# Patient Record
Sex: Male | Born: 1945 | Race: Black or African American | Hispanic: No | Marital: Married | Smoking: Former smoker
Health system: Southern US, Community
[De-identification: ages and names within clinical notes are randomized; demographics above are authoritative.]

## PROBLEM LIST (undated history)

## (undated) DIAGNOSIS — G7000 Myasthenia gravis without (acute) exacerbation: Secondary | ICD-10-CM

## (undated) DIAGNOSIS — I679 Cerebrovascular disease, unspecified: Secondary | ICD-10-CM

## (undated) DIAGNOSIS — G20 Parkinson's disease: Secondary | ICD-10-CM

## (undated) DIAGNOSIS — G2581 Restless legs syndrome: Secondary | ICD-10-CM

## (undated) DIAGNOSIS — G4730 Sleep apnea, unspecified: Secondary | ICD-10-CM

## (undated) DIAGNOSIS — I10 Essential (primary) hypertension: Secondary | ICD-10-CM

## (undated) DIAGNOSIS — E785 Hyperlipidemia, unspecified: Secondary | ICD-10-CM

## (undated) DIAGNOSIS — G20A1 Parkinson's disease without dyskinesia, without mention of fluctuations: Secondary | ICD-10-CM

## (undated) DIAGNOSIS — K5900 Constipation, unspecified: Secondary | ICD-10-CM

## (undated) DIAGNOSIS — I639 Cerebral infarction, unspecified: Secondary | ICD-10-CM

## (undated) DIAGNOSIS — I714 Abdominal aortic aneurysm, without rupture, unspecified: Secondary | ICD-10-CM

## (undated) DIAGNOSIS — R269 Unspecified abnormalities of gait and mobility: Secondary | ICD-10-CM

## (undated) DIAGNOSIS — M5417 Radiculopathy, lumbosacral region: Secondary | ICD-10-CM

## (undated) DIAGNOSIS — R42 Dizziness and giddiness: Secondary | ICD-10-CM

## (undated) DIAGNOSIS — E669 Obesity, unspecified: Secondary | ICD-10-CM

## (undated) HISTORY — DX: Constipation, unspecified: K59.00

## (undated) HISTORY — DX: Hyperlipidemia, unspecified: E78.5

## (undated) HISTORY — DX: Unspecified abnormalities of gait and mobility: R26.9

## (undated) HISTORY — DX: Restless legs syndrome: G25.81

## (undated) HISTORY — PX: APPENDECTOMY: SHX54

## (undated) HISTORY — DX: Cerebrovascular disease, unspecified: I67.9

## (undated) HISTORY — DX: Obesity, unspecified: E66.9

## (undated) HISTORY — DX: Dizziness and giddiness: R42

## (undated) HISTORY — DX: Radiculopathy, lumbosacral region: M54.17

## (undated) HISTORY — DX: Myasthenia gravis without (acute) exacerbation: G70.00

## (undated) HISTORY — DX: Abdominal aortic aneurysm, without rupture, unspecified: I71.40

## (undated) HISTORY — DX: Abdominal aortic aneurysm, without rupture: I71.4

---

## 1975-08-05 HISTORY — PX: FEMUR FRACTURE SURGERY: SHX633

## 1998-07-31 ENCOUNTER — Ambulatory Visit (HOSPITAL_COMMUNITY): Admission: RE | Admit: 1998-07-31 | Discharge: 1998-07-31 | Payer: Self-pay | Admitting: *Deleted

## 1999-05-30 ENCOUNTER — Ambulatory Visit (HOSPITAL_COMMUNITY): Admission: RE | Admit: 1999-05-30 | Discharge: 1999-05-30 | Payer: Self-pay | Admitting: Cardiology

## 1999-05-30 ENCOUNTER — Encounter: Payer: Self-pay | Admitting: Cardiology

## 2000-03-19 ENCOUNTER — Encounter: Payer: Self-pay | Admitting: Cardiology

## 2000-03-19 ENCOUNTER — Encounter: Admission: RE | Admit: 2000-03-19 | Discharge: 2000-03-19 | Payer: Self-pay | Admitting: Cardiology

## 2001-06-25 ENCOUNTER — Encounter: Admission: RE | Admit: 2001-06-25 | Discharge: 2001-06-25 | Payer: Self-pay | Admitting: Urology

## 2001-06-25 ENCOUNTER — Encounter: Payer: Self-pay | Admitting: Urology

## 2002-05-11 ENCOUNTER — Encounter: Payer: Self-pay | Admitting: Cardiology

## 2002-05-11 ENCOUNTER — Encounter: Admission: RE | Admit: 2002-05-11 | Discharge: 2002-05-11 | Payer: Self-pay | Admitting: Cardiology

## 2004-10-26 ENCOUNTER — Emergency Department (HOSPITAL_COMMUNITY): Admission: EM | Admit: 2004-10-26 | Discharge: 2004-10-27 | Payer: Self-pay | Admitting: Emergency Medicine

## 2007-11-18 ENCOUNTER — Encounter: Admission: RE | Admit: 2007-11-18 | Discharge: 2007-11-18 | Payer: Self-pay | Admitting: Cardiology

## 2008-01-29 ENCOUNTER — Inpatient Hospital Stay (HOSPITAL_COMMUNITY): Admission: EM | Admit: 2008-01-29 | Discharge: 2008-02-01 | Payer: Self-pay | Admitting: Emergency Medicine

## 2008-01-30 ENCOUNTER — Encounter: Payer: Self-pay | Admitting: Internal Medicine

## 2008-01-30 ENCOUNTER — Ambulatory Visit: Payer: Self-pay | Admitting: Vascular Surgery

## 2009-05-16 ENCOUNTER — Encounter: Admission: RE | Admit: 2009-05-16 | Discharge: 2009-05-16 | Payer: Self-pay | Admitting: Cardiology

## 2009-10-07 ENCOUNTER — Emergency Department (HOSPITAL_COMMUNITY): Admission: EM | Admit: 2009-10-07 | Discharge: 2009-10-07 | Payer: Self-pay | Admitting: Family Medicine

## 2009-11-08 ENCOUNTER — Encounter: Admission: RE | Admit: 2009-11-08 | Discharge: 2009-12-06 | Payer: Self-pay | Admitting: Neurology

## 2010-04-04 ENCOUNTER — Emergency Department (HOSPITAL_COMMUNITY): Admission: EM | Admit: 2010-04-04 | Discharge: 2010-04-04 | Payer: Self-pay | Admitting: Family Medicine

## 2010-10-08 ENCOUNTER — Other Ambulatory Visit: Payer: Self-pay | Admitting: Cardiology

## 2010-10-08 ENCOUNTER — Ambulatory Visit
Admission: RE | Admit: 2010-10-08 | Discharge: 2010-10-08 | Disposition: A | Discharge status: Home / Self Care | Payer: 59 | Source: Ambulatory Visit | Attending: Cardiology | Admitting: Cardiology

## 2010-10-08 DIAGNOSIS — I639 Cerebral infarction, unspecified: Secondary | ICD-10-CM

## 2010-10-08 DIAGNOSIS — I10 Essential (primary) hypertension: Secondary | ICD-10-CM

## 2010-10-29 ENCOUNTER — Other Ambulatory Visit: Payer: Self-pay | Admitting: Gastroenterology

## 2010-12-17 NOTE — Consult Note (Signed)
[name], [name]                   ACCOUNT NO.:  [id number]   MEDICAL RECORD NO.:  [id number]          PATIENT TYPE:  INP   LOCATION:  3006                         FACILITY:  [facility]   PHYSICIAN:  Derek Harrell, M.D.  DATE OF BIRTH:  15-Feb-1946   DATE OF CONSULTATION:  01/31/2008  DATE OF DISCHARGE:                                 CONSULTATION   HISTORY OF PRESENT ILLNESS:  Derek Harrell is a 65 year old, right-handed  black male, born on Jul 05, 1946 with a history of hypertension and  dyslipidemia.  This patient was in his usual state of health until January 28, 2008.  The patient awakened feeling somewhat dizzy and felt  sluggish.  The patient went to work but was noted to have slurred speech  and was told to go back home.  The patient laid down and rested that [place]  but daughter persisted he go see a doctor.  The patient was seen by his  doctor and was sent to the emergency room for an evaluation and was  admitted for evaluation of stroke.  MRI scan of the brain has confirmed  evidence of a right paramedian pontine stroke event.  The patient has  had an echocardiogram revealing 55%-65% ejection fraction, otherwise  unremarkable.  Carotid Doppler study was unremarkable.  The patient has  been placed on Aggrenox and is on medications for blood pressure.  The  patient at this point feels completely normal.  He does not feel like he  has any residual deficits.  Neurology was asked to see the patient for  further evaluation.   PAST MEDICAL HISTORY:  1. New-onset right paramedian pontine infarct.  2. Hypertension.  3. Dyslipidemia.  4. Right femur fracture status post surgery.  5. Appendectomy.   ALLERGIES:  The patient has no known allergies.   He does not smoke.  Drinks alcohol on occasion.   CURRENT MEDICATIONS:  At this point include:  1. Tekturna 300 mg daily.  2. Norvasc 10 mg daily.  3. Aggrenox 1 twice daily.  4. Hydrochlorothiazide 25 mg daily.  5. Lopressor 12.5 mg  twice daily.  6. Benicar 40 mg daily.  7. Potassium 20 mEq twice daily.  8. Crestor 5 mg daily.  9. Tylenol if needed.  10.Catapres 0.1 mg every 2 hours if needed.   The patient was not on antiplatelet agents prior to admission.   SOCIAL HISTORY:  The patient is single/divorced.  The patient [place] in  the [place] area.  He works for [employer] and is  currently working.  The patient has 3 children who are alive and well.   FAMILY MEDICAL HISTORY:  Notable that mother died at age 50 of old  age.  Father died with MI.  The patient's one brother died with an MI.  Four living brothers, one with colon cancer and one living sister who is  in good health.   REVIEW OF SYSTEMS:  Notable for no recent fevers or chills.  The patient  denies headache, neck pain, shortness of breath, palpitations of the  heart,  chest pains, abdominal pain, nausea, or vomiting.  He denies  problems controlling bowels or bladder.  He denies any blackout  episodes,  seizure-type events, or focal numbness or weakness on the  face, arms, or legs.   PHYSICAL EXAMINATION:  VITAL SIGNS:  Blood pressure currently is 152/93,  heart rate 68, respiratory 20, and temperature afebrile.  GENERAL:  This patient is a fairly well-developed black male who is  alert and cooperative at the time of examination.  HEENT:  Head is atraumatic.  Eyes; pupils are equal, round, and react to  light.  Disks are flat bilaterally.  NECK:  Supple.  No carotid bruits noted.  RESPIRATORY:  Clear.  CARDIOVASCULAR:  Regular rate and rhythm.  No obvious murmurs or rubs  noted.  EXTREMITIES:  Without significant edema.  NEUROLOGIC:  Cranial nerves as above.  Facial symmetry is present.  The  patient has good sensation of face to pinprick and soft touch  bilaterally.  He has good strength of facial muscles, motion of the head  turn, and shoulder shrug bilaterally.  Speech is well enunciated, not  aphasic.  The extraocular  movements are full.  Motor testing shows 5/5  strength in all 4's.  Good symmetric motor tone is noted throughout.  Sensory testing is intact to pinprick, soft touch, and vibratory  sensation throughout.  The patient has good finger-to-nose-to-finger and  heel-to-shin.  Gait was not tested.  No drift is seen in the arms or  legs.  Deep tendon reflexes reveal slight elevation of left biceps  reflex, the right otherwise symmetric throughout.  Toes are neutral  bilaterally.  No evidence of extinction is noted.   NIH stroke scale score is 0.   LABORATORY VALUES:  Notable for sodium 141, potassium 3.6, chloride of  104, CO2 of 25, glucose 101, BUN of 11, creatinine 0.76, total bilirubin  of 1.0, alkaline phosphatase is 43, SGOT of 15, SGPT of 11, albumin 3.2,  calcium of 8.7, cholesterol 202, triglycerides of 72, HDL of 31, LDL of  157, and VLDL of 14.  PSA of 1.47.   MRI study is as above.  The patient does have some small vessel ischemic  changes that are chronic over and above this.  MRI angiogram was not  done.   IMPRESSION:  1. Right paramedian pontine infarct.  2. Hypertension.  3. Dyslipidemia.   This patient has high blood pressure as the main risk factor.  The  patient is a remote smoker but does not currently smoke.  Infarcts in  this area oftentimes due to hypertensive changes with vessel disease.  Needs to pursue some evaluation of the vertebrobasilar system which has  not yet been done.   PLAN:  1. MRI angiogram of the intracranial and extracranial vessels.  2. Okay to continue Aggrenox, but the patient was on no antiplatelet      agents prior to admission.  The patient to just go on aspirin upon      discharge.  We will follow this patient while in-house.  The      patient is not a TPA candidate due to duration of symptoms and      minimal deficit.      Derek Harrell, M.D.  Electronically Signed     CKW/MEDQ  D:  01/31/2008  T:  02/01/2008  Job:  [id number]    cc:   Osvaldo Shipper. Spruill, M.D.  [facility]

## 2010-12-17 NOTE — H&P (Signed)
Derek Harrell, Derek Harrell                   ACCOUNT NO.:  [id number]   MEDICAL RECORD NO.:  [id number]          PATIENT TYPE:  INP   LOCATION:  3304                         FACILITY:  [facility]   PHYSICIAN:  Eric L. August Saucer, M.D.     DATE OF BIRTH:  August 15, 1945   DATE OF ADMISSION:  01/28/2008  DATE OF DISCHARGE:                              HISTORY & PHYSICAL   CHIEF COMPLAINT:  New-onset weakness with slurred speech, rule out CVA.   HISTORY OF PRESENT ILLNESS:  First recent [facility] admission  for this 64 year old single black male with previous document history of  hypertension of unknown duration.  He states he had been doing well up  until yesterday when he first began to feel strange in his head.  This  is a mild right-headed sensation.  There was no associated headache.  No  dizziness.  Today, he awakened feeling much worse.  He had some  difficulty concentrating.  The family noted that he had slurring of the  speech as well.  He during that time had more weakness in the left upper  extremity and possibly at the left lower extremity as well.  The patient  did not go to the emergency room for evaluation initially but they come  in later this evening for further care.   PAST MEDICAL HISTORY:  He was notable for hypertension with marked  elevation in his pain seen on documentations previously.  He states that  he had been taking clonidine for several years.  He states he just  recently started on the Tekturna and Exforge within the past 3 weeks.   ALLERGIES:  The patient has no known allergies.   He previously smoked for 1-1/2 pack of cigarettes a Payes, but stopped 15  years ago.  No alcohol intake as well.   PREVIOUS SURGERIES:  Appendectomy and a right leg fracture.   PRESENT MEDICATIONS:  1. Clonidine 0.2 mg nightly.  2. Tekturna 300/25 one daily.  3. Exforge 10 mg daily.   PHYSICAL EXAMINATION:  GENERAL: He is a well-developed, well-nourished  black male apparently in no  acute distress.  VITAL SIGNS: Reveal a blood pressure of 186/104 with initial  presentation of 192/113, pulse 64, and respiratory rate 18.  He is  afebrile.  HEENT: Head normocephalic, atraumatic without bruits.  Extraocular  muscles are intact without diplopia noted.  Pupils are equal and  reactive to light.  There was no sinus tenderness noted.  TMs are clear.  Posterior pharynx is clear.  Tongue is midline.  LUNGS: Clear to auscultation.  No E to A changes.  CARDIOVASCULAR: Normal S1 and S2.  No S3.  1/6 systolic ejection murmur  heard loudest in the left sternal border.  No carotid bruits could be  appreciated on exam today.  ABDOMEN: Bowel sounds present.  No masses.  EXTREMITIES: Trace edema and negative Homans bilaterally.  He does  appear to have decreased dorsiflexion in the left foot versus right.  There is a mild decreasing grasp with the left hand versus right as  well.  The patient notably does serial 3s poorly despite stating that he was  good at maths.  He has a mild left upper extremity drift on testing.  Decreased dorsiflexion left foot versus right.  Absent Babinski's.   LABORATORY DATA:  CBC revealed WBC of 7800, hemoglobin 13.4, hematocrit  of 40.  Normal differential.  Electrolytes, sodium 142, potassium  borderline low at 0.5, chloride 109, CO2 of 10, creatinine of 1.0,  glucose of 94, and calcium 1.07.  Ionized hemoglobin 13.3, hematocrit of  39.0.   CT scan of head demonstrated chronic small vessel disease without any  acute intracranial abnormality.   IMPRESSION:  1. Altered mental status with slurred speech, rule out cerebrovascular      accident versus transient ischemic attack versus reversible      ischemic neurologic deficit.  By history is presently improved.  2. Accelerated hypertension.  Unclear history of how long his blood      pressure has been in this range.  All previous records known shows      elevated blood pressure at that time as well.   The patient recently      started on 2 medications.  3. Distant history of tobacco abuse.  It is unknown what his lipid      status is as well.   PLAN:  The patient is admitted for further treatment for transient  ischemic attack versus cerebrovascular accident.  He will need an MRI  scan of his head to document for any deficits.  We will maintain him on  aspirin therapy as well as his previous blood pressure medicine.  We  used clonidine for more acute elevations.  We will have the patient  evaluated by Neuro and the Stroke Team as well.  We will watch the  patient closely in ICU/Stepdown for the first 24 hours with transfer to  the floor thereafter.           ______________________________  Lind Guest. August Saucer, M.D.     ELD/MEDQ  D:  01/29/2008  T:  01/29/2008  Job:  [id number]

## 2010-12-20 NOTE — Discharge Summary (Signed)
Derek Harrell, Derek Harrell                   ACCOUNT NO.:  [id number]   MEDICAL RECORD NO.:  [id number]         PATIENT TYPE:  CINP   LOCATION:                               FACILITY:  [facility]   PHYSICIAN:  Osvaldo Shipper. Spruill, M.D.DATE OF BIRTH:  1945/11/01   DATE OF ADMISSION:  01/29/2008  DATE OF DISCHARGE:  02/01/2008                               DISCHARGE SUMMARY   DISCHARGE DIAGNOSES:  1. Cerebral artery occlusion with cerebral infarction.  2. Peripheral vascular disease.  3. Hypertension.  4. History of tobacco use.  5. Hyperlipidemia.   Derek Harrell is a 65 year old patient who presented to the [facility] Emergency Department on January 28, 2008 with stroke symptoms.  The patient reported that he woke up feeling weak.  He reported for his  work duty at approximately 7:59 in the morning, and coworkers noticed  that his speech was somewhat slurred.  His daughter spoke with the  patient approximately at 9:30 in the morning, and the patient was again  having slurred speech that was worse at that particular time.  Family  also reported that the patient's ambulation was somewhat slow.   The patient was subsequently admitted due to his altered mental status.  The admission history and physical was completed by Dr. Willey Blade.  The  daily visits were completed by Dr. Donia Guiles.  It is of note that  the initial blood pressure for this patient was initial was 192/113 and  ranged to 186/104.  The patient did serial 3's poorly on his neurologic  examination.  He had a mild left upper extremity drift on testing.  There was decreased dorsiflexion of the left foot versus the right.   Plans were made for the patient to have an MRI which revealed an acute  nonhemorrhagic left paracentral pontine infarct.   At this point, the patient was seen by the stroke team.  The patient  also underwent carotid Doppler study, and there was no internal carotid  artery stenosis.  Vertebral artery flow was  antegrade.   A neurology consultation was obtained and it was their opinion that the  patient had indeed sustained a right pontine cerebrovascular accident  with ataxia.  The patient was placed on Aggrenox.  The neurology  consultants were in agreement with this.  The patient continued to show  improvement without major complications, and plans were made for the  patient to be seen in the office in 2 weeks.   Medications at discharge include clonidine 0.1 mg at bedtime, potassium  20 mEq daily, Crestor 10 mg daily, Bystolic 10 mg daily, Aggrenox 1  twice a Dengel, Tekturna 300 mg daily, and Exforge HCT 10/320/25 one daily.   The patient is to be seen by Dr. Anne Hahn in 2 months (neurology), to be  seen by Dr. Shana Chute in 2 weeks, sooner if any changes, problems, or  concerns.      Derek Harrell, P.A.       ______________________________  Osvaldo Shipper. Spruill, M.D.    HB/MEDQ  D:  03/02/2008  T:  03/03/2008  Job:  [id number]

## 2011-05-01 LAB — POCT I-STAT, CHEM 8
BUN: 10
Calcium, Ion: 1.07 — ABNORMAL LOW
Chloride: 109
Glucose, Bld: 94
HCT: 39
TCO2: 23

## 2011-05-01 LAB — BASIC METABOLIC PANEL
CO2: 25
Chloride: 104
Creatinine, Ser: 0.76
GFR calc Af Amer: >60
Potassium: 3.6
Sodium: 141

## 2011-05-01 LAB — LIPID PANEL
Cholesterol: 227 — ABNORMAL HIGH
LDL Cholesterol: 166 — ABNORMAL HIGH
Triglycerides: 105
VLDL: 14

## 2011-05-01 LAB — COMPREHENSIVE METABOLIC PANEL
ALT: 11
AST: 15
CO2: 27
Chloride: 104
GFR calc Af Amer: >60
GFR calc non Af Amer: >60
Glucose, Bld: 109 — ABNORMAL HIGH
Sodium: 139
Total Bilirubin: 1

## 2011-05-01 LAB — CBC
HCT: 40
Hemoglobin: 13.4
MCHC: 33.6
RBC: 4.93
RDW: 13.9

## 2011-05-01 LAB — DIFFERENTIAL
Basophils Absolute: 0
Basophils Relative: 0
Eosinophils Relative: 5
Monocytes Absolute: 0.5
Monocytes Relative: 7
Neutro Abs: 4.6

## 2011-05-01 LAB — B-NATRIURETIC PEPTIDE (CONVERTED LAB): Pro B Natriuretic peptide (BNP): <30

## 2011-05-01 LAB — PSA: PSA: 1.47

## 2011-05-01 LAB — SEDIMENTATION RATE: Sed Rate: 3

## 2011-05-01 LAB — C-REACTIVE PROTEIN: CRP: 0.2 — ABNORMAL LOW (ref <0.6)

## 2011-05-09 ENCOUNTER — Emergency Department (HOSPITAL_COMMUNITY): Payer: 59

## 2011-05-09 ENCOUNTER — Emergency Department (HOSPITAL_COMMUNITY)
Admission: EM | Admit: 2011-05-09 | Discharge: 2011-05-09 | Disposition: A | Discharge status: Home / Self Care | Payer: 59 | Attending: Emergency Medicine | Admitting: Emergency Medicine

## 2011-05-09 DIAGNOSIS — Z79899 Other long term (current) drug therapy: Secondary | ICD-10-CM | POA: Insufficient documentation

## 2011-05-09 DIAGNOSIS — I10 Essential (primary) hypertension: Secondary | ICD-10-CM | POA: Insufficient documentation

## 2011-05-09 DIAGNOSIS — Z8673 Personal history of transient ischemic attack (TIA), and cerebral infarction without residual deficits: Secondary | ICD-10-CM | POA: Insufficient documentation

## 2011-05-09 DIAGNOSIS — F411 Generalized anxiety disorder: Secondary | ICD-10-CM | POA: Insufficient documentation

## 2011-05-09 DIAGNOSIS — R5381 Other malaise: Secondary | ICD-10-CM | POA: Insufficient documentation

## 2011-05-09 LAB — POCT I-STAT, CHEM 8
BUN: 13 mg/dL (ref 6–23)
Calcium, Ion: 1.15 mmol/L (ref 1.12–1.32)
Chloride: 107 mEq/L (ref 96–112)
Creatinine, Ser: 0.9 mg/dL (ref 0.50–1.35)
Glucose, Bld: 114 mg/dL — ABNORMAL HIGH (ref 70–99)
HCT: 46 % (ref 39.0–52.0)
Hemoglobin: 15.6 g/dL (ref 13.0–17.0)
Potassium: 4 meq/L (ref 3.5–5.1)
Sodium: 142 meq/L (ref 135–145)
TCO2: 25 mmol/L (ref 0–100)

## 2011-05-09 LAB — DIFFERENTIAL
Basophils Absolute: 0 K/uL (ref 0.0–0.1)
Basophils Relative: 1 % (ref 0–1)
Eosinophils Absolute: 0.3 10*3/uL (ref 0.0–0.7)
Eosinophils Relative: 4 % (ref 0–5)
Lymphocytes Relative: 22 % (ref 12–46)
Lymphs Abs: 1.4 10*3/uL (ref 0.7–4.0)
Monocytes Absolute: 0.4 10*3/uL (ref 0.1–1.0)
Monocytes Relative: 6 % (ref 3–12)
Neutro Abs: 4.6 K/uL (ref 1.7–7.7)
Neutrophils Relative %: 68 % (ref 43–77)

## 2011-05-09 LAB — CBC
HCT: 43.5 % (ref 39.0–52.0)
Hemoglobin: 14.9 g/dL (ref 13.0–17.0)
MCH: 26.4 pg (ref 26.0–34.0)
MCHC: 34.3 g/dL (ref 30.0–36.0)
MCV: 77 fL — ABNORMAL LOW (ref 78.0–100.0)
Platelets: 165 10*3/uL (ref 150–400)
RBC: 5.65 MIL/uL (ref 4.22–5.81)
RDW: 13.5 % (ref 11.5–15.5)
WBC: 6.7 K/uL (ref 4.0–10.5)

## 2011-06-21 ENCOUNTER — Other Ambulatory Visit: Payer: Self-pay | Admitting: Cardiology

## 2011-10-07 ENCOUNTER — Other Ambulatory Visit: Payer: Self-pay | Admitting: Cardiology

## 2011-12-17 ENCOUNTER — Other Ambulatory Visit (HOSPITAL_COMMUNITY): Payer: Self-pay | Admitting: Cardiology

## 2011-12-22 ENCOUNTER — Other Ambulatory Visit: Payer: Self-pay

## 2011-12-22 ENCOUNTER — Ambulatory Visit (HOSPITAL_COMMUNITY)
Admission: RE | Admit: 2011-12-22 | Discharge: 2011-12-22 | Disposition: A | Discharge status: Home / Self Care | Payer: 59 | Source: Ambulatory Visit | Attending: Cardiology | Admitting: Cardiology

## 2011-12-22 ENCOUNTER — Encounter (HOSPITAL_COMMUNITY)
Admission: RE | Admit: 2011-12-22 | Discharge: 2011-12-22 | Disposition: A | Discharge status: Home / Self Care | Payer: 59 | Source: Ambulatory Visit | Attending: Cardiology | Admitting: Cardiology

## 2011-12-22 DIAGNOSIS — I10 Essential (primary) hypertension: Secondary | ICD-10-CM | POA: Insufficient documentation

## 2011-12-22 DIAGNOSIS — Z8679 Personal history of other diseases of the circulatory system: Secondary | ICD-10-CM | POA: Insufficient documentation

## 2011-12-22 DIAGNOSIS — R5381 Other malaise: Secondary | ICD-10-CM | POA: Insufficient documentation

## 2011-12-22 MED ORDER — REGADENOSON 0.4 MG/5ML IV SOLN
INTRAVENOUS | Status: AC
  Filled 2011-12-22: qty 5

## 2011-12-22 MED ORDER — TECHNETIUM TC 99M TETROFOSMIN IV KIT
30.0000 | PACK | Freq: Once | INTRAVENOUS | Status: AC | PRN
  Administered 2011-12-22: 30 via INTRAVENOUS

## 2011-12-22 MED ORDER — TECHNETIUM TC 99M TETROFOSMIN IV KIT
10.0000 | PACK | Freq: Once | INTRAVENOUS | Status: AC | PRN
  Administered 2011-12-22: 10 via INTRAVENOUS

## 2011-12-22 MED ORDER — REGADENOSON 0.4 MG/5ML IV SOLN
0.4000 mg | Freq: Once | INTRAVENOUS | Status: AC
  Administered 2011-12-22: 0.4 mg via INTRAVENOUS

## 2012-09-06 ENCOUNTER — Other Ambulatory Visit: Payer: Self-pay | Admitting: Physician Assistant

## 2012-09-06 DIAGNOSIS — K469 Unspecified abdominal hernia without obstruction or gangrene: Secondary | ICD-10-CM

## 2012-09-06 DIAGNOSIS — R109 Unspecified abdominal pain: Secondary | ICD-10-CM

## 2012-09-08 ENCOUNTER — Ambulatory Visit
Admission: RE | Admit: 2012-09-08 | Discharge: 2012-09-08 | Disposition: A | Discharge status: Home / Self Care | Payer: 59 | Source: Ambulatory Visit | Attending: Physician Assistant | Admitting: Physician Assistant

## 2012-09-08 DIAGNOSIS — K469 Unspecified abdominal hernia without obstruction or gangrene: Secondary | ICD-10-CM

## 2012-09-08 DIAGNOSIS — R109 Unspecified abdominal pain: Secondary | ICD-10-CM

## 2012-09-08 MED ORDER — IOHEXOL 300 MG/ML  SOLN
125.0000 mL | Freq: Once | INTRAMUSCULAR | Status: AC | PRN
  Administered 2012-09-08: 125 mL via INTRAVENOUS

## 2012-10-05 ENCOUNTER — Other Ambulatory Visit (HOSPITAL_COMMUNITY): Payer: Self-pay | Admitting: Cardiovascular Disease

## 2012-10-05 DIAGNOSIS — I714 Abdominal aortic aneurysm, without rupture, unspecified: Secondary | ICD-10-CM

## 2012-10-29 ENCOUNTER — Encounter (HOSPITAL_COMMUNITY): Payer: Self-pay

## 2012-11-21 ENCOUNTER — Emergency Department (HOSPITAL_COMMUNITY): Payer: Medicare Other

## 2012-11-21 ENCOUNTER — Inpatient Hospital Stay (HOSPITAL_COMMUNITY)
Admission: EM | Admit: 2012-11-21 | Discharge: 2012-11-23 | DRG: 312 | Disposition: A | Discharge status: Home / Self Care | Payer: Medicare Other | Attending: Cardiology | Admitting: Cardiology

## 2012-11-21 ENCOUNTER — Encounter (HOSPITAL_COMMUNITY): Payer: Self-pay | Admitting: Emergency Medicine

## 2012-11-21 DIAGNOSIS — E7800 Pure hypercholesterolemia, unspecified: Secondary | ICD-10-CM | POA: Diagnosis present

## 2012-11-21 DIAGNOSIS — H02409 Unspecified ptosis of unspecified eyelid: Secondary | ICD-10-CM | POA: Diagnosis present

## 2012-11-21 DIAGNOSIS — R270 Ataxia, unspecified: Secondary | ICD-10-CM

## 2012-11-21 DIAGNOSIS — Z79899 Other long term (current) drug therapy: Secondary | ICD-10-CM

## 2012-11-21 DIAGNOSIS — R279 Unspecified lack of coordination: Secondary | ICD-10-CM

## 2012-11-21 DIAGNOSIS — G20 Parkinson's disease: Secondary | ICD-10-CM | POA: Diagnosis present

## 2012-11-21 DIAGNOSIS — R531 Weakness: Secondary | ICD-10-CM | POA: Diagnosis present

## 2012-11-21 DIAGNOSIS — G20A1 Parkinson's disease without dyskinesia, without mention of fluctuations: Secondary | ICD-10-CM | POA: Diagnosis present

## 2012-11-21 DIAGNOSIS — I10 Essential (primary) hypertension: Secondary | ICD-10-CM | POA: Diagnosis present

## 2012-11-21 DIAGNOSIS — Z8673 Personal history of transient ischemic attack (TIA), and cerebral infarction without residual deficits: Secondary | ICD-10-CM

## 2012-11-21 DIAGNOSIS — Z7902 Long term (current) use of antithrombotics/antiplatelets: Secondary | ICD-10-CM

## 2012-11-21 DIAGNOSIS — R55 Syncope and collapse: Principal | ICD-10-CM | POA: Diagnosis present

## 2012-11-21 DIAGNOSIS — R5381 Other malaise: Secondary | ICD-10-CM

## 2012-11-21 HISTORY — DX: Cerebral infarction, unspecified: I63.9

## 2012-11-21 HISTORY — DX: Essential (primary) hypertension: I10

## 2012-11-21 LAB — BASIC METABOLIC PANEL
BUN: 13 mg/dL (ref 6–23)
CO2: 23 mEq/L (ref 19–32)
Calcium: 9.3 mg/dL (ref 8.4–10.5)
Chloride: 105 mEq/L (ref 96–112)
Creatinine, Ser: 0.82 mg/dL (ref 0.50–1.35)
GFR calc Af Amer: >90 mL/min (ref >90)
GFR calc non Af Amer: >90 mL/min (ref >90)
Glucose, Bld: 87 mg/dL (ref 70–99)
Potassium: 4.6 mEq/L (ref 3.5–5.1)
Sodium: 139 mEq/L (ref 135–145)

## 2012-11-21 LAB — URINALYSIS, ROUTINE W REFLEX MICROSCOPIC
Bilirubin Urine: NEGATIVE
Hgb urine dipstick: NEGATIVE
Ketones, ur: NEGATIVE mg/dL
Protein, ur: NEGATIVE mg/dL
Urobilinogen, UA: 2 mg/dL — ABNORMAL HIGH (ref 0.0–1.0)

## 2012-11-21 LAB — DIFFERENTIAL
Basophils Absolute: 0 10*3/uL (ref 0.0–0.1)
Eosinophils Relative: 4 % (ref 0–5)
Lymphocytes Relative: 20 % (ref 12–46)
Monocytes Absolute: 0.7 10*3/uL (ref 0.1–1.0)
Monocytes Relative: 8 % (ref 3–12)

## 2012-11-21 LAB — CBC
HCT: 44.2 % (ref 39.0–52.0)
Hemoglobin: 15.7 g/dL (ref 13.0–17.0)
MCH: 27.1 pg (ref 26.0–34.0)
MCHC: 35.5 g/dL (ref 30.0–36.0)
MCV: 76.3 fL — ABNORMAL LOW (ref 78.0–100.0)
Platelets: 152 10*3/uL (ref 150–400)
RBC: 5.79 MIL/uL (ref 4.22–5.81)
RDW: 13.4 % (ref 11.5–15.5)
WBC: 8.4 10*3/uL (ref 4.0–10.5)

## 2012-11-21 LAB — POCT I-STAT, CHEM 8
BUN: 14 mg/dL (ref 6–23)
Calcium, Ion: 1.17 mmol/L (ref 1.13–1.30)
Chloride: 106 mEq/L (ref 96–112)
Creatinine, Ser: 0.8 mg/dL (ref 0.50–1.35)
Glucose, Bld: 88 mg/dL (ref 70–99)
HCT: 47 % (ref 39.0–52.0)

## 2012-11-21 LAB — RAPID URINE DRUG SCREEN, HOSP PERFORMED
Amphetamines: NOT DETECTED
Benzodiazepines: NOT DETECTED
Opiates: NOT DETECTED
Tetrahydrocannabinol: NOT DETECTED

## 2012-11-21 MED ORDER — SODIUM CHLORIDE 0.9 % IJ SOLN
3.0000 mL | Freq: Two times a day (BID) | INTRAMUSCULAR | Status: DC
  Administered 2012-11-21 – 2012-11-22 (×2): 3 mL via INTRAVENOUS

## 2012-11-21 MED ORDER — ATORVASTATIN CALCIUM 40 MG PO TABS
40.0000 mg | ORAL_TABLET | Freq: Every day | ORAL | Status: DC
  Administered 2012-11-21 – 2012-11-22 (×2): 40 mg via ORAL
  Filled 2012-11-21 (×4): qty 1

## 2012-11-21 MED ORDER — ONDANSETRON HCL 4 MG/2ML IJ SOLN: 4.0000 mg | Freq: Four times a day (QID) | INTRAMUSCULAR | Status: DC | PRN

## 2012-11-21 MED ORDER — ACETAMINOPHEN 650 MG RE SUPP: 650.0000 mg | Freq: Four times a day (QID) | RECTAL | Status: DC | PRN

## 2012-11-21 MED ORDER — ACETAMINOPHEN 325 MG PO TABS: 650.0000 mg | ORAL_TABLET | Freq: Four times a day (QID) | ORAL | Status: DC | PRN

## 2012-11-21 MED ORDER — HEPARIN SODIUM (PORCINE) 5000 UNIT/ML IJ SOLN
5000.0000 [IU] | Freq: Three times a day (TID) | INTRAMUSCULAR | Status: DC
  Administered 2012-11-21 – 2012-11-23 (×5): 5000 [IU] via SUBCUTANEOUS
  Filled 2012-11-21 (×8): qty 1

## 2012-11-21 MED ORDER — SODIUM CHLORIDE 0.9 % IV SOLN: 250.0000 mL | INTRAVENOUS | Status: DC | PRN

## 2012-11-21 MED ORDER — ADULT MULTIVITAMIN W/MINERALS CH
1.0000 | ORAL_TABLET | Freq: Every day | ORAL | Status: DC
  Administered 2012-11-22 – 2012-11-23 (×2): 1 via ORAL
  Filled 2012-11-21 (×2): qty 1

## 2012-11-21 MED ORDER — NEBIVOLOL HCL 10 MG PO TABS
10.0000 mg | ORAL_TABLET | Freq: Every day | ORAL | Status: DC
  Administered 2012-11-22 – 2012-11-23 (×2): 10 mg via ORAL
  Filled 2012-11-21 (×3): qty 1

## 2012-11-21 MED ORDER — ALUM & MAG HYDROXIDE-SIMETH 200-200-20 MG/5ML PO SUSP: 30.0000 mL | Freq: Four times a day (QID) | ORAL | Status: DC | PRN

## 2012-11-21 MED ORDER — CLOPIDOGREL BISULFATE 75 MG PO TABS
75.0000 mg | ORAL_TABLET | Freq: Every day | ORAL | Status: DC
  Administered 2012-11-22 – 2012-11-23 (×2): 75 mg via ORAL
  Filled 2012-11-21 (×3): qty 1

## 2012-11-21 MED ORDER — SODIUM CHLORIDE 0.9 % IJ SOLN
3.0000 mL | Freq: Two times a day (BID) | INTRAMUSCULAR | Status: DC
  Administered 2012-11-21 – 2012-11-23 (×3): 3 mL via INTRAVENOUS

## 2012-11-21 MED ORDER — ONDANSETRON HCL 4 MG PO TABS: 4.0000 mg | ORAL_TABLET | Freq: Four times a day (QID) | ORAL | Status: DC | PRN

## 2012-11-21 MED ORDER — ASPIRIN EC 325 MG PO TBEC
325.0000 mg | DELAYED_RELEASE_TABLET | Freq: Every day | ORAL | Status: DC
  Administered 2012-11-22 – 2012-11-23 (×2): 325 mg via ORAL
  Filled 2012-11-21 (×2): qty 1

## 2012-11-21 MED ORDER — CLONIDINE HCL 0.1 MG PO TABS
0.1000 mg | ORAL_TABLET | Freq: Every day | ORAL | Status: DC
  Administered 2012-11-21 – 2012-11-22 (×2): 0.1 mg via ORAL
  Filled 2012-11-21 (×4): qty 1

## 2012-11-21 MED ORDER — DOCUSATE SODIUM 100 MG PO CAPS
100.0000 mg | ORAL_CAPSULE | Freq: Two times a day (BID) | ORAL | Status: DC
  Administered 2012-11-21 – 2012-11-22 (×3): 100 mg via ORAL
  Filled 2012-11-21 (×3): qty 1

## 2012-11-21 MED ORDER — SODIUM CHLORIDE 0.9 % IJ SOLN: 3.0000 mL | INTRAMUSCULAR | Status: DC | PRN

## 2012-11-21 MED ORDER — AMLODIPINE BESYLATE 5 MG PO TABS
5.0000 mg | ORAL_TABLET | Freq: Every day | ORAL | Status: DC
  Administered 2012-11-22 – 2012-11-23 (×2): 5 mg via ORAL
  Filled 2012-11-21 (×3): qty 1

## 2012-11-21 NOTE — ED Provider Notes (Signed)
History     CSN: [id number]  Arrival date & time 11/21/12  1209   First MD Initiated Contact with Patient 11/21/12 1217      Chief Complaint  Patient presents with  . Weakness    (Consider location/radiation/quality/duration/timing/severity/associated sxs/prior treatment) HPI Comments: 67 year old male with a history of hypertension and a stroke several years ago who presents with a complaint of having difficulty walking. The patient states that he has had several weeks of exertional dyspnea and chest discomfort as well as a complaint of not being able walk at all this morning without significant assistance. Fellow church members and discharge side he was having difficulty walking and encouraged him to come to the hospital. He has felt generally weak and then flushed this morning. His prior stroke left him with a deficit of feeling dizzy when he closed his eyes but had no difficulty ambulating and a church member who is here with him states that he normally is able to ambulate with a steady gait. She noticed that he was shoveling this morning. The patient is unable to give me the exact timing of the onset of the symptoms and is very vague with his history but states it has been like that all morning. He has had no difficulty with vision, no difficulty with speech and his memory appears to be intact.  Patient is a 67 y.o. male presenting with weakness. The history is provided by the patient.  Weakness    Past Medical History  Diagnosis Date  . Hypertension   . Stroke     History reviewed. No pertinent past surgical history.  History reviewed. No pertinent family history.  History  Substance Use Topics  . Smoking status: Never Smoker   . Smokeless tobacco: Not on file  . Alcohol Use: Yes     Comment: occ      Review of Systems  Neurological: Positive for weakness.  All other systems reviewed and are negative.    Allergies  Review of patient's allergies indicates no  known allergies.  Home Medications   Current Outpatient Rx  Name  Route  Sig  Dispense  Refill  . aliskiren (TEKTURNA) 150 MG tablet   Oral   Take 150 mg by mouth daily.         Marland Kitchen amLODipine (NORVASC) 5 MG tablet   Oral   Take 5 mg by mouth daily.         . cloNIDine (CATAPRES) 0.1 MG tablet   Oral   Take 0.1 mg by mouth at bedtime.         . clopidogrel (PLAVIX) 75 MG tablet   Oral   Take 75 mg by mouth daily.         . nebivolol (BYSTOLIC) 10 MG tablet   Oral   Take 10 mg by mouth daily.         . rosuvastatin (CRESTOR) 10 MG tablet   Oral   Take 10 mg by mouth daily.         . Vitamin D, Ergocalciferol, (DRISDOL) 50000 UNITS CAPS   Oral   Take 50,000 Units by mouth every 7 (seven) days.           BP 155/88  Pulse 68  Temp(Src) 97.7 F (36.5 C) (Oral)  Resp 18  SpO2 98%  Physical Exam  Nursing note and vitals reviewed. Constitutional: He appears well-developed and well-nourished. No distress.  HENT:  Head: Normocephalic and atraumatic.  Mouth/Throat: Oropharynx is clear and  moist. No oropharyngeal exudate.  Eyes: Conjunctivae and EOM are normal. Pupils are equal, round, and reactive to light. Right eye exhibits no discharge. Left eye exhibits no discharge. No scleral icterus.  Neck: Normal range of motion. Neck supple. No JVD present. No thyromegaly present.  Cardiovascular: Normal rate, regular rhythm, normal heart sounds and intact distal pulses.  Exam reveals no gallop and no friction rub.   No murmur heard. Pulmonary/Chest: Effort normal and breath sounds normal. No respiratory distress. He has no wheezes. He has no rales.  Abdominal: Soft. Bowel sounds are normal. He exhibits no distension and no mass. There is no tenderness.  Musculoskeletal: Normal range of motion. He exhibits no edema and no tenderness.  Lymphadenopathy:    He has no cervical adenopathy.  Neurological: He is alert. Coordination normal.  The patient has normal speech,  normal memory, normal coordination of all 4 extremities without any limb ataxia. The patient has normal strength of all 4 extremities at the major muscle groups including shoulders, biceps, triceps, forearm, grips, quadriceps, hamstrings, calves. Normal sensation to light touch and pinprick of all 4 extremities and the trunk. No truncal ataxia, normal gait, cranial nerves III through XII are intact.  Normal peripheral visual fields. Normal extraocular movements. Normal pupillary exam. No pronator drift, normal reflexes at the brachial radialis, biceps and patellar tendons. Normal position sense, normal temperature sensation to cold and warm.  The patient has significant difficulty with ambulating and shuffles his feet, stumbling from side to side as he walks requiring an assistant on each arm for safety  Skin: Skin is warm and dry. No rash noted. No erythema.  Psychiatric: He has a normal mood and affect. His behavior is normal.    ED Course  Procedures (including critical care time)  Labs Reviewed  CBC - Abnormal; Notable for the following:    MCV 76.3 (*)    All other components within normal limits  BASIC METABOLIC PANEL  PROTIME-INR  TROPONIN I  ETHANOL  DIFFERENTIAL  APTT  GLUCOSE, CAPILLARY  URINE RAPID DRUG SCREEN (HOSP PERFORMED)  URINALYSIS, ROUTINE W REFLEX MICROSCOPIC  POCT I-STAT, CHEM 8  POCT I-STAT TROPONIN I   Ct Head Wo Contrast  11/21/2012  *RADIOLOGY REPORT*  Clinical Data: 67 year old male who awoke this morning with generalized weakness.  CT HEAD WITHOUT CONTRAST  Technique:  Contiguous axial images were obtained from the base of the skull through the vertex without contrast.  Comparison: 05/09/2011 and earlier.  Findings: Increased mild to moderate scattered paranasal sinus mucosal thickening.  Mastoids and tympanic cavities remain clear. Osteopenia. No acute osseous abnormality identified.  Visualized orbits and scalp soft tissues are within normal limits.  Calcified  atherosclerosis at the skull base.  Stable and normal cerebral volume.  No ventriculomegaly.  Confluent and patchy cerebral white matter hypodensity not significantly changed. No midline shift, mass effect, or evidence of mass lesion.  No evidence of cortically based acute infarction identified.  No acute intracranial hemorrhage identified.  No suspicious intracranial vascular hyperdensity.  IMPRESSION: Stable age advanced but nonspecific cerebral white matter changes. No acute intracranial abnormality.   Original Report Authenticated By: Erskine Speed, M.D.      1. Ataxia       MDM  The patient has a normal cardiac and pulmonary exam, his vital signs show mild hypertension but his neurologic exam is grossly abnormal with difficulty with walking. This ataxic gait will require evaluation with a stroke workup however he also has symptoms of  exertional symptoms concerning for cardiac sources as well, labs, EKG, CT scan pending.  ED ECG REPORT  I personally interpreted this EKG   Date: 11/21/2012   Rate: 57  Rhythm: sinus bradycardia  QRS Axis: normal  Intervals: normal  ST/T Wave abnormalities: normal  Conduction Disutrbances:none  Narrative Interpretation:   Old EKG Reviewed: Compared with 05/09/2011, rate slower, no other changes  The patient has no obvious findings on his blood workup, EKG or his CT scan to suggest an answer for his symptoms. I have discussed his care with the physician on call, Dr. Algie Coffer who will come to admit.     Vida Roller, MD 11/21/12 1414

## 2012-11-21 NOTE — Consult Note (Signed)
NEURO HOSPITALIST CONSULT NOTE    Reason for Consult: Transient generalized weakness  HPI:                                                                                                                                          Derek Harrell is an 67 y.o. male with a history of stroke and hypertension who was brought to the emergency room following an episode of generalized weakness and feeling of flushing while at church this morning. Patient was noted to be somewhat unsteady with walking, as well. Speech pattern is unchanged except somewhat slowed output. No confusion was reported. Patient had similar symptoms when he suffered a stroke 2-3 years ago. CT scan of his head showed no acute intracranial abnormality. Patient had no focal weakness nor focal numbness with this presenting symptoms. Symptoms lasted about 30 minutes and resolved without specific intervention. He indicates his gait has changed over the past several months. He tends to not pick his feet up as well as he used to. He doesn't report any falls. He has not noticed any tremor of his extremities. Patient has bilateral ptosis, in addition to his complaint of weakness. Neurology consultation was obtained to rule out possible myasthenia gravis as well as gait abnormality.  Past Medical History  Diagnosis Date  . Hypertension   . Stroke     History reviewed. No pertinent past surgical history.  History reviewed. No pertinent family history.   Social History:  reports that he has never smoked. He does not have any smokeless tobacco history on file. He reports that  drinks alcohol. He reports that he does not use illicit drugs.  No Known Allergies  MEDICATIONS:                                                                                                                     Scheduled: . amLODipine  5 mg Oral Daily  . [START ON 11/22/2012] aspirin EC  325 mg Oral Q1200  . atorvastatin  40 mg Oral q1800  .  cloNIDine  0.1 mg Oral QHS  . clopidogrel  75 mg Oral Daily  . docusate sodium  100 mg Oral BID  . heparin  5,000 Units Subcutaneous Q8H  . [START ON 11/22/2012] multivitamin with minerals  1 tablet Oral Q1200  . nebivolol  10 mg Oral Daily  . sodium chloride  3 mL Intravenous Q12H  . sodium chloride  3 mL Intravenous Q12H   Continuous:  WUJ:WJXBJY chloride, acetaminophen, acetaminophen, alum & mag hydroxide-simeth, ondansetron (ZOFRAN) IV, ondansetron, sodium chloride    Blood pressure 140/78, pulse 64, temperature 97.4 F (36.3 C), temperature source Axillary, resp. rate 16, height 6\' 1"  (1.854 m), weight 103.1 kg (227 lb 4.7 oz), SpO2 97.00%.   Neurologic Examination:                                                                                                      Mental Status: Alert, oriented, thought content appropriate.  Speech fluent without evidence of aphasia. Able to follow commands without difficulty. Cranial Nerves: II-Visual fields were normal. III/IV/VI-Pupils were equal and reacted. Extraocular movements were full and conjugate.  Moderate bilateral ptosis of eyelids.  V/VII-no facial numbness and no facial weakness. VIII-normal. X-normal speech and symmetrical palatal movement. XII-midline tongue extension Motor: 5/5 bilaterally with normal tone and bulk Sensory: Normal throughout. Deep Tendon Reflexes: 1+ and symmetric. Plantars: Flexor bilaterally Cerebellar: Normal finger-to-nose testing.   Lab Results  Component Value Date/Time   CHOL  Value: 227        ATP III CLASSIFICATION:  <200     mg/dL   Desirable  782-956  mg/dL   Borderline High  >=213    mg/dL   High* 0/86/5784  6:96 PM    Results for orders placed during the hospital encounter of 11/21/12 (from the past 48 hour(s))  CBC     Status: Abnormal   Collection Time    11/21/12 12:18 PM      Result Value Range   WBC 8.4  4.0 - 10.5 K/uL   RBC 5.79  4.22 - 5.81 MIL/uL   Hemoglobin 15.7  13.0 -  17.0 g/dL   HCT 29.5  28.4 - 13.2 %   MCV 76.3 (*) 78.0 - 100.0 fL   MCH 27.1  26.0 - 34.0 pg   MCHC 35.5  30.0 - 36.0 g/dL   RDW 44.0  10.2 - 72.5 %   Platelets 152  150 - 400 K/uL  BASIC METABOLIC PANEL     Status: None   Collection Time    11/21/12 12:18 PM      Result Value Range   Sodium 139  135 - 145 mEq/L   Potassium 4.6  3.5 - 5.1 mEq/L   Chloride 105  96 - 112 mEq/L   CO2 23  19 - 32 mEq/L   Glucose, Bld 87  70 - 99 mg/dL   BUN 13  6 - 23 mg/dL   Creatinine, Ser 3.66  0.50 - 1.35 mg/dL   Calcium 9.3  8.4 - 44.0 mg/dL   GFR calc non Af Amer >90  >90 mL/min   GFR calc Af Amer >90  >90 mL/min   Comment:            The eGFR has been calculated     using the  CKD EPI equation.     This calculation has not been     validated in all clinical     situations.     eGFR's persistently     <90 mL/min signify     possible Chronic Kidney Disease.  PROTIME-INR     Status: None   Collection Time    11/21/12 12:18 PM      Result Value Range   Prothrombin Time 14.4  11.6 - 15.2 seconds   INR 1.14  0.00 - 1.49  ETHANOL     Status: None   Collection Time    11/21/12 12:18 PM      Result Value Range   Alcohol, Ethyl (B) <11  0 - 11 mg/dL   Comment:            LOWEST DETECTABLE LIMIT FOR     SERUM ALCOHOL IS 11 mg/dL     FOR MEDICAL PURPOSES ONLY  DIFFERENTIAL     Status: None   Collection Time    11/21/12 12:18 PM      Result Value Range   Neutrophils Relative 67  43 - 77 %   Neutro Abs 5.6  1.7 - 7.7 K/uL   Lymphocytes Relative 20  12 - 46 %   Lymphs Abs 1.7  0.7 - 4.0 K/uL   Monocytes Relative 8  3 - 12 %   Monocytes Absolute 0.7  0.1 - 1.0 K/uL   Eosinophils Relative 4  0 - 5 %   Eosinophils Absolute 0.4  0.0 - 0.7 K/uL   Basophils Relative 1  0 - 1 %   Basophils Absolute 0.0  0.0 - 0.1 K/uL  APTT     Status: None   Collection Time    11/21/12 12:18 PM      Result Value Range   aPTT 28  24 - 37 seconds  TROPONIN I     Status: None   Collection Time    11/21/12  12:31 PM      Result Value Range   Troponin I <0.30  <0.30 ng/mL   Comment:            Due to the release kinetics of cTnI,     a negative result within the first hours     of the onset of symptoms does not rule out     myocardial infarction with certainty.     If myocardial infarction is still suspected,     repeat the test at appropriate intervals.  POCT I-STAT TROPONIN I     Status: None   Collection Time    11/21/12 12:52 PM      Result Value Range   Troponin i, poc 0.00  0.00 - 0.08 ng/mL   Comment 3            Comment: Due to the release kinetics of cTnI,     a negative result within the first hours     of the onset of symptoms does not rule out     myocardial infarction with certainty.     If myocardial infarction is still suspected,     repeat the test at appropriate intervals.  POCT I-STAT, CHEM 8     Status: None   Collection Time    11/21/12 12:54 PM      Result Value Range   Sodium 144  135 - 145 mEq/L   Potassium 4.5  3.5 - 5.1 mEq/L   Chloride 106  96 -  112 mEq/L   BUN 14  6 - 23 mg/dL   Creatinine, Ser 1.61  0.50 - 1.35 mg/dL   Glucose, Bld 88  70 - 99 mg/dL   Calcium, Ion 0.96  0.45 - 1.30 mmol/L   TCO2 28  0 - 100 mmol/L   Hemoglobin 16.0  13.0 - 17.0 g/dL   HCT 40.9  81.1 - 91.4 %  GLUCOSE, CAPILLARY     Status: None   Collection Time    11/21/12 12:55 PM      Result Value Range   Glucose-Capillary 85  70 - 99 mg/dL  URINE RAPID DRUG SCREEN (HOSP PERFORMED)     Status: None   Collection Time    11/21/12  4:43 PM      Result Value Range   Opiates NONE DETECTED  NONE DETECTED   Cocaine NONE DETECTED  NONE DETECTED   Benzodiazepines NONE DETECTED  NONE DETECTED   Amphetamines NONE DETECTED  NONE DETECTED   Tetrahydrocannabinol NONE DETECTED  NONE DETECTED   Barbiturates NONE DETECTED  NONE DETECTED   Comment:            DRUG SCREEN FOR MEDICAL PURPOSES     ONLY.  IF CONFIRMATION IS NEEDED     FOR ANY PURPOSE, NOTIFY LAB     WITHIN 5 DAYS.                 LOWEST DETECTABLE LIMITS     FOR URINE DRUG SCREEN     Drug Class       Cutoff (ng/mL)     Amphetamine      1000     Barbiturate      200     Benzodiazepine   200     Tricyclics       300     Opiates          300     Cocaine          300     THC              50  URINALYSIS, ROUTINE W REFLEX MICROSCOPIC     Status: Abnormal   Collection Time    11/21/12  4:43 PM      Result Value Range   Color, Urine YELLOW  YELLOW   APPearance CLEAR  CLEAR   Specific Gravity, Urine 1.013  1.005 - 1.030   pH 7.5  5.0 - 8.0   Glucose, UA NEGATIVE  NEGATIVE mg/dL   Hgb urine dipstick NEGATIVE  NEGATIVE   Bilirubin Urine NEGATIVE  NEGATIVE   Ketones, ur NEGATIVE  NEGATIVE mg/dL   Protein, ur NEGATIVE  NEGATIVE mg/dL   Urobilinogen, UA 2.0 (*) 0.0 - 1.0 mg/dL   Nitrite NEGATIVE  NEGATIVE   Leukocytes, UA NEGATIVE  NEGATIVE   Comment: MICROSCOPIC NOT DONE ON URINES WITH NEGATIVE PROTEIN, BLOOD, LEUKOCYTES, NITRITE, OR GLUCOSE <1000 mg/dL.    Ct Head Wo Contrast  11/21/2012  *RADIOLOGY REPORT*  Clinical Data: 67 year old male who awoke this morning with generalized weakness.  CT HEAD WITHOUT CONTRAST  Technique:  Contiguous axial images were obtained from the base of the skull through the vertex without contrast.  Comparison: 05/09/2011 and earlier.  Findings: Increased mild to moderate scattered paranasal sinus mucosal thickening.  Mastoids and tympanic cavities remain clear. Osteopenia. No acute osseous abnormality identified.  Visualized orbits and scalp soft tissues are within normal limits.  Calcified atherosclerosis at the skull base.  Stable  and normal cerebral volume.  No ventriculomegaly.  Confluent and patchy cerebral white matter hypodensity not significantly changed. No midline shift, mass effect, or evidence of mass lesion.  No evidence of cortically based acute infarction identified.  No acute intracranial hemorrhage identified.  No suspicious intracranial vascular hyperdensity.   IMPRESSION: Stable age advanced but nonspecific cerebral white matter changes. No acute intracranial abnormality.   Original Report Authenticated By: Erskine Speed, M.D.      Assessment/Plan: 1. Etiology for episode of generalized weakness with flushing is unclear. Symptoms are indicative of possible presyncopal state. There was no description of of seizure activity, although partial seizure with autonomic phenomena cannot be completely ruled out.  2. 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  Recommendations: 1. Routine adult EEG 2. Physical therapy evaluation and recommendations regarding patient's gait changes. 3. Vitamin B12 and folate levels  CR Kroy Sprung M.D. [facility] [phone]  11/21/2012, 9:28 PM

## 2012-11-21 NOTE — H&P (Signed)
Derek Harrell is an 67 y.o. male.   Chief Complaint: Weakness HPI: 67 year old male with a history of hypertension and a stroke several years ago who presents with a complaint of having difficulty walking this morning in church. Fellow church members encouraged him to come to the hospital. He has felt generally weak and then flushed this morning. His prior stroke left him with a deficit of feeling dizzy when he closed his eyes but had no difficulty ambulating and a church member who is here with him states that he normally is able to ambulate with a steady gait. She noticed that he was shoveling this morning. The patient is unable to give me the exact timing of the onset of the symptoms and is very vague with his history but states it has been like that all morning. He has had no difficulty with vision, no difficulty with speech and his memory appears to be intact.   Past Medical History  Diagnosis Date  . Hypertension   . Stroke 2009     Past surgical history: Right femue fracture s/p surgery and appendectomy  History reviewed. No pertinent family history. Social History:  reports that he has never smoked. He does not have any smokeless tobacco history on file. He reports that  drinks alcohol. He reports that he does not use illicit drugs.  Allergies: No Known Allergies   (Not in a hospital admission)  Results for orders placed during the hospital encounter of 11/21/12 (from the past 48 hour(s))  CBC     Status: Abnormal   Collection Time    11/21/12 12:18 PM      Result Value Range   WBC 8.4  4.0 - 10.5 K/uL   RBC 5.79  4.22 - 5.81 MIL/uL   Hemoglobin 15.7  13.0 - 17.0 g/dL   HCT 96.0  45.4 - 09.8 %   MCV 76.3 (*) 78.0 - 100.0 fL   MCH 27.1  26.0 - 34.0 pg   MCHC 35.5  30.0 - 36.0 g/dL   RDW 11.9  14.7 - 82.9 %   Platelets 152  150 - 400 K/uL  BASIC METABOLIC PANEL     Status: None   Collection Time    11/21/12 12:18 PM      Result Value Range   Sodium 139  135 - 145 mEq/L    Potassium 4.6  3.5 - 5.1 mEq/L   Chloride 105  96 - 112 mEq/L   CO2 23  19 - 32 mEq/L   Glucose, Bld 87  70 - 99 mg/dL   BUN 13  6 - 23 mg/dL   Creatinine, Ser 5.62  0.50 - 1.35 mg/dL   Calcium 9.3  8.4 - 13.0 mg/dL   GFR calc non Af Amer >90  >90 mL/min   GFR calc Af Amer >90  >90 mL/min   Comment:            The eGFR has been calculated     using the CKD EPI equation.     This calculation has not been     validated in all clinical     situations.     eGFR's persistently     <90 mL/min signify     possible Chronic Kidney Disease.  PROTIME-INR     Status: None   Collection Time    11/21/12 12:18 PM      Result Value Range   Prothrombin Time 14.4  11.6 - 15.2 seconds   INR 1.14  0.00 - 1.49  ETHANOL     Status: None   Collection Time    11/21/12 12:18 PM      Result Value Range   Alcohol, Ethyl (B) <11  0 - 11 mg/dL   Comment:            LOWEST DETECTABLE LIMIT FOR     SERUM ALCOHOL IS 11 mg/dL     FOR MEDICAL PURPOSES ONLY  DIFFERENTIAL     Status: None   Collection Time    11/21/12 12:18 PM      Result Value Range   Neutrophils Relative 67  43 - 77 %   Neutro Abs 5.6  1.7 - 7.7 K/uL   Lymphocytes Relative 20  12 - 46 %   Lymphs Abs 1.7  0.7 - 4.0 K/uL   Monocytes Relative 8  3 - 12 %   Monocytes Absolute 0.7  0.1 - 1.0 K/uL   Eosinophils Relative 4  0 - 5 %   Eosinophils Absolute 0.4  0.0 - 0.7 K/uL   Basophils Relative 1  0 - 1 %   Basophils Absolute 0.0  0.0 - 0.1 K/uL  APTT     Status: None   Collection Time    11/21/12 12:18 PM      Result Value Range   aPTT 28  24 - 37 seconds  TROPONIN I     Status: None   Collection Time    11/21/12 12:31 PM      Result Value Range   Troponin I <0.30  <0.30 ng/mL   Comment:            Due to the release kinetics of cTnI,     a negative result within the first hours     of the onset of symptoms does not rule out     myocardial infarction with certainty.     If myocardial infarction is still suspected,     repeat  the test at appropriate intervals.  POCT I-STAT TROPONIN I     Status: None   Collection Time    11/21/12 12:52 PM      Result Value Range   Troponin i, poc 0.00  0.00 - 0.08 ng/mL   Comment 3            Comment: Due to the release kinetics of cTnI,     a negative result within the first hours     of the onset of symptoms does not rule out     myocardial infarction with certainty.     If myocardial infarction is still suspected,     repeat the test at appropriate intervals.  POCT I-STAT, CHEM 8     Status: None   Collection Time    11/21/12 12:54 PM      Result Value Range   Sodium 144  135 - 145 mEq/L   Potassium 4.5  3.5 - 5.1 mEq/L   Chloride 106  96 - 112 mEq/L   BUN 14  6 - 23 mg/dL   Creatinine, Ser 1.61  0.50 - 1.35 mg/dL   Glucose, Bld 88  70 - 99 mg/dL   Calcium, Ion 0.96  0.45 - 1.30 mmol/L   TCO2 28  0 - 100 mmol/L   Hemoglobin 16.0  13.0 - 17.0 g/dL   HCT 40.9  81.1 - 91.4 %  GLUCOSE, CAPILLARY     Status: None   Collection Time    11/21/12 12:55  PM      Result Value Range   Glucose-Capillary 85  70 - 99 mg/dL   Ct Head Wo Contrast  11/21/2012  *RADIOLOGY REPORT*  Clinical Data: 67 year old male who awoke this morning with generalized weakness.  CT HEAD WITHOUT CONTRAST  Technique:  Contiguous axial images were obtained from the base of the skull through the vertex without contrast.  Comparison: 05/09/2011 and earlier.  Findings: Increased mild to moderate scattered paranasal sinus mucosal thickening.  Mastoids and tympanic cavities remain clear. Osteopenia. No acute osseous abnormality identified.  Visualized orbits and scalp soft tissues are within normal limits.  Calcified atherosclerosis at the skull base.  Stable and normal cerebral volume.  No ventriculomegaly.  Confluent and patchy cerebral white matter hypodensity not significantly changed. No midline shift, mass effect, or evidence of mass lesion.  No evidence of cortically based acute infarction identified.  No  acute intracranial hemorrhage identified.  No suspicious intracranial vascular hyperdensity.  IMPRESSION: Stable age advanced but nonspecific cerebral white matter changes. No acute intracranial abnormality.   Original Report Authenticated By: Erskine Speed, M.D.     REVIEW OF SYSTEMS: Notable for no recent fevers or chills. The patient denies headache, neck pain, shortness of breath, palpitations of the heart, chest pains, abdominal pain, nausea, or vomiting. He denies problems controlling bowels or bladder. He denies any blackout episodes, seizure-type events, or focal numbness or weakness on the face, arms, or legs.  Blood pressure 155/88, pulse 68, temperature 97.7 F (36.5 C), temperature source Oral, resp. rate 18, SpO2 98.00%. GENERAL: This patient is a fairly well-developed black male who is alert and cooperative at the time of examination.  HEENT: Head is atraumatic. Eyes: Manson Passey, pupils are equal, round, and react to light.   NECK: Supple. No carotid bruits noted.  RESPIRATORY: Clear, bilaterally.  CARDIOVASCULAR: Regular rate and rhythm. No obvious murmurs or rubs noted.  EXTREMITIES: Without significant edema.  NEUROLOGIC: Cranial nerves as above. Facial symmetry is present. He has good strength of facial muscles, motion of the head turn, and shoulder shrug bilaterally. Speech is well enunciated, not  aphasic. The extraocular movements are full. Motor testing shows 5/5 strength in all 4 ext. Good symmetric motor tone is noted throughout. The patient has good finger-to-nose-to-finger and heel-to-shin. Gait slow and unsteady with turning.    Assessment/Plan Weakness and gait abnormality. R/o new stroke Possible myasthenia gravis Hypertension Dyslipidemia  Neuro-consult MRI/MRA head. Home medications  Michelene Keniston S 11/21/2012, 2:53 PM

## 2012-11-21 NOTE — ED Notes (Signed)
Pt sts woke up this am not feeling well with generalized weakness and flushing that has been getting worse; pt sts hx of similar with stroke in past; no obvious neuro deficits noted; grip strength equal

## 2012-11-22 LAB — CBC
MCH: 26.8 pg (ref 26.0–34.0)
MCHC: 35.4 g/dL (ref 30.0–36.0)
MCV: 75.9 fL — ABNORMAL LOW (ref 78.0–100.0)
Platelets: 148 10*3/uL — ABNORMAL LOW (ref 150–400)
RBC: 5.44 MIL/uL (ref 4.22–5.81)
RDW: 13.2 % (ref 11.5–15.5)

## 2012-11-22 LAB — BASIC METABOLIC PANEL
CO2: 26 mEq/L (ref 19–32)
Calcium: 8.9 mg/dL (ref 8.4–10.5)
Creatinine, Ser: 0.79 mg/dL (ref 0.50–1.35)
GFR calc non Af Amer: >90 mL/min (ref >90)
Glucose, Bld: 97 mg/dL (ref 70–99)
Sodium: 141 mEq/L (ref 135–145)

## 2012-11-22 MED ORDER — CARBIDOPA-LEVODOPA CR 25-100 MG PO TBCR
1.0000 | EXTENDED_RELEASE_TABLET | Freq: Three times a day (TID) | ORAL | Status: DC
  Administered 2012-11-22 – 2012-11-23 (×3): 1 via ORAL
  Filled 2012-11-22 (×8): qty 1

## 2012-11-22 NOTE — Progress Notes (Signed)
Subjective:  Patient denies any chest pain or shortness of breath. Denies any weakness in the arms or legs. State speech has improved Objective:  Vital Signs in the last 24 hours: Temp:  [97.4 F (36.3 C)-97.9 F (36.6 C)] 97.9 F (36.6 C) (04/21 1008) Pulse Rate:  [50-70] 70 (04/21 1008) Resp:  [16-22] 18 (04/21 1008) BP: (119-171)/(77-111) 132/82 mmHg (04/21 1008) SpO2:  [96 %-100 %] 100 % (04/21 1008) Weight:  [101.3 kg (223 lb 5.2 oz)-103.1 kg (227 lb 4.7 oz)] 101.3 kg (223 lb 5.2 oz) (04/21 0529)  Intake/Output from previous [place]:   Intake/Output from this shift: Total I/O In: 360 [P.O.:360] Out: -   Physical Exam: Neck: no adenopathy, no carotid bruit, no JVD and supple, symmetrical, trachea midline Lungs: clear to auscultation bilaterally Heart: regular rate and rhythm, S1, S2 normal and Soft systolic murmur noted no S3 gallop Abdomen: soft, non-tender; bowel sounds normal; no masses,  no organomegaly Extremities: extremities normal, atraumatic, no cyanosis or edema  Lab Results:  Recent Labs  11/21/12 1218 11/21/12 1254 11/22/12 0600  WBC 8.4  --  7.3  HGB 15.7 16.0 14.6  PLT 152  --  148*    Recent Labs  11/21/12 1218 11/21/12 1254 11/22/12 0600  NA 139 144 141  K 4.6 4.5 3.6  CL 105 106 106  CO2 23  --  26  GLUCOSE 87 88 97  BUN 13 14 11   CREATININE 0.82 0.80 0.79    Recent Labs  11/21/12 1231  TROPONINI <0.30   Hepatic Function Panel No results found for this basename: PROT, ALBUMIN, AST, ALT, ALKPHOS, BILITOT, BILIDIR, IBILI,  in the last 72 hours No results found for this basename: CHOL,  in the last 72 hours No results found for this basename: PROTIME,  in the last 72 hours  Imaging: Imaging results have been reviewed and Ct Head Wo Contrast  11/21/2012  *RADIOLOGY REPORT*  Clinical Data: 67 year old male who awoke this morning with generalized weakness.  CT HEAD WITHOUT CONTRAST  Technique:  Contiguous axial images were obtained from the  base of the skull through the vertex without contrast.  Comparison: 05/09/2011 and earlier.  Findings: Increased mild to moderate scattered paranasal sinus mucosal thickening.  Mastoids and tympanic cavities remain clear. Osteopenia. No acute osseous abnormality identified.  Visualized orbits and scalp soft tissues are within normal limits.  Calcified atherosclerosis at the skull base.  Stable and normal cerebral volume.  No ventriculomegaly.  Confluent and patchy cerebral white matter hypodensity not significantly changed. No midline shift, mass effect, or evidence of mass lesion.  No evidence of cortically based acute infarction identified.  No acute intracranial hemorrhage identified.  No suspicious intracranial vascular hyperdensity.  IMPRESSION: Stable age advanced but nonspecific cerebral white matter changes. No acute intracranial abnormality.   Original Report Authenticated By: Erskine Speed, M.D.     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

## 2012-11-22 NOTE — Progress Notes (Signed)
  Echocardiogram 2D Echocardiogram has been performed.  [place] Co 11/22/2012, 11:02 AM

## 2012-11-22 NOTE — Evaluation (Addendum)
Physical Therapy Evaluation Patient Details Name: Derek Harrell MRN: [id number] DOB: Nov 20, 1945 Today's Date: 11/22/2012 Time: 4098-1191 PT Time Calculation (min): 36 min  PT Assessment / Plan / Recommendation Clinical Impression  67 y/o male adm for presyncope. H/o abnormal gait, currently undergoing w/u at outpatient neuro. Presents to PT today with below impairments affecting gait and balance and demonstrating increased risk of falls. Will benefit from OPPT for full w/u and treatment. No further acute PT needs at this time. Patient educated on how paying attention to his gait especially larger steps improves his pattern tremendously, as see in our session. He appears very excited to start PT. Pt also educated on adapting his home to decrease his risk of falls (night lights, picking up cords/loose rugs) as because of his tendency to shuffle he may catch his foot on these items easily. He declined trying an assistive device today (RW).     PT Assessment  All further PT needs can be met in the next venue of care    Follow Up Recommendations  Outpatient PT (neuro); Supervision - Intermittent    Does the patient have the potential to tolerate intense rehabilitation      Barriers to Discharge   lives alone but family can check on him often    Equipment Recommendations  None recommended by PT    Recommendations for Other Services     Frequency      Precautions / Restrictions Precautions Precautions: Fall Restrictions Weight Bearing Restrictions: No   Pertinent Vitals/Pain Denies pain      Mobility  Bed Mobility Bed Mobility: Not assessed Details for Bed Mobility Assistance: pt sitting EOB on my arrival Transfers Transfers: Sit to Stand;Stand to Sit Sit to Stand: 6: Modified independent (Device/Increase time);From bed;Other (comment);With upper extremity assist (from mat in gym) Stand to Sit: 6: Modified independent (Device/Increase time);To bed;Other (comment);With upper extremity  assist (to mat in gym) Ambulation/Gait Ambulation/Gait Assistance: 5: Supervision Ambulation Distance (Feet): 700 Feet Assistive device: None Ambulation/Gait Assistance Details: supervision with higher challenging activities, see DGI and balance section; with straight path activities he demonstrates slow speed and shuffled gait pattern, parkinsonian in nature, no LOB noted and he slows down to compensate for gait abnormality Gait Pattern: Step-through pattern;Decreased stride length;Decreased hip/knee flexion - right;Decreased trunk rotation;Trunk flexed;Shuffle Gait velocity: decreased General Gait Details: initiated gait in his room with shuffled feet especially on the left and slight anterior bias/trunk flexion, the shuffling increases when he is turning and as he fatigues or is distracted (parkinsonian in nature); gait and foot clearance improve when cued  to increase speed Stairs: Yes Stairs Assistance: 6: Modified independent (Device/Increase time);5: Supervision Stairs Assistance Details (indicate cue type and reason): modified independent when holding rails, supervision without rails due to patient slowing pace but no LOB Stair Management Technique: No rails Number of Stairs: 10    Exercises     PT Diagnosis: Abnormality of gait  PT Problem List: Decreased strength;Decreased balance;Decreased coordination PT Treatment Interventions:     PT Goals Acute Rehab PT Goals Time For Goal Achievement: 11/29/12  Visit Information  Last PT Received On: 11/22/12 Assistance Needed: +1    Subjective Data  Subjective: I think I need to start some physical therapy.  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 difficulties    Copywriter, advertising Arousal/Alertness: Awake/alert Behavior During Therapy: WFL for tasks assessed/performed Overall Cognitive Status: Within Functional Limits for tasks assessed    Extremity/Trunk Assessment Right Upper Extremity Assessment RUE ROM/Strength/Tone: WFL for tasks assessed Left Upper Extremity Assessment LUE ROM/Strength/Tone: WFL for tasks assessed Right Lower Extremity Assessment RLE ROM/Strength/Tone: Deficits RLE ROM/Strength/Tone Deficits: grossly 5/5 except hip flexion 4+/5 Left Lower Extremity Assessment LLE ROM/Strength/Tone: Deficits LLE ROM/Strength/Tone Deficits: grossly 5/5 except hip flexion 4/5 LLE Sensation: WFL - Light Touch   Balance Balance Balance Assessed: Yes Static Standing Balance Static Standing - Balance Support: No upper extremity supported Static Standing - Level of Assistance: 7: Independent Static Standing - Comment/# of Minutes: needs mingaurdA for rhomberg eyes closed, almost lost his balance x2 but was able to recover, demonstrates increased lateral sway; limited ability to perform SLS demonstrating increased difficulty with left, consistent with increased shuffling on the left Single Leg Stance - Right Leg: 10 Single Leg Stance - Left Leg: 5 Rhomberg - Eyes Opened: 60 (supervision with increased lateral sway) Rhomberg - Eyes Closed: 60 (mingaurdA with increased lateral sway) Standardized Balance Assessment Standardized Balance Assessment: Dynamic Gait Index Dynamic Gait Index Level Surface: Mild Impairment Change in Gait Speed: Mild Impairment Gait with Horizontal Head Turns: Moderate Impairment Gait with Vertical Head Turns: Moderate Impairment Gait and Pivot Turn:  Moderate Impairment Step Over Obstacle: Mild Impairment Step Around Obstacles: Mild Impairment Steps: Normal Total Score: 14  End of Session PT - End of Session Equipment Utilized During Treatment: Gait belt Activity Tolerance: Patient tolerated treatment well Patient left: in bed;with call bell/phone within reach Nurse Communication: Mobility status  GP     [facility] HELEN 11/22/2012, 10:36 AM

## 2012-11-22 NOTE — Progress Notes (Signed)
Subjective: No events overnight.   Patient became flushed yesterday, lightheaded causing him to present to ER. Gait abnormality present for several months, sees Dr. Anne Hahn as outpatient.   Exam: Filed Vitals:   11/22/12 0709  BP: 127/77  Pulse: 50  Temp: 97.6 F (36.4 C)  Resp: 16   Gen: In bed, NAD MS: Awake, Alert, oritented HY:QMVHQ, EOMI Motor: 5/5 strength throughout, very mild bradykinesia of the left hand, there is possibly also a mild increase in tone, though no cogwheeling was felt.  Sensory:intact to LT.  Gait: Small shuffling steps.   Impression: 67 yo M with episode of presyncope that sounds vagal tom me by history. His gait has the appearance of parkinsonism and I would favor repeateing a sinemet trial and titrating up on his dose as some people do not respond to lower doses. I do not feel that a seizure is at all likely and do not feel that an EEG is needed.   Recommendations: 1) Sinemet 25/100 TID, if tolerating after 2 -3 days,. Would increase to 1.5 tabs TID, if still tolerating after 2 -3 days later, would further increase to 2 tabs TID and may even need higher doses to state that he is truly a nonresponder.  2) No further workup needed at this time, but a PT evaluation for gait safety training would be prudent.  3) If recurrent stereotyped episodes were to occur, would consider EEG at that time.  4) Neurology will sign off at this time, please call with further questions.   Ritta Slot, MD [facility] [phone]  If 7pm- 7am, please page neurology on call at [phone].

## 2012-11-23 MED ORDER — ASPIRIN 81 MG PO TBEC: 81.0000 mg | DELAYED_RELEASE_TABLET | Freq: Every day | ORAL | Status: DC

## 2012-11-23 MED ORDER — CARBIDOPA-LEVODOPA CR 25-100 MG PO TBCR: 1.0000 | EXTENDED_RELEASE_TABLET | Freq: Three times a day (TID) | ORAL | Status: DC

## 2012-11-23 MED ORDER — ASPIRIN 325 MG PO TBEC: 325.0000 mg | DELAYED_RELEASE_TABLET | Freq: Every day | ORAL | Status: DC

## 2012-11-23 NOTE — Progress Notes (Signed)
Talked to Dr. Sharyn Lull, about PT for patient,  Instructed to have patient stop in his office to pick up RX for therapy as outpatient.  Onica Davidovich RN

## 2012-11-23 NOTE — Discharge Summary (Signed)
  Discharge summary dictated on 11/23/2012 dictation number is [id number]

## 2012-11-24 NOTE — Discharge Summary (Signed)
NAMESARAH, ZERBY                   ACCOUNT NO.:  [id number]  MEDICAL RECORD NO.:  [id number]  LOCATION:  4N25C                        FACILITY:  [facility]  PHYSICIAN:  Mohamadou Maciver N. Sharyn Lull, M.D. DATE OF BIRTH:  29-Apr-1946  DATE OF ADMISSION:  11/21/2012 DATE OF DISCHARGE:  11/23/2012                              DISCHARGE SUMMARY   ADMITTING DIAGNOSES: 1. Weakness and gait abnormality rule out cerebrovascular accident. 2. 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 3. Hypertension. 4. Hypercholesteremia.  DISCHARGE DIAGNOSES: 1. Status post weakness/near syncope. 2. Doubt transient ischemic attack. 3. 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 4. Hypertension. 5. Hypercholesteremia. 6. History of cerebrovascular accident in the past.  DISCHARGE HOME MEDICATIONS: 1. Enteric-coated aspirin 81 mg 1 tablet daily. 2. Sinemet CR 1 tablet 3 times daily. 3. Amlodipine 5 mg daily. 4. Clopidogrel 75 mg 1 tablet daily. 5. Bystolic 10 mg daily. 6. Crestor 10 mg daily.  DIET:  Low salt, low cholesterol.  ACTIVITY:  Increase activity slowly as tolerated.  Follow up with me in 1 week.  Follow up with Dr. Shana Chute as scheduled. Follow up with Neuro as scheduled.  CONDITION AT DISCHARGE:  Stable.  BRIEF HISTORY AND HOSPITAL COURSE:  Mr. Darley is a 67 year old male with past medical history significant for hypertension, history of CVA several years ago, who presents with complaints of having difficulty walking this morning in [place].  Fellow church members encouraged him to come to the hospital.  He had felt generally weak and then flushed this morning.  His prior stroke left him with deficit and feeling dizzy, and closed his eyes but no difficulty ambulating in the [place] member who is here with him states that he normally is able to ambulate with a steady gait.  She noticed that he was shoveling this morning.  The patient is unable to give me exact timing of the onset of symptoms and very vague with his history but states  he has been on Lantus all morning.  He has no difficulty with vision, no difficulty with speech.  His memory appears to be intact.  PHYSICAL EXAMINATION:  VITAL SIGNS:  His blood pressure was 155/88, pulse was 68.  He was afebrile. HEENT:  PERRLA. NECK:  Supple.  No JVD.  No bruit. LUNGS:  Clear to auscultation bilaterally. CARDIOVASCULAR:  Regular rate and rhythm.  No obvious murmur or rubs. EXTREMITIES:  There is no clubbing, cyanosis, or edema. NEUROLOGIC:  Grossly intact except his gait is slow and unsteady.  His labs sodium was 139, potassium 4.6, BUN 13, creatinine 0.82, glucose was 87, hemoglobin was 15.7, hematocrit 44.2, white count of 8.4. Troponin I 2 sets were negative.  The patient had CT of the brain in the ER which showed stable age advanced but nonspecific cerebral white matter changes.  No acute intracranial abnormality.  An EKG showed normal sinus rhythm, sinus bradycardia.  2D echo showed normal LV systolic function with no wall motion abnormalities.  No cardiac source of emboli.  BRIEF HOSPITAL COURSE:  The patient was admitted to telemetry unit. Neurology consultation was obtained.  The patient was felt to have stuttering gait appears to be due to Parkinsonism.  The patient was restarted on  Sinemet and the dose will be adjusted as outpatient as tolerated.  The patient did not have any further episodes of weakness or dizziness or any cardiac arrhythmias during the hospital stay.  The patient is walking in the room.  OT, PT consultation was obtained.  The patient is eager to go home and will be discharged home and will be followed up as an outpatient.     Eduardo Osier. Sharyn Lull, M.D.     [facility]  D:  11/23/2012  T:  11/24/2012  Job:  [id number]

## 2012-12-09 ENCOUNTER — Ambulatory Visit: Payer: Medicare Other | Attending: Cardiology | Admitting: Physical Therapy

## 2012-12-09 DIAGNOSIS — IMO0001 Reserved for inherently not codable concepts without codable children: Secondary | ICD-10-CM | POA: Insufficient documentation

## 2012-12-09 DIAGNOSIS — R269 Unspecified abnormalities of gait and mobility: Secondary | ICD-10-CM | POA: Insufficient documentation

## 2012-12-15 ENCOUNTER — Ambulatory Visit: Payer: Medicare Other | Admitting: Physical Therapy

## 2012-12-17 ENCOUNTER — Ambulatory Visit: Payer: Medicare Other | Admitting: Physical Therapy

## 2012-12-20 ENCOUNTER — Ambulatory Visit: Payer: Medicare Other | Admitting: Physical Therapy

## 2012-12-23 ENCOUNTER — Ambulatory Visit: Payer: Medicare Other | Admitting: Physical Therapy

## 2013-01-03 ENCOUNTER — Ambulatory Visit: Payer: Medicare Other | Attending: Cardiology | Admitting: Physical Therapy

## 2013-01-03 DIAGNOSIS — IMO0001 Reserved for inherently not codable concepts without codable children: Secondary | ICD-10-CM | POA: Insufficient documentation

## 2013-01-03 DIAGNOSIS — R269 Unspecified abnormalities of gait and mobility: Secondary | ICD-10-CM | POA: Insufficient documentation

## 2013-01-05 ENCOUNTER — Ambulatory Visit: Payer: Medicare Other | Admitting: Physical Therapy

## 2013-01-10 ENCOUNTER — Telehealth: Payer: Self-pay | Admitting: Neurology

## 2013-01-10 ENCOUNTER — Ambulatory Visit: Payer: Medicare Other | Admitting: Physical Therapy

## 2013-01-10 MED ORDER — DEXTROMETHORPHAN-QUINIDINE 20-10 MG PO CAPS: 1.0000 | ORAL_CAPSULE | Freq: Two times a day (BID) | ORAL | Status: DC

## 2013-01-10 MED ORDER — LINACLOTIDE 290 MCG PO CAPS: 290.0000 ug | ORAL_CAPSULE | Freq: Every day | ORAL | Status: DC

## 2013-01-10 NOTE — Telephone Encounter (Signed)
Patient also inquired about Nuedexta.  Both this and Linzess samples were previously given (see [facility] note).  Patient asked for more samples of Nuedexta.  Dispensed 2 bottles (#13 in each bottle) Lot [id number] Exp 08/2013.  As well, Rx's were sent to the pharmacy.

## 2013-01-12 ENCOUNTER — Ambulatory Visit: Payer: Medicare Other | Admitting: Physical Therapy

## 2013-01-17 ENCOUNTER — Ambulatory Visit: Payer: Medicare Other | Admitting: Physical Therapy

## 2013-01-19 ENCOUNTER — Ambulatory Visit: Payer: Medicare Other | Admitting: Physical Therapy

## 2013-01-24 ENCOUNTER — Ambulatory Visit: Payer: Medicare Other | Admitting: Physical Therapy

## 2013-01-26 ENCOUNTER — Ambulatory Visit: Payer: Medicare Other | Admitting: Physical Therapy

## 2013-01-31 ENCOUNTER — Ambulatory Visit: Payer: Medicare Other | Admitting: Physical Therapy

## 2013-02-02 ENCOUNTER — Ambulatory Visit: Payer: Medicare Other | Attending: Cardiology | Admitting: Physical Therapy

## 2013-02-02 DIAGNOSIS — R269 Unspecified abnormalities of gait and mobility: Secondary | ICD-10-CM | POA: Insufficient documentation

## 2013-02-02 DIAGNOSIS — IMO0001 Reserved for inherently not codable concepts without codable children: Secondary | ICD-10-CM | POA: Insufficient documentation

## 2013-02-07 ENCOUNTER — Ambulatory Visit: Payer: Medicare Other | Admitting: Physical Therapy

## 2013-03-23 ENCOUNTER — Ambulatory Visit (INDEPENDENT_AMBULATORY_CARE_PROVIDER_SITE_OTHER): Payer: Medicare Other | Admitting: Cardiovascular Disease

## 2013-03-23 ENCOUNTER — Encounter: Payer: Self-pay | Admitting: Cardiovascular Disease

## 2013-03-23 VITALS — BP 128/80 | HR 52 | Ht 73.0 in | Wt 228.0 lb

## 2013-03-23 DIAGNOSIS — I714 Abdominal aortic aneurysm, without rupture: Secondary | ICD-10-CM

## 2013-03-23 DIAGNOSIS — Z79899 Other long term (current) drug therapy: Secondary | ICD-10-CM

## 2013-03-23 DIAGNOSIS — I10 Essential (primary) hypertension: Secondary | ICD-10-CM | POA: Insufficient documentation

## 2013-03-23 DIAGNOSIS — E785 Hyperlipidemia, unspecified: Secondary | ICD-10-CM | POA: Insufficient documentation

## 2013-03-23 NOTE — Assessment & Plan Note (Signed)
Well-controlled on current medications 

## 2013-03-23 NOTE — Assessment & Plan Note (Signed)
On statin therapy. We'll recheck a lipid and liver profile 

## 2013-03-23 NOTE — Patient Instructions (Signed)
  Your physician wants you to follow-up with him in : 1 year                                                             You will receive a reminder letter in the mail one month in advance. If you don't receive a letter, please call our office to schedule the follow-up appointment.   Your physician recommends that you return for lab work in: 1-2 weeks, fasting    Your physician has ordered the following tests: abdominal doppler to look at your AAA

## 2013-03-23 NOTE — Assessment & Plan Note (Signed)
Found by interdental CT scanning of the abdomen measuring 3.6 cm. We will check an abdominal ultrasound as a baseline measurement and follow this annually.

## 2013-03-23 NOTE — Progress Notes (Signed)
03/23/2013 Rayna Sexton [place]   02-25-1946  [id number]  Primary Physician Robynn Pane, MD Primary Cardiologist: Runell Gess MD Roseanne Reno  HPI:  The patient is a delightful 67 year old moderately overweight divorced African American male, father of 3, grandfather to 5 grandchildren, referred through the courtesy of Dr. Cephus Richer at [facility] for evaluation of an incidentally-noted small abdominal aortic aneurysm on CT scanning.   The patient lives alone. He is retired from [employer], where he worked for 39 years. His cardiac risk factor profile is positive for hypertension and hyperlipidemia. He quit smoking 15 years ago and smoked 1/2 to 1 pack a [place] for 25 years prior to that. He does have a family history of heart disease with a father who died of an MI at age 23. He has never had a heart attack, but has had a stroke 4 to 5 years ago without residual neurologic disability. He denies chest pain or shortness of breath. A CT scan was done for evaluation of abdominal pain, which was unremarkable except for the above-noted small abdominal aortic aneurysm.   Since I saw him 6 months ago he denies chest pain and shortness of breath. He has not had other abdominal or aortic aneurysm measured by 2-D ultrasound which I going to obtain.     Current Outpatient Prescriptions  Medication Sig Dispense Refill  . amLODipine (NORVASC) 5 MG tablet Take 5 mg by mouth daily.      Marland Kitchen aspirin 81 MG EC tablet Take 1 tablet (81 mg total) by mouth daily. Swallow whole.  30 tablet  12  . carbidopa-levodopa (SINEMET CR) 25-100 MG per tablet Take 1 tablet by mouth 3 (three) times daily.  90 tablet  3  . clopidogrel (PLAVIX) 75 MG tablet Take 75 mg by mouth daily.      Marland Kitchen Dextromethorphan-Quinidine (NUEDEXTA) 20-10 MG CAPS Take 1 capsule by mouth every 12 (twelve) hours.  60 capsule  6  . Linaclotide (LINZESS) 290 MCG CAPS Take 290 mcg by mouth daily.  30 capsule  6  . nebivolol (BYSTOLIC) 10  MG tablet Take 10 mg by mouth daily.      . rosuvastatin (CRESTOR) 10 MG tablet Take 10 mg by mouth daily.       No current facility-administered medications for this visit.    No Known Allergies  History   Social History  . Marital Status: Divorced    Spouse Name: N/A    Number of Children: N/A  . Years of Education: N/A   Occupational History  . Not on file.   Social History Main Topics  . Smoking status: Former Smoker    Quit date: 03/23/1993  . Smokeless tobacco: Not on file  . Alcohol Use: Yes     Comment: occ  . Drug Use: No  . Sexual Activity: Not on file   Other Topics Concern  . Not on file   Social History Narrative  . No narrative on file     Review of Systems: General: negative for chills, fever, night sweats or weight changes.  Cardiovascular: negative for chest pain, dyspnea on exertion, edema, orthopnea, palpitations, paroxysmal nocturnal dyspnea or shortness of breath Dermatological: negative for rash Respiratory: negative for cough or wheezing Urologic: negative for hematuria Abdominal: negative for nausea, vomiting, diarrhea, bright red blood per rectum, melena, or hematemesis Neurologic: negative for visual changes, syncope, or dizziness All other systems reviewed and are otherwise negative except as noted above.    Blood pressure 128/80,  pulse 52, height 6\' 1"  (1.854 m), weight 228 lb (103.42 kg).  General appearance: alert and no distress Neck: no adenopathy, no carotid bruit, no JVD, supple, symmetrical, trachea midline and thyroid not enlarged, symmetric, no tenderness/mass/nodules Lungs: clear to auscultation bilaterally Heart: regular rate and rhythm, S1, S2 normal, no murmur, click, rub or gallop Extremities: extremities normal, atraumatic, no cyanosis or edema  EKG sinus bradycardia at 52 without ST or T wave changes  ASSESSMENT AND PLAN:   Abdominal aortic aneurysm Found by interdental CT scanning of the abdomen measuring 3.6 cm.  We will check an abdominal ultrasound as a baseline measurement and follow this annually.  Essential hypertension Well-controlled on current medications  Hyperlipidemia On statin therapy. We'll recheck a lipid and liver profile      Runell Gess MD [facility], [facility] 03/23/2013 12:51 PM

## 2013-03-24 LAB — LIPID PANEL
HDL: 40 mg/dL (ref >39)
LDL Cholesterol: 79 mg/dL (ref 0–99)
Total CHOL/HDL Ratio: 3.3 Ratio
VLDL: 12 mg/dL (ref 0–40)

## 2013-03-24 LAB — HEPATIC FUNCTION PANEL
Alkaline Phosphatase: 52 U/L (ref 39–117)
Bilirubin, Direct: 0.2 mg/dL (ref 0.0–0.3)
Indirect Bilirubin: 0.9 mg/dL (ref 0.0–0.9)
Total Protein: 6.7 g/dL (ref 6.0–8.3)

## 2013-03-28 ENCOUNTER — Inpatient Hospital Stay (HOSPITAL_COMMUNITY): Admission: RE | Admit: 2013-03-28 | Payer: Medicare Other | Source: Ambulatory Visit

## 2013-03-29 ENCOUNTER — Encounter: Payer: Self-pay | Admitting: *Deleted

## 2013-03-30 ENCOUNTER — Ambulatory Visit (HOSPITAL_COMMUNITY)
Admission: RE | Admit: 2013-03-30 | Discharge: 2013-03-30 | Disposition: A | Discharge status: Home / Self Care | Payer: Medicare Other | Source: Ambulatory Visit | Attending: Cardiovascular Disease | Admitting: Cardiovascular Disease

## 2013-03-30 DIAGNOSIS — I714 Abdominal aortic aneurysm, without rupture, unspecified: Secondary | ICD-10-CM | POA: Insufficient documentation

## 2013-03-30 NOTE — Progress Notes (Signed)
Abdominal Aortic Duplex Completed °Brianna L Mazza,RVT °

## 2013-04-07 ENCOUNTER — Encounter: Payer: Self-pay | Admitting: Neurology

## 2013-04-12 ENCOUNTER — Telehealth: Payer: Self-pay | Admitting: *Deleted

## 2013-04-12 ENCOUNTER — Encounter: Payer: Self-pay | Admitting: Neurology

## 2013-04-12 ENCOUNTER — Ambulatory Visit (INDEPENDENT_AMBULATORY_CARE_PROVIDER_SITE_OTHER): Payer: 59 | Admitting: Neurology

## 2013-04-12 DIAGNOSIS — R269 Unspecified abnormalities of gait and mobility: Secondary | ICD-10-CM

## 2013-04-12 DIAGNOSIS — G20 Parkinson's disease: Secondary | ICD-10-CM | POA: Insufficient documentation

## 2013-04-12 DIAGNOSIS — F482 Pseudobulbar affect: Secondary | ICD-10-CM

## 2013-04-12 DIAGNOSIS — I714 Abdominal aortic aneurysm, without rupture: Secondary | ICD-10-CM

## 2013-04-12 HISTORY — DX: Unspecified abnormalities of gait and mobility: R26.9

## 2013-04-12 NOTE — Telephone Encounter (Signed)
Message copied by Marella Bile on Tue Apr 12, 2013  1:50 PM ------      Message from: Runell Gess      Created: Fri Apr 08, 2013  2:41 PM       Small abdominal aortic aneurysm. Followup duplex in 6 months ------

## 2013-04-12 NOTE — Telephone Encounter (Signed)
Order placed for repeat abd doppler in 6 months

## 2013-04-12 NOTE — Progress Notes (Signed)
Reason for visit: Gait disorder  Derek Harrell is an 67 y.o. male  History of present illness:  Derek Harrell is a 67 year old right-handed black male with a history of cerebrovascular disease and a history of a gait disorder. The patient has been felt to have some parkinsonian features with a slightly stooped gait, decreased arm swing, and some shuffling. The patient however, has not developed a full Parkinson syndrome, and he has not responded to Sinemet. The patient was in the hospital in April 2014 after he got overheated and dizzy. The patient was placed back on Sinemet at that point, and he remains on the medication taking the 25/100 mg tablets 3 times daily. The patient does report some blurring of vision. The patient has not had any falls. The patient has a pseudobulbar affect, and he has gained improvement from [place], but he finds it difficult to afford the medication. Linzess has also helped his constipation issues. The patient returns to this office for an evaluation. The patient reports some decreased memory and cognitive capacity since his stroke.  Past Medical History  Diagnosis Date  . Hypertension   . Abdominal aortic aneurysm   . Hyperlipidemia   . Cerebrovascular disease   . Gait disorder   . Constipation   . 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 Pseudobulbar affect   . Dizziness   . Abnormality of gait 04/12/2013  . Obesity   . Stroke     Left pontine stroke    Past Surgical History  Procedure Laterality Date  . Femur fracture surgery Right   . Appendectomy      Family History  Problem Relation Age of Onset  . Colon cancer Brother     Social history:  reports that he quit smoking about 20 years ago. He has never used smokeless tobacco. He reports that  drinks alcohol. He reports that he does not use illicit drugs.   No Known Allergies  Medications:  Current Outpatient Prescriptions on File Prior to Visit  Medication Sig Dispense Refill  . amLODipine (NORVASC) 5  MG tablet Take 5 mg by mouth daily.      Marland Kitchen aspirin 81 MG EC tablet Take 1 tablet (81 mg total) by mouth daily. Swallow whole.  30 tablet  12  . clopidogrel (PLAVIX) 75 MG tablet Take 75 mg by mouth daily.      . Linaclotide (LINZESS) 290 MCG CAPS Take 290 mcg by mouth daily.  30 capsule  6  . nebivolol (BYSTOLIC) 10 MG tablet Take 10 mg by mouth daily.      . rosuvastatin (CRESTOR) 10 MG tablet Take 10 mg by mouth daily.       No current facility-administered medications on file prior to visit.    ROS:  Out of a complete 14 system review of symptoms, the patient complains only of the following symptoms, and all other reviewed systems are negative.  Ringing in the ears Blurred vision Snoring Memory loss  Blood pressure 135/86, pulse 65, weight 231 lb (104.781 kg).  Physical Exam  General: The patient is alert and cooperative at the time of the examination. The patient is moderately obese.  Skin: No significant peripheral edema is noted.   Neurologic Exam  Cranial nerves: Facial symmetry is present. Speech is normal, no aphasia or dysarthria is noted. Extraocular movements are full. Visual fields are full.  Motor: The patient has good strength in all 4 extremities.  Coordination: The patient has good finger-nose-finger and heel-to-shin bilaterally.  Gait and station: The patient has the ability to stand with the arms crossed. Once up, the patient ambulates without assistance, but he has decreased arm swing, slightly stooped posture. Tandem gait is slightly unsteady. Romberg is negative. No drift is seen.  Reflexes: Deep tendon reflexes are symmetric.   Assessment/Plan:  1. Mild gait disorder  2. Cerebrovascular disease  3. Pseudobulbar affect  4. Chronic constipation  The patient has done well with the Linzess, and this will be continued for the constipation. The pseudobulbar affect has been improved with Nuedexa. The patient was given samples today, and he was given  a copay card to help reduce the cost of the medication. The patient will followup in about 6 months. I may get a second opinion from one of my colleagues regarding his movement disorder and gait disorder. The patient will taper off of the Sinemet, as I am not clear that he is gaining any benefit with it.  Derek Palau MD 04/12/2013 7:18 PM  [facility] [address] [place], [place] [address]  Phone [phone] Fax [phone]

## 2013-08-10 ENCOUNTER — Ambulatory Visit: Payer: Medicare Other | Admitting: Physical Therapy

## 2013-08-11 ENCOUNTER — Ambulatory Visit: Payer: Medicare Other | Attending: Cardiology | Admitting: Physical Therapy

## 2013-08-11 DIAGNOSIS — G20 Parkinson's disease: Secondary | ICD-10-CM | POA: Insufficient documentation

## 2013-08-11 DIAGNOSIS — IMO0001 Reserved for inherently not codable concepts without codable children: Secondary | ICD-10-CM | POA: Insufficient documentation

## 2013-08-11 DIAGNOSIS — G20A1 Parkinson's disease without dyskinesia, without mention of fluctuations: Secondary | ICD-10-CM | POA: Insufficient documentation

## 2013-08-11 DIAGNOSIS — R269 Unspecified abnormalities of gait and mobility: Secondary | ICD-10-CM | POA: Insufficient documentation

## 2013-08-12 ENCOUNTER — Other Ambulatory Visit: Payer: Self-pay | Admitting: Neurology

## 2013-08-12 DIAGNOSIS — R269 Unspecified abnormalities of gait and mobility: Secondary | ICD-10-CM

## 2013-08-12 NOTE — Progress Notes (Signed)
The physical therapist has seen this gentleman for a gait analysis, and has recommended ongoing physical therapy. I will write an order for this.

## 2013-08-15 ENCOUNTER — Telehealth: Payer: Self-pay | Admitting: *Deleted

## 2013-08-16 ENCOUNTER — Ambulatory Visit: Payer: Medicare Other | Admitting: Cardiology

## 2013-08-17 ENCOUNTER — Ambulatory Visit: Payer: Self-pay | Admitting: Neurology

## 2013-08-21 ENCOUNTER — Observation Stay (HOSPITAL_COMMUNITY)
Admission: EM | Admit: 2013-08-21 | Discharge: 2013-08-23 | Disposition: A | Discharge status: Home / Self Care | Payer: Medicare Other | Attending: Cardiology | Admitting: Cardiology

## 2013-08-21 ENCOUNTER — Encounter (HOSPITAL_COMMUNITY): Payer: Self-pay | Admitting: Emergency Medicine

## 2013-08-21 ENCOUNTER — Emergency Department (HOSPITAL_COMMUNITY): Payer: Medicare Other

## 2013-08-21 ENCOUNTER — Other Ambulatory Visit: Payer: Self-pay

## 2013-08-21 DIAGNOSIS — R531 Weakness: Secondary | ICD-10-CM | POA: Diagnosis present

## 2013-08-21 DIAGNOSIS — Z79899 Other long term (current) drug therapy: Secondary | ICD-10-CM | POA: Insufficient documentation

## 2013-08-21 DIAGNOSIS — Z7902 Long term (current) use of antithrombotics/antiplatelets: Secondary | ICD-10-CM | POA: Insufficient documentation

## 2013-08-21 DIAGNOSIS — R5383 Other fatigue: Principal | ICD-10-CM

## 2013-08-21 DIAGNOSIS — Z87891 Personal history of nicotine dependence: Secondary | ICD-10-CM | POA: Insufficient documentation

## 2013-08-21 DIAGNOSIS — Z23 Encounter for immunization: Secondary | ICD-10-CM | POA: Insufficient documentation

## 2013-08-21 DIAGNOSIS — G4730 Sleep apnea, unspecified: Secondary | ICD-10-CM | POA: Insufficient documentation

## 2013-08-21 DIAGNOSIS — R5381 Other malaise: Principal | ICD-10-CM | POA: Insufficient documentation

## 2013-08-21 DIAGNOSIS — I714 Abdominal aortic aneurysm, without rupture, unspecified: Secondary | ICD-10-CM | POA: Insufficient documentation

## 2013-08-21 DIAGNOSIS — E785 Hyperlipidemia, unspecified: Secondary | ICD-10-CM | POA: Insufficient documentation

## 2013-08-21 DIAGNOSIS — I10 Essential (primary) hypertension: Secondary | ICD-10-CM | POA: Insufficient documentation

## 2013-08-21 DIAGNOSIS — Z8673 Personal history of transient ischemic attack (TIA), and cerebral infarction without residual deficits: Secondary | ICD-10-CM | POA: Insufficient documentation

## 2013-08-21 DIAGNOSIS — IMO0002 Reserved for concepts with insufficient information to code with codable children: Secondary | ICD-10-CM

## 2013-08-21 DIAGNOSIS — F482 Pseudobulbar affect: Secondary | ICD-10-CM | POA: Insufficient documentation

## 2013-08-21 DIAGNOSIS — R42 Dizziness and giddiness: Secondary | ICD-10-CM | POA: Insufficient documentation

## 2013-08-21 DIAGNOSIS — Z7982 Long term (current) use of aspirin: Secondary | ICD-10-CM | POA: Insufficient documentation

## 2013-08-21 DIAGNOSIS — K5900 Constipation, unspecified: Secondary | ICD-10-CM | POA: Insufficient documentation

## 2013-08-21 DIAGNOSIS — R269 Unspecified abnormalities of gait and mobility: Secondary | ICD-10-CM | POA: Insufficient documentation

## 2013-08-21 DIAGNOSIS — E669 Obesity, unspecified: Secondary | ICD-10-CM | POA: Insufficient documentation

## 2013-08-21 HISTORY — DX: Sleep apnea, unspecified: G47.30

## 2013-08-21 LAB — URINALYSIS W MICROSCOPIC + REFLEX CULTURE
BILIRUBIN URINE: NEGATIVE
Glucose, UA: NEGATIVE mg/dL
Hgb urine dipstick: NEGATIVE
Ketones, ur: NEGATIVE mg/dL
Leukocytes, UA: NEGATIVE
NITRITE: NEGATIVE
Protein, ur: NEGATIVE mg/dL
SPECIFIC GRAVITY, URINE: 1.018 (ref 1.005–1.030)
UROBILINOGEN UA: 4 mg/dL — AB (ref 0.0–1.0)
pH: 7 (ref 5.0–8.0)

## 2013-08-21 LAB — COMPREHENSIVE METABOLIC PANEL
ALT: 14 U/L (ref 0–53)
AST: 18 U/L (ref 0–37)
Albumin: 3.9 g/dL (ref 3.5–5.2)
Alkaline Phosphatase: 59 U/L (ref 39–117)
BUN: 15 mg/dL (ref 6–23)
CALCIUM: 9 mg/dL (ref 8.4–10.5)
CO2: 27 meq/L (ref 19–32)
CREATININE: 0.86 mg/dL (ref 0.50–1.35)
Chloride: 107 mEq/L (ref 96–112)
GFR, EST NON AFRICAN AMERICAN: 89 mL/min — AB (ref >90)
GLUCOSE: 89 mg/dL (ref 70–99)
Potassium: 4 mEq/L (ref 3.7–5.3)
Sodium: 147 mEq/L (ref 137–147)
Total Bilirubin: 0.7 mg/dL (ref 0.3–1.2)
Total Protein: 7.6 g/dL (ref 6.0–8.3)

## 2013-08-21 LAB — CBC
HCT: 45.4 % (ref 39.0–52.0)
HEMOGLOBIN: 15.9 g/dL (ref 13.0–17.0)
MCH: 27.7 pg (ref 26.0–34.0)
MCHC: 35 g/dL (ref 30.0–36.0)
MCV: 79.1 fL (ref 78.0–100.0)
Platelets: 170 10*3/uL (ref 150–400)
RBC: 5.74 MIL/uL (ref 4.22–5.81)
RDW: 13.4 % (ref 11.5–15.5)
WBC: 10.1 10*3/uL (ref 4.0–10.5)

## 2013-08-21 LAB — POCT I-STAT TROPONIN I: TROPONIN I, POC: 0.01 ng/mL (ref 0.00–0.08)

## 2013-08-21 LAB — CG4 I-STAT (LACTIC ACID): Lactic Acid, Venous: 1.67 mmol/L (ref 0.5–2.2)

## 2013-08-21 MED ORDER — ATORVASTATIN CALCIUM 40 MG PO TABS
40.0000 mg | ORAL_TABLET | Freq: Every day | ORAL | Status: DC
  Administered 2013-08-22 – 2013-08-23 (×2): 40 mg via ORAL
  Filled 2013-08-21 (×2): qty 1

## 2013-08-21 MED ORDER — NEBIVOLOL HCL 10 MG PO TABS
10.0000 mg | ORAL_TABLET | Freq: Every day | ORAL | Status: DC
  Administered 2013-08-22 – 2013-08-23 (×2): 10 mg via ORAL
  Filled 2013-08-21 (×2): qty 1

## 2013-08-21 MED ORDER — AMLODIPINE BESYLATE 5 MG PO TABS
5.0000 mg | ORAL_TABLET | Freq: Every day | ORAL | Status: DC
  Administered 2013-08-22 – 2013-08-23 (×2): 5 mg via ORAL
  Filled 2013-08-21 (×2): qty 1

## 2013-08-21 MED ORDER — CLOPIDOGREL BISULFATE 75 MG PO TABS
75.0000 mg | ORAL_TABLET | Freq: Every day | ORAL | Status: DC
  Administered 2013-08-22 – 2013-08-23 (×2): 75 mg via ORAL
  Filled 2013-08-21 (×3): qty 1

## 2013-08-21 NOTE — ED Notes (Signed)
Pt states he has Md appt. In the AM to check status of Aortic Aneurysm; Pt states he has had no issues with it thus far.

## 2013-08-21 NOTE — ED Notes (Signed)
MD at bedside. 

## 2013-08-21 NOTE — ED Provider Notes (Signed)
CSN: [id number]     Arrival date & time 08/21/13  1825 History   First MD Initiated Contact with Patient 08/21/13 2030     Chief Complaint  Patient presents with  . Weakness    HPI Pt was seen at 2030. Per pt, c/o gradual onset and persistence of constant generalized weakness and vague "dizziness" that he noticed when he woke up this morning. States in 2009 he experienced the same symptoms and was dx with acute pontine CVA. Pt and family are concerned regarding same today.  No visual changes, no focal motor weakness, no tingling/numbness in extremities, no ataxia, no slurred speech, no facial droop, no CP/palpitations, no SOB/cough, no N/V/D, no abd pain.     Neuro: Dr. [name] Past Medical History  Diagnosis Date  . Hypertension   . Abdominal aortic aneurysm   . Hyperlipidemia   . Cerebrovascular disease   . Gait disorder   . Constipation   . 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 Pseudobulbar affect   . Dizziness   . Abnormality of gait 04/12/2013  . Obesity   . Stroke     Left pontine stroke   Past Surgical History  Procedure Laterality Date  . Femur fracture surgery Right   . Appendectomy     Family History  Problem Relation Age of Onset  . Colon cancer Brother    History  Substance Use Topics  . Smoking status: Former Smoker    Quit date: 03/23/1993  . Smokeless tobacco: Never Used  . Alcohol Use: Yes     Comment: occASIONAL    Review of Systems ROS: Statement: All systems negative except as marked or noted in the HPI; Constitutional: Negative for fever and chills. ; ; Eyes: Negative for eye pain, redness and discharge. ; ; ENMT: Negative for ear pain, hoarseness, nasal congestion, sinus pressure and sore throat. ; ; Cardiovascular: Negative for chest pain, palpitations, diaphoresis, dyspnea and peripheral edema. ; ; Respiratory: Negative for cough, wheezing and stridor. ; ; Gastrointestinal: Negative for nausea, vomiting, diarrhea, abdominal pain, blood in stool,  hematemesis, jaundice and rectal bleeding. . ; ; Genitourinary: Negative for dysuria, flank pain and hematuria. ; ; Musculoskeletal: Negative for back pain and neck pain. Negative for swelling and trauma.; ; Skin: Negative for pruritus, rash, abrasions, blisters, bruising and skin lesion.; ; Neuro: +generalized weakness, "dizziness." Negative for headache, lightheadedness and neck stiffness. Negative for altered level of consciousness , altered mental status, extremity weakness, paresthesias, involuntary movement, seizure and syncope.     Allergies  Review of patient's allergies indicates no known allergies.  Home Medications   Current Outpatient Rx  Name  Route  Sig  Dispense  Refill  . amLODipine (NORVASC) 5 MG tablet   Oral   Take 5 mg by mouth daily.         Marland Kitchen aspirin 81 MG EC tablet   Oral   Take 1 tablet (81 mg total) by mouth daily. Swallow whole.   30 tablet   12   . clopidogrel (PLAVIX) 75 MG tablet   Oral   Take 75 mg by mouth daily.         Marland Kitchen Dextromethorphan-Quinidine (NUEDEXTA) 20-10 MG CAPS   Oral   Take 1 tablet by mouth 2 (two) times daily.         . Linaclotide (LINZESS) 290 MCG CAPS   Oral   Take 290 mcg by mouth daily.   30 capsule   6   . Multiple  Vitamin (MULTIVITAMIN WITH MINERALS) TABS tablet   Oral   Take 1 tablet by mouth daily.         . nebivolol (BYSTOLIC) 10 MG tablet   Oral   Take 10 mg by mouth daily.         . rosuvastatin (CRESTOR) 10 MG tablet   Oral   Take 10 mg by mouth daily.          BP 170/86  Pulse 68  Temp(Src) 97.7 F (36.5 C) (Oral)  Resp 15  SpO2 99% Physical Exam 2035: Physical examination:  Nursing notes reviewed; Vital signs and O2 SAT reviewed;  Constitutional: Well developed, Well nourished, Well hydrated, In no acute distress; Head:  Normocephalic, atraumatic; Eyes: EOMI, PERRL, No scleral icterus; ENMT: Mouth and pharynx normal, Mucous membranes moist; Neck: Supple, Full range of motion, No  lymphadenopathy; Cardiovascular: Regular rate and rhythm, No gallop; Respiratory: Breath sounds clear & equal bilaterally, No wheezes.  Speaking full sentences with ease, Normal respiratory effort/excursion; Chest: Nontender, Movement normal; Abdomen: Soft, Nontender, Nondistended, Normal bowel sounds; Genitourinary: No CVA tenderness; Extremities: Pulses normal, No tenderness, No edema, No calf edema or asymmetry.; Neuro: AA&Ox3, Major CN grossly intact. Speech deliberate but clear.  No facial droop.  No nystagmus. Grips equal. Strength 5/5 equal bilat UE's and LE's.  DTR 2/4 equal bilat UE's and LE's.  No gross sensory deficits.  Normal cerebellar testing bilat UE's (finger-nose) and LE's (heel-shin). Gait slow and shuffling..; Skin: Color normal, Warm, Dry.   ED Course  Procedures   EKG Interpretation   None       MDM  MDM Reviewed: previous chart, nursing note and vitals Reviewed previous: labs, ECG, x-ray and MRI Interpretation: labs, ECG, x-ray and CT scan    Date: 08/21/2013  Rate: 77  Rhythm: normal sinus rhythm  QRS Axis: normal  Intervals: normal  ST/T Wave abnormalities: normal  Conduction Disutrbances:none  Narrative Interpretation:   Old EKG Reviewed: unchanged; no significant changes from previous EKG dated 03/23/2013.  Results for orders placed during the hospital encounter of 08/21/13  CBC      Result Value Range   WBC 10.1  4.0 - 10.5 K/uL   RBC 5.74  4.22 - 5.81 MIL/uL   Hemoglobin 15.9  13.0 - 17.0 g/dL   HCT 45.4  39.0 - 52.0 %   MCV 79.1  78.0 - 100.0 fL   MCH 27.7  26.0 - 34.0 pg   MCHC 35.0  30.0 - 36.0 g/dL   RDW 13.4  11.5 - 15.5 %   Platelets 170  150 - 400 K/uL  COMPREHENSIVE METABOLIC PANEL      Result Value Range   Sodium 147  137 - 147 mEq/L   Potassium 4.0  3.7 - 5.3 mEq/L   Chloride 107  96 - 112 mEq/L   CO2 27  19 - 32 mEq/L   Glucose, Bld 89  70 - 99 mg/dL   BUN 15  6 - 23 mg/dL   Creatinine, Ser 0.86  0.50 - 1.35 mg/dL   Calcium 9.0   8.4 - 10.5 mg/dL   Total Protein 7.6  6.0 - 8.3 g/dL   Albumin 3.9  3.5 - 5.2 g/dL   AST 18  0 - 37 U/L   ALT 14  0 - 53 U/L   Alkaline Phosphatase 59  39 - 117 U/L   Total Bilirubin 0.7  0.3 - 1.2 mg/dL   GFR calc non Af Amer 89 (*) >  90 mL/min   GFR calc Af Amer >90  >90 mL/min  URINALYSIS W MICROSCOPIC + REFLEX CULTURE      Result Value Range   Color, Urine YELLOW  YELLOW   APPearance CLEAR  CLEAR   Specific Gravity, Urine 1.018  1.005 - 1.030   pH 7.0  5.0 - 8.0   Glucose, UA NEGATIVE  NEGATIVE mg/dL   Hgb urine dipstick NEGATIVE  NEGATIVE   Bilirubin Urine NEGATIVE  NEGATIVE   Ketones, ur NEGATIVE  NEGATIVE mg/dL   Protein, ur NEGATIVE  NEGATIVE mg/dL   Urobilinogen, UA 4.0 (*) 0.0 - 1.0 mg/dL   Nitrite NEGATIVE  NEGATIVE   Leukocytes, UA NEGATIVE  NEGATIVE   WBC, UA 0-2  <3 WBC/hpf   RBC / HPF 0-2  <3 RBC/hpf   Bacteria, UA RARE  RARE   Squamous Epithelial / LPF RARE  RARE  CG4 I-STAT (LACTIC ACID)      Result Value Range   Lactic Acid, Venous 1.67  0.5 - 2.2 mmol/L  POCT I-STAT TROPONIN I      Result Value Range   Troponin i, poc 0.01  0.00 - 0.08 ng/mL   Comment 3            Dg Chest 2 View 08/21/2013   CLINICAL DATA:  Weakness.  EXAM: CHEST  2 VIEW  COMPARISON:  DG CHEST 2 VIEW dated 10/08/2010  FINDINGS: Lateral view degraded by patient arm position. Artifact degraded lateral view posteriorly as well. Patient rotated left. Midline trachea. Borderline cardiomegaly. Mediastinal contours otherwise within normal limits. No pleural effusion or pneumothorax. Clear lungs.  IMPRESSION: Borderline cardiomegaly, without acute disease.   Electronically Signed   By: Abigail Miyamoto M.D.   On: 08/21/2013 22:08   Ct Head (brain) Wo Contrast 08/21/2013   CLINICAL DATA:  WEAKNESS  EXAM: CT HEAD WITHOUT CONTRAST  TECHNIQUE: Contiguous axial images were obtained from the base of the skull through the vertex without intravenous contrast.  COMPARISON:  CT HEAD W/O CM dated 11/21/2012  FINDINGS:  No acute intracranial hemorrhage. No focal mass lesion. No CT evidence of acute infarction. No midline shift or mass effect. No hydrocephalus. Basilar cisterns are patent. There is mild periventricular white matter hypodensities similar prior. Mastoid air cells are clear. Mild mucosal thickening in the maxillary sinuses. Orbits are normal.  IMPRESSION: 1. No acute intracranial findings. 2. Mild white matter microvascular disease. 3. Mild maxillary sinus inflammation.   Electronically Signed   By: Suzy Bouchard M.D.   On: 08/21/2013 21:56     2300:  EPIC chart reviewed: pt did present with similar symptoms in 2009 and was dx with acute pontine stroke. MRI staff already gone for the [place]; pt needs same to r/o acute CVA. Pt not code stroke, as pt woke up with atypical symptoms. No change in neuro exam. Dx and testing d/w pt and family.  Questions answered.  Verb understanding, agreeable to observation admit.  T/C to Dr. Doylene Canard (on call for Dr. Terrence Dupont), case discussed, including:  HPI, pertinent PM/SHx, VS/PE, dx testing, ED course and treatment:  Agreeable to observation admit, requests to write temporary orders, obtain tele bed.   Alfonzo Feller, DO 08/22/13 1242

## 2013-08-21 NOTE — ED Notes (Signed)
Pt reports that he has a hx of stroke and has been having symptoms similar to what he had with the stroke. Reports that he has been feeling week today and just has not energy. Denies any pain. Skin warm and dry. No neuro deficits noted at triage.

## 2013-08-21 NOTE — ED Notes (Signed)
Unable to draw blood at this time. Attempted x1.

## 2013-08-21 NOTE — H&P (Signed)
Derek Harrell is an 68 y.o. male.   Chief Complaint: Weakness HPI: 68 year old male with a history of hypertension and a stroke several years ago who presents with a complaint of having difficulty walking this morning along with weakness with no difficulty with vision or speech. His memory appears to be intact. His prior stroke left him with a deficit of feeling dizzy when he closed his eyes but had no difficulty ambulating with eyes open. He is very vague with his history but states it has been like that all morning. He had left paracentral pontine infarct and marked small vessel disease in 2009. MRI today is pending.   Past Medical History  Diagnosis Date  . Hypertension   . Abdominal aortic aneurysm   . Hyperlipidemia   . Cerebrovascular disease   . Gait disorder   . Constipation   . 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 Pseudobulbar affect   . Dizziness   . Abnormality of gait 04/12/2013  . Obesity   . Stroke     Left pontine stroke      Past Surgical History  Procedure Laterality Date  . Femur fracture surgery Right   . Appendectomy      Family History  Problem Relation Age of Onset  . Colon cancer Brother    Social History:  reports that he quit smoking about 20 years ago. He has never used smokeless tobacco. He reports that he drinks alcohol. He reports that he does not use illicit drugs.  Allergies: No Known Allergies   (Not in a hospital admission)  Results for orders placed during the hospital encounter of 08/21/13 (from the past 48 hour(Harrell))  CBC     Status: None   Collection Time    08/21/13  8:25 PM      Result Value Range   WBC 10.1  4.0 - 10.5 K/uL   RBC 5.74  4.22 - 5.81 MIL/uL   Hemoglobin 15.9  13.0 - 17.0 g/dL   HCT 45.4  39.0 - 52.0 %   MCV 79.1  78.0 - 100.0 fL   MCH 27.7  26.0 - 34.0 pg   MCHC 35.0  30.0 - 36.0 g/dL   RDW 13.4  11.5 - 15.5 %   Platelets 170  150 - 400 K/uL  COMPREHENSIVE METABOLIC PANEL     Status: Abnormal   Collection Time   08/21/13  8:25 PM      Result Value Range   Sodium 147  137 - 147 mEq/L   Potassium 4.0  3.7 - 5.3 mEq/L   Chloride 107  96 - 112 mEq/L   CO2 27  19 - 32 mEq/L   Glucose, Bld 89  70 - 99 mg/dL   BUN 15  6 - 23 mg/dL   Creatinine, Ser 0.86  0.50 - 1.35 mg/dL   Calcium 9.0  8.4 - 10.5 mg/dL   Total Protein 7.6  6.0 - 8.3 g/dL   Albumin 3.9  3.5 - 5.2 g/dL   AST 18  0 - 37 U/L   ALT 14  0 - 53 U/L   Alkaline Phosphatase 59  39 - 117 U/L   Total Bilirubin 0.7  0.3 - 1.2 mg/dL   GFR calc non Af Amer 89 (*) >90 mL/min   GFR calc Af Amer >90  >90 mL/min   Comment: (NOTE)     The eGFR has been calculated using the CKD EPI equation.     This calculation has not  been validated in all clinical situations.     eGFR'Harrell persistently <90 mL/min signify possible Chronic Kidney     Disease.  URINALYSIS W MICROSCOPIC + REFLEX CULTURE     Status: Abnormal   Collection Time    08/21/13  8:30 PM      Result Value Range   Color, Urine YELLOW  YELLOW   APPearance CLEAR  CLEAR   Specific Gravity, Urine 1.018  1.005 - 1.030   pH 7.0  5.0 - 8.0   Glucose, UA NEGATIVE  NEGATIVE mg/dL   Hgb urine dipstick NEGATIVE  NEGATIVE   Bilirubin Urine NEGATIVE  NEGATIVE   Ketones, ur NEGATIVE  NEGATIVE mg/dL   Protein, ur NEGATIVE  NEGATIVE mg/dL   Urobilinogen, UA 4.0 (*) 0.0 - 1.0 mg/dL   Nitrite NEGATIVE  NEGATIVE   Leukocytes, UA NEGATIVE  NEGATIVE   WBC, UA 0-2  <3 WBC/hpf   RBC / HPF 0-2  <3 RBC/hpf   Bacteria, UA RARE  RARE   Squamous Epithelial / LPF RARE  RARE  POCT I-STAT TROPONIN I     Status: None   Collection Time    08/21/13  9:24 PM      Result Value Range   Troponin i, poc 0.01  0.00 - 0.08 ng/mL   Comment 3            Comment: Due to the release kinetics of cTnI,     a negative result within the first hours     of the onset of symptoms does not rule out     myocardial infarction with certainty.     If myocardial infarction is still suspected,     repeat the test at appropriate  intervals.  CG4 I-STAT (LACTIC ACID)     Status: None   Collection Time    08/21/13  9:25 PM      Result Value Range   Lactic Acid, Venous 1.67  0.5 - 2.2 mmol/L   Dg Chest 2 View  08/21/2013   CLINICAL DATA:  Weakness.  EXAM: CHEST  2 VIEW  COMPARISON:  DG CHEST 2 VIEW dated 10/08/2010  FINDINGS: Lateral view degraded by patient arm position. Artifact degraded lateral view posteriorly as well. Patient rotated left. Midline trachea. Borderline cardiomegaly. Mediastinal contours otherwise within normal limits. No pleural effusion or pneumothorax. Clear lungs.  IMPRESSION: Borderline cardiomegaly, without acute disease.   Electronically Signed   By: Abigail Miyamoto M.D.   On: 08/21/2013 22:08   Ct Head (brain) Wo Contrast  08/21/2013   CLINICAL DATA:  WEAKNESS  EXAM: CT HEAD WITHOUT CONTRAST  TECHNIQUE: Contiguous axial images were obtained from the base of the skull through the vertex without intravenous contrast.  COMPARISON:  CT HEAD W/O CM dated 11/21/2012  FINDINGS: No acute intracranial hemorrhage. No focal mass lesion. No CT evidence of acute infarction. No midline shift or mass effect. No hydrocephalus. Basilar cisterns are patent. There is mild periventricular white matter hypodensities similar prior. Mastoid air cells are clear. Mild mucosal thickening in the maxillary sinuses. Orbits are normal.  IMPRESSION: 1. No acute intracranial findings. 2. Mild white matter microvascular disease. 3. Mild maxillary sinus inflammation.   Electronically Signed   By: Suzy Bouchard M.D.   On: 08/21/2013 21:56    ROS Notable for no recent fevers or chills. The patient denies headache, neck pain, shortness of breath, palpitations of the heart, chest pains, abdominal pain, nausea, or vomiting. He denies problems controlling bowels or bladder.  He denies any blackout episodes, seizure-type events, or focal numbness or weakness on the face, arms, or legs.  Blood pressure 144/89, pulse 63, temperature 97.7 F (36.5  C), temperature source Oral, resp. rate 15, SpO2 97.00%.  GENERAL: This patient is a fairly well-developed black male who is alert and cooperative at the time of examination.  HEENT: Head is atraumatic. Eyes: [place]ark, pupils are equal, round, and react to light.  NECK: Supple. No carotid bruits noted.  RESPIRATORY: Clear, bilaterally.  CARDIOVASCULAR: Regular rate and rhythm. No obvious murmurs or rubs noted.  EXTREMITIES: Without significant edema.  NEUROLOGIC: Cranial nerves as above. Facial symmetry is present. He has good strength of facial muscles, motion of the head turn, and shoulder shrug bilaterally. Speech is well enunciated, not  aphasic. The extraocular movements are full. Motor testing shows 5/5 strength in all 4 ext. Good symmetric motor tone is noted throughout.   Assessment/Plan Weakness and gait abnormality.  R/o new stroke  Hypertension  Dyslipidemia   Neuro-consult  MRI/MRA head.  Home medications   Derek Harrell 08/21/2013, 11:30 PM

## 2013-08-21 NOTE — ED Notes (Signed)
Patient transported to CT 

## 2013-08-22 ENCOUNTER — Encounter (HOSPITAL_COMMUNITY): Payer: Self-pay | Admitting: General Practice

## 2013-08-22 ENCOUNTER — Observation Stay (HOSPITAL_COMMUNITY): Payer: Medicare Other

## 2013-08-22 ENCOUNTER — Ambulatory Visit: Payer: Medicare Other | Admitting: Physician Assistant

## 2013-08-22 LAB — BASIC METABOLIC PANEL
BUN: 14 mg/dL (ref 6–23)
CALCIUM: 8.4 mg/dL (ref 8.4–10.5)
CHLORIDE: 107 meq/L (ref 96–112)
CO2: 24 mEq/L (ref 19–32)
CREATININE: 0.83 mg/dL (ref 0.50–1.35)
GFR calc Af Amer: >90 mL/min (ref >90)
GFR calc non Af Amer: >90 mL/min (ref >90)
Glucose, Bld: 118 mg/dL — ABNORMAL HIGH (ref 70–99)
Potassium: 3.5 mEq/L — ABNORMAL LOW (ref 3.7–5.3)
SODIUM: 143 meq/L (ref 137–147)

## 2013-08-22 LAB — CBC
HCT: 41.6 % (ref 39.0–52.0)
Hemoglobin: 14.3 g/dL (ref 13.0–17.0)
MCH: 27.3 pg (ref 26.0–34.0)
MCHC: 34.4 g/dL (ref 30.0–36.0)
MCV: 79.5 fL (ref 78.0–100.0)
PLATELETS: 148 10*3/uL — AB (ref 150–400)
RBC: 5.23 MIL/uL (ref 4.22–5.81)
RDW: 13.4 % (ref 11.5–15.5)
WBC: 7.9 10*3/uL (ref 4.0–10.5)

## 2013-08-22 MED ORDER — ONDANSETRON HCL 4 MG PO TABS: 4.0000 mg | ORAL_TABLET | Freq: Four times a day (QID) | ORAL | Status: DC | PRN

## 2013-08-22 MED ORDER — STROKE: EARLY STAGES OF RECOVERY BOOK
Freq: Once | Status: AC
  Administered 2013-08-22
  Filled 2013-08-22 (×2): qty 1

## 2013-08-22 MED ORDER — HEPARIN SODIUM (PORCINE) 5000 UNIT/ML IJ SOLN
5000.0000 [IU] | Freq: Three times a day (TID) | INTRAMUSCULAR | Status: DC
  Administered 2013-08-22 – 2013-08-23 (×5): 5000 [IU] via SUBCUTANEOUS
  Filled 2013-08-22 (×7): qty 1

## 2013-08-22 MED ORDER — DOCUSATE SODIUM 100 MG PO CAPS
100.0000 mg | ORAL_CAPSULE | Freq: Two times a day (BID) | ORAL | Status: DC
  Administered 2013-08-22 – 2013-08-23 (×3): 100 mg via ORAL
  Filled 2013-08-22 (×4): qty 1

## 2013-08-22 MED ORDER — ADULT MULTIVITAMIN W/MINERALS CH
1.0000 | ORAL_TABLET | Freq: Every day | ORAL | Status: DC
Start: 2013-08-22 — End: 2013-08-23
  Administered 2013-08-22 – 2013-08-23 (×2): 1 via ORAL
  Filled 2013-08-22 (×2): qty 1

## 2013-08-22 MED ORDER — ASPIRIN EC 325 MG PO TBEC
325.0000 mg | DELAYED_RELEASE_TABLET | Freq: Every day | ORAL | Status: DC
  Administered 2013-08-22 – 2013-08-23 (×2): 325 mg via ORAL
  Filled 2013-08-22 (×2): qty 1

## 2013-08-22 MED ORDER — ONDANSETRON HCL 4 MG/2ML IJ SOLN: 4.0000 mg | Freq: Four times a day (QID) | INTRAMUSCULAR | Status: DC | PRN

## 2013-08-22 MED ORDER — ACETAMINOPHEN 325 MG PO TABS: 650.0000 mg | ORAL_TABLET | Freq: Four times a day (QID) | ORAL | Status: DC | PRN

## 2013-08-22 MED ORDER — ALUM & MAG HYDROXIDE-SIMETH 200-200-20 MG/5ML PO SUSP: 30.0000 mL | Freq: Four times a day (QID) | ORAL | Status: DC | PRN

## 2013-08-22 MED ORDER — INFLUENZA VAC SPLIT QUAD 0.5 ML IM SUSP
0.5000 mL | INTRAMUSCULAR | Status: AC
  Administered 2013-08-23: 0.5 mL via INTRAMUSCULAR
  Filled 2013-08-22: qty 0.5

## 2013-08-22 MED ORDER — SODIUM CHLORIDE 0.9 % IJ SOLN
3.0000 mL | Freq: Two times a day (BID) | INTRAMUSCULAR | Status: DC
  Administered 2013-08-22 – 2013-08-23 (×2): 3 mL via INTRAVENOUS

## 2013-08-22 MED ORDER — ACETAMINOPHEN 650 MG RE SUPP
650.0000 mg | Freq: Four times a day (QID) | RECTAL | Status: DC | PRN
Start: 2013-08-22 — End: 2013-08-23

## 2013-08-22 MED ORDER — PNEUMOCOCCAL VAC POLYVALENT 25 MCG/0.5ML IJ INJ
0.5000 mL | INJECTION | INTRAMUSCULAR | Status: AC
  Administered 2013-08-23: 0.5 mL via INTRAMUSCULAR
  Filled 2013-08-22: qty 0.5

## 2013-08-22 NOTE — Progress Notes (Signed)
Subjective:  Patient denies any chest pain or shortness of breath. Denies any further weakness. MRI of the brain showed no evidence of new acute stroke  Objective:  Vital Signs in the last 24 hours: Temp:  [97.7 F (36.5 C)-97.9 F (36.6 C)] 97.9 F (36.6 C) (01/19 0547) Pulse Rate:  [51-77] 51 (01/19 0547) Resp:  [14-18] 15 (01/19 0547) BP: (92-170)/(51-96) 92/51 mmHg (01/19 0547) SpO2:  [94 %-99 %] 96 % (01/19 0547)  Intake/Output from previous [place]:   Intake/Output from this shift:    Physical Exam: Neck: no adenopathy, no carotid bruit, no JVD and supple, symmetrical, trachea midline Lungs: clear to auscultation bilaterally Heart: regular rate and rhythm, S1, S2 normal, no murmur, click, rub or gallop Abdomen: soft, non-tender; bowel sounds normal; no masses,  no organomegaly Extremities: extremities normal, atraumatic, no cyanosis or edema  Lab Results:  Recent Labs  08/21/13 2025 08/22/13 0455  WBC 10.1 7.9  HGB 15.9 14.3  PLT 170 148*    Recent Labs  08/21/13 2025 08/22/13 0455  NA 147 143  K 4.0 3.5*  CL 107 107  CO2 27 24  GLUCOSE 89 118*  BUN 15 14  CREATININE 0.86 0.83   No results found for this basename: TROPONINI, CK, MB,  in the last 72 hours Hepatic Function Panel  Recent Labs  08/21/13 2025  PROT 7.6  ALBUMIN 3.9  AST 18  ALT 14  ALKPHOS 59  BILITOT 0.7   No results found for this basename: CHOL,  in the last 72 hours No results found for this basename: PROTIME,  in the last 72 hours  Imaging: Imaging results have been reviewed and Dg Chest 2 View  08/21/2013   CLINICAL DATA:  Weakness.  EXAM: CHEST  2 VIEW  COMPARISON:  DG CHEST 2 VIEW dated 10/08/2010  FINDINGS: Lateral view degraded by patient arm position. Artifact degraded lateral view posteriorly as well. Patient rotated left. Midline trachea. Borderline cardiomegaly. Mediastinal contours otherwise within normal limits. No pleural effusion or pneumothorax. Clear lungs.   IMPRESSION: Borderline cardiomegaly, without acute disease.   Electronically Signed   By: Abigail Miyamoto M.D.   On: 08/21/2013 22:08   Ct Head (brain) Wo Contrast  08/21/2013   CLINICAL DATA:  WEAKNESS  EXAM: CT HEAD WITHOUT CONTRAST  TECHNIQUE: Contiguous axial images were obtained from the base of the skull through the vertex without intravenous contrast.  COMPARISON:  CT HEAD W/O CM dated 11/21/2012  FINDINGS: No acute intracranial hemorrhage. No focal mass lesion. No CT evidence of acute infarction. No midline shift or mass effect. No hydrocephalus. Basilar cisterns are patent. There is mild periventricular white matter hypodensities similar prior. Mastoid air cells are clear. Mild mucosal thickening in the maxillary sinuses. Orbits are normal.  IMPRESSION: 1. No acute intracranial findings. 2. Mild white matter microvascular disease. 3. Mild maxillary sinus inflammation.   Electronically Signed   By: Suzy Bouchard M.D.   On: 08/21/2013 21:56   Mr Brain Wo Contrast  08/22/2013   CLINICAL DATA:  History of hypertension and stroke years ago. Now presenting with difficulty walking and weakness.  EXAM: MRI HEAD WITHOUT CONTRAST  TECHNIQUE: Multiplanar, multiecho pulse sequences of the brain and surrounding structures were obtained without intravenous contrast.  COMPARISON:  05/16/2009 and 01/29/2008 MR.  FINDINGS: No acute infarct.  Remote pontine infarct.  Tiny area blood breakdown products posterior lateral aspect right thalamus most likely related to prior episode hemorrhagic ischemia otherwise no evidence of intracranial hemorrhage.  Prominent small vessel disease type changes have progressed since prior exam.  No intracranial mass lesion noted on this unenhanced exam.  Mild global atrophy without hydrocephalus.  Mild exophthalmos.  Mild mucosal thickening paranasal sinuses.  Major intracranial vascular structures are patent. Vertebral arteries and basilar artery are ectatic.  Cervical medullary junction,  pituitary region and pineal region unremarkable.  IMPRESSION: No acute infarct.  Remote pontine infarct.  Prominent small vessel disease type changes have progressed since prior exam.  Mild mucosal thickening paranasal sinuses.   Electronically Signed   By: Chauncey Cruel M.D.   On: 08/22/2013 12:19    Cardiac Studies:  Assessment/Plan:  Status post weakness/gait abnormality rule out TIA History of pontine CVA in the past Hypertension Hypercholesteremia History of Parkinson's disease AAA Plan Check 2-D echo and duplex ultrasound of carotids OT PT consult Increase ambulation   LOS: 1 [place]    Bud Kaeser N 08/22/2013, 12:55 PM

## 2013-08-22 NOTE — Progress Notes (Signed)
  Echocardiogram 2D Echocardiogram has been performed.  Derek Harrell 08/22/2013, 12:49 PM

## 2013-08-22 NOTE — Progress Notes (Signed)
UR Completed.  

## 2013-08-23 ENCOUNTER — Ambulatory Visit: Payer: Medicare Other | Admitting: Physical Therapy

## 2013-08-23 NOTE — Progress Notes (Signed)
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  Patient with h/o Parkinson's, h/o CVA, AAA and femur fracture.   Clinical Impression   PTA, pt lived alone and was independent with all ADL and mobility. Attends Parkinson's ex group and works out at [employer]. Discussed home safety and ways to reduce falls. Rec for pt to use shower seat. All education completed. OT signing off.    OT Assessment  Patient does not need any further OT services    Follow Up Recommendations  No OT follow up    Barriers to Discharge      Equipment Recommendations  Tub/shower seat    Recommendations for Other Services    Frequency       Precautions / Restrictions Precautions Precautions: Fall   Pertinent Vitals/Pain no apparent distress     ADL  Transfers/Ambulation Related to ADLs: mod I  ADL Comments: INDEPENDENT WITH ALL adl    OT Diagnosis:    OT Problem List:   OT Treatment Interventions:     OT Goals(Current goals can be found in the care plan section) Acute Rehab OT Goals Patient Stated Goal: go home OT Goal Formulation:  (eval only)  Visit Information  Last OT Received On: 08/23/13 Assistance Needed: +1 History of Present Illness: Patient is a 68 y/o male admitted with weakness, concern for stroke, 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  Patient with h/o Parkinson's, h/o CVA, AAA and femur fracture.       Prior [place] expects to be discharged to:: Private residence Living Arrangements: Alone Available Help at Discharge: Family;Available PRN/intermittently Type of Home: House (Condo on ground floor) Home Access: Level entry Home Layout: One level Home  Equipment: Other (comment);None (ski poles) Prior Function Level of Independence: Independent Comments: Works out at [employer] 2 days a week and attends Parkinson's group one [place] per week.  Planned to start outpatient PT again  Communication Communication: No difficulties Dominant Hand: Right         Vision/Perception Vision - History Baseline Vision: No visual deficits   Cognition  Cognition Arousal/Alertness: Awake/alert Behavior During Therapy: WFL for tasks assessed/performed Overall Cognitive Status: Within Functional Limits for tasks assessed    Extremity/Trunk Assessment Upper Extremity Assessment Upper Extremity Assessment: Overall WFL for tasks assessed Lower Extremity Assessment Lower Extremity Assessment: Defer to PT evaluation Cervical / Trunk Assessment Cervical / Trunk Assessment: Normal     Mobility Bed Mobility Overal bed mobility: Independent Transfers Overall transfer level: Modified independent     Exercise     Balance     End of Session OT - End of Session Activity Tolerance: Patient tolerated treatment well Patient left: in bed;with call bell/phone within reach Nurse Communication: Mobility status  GO Functional Assessment Tool Used: clinical judgement Functional Limitation: Self care Self Care Current Status (E3154): 0 percent impaired, limited or restricted Self Care Goal Status (M0867): 0 percent impaired, limited or restricted Self Care Discharge Status (Y1950): 0 percent impaired, limited or restricted   [facility] 08/23/2013, 5:31 PM [facility], OTR/L  [phone] 08/23/2013

## 2013-08-23 NOTE — Progress Notes (Signed)
VASCULAR LAB PRELIMINARY  PRELIMINARY  PRELIMINARY  PRELIMINARY  Carotid duplex completed.    Preliminary report:  Bilateral:  1-39% ICA stenosis.  Vertebral artery flow is antegrade.     Aditi Rovira, [place], RVS 08/23/2013, 10:21 AM

## 2013-08-23 NOTE — Discharge Instructions (Signed)

## 2013-08-23 NOTE — Evaluation (Signed)
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  Patient with h/o Parkinson's, h/o CVA, AAA and femur fracture.  Clinical Impression  Patient presents with gait disturbances likely unchanged from prior to admission.  States that outpatient PT planned to start soon and already attending Parkinson's exercise group and goes to the [facility] 2x a week.  Feel deficits will be appropriately addressed in outpatient PT, no further skilled acute PT needs at this time.  Did educate patient in energy conservation techniques.    PT Assessment  All further PT needs can be met in the next venue of care    Follow Up Recommendations  Outpatient PT    Does the patient have the potential to tolerate intense rehabilitation    N/A  Barriers to Discharge  None      Equipment Recommendations  None recommended by PT    Recommendations for Other Services   None  Frequency   N/A   Precautions / Restrictions Precautions Precautions: Fall   Pertinent Vitals/Pain No pain complaints      Mobility  Bed Mobility General bed mobility comments: NT patient in chair Transfers Overall transfer level: Modified independent Equipment used: None Ambulation/Gait Ambulation/Gait assistance: Supervision;Min guard Ambulation Distance (Feet): 350 Feet Assistive device: None Gait Pattern/deviations: Step-through pattern;Decreased stride length;Shuffle General Gait Details: decreasing stride length with increased distance, stopping, but not freezing episodes, more due to multitasking with conversation and people passing in hallway    Exercises     PT Diagnosis: Abnormality of gait  PT Problem List: Decreased strength;Decreased  balance;Decreased mobility;Decreased knowledge of use of DME;Decreased coordination PT Treatment Interventions:       PT Goals(Current goals can be found in the care plan section) Acute Rehab PT Goals PT Goal Formulation: No goals set, d/c therapy  Visit Information  Last PT Received On: 08/23/13 Assistance Needed: +1 History of Present Illness: Patient is a 68 y/o male admitted with weakness, concern for stroke, 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  Patient with h/o Parkinson's, h/o CVA, AAA and femur fracture.       Prior [place] expects to be discharged to:: Private residence Living Arrangements: Alone Available Help at Discharge: Family;Available PRN/intermittently Type of Home: House (Condo on ground floor) Home Access: Level entry Home Layout: One level Home Equipment: Other (comment) (ski poles) Prior Function Level of Independence: Independent Comments: Works out at [employer] 2 days a week and attends Parkinson's group one [place] per week.  Planned to start outpatient PT again  Communication Communication: No difficulties    Cognition  Cognition Arousal/Alertness: Awake/alert Behavior During Therapy: WFL for tasks assessed/performed Overall Cognitive Status: Within Functional Limits for tasks assessed    Extremity/Trunk Assessment Upper Extremity Assessment Upper Extremity Assessment: Overall WFL for tasks assessed Lower Extremity Assessment Lower Extremity Assessment: RLE deficits/detail;LLE deficits/detail RLE Deficits / Details: AROM and strength WFL except ankle DF 4-/5, hip flexion 3+/5 LLE Deficits / Details: AROM and strength WFL except ankle DF 4-/5, hip flexion 3+/5   Balance Balance Overall balance assessment: Independent  End of Session PT - End of Session Equipment Utilized During Treatment: Gait belt Activity Tolerance: Patient tolerated treatment well Patient left: in chair;with family/visitor present;with call bell/phone  within  reach  GP Functional Assessment Tool Used: Clinical Judgement Functional Limitation: Mobility: Walking and moving around Mobility: Walking and Moving Around Current Status [phone]): At least 1 percent but less than 20 percent impaired, limited or restricted Mobility: Walking and Moving Around Goal Status [phone]): At least 1 percent but less than 20 percent impaired, limited or restricted Mobility: Walking and Moving Around Discharge Status [phone]): At least 1 percent but less than 20 percent impaired, limited or restricted   [facility] 08/23/2013, 12:45 PM [place], [place] 08/23/2013

## 2013-08-24 NOTE — Discharge Summary (Signed)
NAMECOLON, RUETH                   ACCOUNT NO.:  [id number]  MEDICAL RECORD NO.:  [id number]  LOCATION:  3W25C                        FACILITY:  [facility]  PHYSICIAN:  Katy Brickell N. Terrence Dupont, M.D. DATE OF BIRTH:  1945-09-28  DATE OF ADMISSION:  08/21/2013 DATE OF DISCHARGE:  08/23/2013                              DISCHARGE SUMMARY   ADMITTING DIAGNOSES: 1. Weakness and gait abnormality, rule out cerebrovascular accident. 2. Hypertension. 3. Hypercholesterolemia.  FINAL DIAGNOSES: 1. Status post weakness/gait abnormality probably status post     transient ischemic attack. 2. 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 3. Hypertension. 4. Hypercholesterolemia. 5. History of Parkinson disease. 6. History of abdominal aortic aneurysm. 7. Morbid obesity.  DISCHARGE HOME MEDICATIONS: 1. Amlodipine 5 mg 1 tablet daily. 2. Aspirin 81 mg 1 tablet daily. 3. Clopidogrel 75 mg 1 tablet daily. 4. Linzess 290 mcg by mouth daily. 5. Multivitamin with mineral 1 tablet daily. 6. Bystolic 10 mg 1 tablet daily. 7. Dextromethorphan/quinidine 1 tablet by mouth twice daily as needed. 8. Crestor 10 mg 1 tablet daily.  DIET:  Low salt, low cholesterol.  ACTIVITY:  Increase activity slowly as tolerated.  FOLLOWUP:  Follow up with me in 1 week.  CONDITION AT DISCHARGE:  Stable.  BRIEF HISTORY AND HOSPITAL COURSE:  [name] is a 68 year old male with past medical history significant for hypertension, history of stroke, several years ago, hypercholesteremia, abdominal aortic aneurysm, gait disorder, morbid obesity, abdominal aortic aneurysm, was admitted by Dr. Doylene Canard because of difficulty walking this morning along with weakness with no difficulty with vision or speech.  His memory appears to be intact.  His prior stroke left him with deficit of feeling dizzy when he closes the eyes, but had no difficulty ambulating with open eyes.  He is very vague with his history, but states he has  been like that all morning.  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  MRI today is pending.  PHYSICAL EXAMINATION:  GENERAL:  He was alert, awake, oriented x3. VITAL SIGNS:  Blood pressure was 144/89, pulse was 63, he was afebrile. HEENT:  Conjunctivae was pink.  Pupils were equal, round, and reacting to light. NECK:  Supple.  No JVD.  No bruit. LUNGS:  Clear to auscultation bilaterally. CARDIOVASCULAR:  S1, S2 were normal.  There was no obvious murmur or rub. EXTREMITIES:  There was no clubbing, cyanosis, or edema. NEURO:  Exam was essentially normal.  LABORATORY DATA:  Sodium was 147, potassium 4.0, BUN 15, creatinine 0.86.  Hemoglobin was 15.9, hematocrit 45.4, white count of 10.1.  CT of the brain showed no acute intracranial finding, mild white matter microvascular disease, mild maxillary sinus inflammation.  The patient also underwent an MRI of the brain which showed no acute infarct, remote pontine infarct, prominent small-vessel disease type changes have progressed since prior exam.  Mild mucosal thickening in paranasal sinuses.  2-D echo showed good LV systolic function.  No wall motion abnormalities.  No evidence of thrombus in this transthoracic study. Carotid duplex ultrasound showed no evidence of critical internal carotid artery stenosis.  BRIEF HOSPITAL COURSE:  The patient was admitted  to telemetry unit. OT/PT consultation was obtained.  The patient did not have any further episodes of weakness or gait abnormality during the hospital stay.  The patient has been ambulating in room and hallway without any problems. His neuro workup has been negative so far.  The patient will be discharged home on above medications and will be followed up in my office in 1 week.     Allegra Lai. Terrence Dupont, M.D.     MNH/MEDQ  D:  08/23/2013  T:  08/24/2013  Job:  [id number]

## 2013-08-26 ENCOUNTER — Ambulatory Visit: Payer: Medicare Other | Admitting: Physical Therapy

## 2013-08-30 ENCOUNTER — Ambulatory Visit (INDEPENDENT_AMBULATORY_CARE_PROVIDER_SITE_OTHER): Payer: Medicare Other | Admitting: Physician Assistant

## 2013-08-30 ENCOUNTER — Encounter: Payer: Self-pay | Admitting: Physician Assistant

## 2013-08-30 VITALS — BP 120/80 | HR 67 | Ht 73.0 in | Wt 228.0 lb

## 2013-08-30 DIAGNOSIS — I10 Essential (primary) hypertension: Secondary | ICD-10-CM

## 2013-08-30 DIAGNOSIS — G4700 Insomnia, unspecified: Secondary | ICD-10-CM

## 2013-08-30 DIAGNOSIS — G4710 Hypersomnia, unspecified: Secondary | ICD-10-CM

## 2013-08-30 DIAGNOSIS — I714 Abdominal aortic aneurysm, without rupture, unspecified: Secondary | ICD-10-CM

## 2013-08-30 DIAGNOSIS — E669 Obesity, unspecified: Secondary | ICD-10-CM

## 2013-08-30 DIAGNOSIS — G4719 Other hypersomnia: Secondary | ICD-10-CM

## 2013-08-30 NOTE — Assessment & Plan Note (Signed)
I suspect the patient's suffering from sleep apnea. It appears that since the last study for this was greater than 10 years ago and I think we need to reorder a sleep study.  If it is positive for sleep apnea, will have him follow with Dr. Claiborne Billings.

## 2013-08-30 NOTE — Assessment & Plan Note (Signed)
A CT of the abdomen was February 2014. The aneurysm measured 3.7 x 3.6 cm.

## 2013-08-30 NOTE — Assessment & Plan Note (Signed)
Recommended referral for medical nutrition therapy

## 2013-08-30 NOTE — Patient Instructions (Signed)
1.  We will schedule a sleep study and follow up with Dr. Claiborne Billings if a CPAP device is recommended.  Other wise, we will have you see Dr. Gwenlyn Found in six months.

## 2013-08-30 NOTE — Assessment & Plan Note (Signed)
Blood pressure is well-controlled at this time.

## 2013-08-30 NOTE — Progress Notes (Signed)
Date:  08/30/2013   ID:  Deidre Ala Hustead, DOB Jan 25, 1946, MRN [id number]  PCP:  Clent Demark, MD  Primary Cardiologist:  Gwenlyn Found     History of Present Illness: Tavien Kleen is a 68 y.o. obese divorced African American male, father of 39, grandfather to 5 grandchildren, referred to Dr. Gwenlyn Found through the courtesy of Dr. Pricilla Holm at [facility] for evaluation of an incidentally-noted small abdominal aortic aneurysm on CT scanning.   The patient lives alone. He is retired from [employer], where he worked for 39 years. His cardiac risk factor profile is positive for hypertension and hyperlipidemia. He quit smoking 15 years ago and smoked 1/2 to 1 pack a Leon for 25 years prior to that. He does have a family history of heart disease with a father who died of an MI at age 66. He has never had a heart attack, but has had a stroke 4 to 5 years ago without residual neurologic disability.  A CT scan was done for evaluation of abdominal pain, which was unremarkable except for the above-noted small abdominal aortic aneurysm.   Patient was recently hospitalized weakness and gait abnormality.  MR of the brain without contrast showed no acute infarct but there is a remote pontine infarct.  He continues to complain of some weakness and feeling tired.  He also reports that if he sits down during the [place] he will immediately fall asleep.  And his grand children complain about him snoring.  He does live by himself was no reported apneic events appear he does have sleep apnea listed in his medical history he reports having a sleep study that was maybe 10 years ago. He says he was given a device that is the same as the nasal cannula hospital. It does not appear that he was ever started on ACE CPAP machine however, there is a comment under past medical history says the CPAP did not work for him.  His last 2-D echocardiogram was 08/22/2013 and showed normal ejection fraction and wall motion. Did have peak PA pressures  mildly elevated at 32 mmHg  The patient currently denies nausea, vomiting, fever, chest pain, shortness of breath, orthopnea, dizziness, PND, cough, congestion, abdominal pain, hematochezia, melena, lower extremity edema, claudication.  Wt Readings from Last 3 Encounters:  08/30/13 228 lb (103.42 kg)  08/23/13 223 lb (101.152 kg)  04/12/13 231 lb (104.781 kg)     Past Medical History  Diagnosis Date  . Hypertension   . Abdominal aortic aneurysm   . Hyperlipidemia   . Cerebrovascular disease   . Gait disorder   . Constipation   . 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 Pseudobulbar affect   . Dizziness   . Abnormality of gait 04/12/2013  . Obesity   . 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 Sleep apnea     "I tried CPAP; didn't work for me" (08/22/2013)    Current Outpatient Prescriptions  Medication Sig Dispense Refill  . amLODipine (NORVASC) 5 MG tablet Take 5 mg by mouth daily.      Marland Kitchen aspirin 81 MG EC tablet Take 1 tablet (81 mg total) by mouth daily. Swallow whole.  30 tablet  12  . clopidogrel (PLAVIX) 75 MG tablet Take 75 mg by mouth daily.      . Linaclotide (LINZESS) 290 MCG CAPS Take 290 mcg by mouth daily.  30 capsule  6  . Multiple Vitamin (MULTIVITAMIN WITH MINERALS) TABS tablet  Take 1 tablet by mouth daily.      . nebivolol (BYSTOLIC) 10 MG tablet Take 10 mg by mouth daily.      . rosuvastatin (CRESTOR) 10 MG tablet Take 10 mg by mouth daily.       No current facility-administered medications for this visit.    Allergies:   No Known Allergies  Social History:  The patient  reports that he quit smoking about 20 years ago. His smoking use included Cigarettes. He has a 1.56 pack-year smoking history. He has never used smokeless tobacco. He reports that he drinks alcohol. He reports that he does not use illicit drugs.   Family history:   Family History  Problem Relation Age of Onset  . Colon cancer Brother     ROS:  Please see the  history of present illness.  All other systems reviewed and negative.   PHYSICAL EXAM: VS:  BP 120/80  Pulse 67  Ht 6\' 1"  (1.854 m)  Wt 228 lb (103.42 kg)  BMI 30.09 kg/m2 Obese, well developed, in no acute distress HEENT: Pupils are equal round react to light accommodation extraocular movements are intact.  Neck: no JVDNo cervical lymphadenopathy. Cardiac: Regular rate and rhythm without murmurs rubs or gallops. Lungs:  clear to auscultation bilaterally, no wheezing, rhonchi or rales Abd: soft, nontender, positive bowel sounds all quadrants, no hepatosplenomegaly Ext: Trace lower extremity edema.  2+ radial and dorsalis pedis pulses. Skin: warm and dry Neuro:  Grossly normal  EKG:  Normal sinus rhythm. Rate 67 beats per minute as a negative index elected P wave in lead 3 and biphasic T wave in V1 indicating possible left atrial enlargement.  ASSESSMENT AND PLAN:  Problem List Items Addressed This Visit   Abdominal aortic aneurysm.  3.7 x 3.6 cm infrarenal abdominal aortic aneurysm.     A CT of the abdomen was February 2014. The aneurysm measured 3.7 x 3.6 cm.     Essential hypertension     Blood pressure is well-controlled at this time.    Excessive daytime sleepiness     I suspect the patient's suffering from sleep apnea. It appears that since the last study for this was greater than 10 years ago and I think we need to reorder a sleep study.  If it is positive for sleep apnea, will have him follow with Dr. Claiborne Billings.    Obesity (BMI 30-39.9)     Recommended referral for medical nutrition therapy     Other Visit Diagnoses   HTN (hypertension)    -  Primary    Relevant Orders       EKG 12-Lead    Insomnia        Relevant Orders       Nocturnal polysomnography (NPSG)

## 2013-08-31 ENCOUNTER — Ambulatory Visit: Payer: Medicare Other | Admitting: Physical Therapy

## 2013-09-02 ENCOUNTER — Encounter (HOSPITAL_COMMUNITY): Payer: Self-pay | Admitting: *Deleted

## 2013-09-06 ENCOUNTER — Ambulatory Visit: Payer: Medicare Other | Attending: Neurology | Admitting: Physical Therapy

## 2013-09-06 ENCOUNTER — Telehealth: Payer: Self-pay | Admitting: Neurology

## 2013-09-06 DIAGNOSIS — IMO0001 Reserved for inherently not codable concepts without codable children: Secondary | ICD-10-CM | POA: Insufficient documentation

## 2013-09-06 DIAGNOSIS — R269 Unspecified abnormalities of gait and mobility: Secondary | ICD-10-CM | POA: Insufficient documentation

## 2013-09-06 DIAGNOSIS — G20 Parkinson's disease: Secondary | ICD-10-CM | POA: Insufficient documentation

## 2013-09-06 DIAGNOSIS — G20A1 Parkinson's disease without dyskinesia, without mention of fluctuations: Secondary | ICD-10-CM | POA: Insufficient documentation

## 2013-09-06 NOTE — Telephone Encounter (Signed)
Called patient and left message to call back to schedule an appt with Dr. [name], per Dr. [name]. Patient has an appt. With Dr. [name] on 08/17/13, but patient cancelled it. I advised the patient that if he has any other problems, questions or concerns to call the office.

## 2013-09-06 NOTE — Telephone Encounter (Signed)
Correction patient had an appt with Dr. [name] on 08/17/13, but cancelled.

## 2013-09-06 NOTE — Telephone Encounter (Signed)
I called patient. The patient recently was in the hospital with a workup for fatigue and decreased functional level. No abnormalities were found. I saw patient last in September of 2014, and I have set up a second opinion visit through Dr. [name]. Apparently this never got scheduled. I'll reschedule this appointment. The patient is wondering whether he should go back on Sinemet again.

## 2013-09-06 NOTE — Telephone Encounter (Signed)
Called patient to get more information concerning the medication that he is requesting to be put back on. Patient stated that he does not know the name of the medication, but it was for his parkinson disease. Patient has an appt on 10/12/13. Patient would like Dr. [name] to give him a call, please. Thanks

## 2013-09-06 NOTE — Telephone Encounter (Signed)
Patient calling to state that he would like to discuss with Dr. [name] about going back on his Parkinson's medication. Please call the patient.

## 2013-09-08 ENCOUNTER — Ambulatory Visit (INDEPENDENT_AMBULATORY_CARE_PROVIDER_SITE_OTHER): Payer: 59 | Admitting: Neurology

## 2013-09-08 ENCOUNTER — Ambulatory Visit: Payer: Medicare Other | Admitting: Physical Therapy

## 2013-09-08 ENCOUNTER — Encounter: Payer: Self-pay | Admitting: Neurology

## 2013-09-08 VITALS — BP 144/87 | HR 67 | Ht 72.0 in | Wt 231.0 lb

## 2013-09-08 DIAGNOSIS — F482 Pseudobulbar affect: Secondary | ICD-10-CM

## 2013-09-08 DIAGNOSIS — K5900 Constipation, unspecified: Secondary | ICD-10-CM

## 2013-09-08 DIAGNOSIS — R269 Unspecified abnormalities of gait and mobility: Secondary | ICD-10-CM

## 2013-09-08 DIAGNOSIS — G20 Parkinson's disease: Secondary | ICD-10-CM

## 2013-09-08 MED ORDER — CARBIDOPA-LEVODOPA 25-100 MG PO TABS: 1.0000 | ORAL_TABLET | Freq: Four times a day (QID) | ORAL | Status: DC

## 2013-09-08 MED ORDER — CARBIDOPA-LEVODOPA ER 50-200 MG PO TBCR: 1.0000 | EXTENDED_RELEASE_TABLET | Freq: Every day | ORAL | Status: DC
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
## 2013-09-08 NOTE — Patient Instructions (Signed)
Overall you are doing fairly well but I do want to suggest a few things today:   Remember to drink plenty of fluid, eat healthy meals and do not skip any meals. Try to eat protein with a every meal and eat a healthy snack such as fruit or nuts in between meals. Try to keep a regular sleep-wake schedule and try to exercise daily, particularly in the form of walking, 20-30 minutes a [place], if you can.   As far as your medications are concerned, I would like to suggest the following: 1)We are going to restart you on the SInemet 25-100 using the following schedule: -Week 1: take 1/2 tablet 4 times a [place] for 1 week and then increase to 1 tablet 4 times a [place] at 8am, noon, 4pm and 8pm. After 4 weeks give our office a call and let us know how you are feeling.   Please continue to take your daily aspirin. Work with your primary care doctor to manage your blood pressure  I would like to see you back in 4 months, sooner if we need to. Please call us with any interim questions, concerns, problems, updates or refill requests.  My clinical assistant and will answer any of your questions and relay your messages to me and also relay most of my messages to you.   Our phone number is [phone]. We also have an after hours call service for urgent matters and there is a physician on-call for urgent questions. For any emergencies you know to call 911 or go to the nearest emergency room

## 2013-09-12 ENCOUNTER — Ambulatory Visit: Payer: Medicare Other | Admitting: Physical Therapy

## 2013-09-14 ENCOUNTER — Ambulatory Visit: Payer: Medicare Other | Admitting: Physical Therapy

## 2013-09-15 ENCOUNTER — Telehealth: Payer: Self-pay | Admitting: Neurology

## 2013-09-15 NOTE — Telephone Encounter (Signed)
Patient calling regarding a question about the dosage for carbidopa/levodopa 4 times daily. Please call to advise.

## 2013-09-15 NOTE — Telephone Encounter (Signed)
Called patient he had 25/100 and 50/200. Directions were to take 1 tab qid. He was unclear on which one to take. Per patient chart he is to take 25/100 qid. He will put 50/200 in cabinet and use after completion. Explained those could be cut in half and he would take qid as well 1/2 tab.

## 2013-09-19 ENCOUNTER — Ambulatory Visit: Payer: Medicare Other | Admitting: Physical Therapy

## 2013-09-21 ENCOUNTER — Ambulatory Visit: Payer: Medicare Other | Admitting: Physical Therapy

## 2013-09-26 ENCOUNTER — Ambulatory Visit: Payer: Medicare Other | Admitting: Physical Therapy

## 2013-09-27 ENCOUNTER — Encounter (HOSPITAL_COMMUNITY): Payer: Medicare Other

## 2013-09-28 ENCOUNTER — Ambulatory Visit: Payer: Medicare Other | Admitting: Physical Therapy

## 2013-09-30 ENCOUNTER — Ambulatory Visit (HOSPITAL_COMMUNITY)
Admission: RE | Admit: 2013-09-30 | Discharge: 2013-09-30 | Disposition: A | Discharge status: Home / Self Care | Payer: Medicare Other | Source: Ambulatory Visit | Attending: Cardiovascular Disease | Admitting: Cardiovascular Disease

## 2013-09-30 DIAGNOSIS — I714 Abdominal aortic aneurysm, without rupture, unspecified: Secondary | ICD-10-CM | POA: Insufficient documentation

## 2013-09-30 NOTE — Progress Notes (Signed)
Abdominal Aortic Duplex Completed °Brianna L Mazza,RVT °

## 2013-10-03 ENCOUNTER — Ambulatory Visit: Payer: Medicare Other | Attending: Neurology | Admitting: Physical Therapy

## 2013-10-03 DIAGNOSIS — G20A1 Parkinson's disease without dyskinesia, without mention of fluctuations: Secondary | ICD-10-CM | POA: Insufficient documentation

## 2013-10-03 DIAGNOSIS — R269 Unspecified abnormalities of gait and mobility: Secondary | ICD-10-CM | POA: Insufficient documentation

## 2013-10-03 DIAGNOSIS — IMO0001 Reserved for inherently not codable concepts without codable children: Secondary | ICD-10-CM | POA: Insufficient documentation

## 2013-10-03 DIAGNOSIS — G20 Parkinson's disease: Secondary | ICD-10-CM | POA: Insufficient documentation

## 2013-10-06 ENCOUNTER — Ambulatory Visit: Payer: Medicare Other | Admitting: Physical Therapy

## 2013-10-09 ENCOUNTER — Telehealth: Payer: Self-pay | Admitting: *Deleted

## 2013-10-09 ENCOUNTER — Encounter: Payer: Self-pay | Admitting: *Deleted

## 2013-10-09 DIAGNOSIS — I714 Abdominal aortic aneurysm, without rupture, unspecified: Secondary | ICD-10-CM

## 2013-10-09 NOTE — Telephone Encounter (Signed)
Order placed for repeat AAA doppler in 6 months

## 2013-10-09 NOTE — Telephone Encounter (Signed)
Message copied by Chauncy Lean on Sun Oct 09, 2013 10:08 PM ------      Message from: Lorretta Harp      Created: Sun Oct 09, 2013  5:38 PM       No change from prior study. Repeat in 6 months ------

## 2013-10-10 ENCOUNTER — Ambulatory Visit: Payer: Medicare Other | Admitting: Physical Therapy

## 2013-10-12 ENCOUNTER — Ambulatory Visit (INDEPENDENT_AMBULATORY_CARE_PROVIDER_SITE_OTHER): Payer: 59 | Admitting: Neurology

## 2013-10-12 ENCOUNTER — Encounter: Payer: Self-pay | Admitting: Neurology

## 2013-10-12 DIAGNOSIS — R269 Unspecified abnormalities of gait and mobility: Secondary | ICD-10-CM

## 2013-10-12 DIAGNOSIS — F482 Pseudobulbar affect: Secondary | ICD-10-CM
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
## 2013-10-12 NOTE — Patient Instructions (Signed)

## 2013-10-13 ENCOUNTER — Ambulatory Visit: Payer: Medicare Other | Admitting: Physical Therapy

## 2013-10-17 ENCOUNTER — Ambulatory Visit: Payer: Medicare Other | Admitting: Physical Therapy

## 2013-10-18 ENCOUNTER — Ambulatory Visit: Payer: Medicare Other | Admitting: Physical Therapy

## 2013-10-20 ENCOUNTER — Ambulatory Visit: Payer: Medicare Other | Admitting: Physical Therapy

## 2013-10-24 ENCOUNTER — Ambulatory Visit: Payer: Medicare Other | Admitting: Physical Therapy

## 2013-10-27 ENCOUNTER — Ambulatory Visit: Payer: Medicare Other | Admitting: Physical Therapy

## 2013-10-31 ENCOUNTER — Telehealth: Payer: Self-pay | Admitting: Neurology

## 2013-10-31 NOTE — Telephone Encounter (Signed)
Patient called to state that he went to his pharmacy to pick up the medication that was recently prescribed but it was $300 and he could not afford it. He called his insurance company and [employer] will fax [employer] a form to support the medical necessity for the  prescription Nuedexta and it needs to be filled out and faxed back within 72 hours.  He just wanted to give [place] a heads up.

## 2013-10-31 NOTE — Telephone Encounter (Signed)
I have sent all requested info to ins.  Pending response.

## 2013-12-02 NOTE — Telephone Encounter (Signed)
Encounter Closed 12/02/13 TP 

## 2013-12-28 ENCOUNTER — Telehealth: Payer: Self-pay | Admitting: Cardiovascular Disease

## 2014-01-06 NOTE — Telephone Encounter (Signed)
Closed encounter °

## 2014-01-09 ENCOUNTER — Encounter: Payer: Self-pay | Admitting: Neurology

## 2014-01-09 ENCOUNTER — Ambulatory Visit (INDEPENDENT_AMBULATORY_CARE_PROVIDER_SITE_OTHER): Payer: Medicare Other | Admitting: Neurology

## 2014-01-09 VITALS — BP 110/71 | HR 56 | Ht 72.0 in | Wt 221.0 lb

## 2014-01-09 DIAGNOSIS — G20 Parkinson's disease: Secondary | ICD-10-CM

## 2014-01-09 DIAGNOSIS — F482 Pseudobulbar affect: Secondary | ICD-10-CM

## 2014-01-09 DIAGNOSIS — R269 Unspecified abnormalities of gait and mobility: Secondary | ICD-10-CM
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
## 2014-01-09 NOTE — Patient Instructions (Signed)
Overall you are doing fairly well but I do want to suggest a few things today:   Remember to drink plenty of fluid, eat healthy meals and do not skip any meals. Try to eat protein with a every meal and eat a healthy snack such as fruit or nuts in between meals. Try to keep a regular sleep-wake schedule and try to exercise daily, particularly in the form of walking, 20-30 minutes a [place], if you can.   I suspect that your symptoms are related to a "vascular parkinsonism". Therefore we will try to taper you off of the Sinemet.   Taper the Sinemet using the following schedule: 1)Week 1, 2 and 3 decrease to one tablet three times a [place] then Week 4, 5 and 6, decrease to 1/2 tablet 3 times a [place] then Week 7 and 8 decrease to 1/2 tablet twice a [place] then stay on that dose until you see Dr Ollen Barges  Follow up with Dr [name] at the end of August. Please call us with any interim questions, concerns, problems, updates or refill requests.   Please also call us for any test results so we can go over those with you on the phone.  My clinical assistant and will answer any of your questions and relay your messages to me and also relay most of my messages to you.   Our phone number is [phone]. We also have an after hours call service for urgent matters and there is a physician on-call for urgent questions. For any emergencies you know to call 911 or go to the nearest emergency room

## 2014-01-21 ENCOUNTER — Other Ambulatory Visit: Payer: Self-pay | Admitting: Neurology

## 2014-02-21 ENCOUNTER — Encounter: Payer: Self-pay | Admitting: Cardiovascular Disease

## 2014-02-21 ENCOUNTER — Ambulatory Visit (INDEPENDENT_AMBULATORY_CARE_PROVIDER_SITE_OTHER): Payer: Medicare Other | Admitting: Cardiovascular Disease

## 2014-02-21 VITALS — BP 128/90 | HR 55 | Ht 73.0 in | Wt 222.0 lb

## 2014-02-21 DIAGNOSIS — Z79899 Other long term (current) drug therapy: Secondary | ICD-10-CM

## 2014-02-21 DIAGNOSIS — I714 Abdominal aortic aneurysm, without rupture, unspecified: Secondary | ICD-10-CM

## 2014-02-21 DIAGNOSIS — E785 Hyperlipidemia, unspecified: Secondary | ICD-10-CM

## 2014-02-21 DIAGNOSIS — I10 Essential (primary) hypertension: Secondary | ICD-10-CM

## 2014-02-21 NOTE — Assessment & Plan Note (Signed)
Recently checked by duplex ultrasound in February revealing a dimension of 3.8 x 3.8, mildly increased since prior study but still small. Recheck in 6 months

## 2014-02-21 NOTE — Assessment & Plan Note (Signed)
On statin therapy, we'll recheck a lipid and liver profile

## 2014-02-21 NOTE — Assessment & Plan Note (Signed)
Controlled on current medications 

## 2014-02-21 NOTE — Patient Instructions (Signed)
  We will see you back in follow up in 1 year with Dr Berry.   Dr Berry has ordered: Your physician recommends that you return for a FASTING lipid profile    

## 2014-02-21 NOTE — Progress Notes (Signed)
02/21/2014 Derek Harrell   1945-11-27  [id number]  Primary Physician Clent Demark, MD Primary Cardiologist: Lorretta Harp MD [name]   HPI:  The patient is a delightful 68 year old moderately overweight divorced African American male, father of 34, grandfather to 5 grandchildren, referred through the courtesy of Dr. Pricilla Holm at [facility] for evaluation of an incidentally-noted small abdominal aortic aneurysm on CT scanning.   The patient lives alone. He is retired from [employer], where he worked for 39 years. His cardiac risk factor profile is positive for hypertension and hyperlipidemia. He quit smoking 15 years ago and smoked 1/2 to 1 pack a Camps for 25 years prior to that. He does have a family history of heart disease with a father who died of an MI at age 35. He has never had a heart attack, but has had a stroke 4 to 5 years ago without residual neurologic disability. He denies chest pain or shortness of breath. A CT scan was done for evaluation of abdominal pain, which was unremarkable except for the above-noted small abdominal aortic aneurysm.  Since I saw him 12 months ago he denies chest pain and shortness of breath. Recent abdominal ultrasound performed 4 months ago revealed his aneurysm to have remained stable at 3.8 cm.    Current Outpatient Prescriptions  Medication Sig Dispense Refill  . amLODipine (NORVASC) 10 MG tablet Take 10 mg by mouth daily.      Marland Kitchen aspirin 81 MG EC tablet Take 1 tablet (81 mg total) by mouth daily. Swallow whole.  30 tablet  12  . carbidopa-levodopa (SINEMET IR) 25-100 MG per tablet Take 1 tablet by mouth 4 (four) times daily.  270 tablet  3  . clopidogrel (PLAVIX) 75 MG tablet Take 75 mg by mouth daily.      Marland Kitchen LINZESS 290 MCG CAPS capsule TAKE ONE CAPSULE BY MOUTH DAILY  30 capsule  6  . nebivolol (BYSTOLIC) 10 MG tablet Take 10 mg by mouth daily.      Marland Kitchen NUEDEXTA 20-10 MG CAPS       . rosuvastatin (CRESTOR) 10 MG tablet  Take 10 mg by mouth daily.       No current facility-administered medications for this visit.    No Known Allergies  History   Social History  . Marital Status: Divorced    Spouse Name: N/A    Number of Children: 3  . Years of Education: college   Occupational History  .  [employer]   Social History Main Topics  . Smoking status: Former Smoker -- 0.06 packs/[place] for 26 years    Types: Cigarettes    Quit date: 03/23/1993  . Smokeless tobacco: Never Used  . Alcohol Use: Yes     Comment: 08/22/2013 "mixed drink or glass of wine or beer once/wk"  . Drug Use: No  . Sexual Activity: Yes   Other Topics Concern  . Not on file   Social History Narrative   Patient is single, has 3 children   Patient is right handed   Education level is some college   Caffeine consumption is 2 cups daily     Review of Systems: General: negative for chills, fever, night sweats or weight changes.  Cardiovascular: negative for chest pain, dyspnea on exertion, edema, orthopnea, palpitations, paroxysmal nocturnal dyspnea or shortness of breath Dermatological: negative for rash Respiratory: negative for cough or wheezing Urologic: negative for hematuria Abdominal: negative for nausea, vomiting, diarrhea, bright red blood per  rectum, melena, or hematemesis Neurologic: negative for visual changes, syncope, or dizziness All other systems reviewed and are otherwise negative except as noted above.    Blood pressure 128/90, pulse 55, height 6\' 1"  (1.854 m), weight 222 lb (100.699 kg).  General appearance: alert and no distress Neck: no adenopathy, no carotid bruit, no JVD, supple, symmetrical, trachea midline and thyroid not enlarged, symmetric, no tenderness/mass/nodules Lungs: clear to auscultation bilaterally Heart: regular rate and rhythm, S1, S2 normal, no murmur, click, rub or gallop Extremities: extremities normal, atraumatic, no cyanosis or edema  EKG sinus bradycardia 55 without ST or  T wave changes  ASSESSMENT AND PLAN:   Abdominal aortic aneurysm.  3.7 x 3.6 cm infrarenal abdominal aortic aneurysm. Recently checked by duplex ultrasound in February revealing a dimension of 3.8 x 3.8, mildly increased since prior study but still small. Recheck in 6 months  Essential hypertension Controlled on current medications  Hyperlipidemia On statin therapy, we'll recheck a lipid and liver profile      Lorretta Harp MD [facility], [facility] 02/21/2014 11:29 AM

## 2014-04-03 ENCOUNTER — Ambulatory Visit (INDEPENDENT_AMBULATORY_CARE_PROVIDER_SITE_OTHER): Payer: Medicare Other | Admitting: Neurology

## 2014-04-03 ENCOUNTER — Encounter: Payer: Self-pay | Admitting: Neurology

## 2014-04-03 DIAGNOSIS — F482 Pseudobulbar affect: Secondary | ICD-10-CM

## 2014-04-03 DIAGNOSIS — R269 Unspecified abnormalities of gait and mobility: Secondary | ICD-10-CM

## 2014-04-03 NOTE — Patient Instructions (Addendum)
With the sinemet(carbidopa) take 1/2 tablet three times a Jeng for 2 weeks, then go to one tablet three times a Mauzy for 3 weeks, then take one tablet 4 times a Kutsch  Fall Prevention and Home Safety Falls cause injuries and can affect all age groups. It is possible to use preventive measures to significantly decrease the likelihood of falls. There are many simple measures which can make your home safer and prevent falls. OUTDOORS  Repair cracks and edges of walkways and driveways.  Remove high doorway thresholds.  Trim shrubbery on the main path into your home.  Have good outside lighting.  Clear walkways of tools, rocks, debris, and clutter.  Check that handrails are not broken and are securely fastened. Both sides of steps should have handrails.  Have leaves, snow, and ice cleared regularly.  Use sand or salt on walkways during winter months.  In the garage, clean up grease or oil spills. BATHROOM  Install night lights.  Install grab bars by the toilet and in the tub and shower.  Use non-skid mats or decals in the tub or shower.  Place a plastic non-slip stool in the shower to sit on, if needed.  Keep floors dry and clean up all water on the floor immediately.  Remove soap buildup in the tub or shower on a regular basis.  Secure bath mats with non-slip, double-sided rug tape.  Remove throw rugs and tripping hazards from the floors. BEDROOMS  Install night lights.  Make sure a bedside light is easy to reach.  Do not use oversized bedding.  Keep a telephone by your bedside.  Have a firm chair with side arms to use for getting dressed.  Remove throw rugs and tripping hazards from the floor. KITCHEN  Keep handles on pots and pans turned toward the center of the stove. Use back burners when possible.  Clean up spills quickly and allow time for drying.  Avoid walking on wet floors.  Avoid hot utensils and knives.  Position shelves so they are not too high or  low.  Place commonly used objects within easy reach.  If necessary, use a sturdy step stool with a grab bar when reaching.  Keep electrical cables out of the way.  Do not use floor polish or wax that makes floors slippery. If you must use wax, use non-skid floor wax.  Remove throw rugs and tripping hazards from the floor. STAIRWAYS  Never leave objects on stairs.  Place handrails on both sides of stairways and use them. Fix any loose handrails. Make sure handrails on both sides of the stairways are as long as the stairs.  Check carpeting to make sure it is firmly attached along stairs. Make repairs to worn or loose carpet promptly.  Avoid placing throw rugs at the top or bottom of stairways, or properly secure the rug with carpet tape to prevent slippage. Get rid of throw rugs, if possible.  Have an electrician put in a light switch at the top and bottom of the stairs. OTHER FALL PREVENTION TIPS  Wear low-heel or rubber-soled shoes that are supportive and fit well. Wear closed toe shoes.  When using a stepladder, make sure it is fully opened and both spreaders are firmly locked. Do not climb a closed stepladder.  Add color or contrast paint or tape to grab bars and handrails in your home. Place contrasting color strips on first and last steps.  Learn and use mobility aids as needed. Install an electrical emergency response  system.  Turn on lights to avoid dark areas. Replace light bulbs that burn out immediately. Get light switches that glow.  Arrange furniture to create clear pathways. Keep furniture in the same place.  Firmly attach carpet with non-skid or double-sided tape.  Eliminate uneven floor surfaces.  Select a carpet pattern that does not visually hide the edge of steps.  Be aware of all pets. OTHER HOME SAFETY TIPS  Set the water temperature for 120 F (48.8 C).  Keep emergency numbers on or near the telephone.  Keep smoke detectors on every level of the  home and near sleeping areas. Document Released: 07/11/2002 Document Revised: 01/20/2012 Document Reviewed: 10/10/2011 [facility] Patient Information 2015 [place], [place]. This information is not intended to replace advice given to you by your health care provider. Make sure you discuss any questions you have with your health care provider.
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
## 2014-04-18 ENCOUNTER — Other Ambulatory Visit: Payer: Self-pay

## 2014-04-25 ENCOUNTER — Ambulatory Visit: Payer: Medicare Other | Attending: Neurology | Admitting: Physical Therapy

## 2014-04-25 DIAGNOSIS — IMO0001 Reserved for inherently not codable concepts without codable children: Secondary | ICD-10-CM | POA: Insufficient documentation

## 2014-04-25 DIAGNOSIS — R269 Unspecified abnormalities of gait and mobility: Secondary | ICD-10-CM | POA: Insufficient documentation

## 2014-04-25 DIAGNOSIS — G20 Parkinson's disease: Secondary | ICD-10-CM | POA: Insufficient documentation

## 2014-04-25 DIAGNOSIS — G20A1 Parkinson's disease without dyskinesia, without mention of fluctuations: Secondary | ICD-10-CM | POA: Insufficient documentation

## 2014-05-01 ENCOUNTER — Ambulatory Visit (HOSPITAL_COMMUNITY)
Admission: RE | Admit: 2014-05-01 | Discharge: 2014-05-01 | Disposition: A | Discharge status: Home / Self Care | Payer: Medicare Other | Source: Ambulatory Visit | Attending: Internal Medicine | Admitting: Internal Medicine

## 2014-05-01 DIAGNOSIS — I714 Abdominal aortic aneurysm, without rupture, unspecified: Secondary | ICD-10-CM

## 2014-05-01 NOTE — Progress Notes (Signed)
Aortic Duplex Completed. Oda Cogan, BS, RDMS, RVT

## 2014-05-05 ENCOUNTER — Encounter: Payer: Self-pay | Admitting: *Deleted

## 2014-06-21 ENCOUNTER — Encounter: Payer: Self-pay | Admitting: Neurology

## 2014-06-27 ENCOUNTER — Encounter: Payer: Self-pay | Admitting: Neurology

## 2014-07-05 ENCOUNTER — Ambulatory Visit: Payer: Medicare Other | Admitting: Neurology

## 2014-07-10 ENCOUNTER — Ambulatory Visit: Payer: Medicare Other | Attending: Neurology | Admitting: Physical Therapy

## 2014-07-10 ENCOUNTER — Encounter: Payer: Self-pay | Admitting: Physical Therapy

## 2014-07-10 DIAGNOSIS — R269 Unspecified abnormalities of gait and mobility: Secondary | ICD-10-CM | POA: Diagnosis present
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
## 2014-07-17 ENCOUNTER — Ambulatory Visit: Payer: Medicare Other | Admitting: Physical Therapy

## 2014-07-17 ENCOUNTER — Encounter: Payer: Self-pay | Admitting: Physical Therapy

## 2014-07-17 DIAGNOSIS — R269 Unspecified abnormalities of gait and mobility: Secondary | ICD-10-CM
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
## 2014-07-19 ENCOUNTER — Encounter: Payer: Self-pay | Admitting: Physical Therapy

## 2014-07-19 ENCOUNTER — Ambulatory Visit: Payer: Medicare Other | Admitting: Physical Therapy

## 2014-07-19 DIAGNOSIS — R269 Unspecified abnormalities of gait and mobility: Secondary | ICD-10-CM

## 2014-07-19 NOTE — Patient Instructions (Signed)
Figure Eight   Walk in a figure eight pattern. Repeat __3__ times per session. Do _1-2___ sessions per Preast. Make sure to take large steps with arms swinging.  Copyright  VHI. All rights reserved.    Turning in Place: Solid Surface   Standing in place, lead with head and turn slowly making quarter turns toward left. Repeat ___3_ times per session. Do ___1-2_ sessions per Learn.  Repeat to the right   Provided patient with written PWR! Moves Flow exercises in standing:  10 reps, 1-2 times/Stauffer  Copyright  VHI. All rights reserved.
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
## 2014-07-24 ENCOUNTER — Ambulatory Visit: Payer: Medicare Other | Admitting: Physical Therapy

## 2014-07-24 ENCOUNTER — Encounter: Payer: Self-pay | Admitting: Physical Therapy

## 2014-07-24 DIAGNOSIS — R269 Unspecified abnormalities of gait and mobility: Secondary | ICD-10-CM
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
## 2014-07-26 ENCOUNTER — Ambulatory Visit: Payer: Medicare Other | Admitting: Physical Therapy

## 2014-08-01 ENCOUNTER — Ambulatory Visit: Payer: Medicare Other | Admitting: Physical Therapy

## 2014-08-01 ENCOUNTER — Encounter: Payer: Self-pay | Admitting: Physical Therapy

## 2014-08-01 DIAGNOSIS — R269 Unspecified abnormalities of gait and mobility: Secondary | ICD-10-CM | POA: Diagnosis not present
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
## 2014-08-02 ENCOUNTER — Ambulatory Visit: Payer: Medicare Other | Admitting: Physical Therapy

## 2014-08-02 ENCOUNTER — Encounter: Payer: Self-pay | Admitting: Physical Therapy

## 2014-08-02 DIAGNOSIS — R269 Unspecified abnormalities of gait and mobility: Secondary | ICD-10-CM

## 2014-08-02 NOTE — Patient Instructions (Signed)
Backward   Walk backwards with eyes open. Take even steps, making sure each foot lifts off floor. Repeat for __3__ minutes per session. Do __1-2__ sessions per Plucinski.  With backwards and forwards walking, make sure to swing your arms with this activity (oppposite arm and opposite leg)  Copyright  VHI. All rights reserved.  Side-Stepping   Walk to left side with eyes open. Take even steps, leading with same foot. Make sure each foot lifts off the floor. (Large, reaching steps) Repeat in opposite direction. Repeat for __3__ minutes per session. Do __1-2__ sessions per Lamping. Make sure to coordinate your arms-open your arms wide as you step wide.  Copyright  VHI. All rights reserved.  Walking/Marching   Perform without assistive device. Walk on solid surface with hand close to support. Attempt to keep hand away from support for longer periods of time, keeping a straight path. Repeat __3__ times per session the length of your hallway. Do _1-2___ sessions per [place].  --Marching forward with high leg lifts with coordinated (alternating) arm swing during marching  Copyright  VHI. All rights reserved.
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
## 2014-08-08 ENCOUNTER — Ambulatory Visit (INDEPENDENT_AMBULATORY_CARE_PROVIDER_SITE_OTHER): Payer: Medicare Other | Admitting: Neurology

## 2014-08-08 ENCOUNTER — Encounter: Payer: Self-pay | Admitting: Neurology

## 2014-08-08 VITALS — BP 109/69 | HR 62 | Ht 73.0 in | Wt 220.8 lb

## 2014-08-08 DIAGNOSIS — R269 Unspecified abnormalities of gait and mobility: Secondary | ICD-10-CM
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
## 2014-08-08 NOTE — Patient Instructions (Signed)

## 2014-08-09 ENCOUNTER — Ambulatory Visit: Payer: Medicare Other | Admitting: Physical Therapy

## 2014-08-10 ENCOUNTER — Encounter: Payer: Self-pay | Admitting: Physical Therapy

## 2014-08-10 ENCOUNTER — Ambulatory Visit: Payer: Medicare Other | Attending: Neurology | Admitting: Physical Therapy

## 2014-08-10 DIAGNOSIS — R269 Unspecified abnormalities of gait and mobility: Secondary | ICD-10-CM
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
## 2014-08-15 ENCOUNTER — Ambulatory Visit: Payer: Medicare Other | Admitting: Physical Therapy

## 2014-08-25 ENCOUNTER — Other Ambulatory Visit: Payer: Self-pay | Admitting: Neurology

## 2014-09-06 ENCOUNTER — Encounter: Payer: Self-pay | Admitting: Physical Therapy

## 2014-09-06 NOTE — Therapy (Signed)
[facility] [address] [place], [place], [address] Phone: [phone]   Fax:  [phone]  Patient Details  Name: Derek Harrell MRN: [id number] Date of Birth: [birth year] Referring Provider:  No ref. provider found  Encounter Date: 09/06/2014 PHYSICAL THERAPY DISCHARGE SUMMARY  Visits from Start of Care: 7  Current functional level related to goals / functional outcomes: PT LONG TERM GOAL #1     Title  Pt will be independent with HEP to improve balance, gait, posture.    Time  4    Period  Weeks    Status  Achieved    PT LONG TERM GOAL #2    Title  Pt will improve 5x sit<>stand score to less than or equal to 11.5 seconds for improved safety and efficiency with transfers.    Time  4    Period  Weeks    Status  Achieved    PT LONG TERM GOAL #3    Title  improve four-square step test to less than or equal to 15 seconds with no touching of obstacles, for improved change of directions with gait activities.    Time  4    Period  Weeks    Status  Achieved    PT LONG TERM GOAL #4    Title  verbalize/demonstrate understanding of fall prevention techniques within the home environment.    Time  4    Period  Weeks    Status  Achieved          Pt has met 4 of 4 long term goals.   Remaining deficits: Occasional freezing of gait, high level balance   Education / Equipment: Pt has been education in fall prevention, HEP, with pt verbalizing/demonstrating understanding.   Plan: Patient agrees to discharge.  Patient goals were met. Patient is being discharged due to meeting the stated rehab goals.  ?????      Derek Harrell W. 09/06/2014, 8:25 AM  Mady Haagensen, PT 09/06/2014 8:27 AM Phone: [phone] Fax: [facility] [address] [place], [place], [address] Phone: [phone]   Fax:  [phone]

## 2014-10-31 ENCOUNTER — Other Ambulatory Visit: Payer: Self-pay | Admitting: Neurology

## 2014-11-09 ENCOUNTER — Ambulatory Visit (INDEPENDENT_AMBULATORY_CARE_PROVIDER_SITE_OTHER): Payer: Medicare Other | Admitting: Family Medicine

## 2014-11-09 ENCOUNTER — Ambulatory Visit (INDEPENDENT_AMBULATORY_CARE_PROVIDER_SITE_OTHER): Payer: Medicare Other

## 2014-11-09 VITALS — BP 122/78 | HR 66 | Temp 98.2°F | Resp 16 | Ht 73.0 in | Wt 217.0 lb

## 2014-11-09 DIAGNOSIS — K5900 Constipation, unspecified: Secondary | ICD-10-CM | POA: Diagnosis not present

## 2014-11-09 DIAGNOSIS — R1032 Left lower quadrant pain: Secondary | ICD-10-CM | POA: Diagnosis not present
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
## 2014-11-09 NOTE — Patient Instructions (Signed)
Miralax one capful per [place] until bowels moving normally, then stop miralax and restart Linzess. If needed, you can try Fleet enema, but can start with Miralax alone for now.  Make sure you are drinking sufficient water and fiber in the diet.  Return to the clinic or go to the nearest emergency room if any of your symptoms worsen or new symptoms occur.      Constipation Constipation is when a person has fewer than three bowel movements a week, has difficulty having a bowel movement, or has stools that are dry, hard, or larger than normal. As people grow older, constipation is more common. If you try to fix constipation with medicines that make you have a bowel movement (laxatives), the problem may get worse. Long-term laxative use may cause the muscles of the colon to become weak. A low-fiber diet, not taking in enough fluids, and taking certain medicines may make constipation worse.  CAUSES   Certain medicines, such as antidepressants, pain medicine, iron supplements, antacids, and water pills.   Certain diseases, such as diabetes, irritable bowel syndrome (IBS), thyroid disease, or depression.   Not drinking enough water.   Not eating enough fiber-rich foods.   Stress or travel.   Lack of physical activity or exercise.   Ignoring the urge to have a bowel movement.   Using laxatives too much.  SIGNS AND SYMPTOMS   Having fewer than three bowel movements a week.   Straining to have a bowel movement.   Having stools that are hard, dry, or larger than normal.   Feeling full or bloated.   Pain in the lower abdomen.   Not feeling relief after having a bowel movement.  DIAGNOSIS  Your health care provider will take a medical history and perform a physical exam. Further testing may be done for severe constipation. Some tests may include:  A barium enema X-ray to examine your rectum, colon, and, sometimes, your small intestine.   A sigmoidoscopy to examine your  lower colon.   A colonoscopy to examine your entire colon. TREATMENT  Treatment will depend on the severity of your constipation and what is causing it. Some dietary treatments include drinking more fluids and eating more fiber-rich foods. Lifestyle treatments may include regular exercise. If these diet and lifestyle recommendations do not help, your health care provider may recommend taking over-the-counter laxative medicines to help you have bowel movements. Prescription medicines may be prescribed if over-the-counter medicines do not work.  HOME CARE INSTRUCTIONS   Eat foods that have a lot of fiber, such as fruits, vegetables, whole grains, and beans.  Limit foods high in fat and processed sugars, such as french fries, hamburgers, cookies, candies, and soda.   A fiber supplement may be added to your diet if you cannot get enough fiber from foods.   Drink enough fluids to keep your urine clear or pale yellow.   Exercise regularly or as directed by your health care provider.   Go to the restroom when you have the urge to go. Do not hold it.   Only take over-the-counter or prescription medicines as directed by your health care provider. Do not take other medicines for constipation without talking to your health care provider first.  [place] IF:   You have bright red blood in your stool.   Your constipation lasts for more than 4 days or gets worse.   You have abdominal or rectal pain.   You have thin, pencil-like stools.  You have unexplained weight loss. MAKE SURE YOU:   Understand these instructions.  Will watch your condition.  Will get help right away if you are not doing well or get worse. Document Released: 04/18/2004 Document Revised: 07/26/2013 Document Reviewed: 05/02/2013 [facility] Patient Information 2015 [place], [place]. This information is not intended to replace advice given to you by your health care provider. Make sure you discuss  any questions you have with your health care provider.

## 2014-12-04 ENCOUNTER — Telehealth: Payer: Self-pay | Admitting: Cardiovascular Disease

## 2014-12-05 NOTE — Telephone Encounter (Signed)
Close encounter 

## 2015-01-13 ENCOUNTER — Emergency Department (HOSPITAL_COMMUNITY)
Admission: EM | Admit: 2015-01-13 | Discharge: 2015-01-13 | Disposition: A | Discharge status: Home / Self Care | Payer: Medicare Other | Attending: Emergency Medicine | Admitting: Emergency Medicine

## 2015-01-13 ENCOUNTER — Encounter (HOSPITAL_COMMUNITY): Payer: Self-pay | Admitting: Emergency Medicine

## 2015-01-13 ENCOUNTER — Emergency Department (HOSPITAL_COMMUNITY): Payer: Medicare Other

## 2015-01-13 DIAGNOSIS — Z8719 Personal history of other diseases of the digestive system: Secondary | ICD-10-CM | POA: Diagnosis not present

## 2015-01-13 DIAGNOSIS — Z7982 Long term (current) use of aspirin: Secondary | ICD-10-CM | POA: Insufficient documentation

## 2015-01-13 DIAGNOSIS — Z8673 Personal history of transient ischemic attack (TIA), and cerebral infarction without residual deficits: Secondary | ICD-10-CM | POA: Diagnosis not present

## 2015-01-13 DIAGNOSIS — Z79899 Other long term (current) drug therapy: Secondary | ICD-10-CM | POA: Insufficient documentation

## 2015-01-13 DIAGNOSIS — I10 Essential (primary) hypertension: Secondary | ICD-10-CM | POA: Diagnosis not present

## 2015-01-13 DIAGNOSIS — R5383 Other fatigue: Secondary | ICD-10-CM | POA: Insufficient documentation

## 2015-01-13 DIAGNOSIS — E785 Hyperlipidemia, unspecified: Secondary | ICD-10-CM | POA: Diagnosis not present

## 2015-01-13 DIAGNOSIS — Z8739 Personal history of other diseases of the musculoskeletal system and connective tissue: Secondary | ICD-10-CM | POA: Insufficient documentation

## 2015-01-13 DIAGNOSIS — Z87891 Personal history of nicotine dependence: Secondary | ICD-10-CM | POA: Diagnosis not present

## 2015-01-13 DIAGNOSIS — Z7902 Long term (current) use of antithrombotics/antiplatelets: Secondary | ICD-10-CM | POA: Diagnosis not present

## 2015-01-13 DIAGNOSIS — E669 Obesity, unspecified: Secondary | ICD-10-CM | POA: Insufficient documentation

## 2015-01-13 DIAGNOSIS — H538 Other visual disturbances: Secondary | ICD-10-CM | POA: Diagnosis present

## 2015-01-13 LAB — CBC WITH DIFFERENTIAL/PLATELET
Basophils Absolute: 0 10*3/uL (ref 0.0–0.1)
Basophils Relative: 1 % (ref 0–1)
EOS PCT: 4 % (ref 0–5)
Eosinophils Absolute: 0.3 10*3/uL (ref 0.0–0.7)
HEMATOCRIT: 45.3 % (ref 39.0–52.0)
Hemoglobin: 15.5 g/dL (ref 13.0–17.0)
Lymphocytes Relative: 28 % (ref 12–46)
Lymphs Abs: 2 10*3/uL (ref 0.7–4.0)
MCH: 26.7 pg (ref 26.0–34.0)
MCHC: 34.2 g/dL (ref 30.0–36.0)
MCV: 78.1 fL (ref 78.0–100.0)
MONOS PCT: 8 % (ref 3–12)
Monocytes Absolute: 0.6 10*3/uL (ref 0.1–1.0)
Neutro Abs: 4.1 10*3/uL (ref 1.7–7.7)
Neutrophils Relative %: 59 % (ref 43–77)
Platelets: 156 10*3/uL (ref 150–400)
RBC: 5.8 MIL/uL (ref 4.22–5.81)
RDW: 13.1 % (ref 11.5–15.5)
WBC: 7.1 10*3/uL (ref 4.0–10.5)

## 2015-01-13 LAB — I-STAT CHEM 8, ED
BUN: 12 mg/dL (ref 6–20)
Calcium, Ion: 1.15 mmol/L (ref 1.13–1.30)
Chloride: 104 mmol/L (ref 101–111)
Creatinine, Ser: 0.9 mg/dL (ref 0.61–1.24)
Glucose, Bld: 104 mg/dL — ABNORMAL HIGH (ref 65–99)
HEMATOCRIT: 48 % (ref 39.0–52.0)
Hemoglobin: 16.3 g/dL (ref 13.0–17.0)
POTASSIUM: 3.7 mmol/L (ref 3.5–5.1)
Sodium: 142 mmol/L (ref 135–145)
TCO2: 21 mmol/L (ref 0–100)

## 2015-01-13 NOTE — Discharge Instructions (Signed)
Visual Disturbances You have had a disturbance in your vision. This may be caused by various conditions, such as:  Migraines. Migraine headaches are often preceded by a disturbance in vision. Blind spots or light flashes are followed by a headache. This type of visual disturbance is temporary. It does not damage the eye.  Glaucoma. This is caused by increased pressure in the eye. Symptoms include haziness, blurred vision, or seeing rainbow colored circles when looking at bright lights. Partial or complete visual loss can occur. You may or may not experience eye pain. Visual loss may be gradual or sudden and is irreversible. Glaucoma is the leading cause of blindness.  Retina problems. Vision will be reduced if the retina becomes detached or if there is a circulation problem as with diabetes, high blood pressure, or a mini-stroke. Symptoms include seeing "floaters," flashes of light, or shadows, as if a curtain has fallen over your eye.  Optic nerve problems. The main nerve in your eye can be damaged by redness, soreness, and swelling (inflammation), poor circulation, drugs, and toxins. It is very important to have a complete exam done by a specialist to determine the exact cause of your eye problem. The specialist may recommend medicines or surgery, depending on the cause of the problem. This can help prevent further loss of vision or reduce the risk of having a stroke. Contact the caregiver to whom you have been referred and arrange for follow-up care right away. SEEK IMMEDIATE MEDICAL CARE IF:   Your vision gets worse.  You develop severe headaches.  You have any weakness or numbness in the face, arms, or legs.  You have any trouble speaking or walking. Document Released: 08/28/2004 Document Revised: 10/13/2011 Document Reviewed: 12/19/2009 [facility] Patient Information 2015 [place], [place]. This information is not intended to replace advice given to you by your health care provider. Make sure  you discuss any questions you have with your health care provider. Today you were evaluated for visual disturbance or head CT is normal.  Labs electrolytes, EKG are all normal.  If you develop new or worsening symptoms associated with fever, headache, nausea,vomiting, and return immediately to the emergency department for further evaluation
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
## 2015-01-13 NOTE — ED Notes (Signed)
Patient transported to CT 

## 2015-01-13 NOTE — ED Notes (Signed)
Pt. Left with all belongings 

## 2015-01-23 ENCOUNTER — Encounter: Payer: Self-pay | Admitting: Neurology

## 2015-01-23 ENCOUNTER — Ambulatory Visit (INDEPENDENT_AMBULATORY_CARE_PROVIDER_SITE_OTHER): Payer: Medicare Other | Admitting: Neurology

## 2015-01-23 VITALS — BP 124/78 | HR 57 | Ht 73.0 in | Wt 212.0 lb

## 2015-01-23 DIAGNOSIS — R269 Unspecified abnormalities of gait and mobility: Secondary | ICD-10-CM

## 2015-01-23 DIAGNOSIS — H5310 Unspecified subjective visual disturbances: Secondary | ICD-10-CM
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
## 2015-01-23 NOTE — Patient Instructions (Signed)
Visual Disturbances °You have had a disturbance in your vision. This may be caused by various conditions, such as: °· Migraines. Migraine headaches are often preceded by a disturbance in vision. Blind spots or light flashes are followed by a headache. This type of visual disturbance is temporary. It does not damage the eye. °· Glaucoma. This is caused by increased pressure in the eye. Symptoms include haziness, blurred vision, or seeing rainbow colored circles when looking at bright lights. Partial or complete visual loss can occur. You may or may not experience eye pain. Visual loss may be gradual or sudden and is irreversible. Glaucoma is the leading cause of blindness. °· Retina problems. Vision will be reduced if the retina becomes detached or if there is a circulation problem as with diabetes, high blood pressure, or a mini-stroke. Symptoms include seeing "floaters," flashes of light, or shadows, as if a curtain has fallen over your eye. °· Optic nerve problems. The main nerve in your eye can be damaged by redness, soreness, and swelling (inflammation), poor circulation, drugs, and toxins. °It is very important to have a complete exam done by a specialist to determine the exact cause of your eye problem. The specialist may recommend medicines or surgery, depending on the cause of the problem. This can help prevent further loss of vision or reduce the risk of having a stroke. Contact the caregiver to whom you have been referred and arrange for follow-up care right away. °SEEK IMMEDIATE MEDICAL CARE IF:  °· Your vision gets worse. °· You develop severe headaches. °· You have any weakness or numbness in the face, arms, or legs. °· You have any trouble speaking or walking. °Document Released: 08/28/2004 Document Revised: 10/13/2011 Document Reviewed: 12/19/2009 °ExitCare® Patient Information ©2015 [employer]. This information is not intended to replace advice given to you by your health care provider. Make sure  you discuss any questions you have with your health care provider. ° °

## 2015-01-24 ENCOUNTER — Telehealth: Payer: Self-pay

## 2015-01-24 NOTE — Telephone Encounter (Signed)
Called patient to reschedule Carotid US appointment, LVM for him to call office back. I would like to schedule him for June 29th @ 3p.

## 2015-01-24 NOTE — Telephone Encounter (Signed)
Called Patient, scheduled US Carotid.  Patient verbalized understanding. 

## 2015-01-24 NOTE — Telephone Encounter (Signed)
Patient returned call. Please call and advise.  °

## 2015-01-30 ENCOUNTER — Other Ambulatory Visit: Payer: Medicare Other

## 2015-01-30 ENCOUNTER — Ambulatory Visit
Admission: RE | Admit: 2015-01-30 | Discharge: 2015-01-30 | Disposition: A | Discharge status: Home / Self Care | Payer: Medicare Other | Source: Ambulatory Visit | Attending: Neurology | Admitting: Neurology

## 2015-01-30 DIAGNOSIS — H5310 Unspecified subjective visual disturbances: Secondary | ICD-10-CM | POA: Diagnosis not present

## 2015-01-31 ENCOUNTER — Ambulatory Visit (INDEPENDENT_AMBULATORY_CARE_PROVIDER_SITE_OTHER): Payer: Medicare Other

## 2015-01-31 DIAGNOSIS — H5310 Unspecified subjective visual disturbances: Secondary | ICD-10-CM | POA: Diagnosis not present

## 2015-02-01 ENCOUNTER — Telehealth: Payer: Self-pay | Admitting: Neurology

## 2015-02-01 NOTE — Telephone Encounter (Signed)
  I called the patient. The MRI the brain is unremarkable, no change from January of 2015. We will check carotid Doppler studies, patient will remain on aspirin.  MRI brain 01/30/2015:  IMPRESSION:  Abnormal MRI brain (without) demonstrating: 1. Mild-moderate periventricular and subcortical foci of non-specific T2 hyperintensities, likely chronic small vessel ischemic disease, with associated remote pontine infarctions. 2. No acute findings. 3. No change from MRI on 08/22/13.

## 2015-02-14 ENCOUNTER — Telehealth: Payer: Self-pay | Admitting: Neurology

## 2015-02-14 NOTE — Telephone Encounter (Signed)
I called the patient. The carotid Doppler study appears to be unremarkable.

## 2015-02-19 ENCOUNTER — Ambulatory Visit: Payer: Medicare Other | Admitting: Neurology

## 2015-02-21 ENCOUNTER — Ambulatory Visit (INDEPENDENT_AMBULATORY_CARE_PROVIDER_SITE_OTHER): Payer: Medicare Other | Admitting: Cardiovascular Disease

## 2015-02-21 ENCOUNTER — Encounter: Payer: Self-pay | Admitting: Cardiovascular Disease

## 2015-02-21 VITALS — BP 140/86 | HR 59 | Ht 73.0 in | Wt 212.0 lb

## 2015-02-21 DIAGNOSIS — I714 Abdominal aortic aneurysm, without rupture, unspecified: Secondary | ICD-10-CM

## 2015-02-21 DIAGNOSIS — E785 Hyperlipidemia, unspecified: Secondary | ICD-10-CM | POA: Diagnosis not present

## 2015-02-21 DIAGNOSIS — I10 Essential (primary) hypertension: Secondary | ICD-10-CM

## 2015-02-21 NOTE — Assessment & Plan Note (Signed)
History of hyperlipidemia on Crestor 10 mg Pense followed by his PCP

## 2015-02-21 NOTE — Assessment & Plan Note (Signed)
History of hypertension with blood pressure medicines at 140/86. He is on amlodipine and Bystolic . Continue current meds at current dosing

## 2015-02-21 NOTE — Patient Instructions (Signed)
Dr Gwenlyn Found will follow-up with you as needed.  You should receive a phone call in September to schedule your abdominal ultrasound.

## 2015-02-21 NOTE — Progress Notes (Signed)
02/21/2015 Derek Harrell   [birth year]  [id number]  Primary Physician Derek Forward, MD Primary Cardiologist: Derek Harp MD Derek Harrell   HPI:  The patient is a delightful 69 year old moderately overweight divorced African American male, father of 62, grandfather to 5 grandchildren, referred through the courtesy of Derek Harrell at [facility] for evaluation of an incidentally-noted small abdominal aortic aneurysm on CT scanning.   The patient lives alone. He is retired from [employer], where he worked for 39 years. His cardiac risk factor profile is positive for hypertension and hyperlipidemia. He quit smoking 15 years ago and smoked 1/2 to 1 pack a [place] for 25 years prior to that. He does have a family history of heart disease with a father who died of an MI at age 28. He has never had a heart attack, but has had a stroke 4 to 5 years ago without residual neurologic disability. He denies chest pain or shortness of breath. A CT scan was done for evaluation of abdominal pain, which was unremarkable except for the above-noted small abdominal aortic aneurysm.  Since I saw him 12 months ago he denies chest pain and shortness of breath. Recent abdominal ultrasound performed 10 months ago revealed his aneurysm to have remained stable at 3.8 cm.   Current Outpatient Prescriptions  Medication Sig Dispense Refill  . amLODipine (NORVASC) 10 MG tablet Take 10 mg by mouth daily.    Marland Kitchen aspirin 81 MG EC tablet Take 1 tablet (81 mg total) by mouth daily. Swallow whole. 30 tablet 12  . carbidopa-levodopa (SINEMET CR) 50-200 MG per tablet TAKE 1 TABLET AT 8AM, NOON, 4PM, AND 8PM TO TOTAL FOUR TIMES DAILY 120 tablet 6  . clopidogrel (PLAVIX) 75 MG tablet Take 75 mg by mouth daily.    Marland Kitchen LINZESS 290 MCG CAPS capsule TAKE ONE CAPSULE BY MOUTH EVERY [place] (Patient taking differently: TAKE ONE CAPSULE BY MOUTH AS NEEDED FOR CONSTIPATION) 30 capsule 3  . nebivolol (BYSTOLIC) 10 MG tablet Take  10 mg by mouth daily.    . rosuvastatin (CRESTOR) 10 MG tablet Take 10 mg by mouth daily.     No current facility-administered medications for this visit.    No Known Allergies  History   Social History  . Marital Status: Divorced    Spouse Name: N/A  . Number of Children: 3  . Years of Education: college   Occupational History  .  [employer]   Social History Main Topics  . Smoking status: Former Smoker -- 0.06 packs/[place] for 26 years    Types: Cigarettes    Quit date: 03/23/1993  . Smokeless tobacco: Never Used  . Alcohol Use: No     Comment: 08/22/2013 "mixed drink or glass of wine or beer once/wk"  . Drug Use: No  . Sexual Activity: Yes   Other Topics Concern  . Not on file   Social History Narrative   Patient is single, has 3 children   Patient is right handed   Education level is some college   Caffeine consumption is 1 cup daily     Review of Systems: General: negative for chills, fever, night sweats or weight changes.  Cardiovascular: negative for chest pain, dyspnea on exertion, edema, orthopnea, palpitations, paroxysmal nocturnal dyspnea or shortness of breath Dermatological: negative for rash Respiratory: negative for cough or wheezing Urologic: negative for hematuria Abdominal: negative for nausea, vomiting, diarrhea, bright red blood per rectum, melena, or hematemesis Neurologic: negative for visual changes,  syncope, or dizziness All other systems reviewed and are otherwise negative except as noted above.    Blood pressure 140/86, pulse 59, height 6\' 1"  (1.854 m), weight 212 lb (96.163 kg).  General appearance: alert and no distress Neck: no adenopathy, no carotid bruit, no JVD, supple, symmetrical, trachea midline and thyroid not enlarged, symmetric, no tenderness/mass/nodules Lungs: clear to auscultation bilaterally Heart: regular rate and rhythm, S1, S2 normal, no murmur, click, rub or gallop Extremities: extremities normal, atraumatic, no  cyanosis or edema  EKG not performed today  ASSESSMENT AND PLAN:   Hyperlipidemia History of hyperlipidemia on Crestor 10 mg [place] followed by his PCP  Essential hypertension History of hypertension with blood pressure medicines at 140/86. He is on amlodipine and Bystolic . Continue current meds at current dosing  Abdominal aortic aneurysm.  3.7 x 3.6 cm infrarenal abdominal aortic aneurysm. History of a small abdominal aortic aneurysm last assessed by duplex ultrasound 04/2514 revealing a largest dimension of 3.8 cm. We will continue to follow this on an annual basis.      Derek Harp MD FACP,FACC,FAHA, [facility] 02/21/2015 11:03 AM

## 2015-02-21 NOTE — Assessment & Plan Note (Signed)
History of a small abdominal aortic aneurysm last assessed by duplex ultrasound 04/2514 revealing a largest dimension of 3.8 cm. We will continue to follow this on an annual basis.

## 2015-04-02 ENCOUNTER — Ambulatory Visit (INDEPENDENT_AMBULATORY_CARE_PROVIDER_SITE_OTHER): Payer: Medicare Other | Admitting: Emergency Medicine

## 2015-04-02 VITALS — BP 126/80 | HR 78 | Temp 98.8°F | Resp 17 | Ht 71.0 in | Wt 204.0 lb

## 2015-04-02 DIAGNOSIS — K5900 Constipation, unspecified: Secondary | ICD-10-CM

## 2015-04-02 MED ORDER — FLEET ENEMA 7-19 GM/118ML RE ENEM: 1.0000 | ENEMA | Freq: Once | RECTAL | Status: DC

## 2015-04-02 MED ORDER — MAGNESIUM CITRATE PO SOLN: 1.0000 | Freq: Once | ORAL | Status: DC
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
## 2015-04-02 NOTE — Patient Instructions (Signed)

## 2015-04-11 ENCOUNTER — Other Ambulatory Visit: Payer: Self-pay | Admitting: Neurology

## 2015-04-30 ENCOUNTER — Other Ambulatory Visit (INDEPENDENT_AMBULATORY_CARE_PROVIDER_SITE_OTHER): Payer: Medicare Other

## 2015-04-30 ENCOUNTER — Ambulatory Visit (INDEPENDENT_AMBULATORY_CARE_PROVIDER_SITE_OTHER): Payer: Medicare Other | Admitting: Internal Medicine

## 2015-04-30 ENCOUNTER — Encounter: Payer: Self-pay | Admitting: Internal Medicine

## 2015-04-30 VITALS — BP 118/74 | HR 61 | Temp 98.3°F | Resp 16 | Ht 73.0 in | Wt 204.8 lb

## 2015-04-30 DIAGNOSIS — E785 Hyperlipidemia, unspecified: Secondary | ICD-10-CM | POA: Diagnosis not present

## 2015-04-30 DIAGNOSIS — Z8673 Personal history of transient ischemic attack (TIA), and cerebral infarction without residual deficits: Secondary | ICD-10-CM

## 2015-04-30 DIAGNOSIS — I10 Essential (primary) hypertension: Secondary | ICD-10-CM | POA: Diagnosis not present

## 2015-04-30 DIAGNOSIS — I69898 Other sequelae of other cerebrovascular disease: Secondary | ICD-10-CM

## 2015-04-30 DIAGNOSIS — IMO0002 Reserved for concepts with insufficient information to code with codable children: Secondary | ICD-10-CM

## 2015-04-30 DIAGNOSIS — R531 Weakness: Secondary | ICD-10-CM

## 2015-04-30 DIAGNOSIS — G4700 Insomnia, unspecified: Secondary | ICD-10-CM

## 2015-04-30 LAB — LIPID PANEL
Cholesterol: 138 mg/dL (ref 0–200)
HDL: 48.5 mg/dL (ref >39.00)
LDL Cholesterol: 75 mg/dL (ref 0–99)
NonHDL: 89.17
TRIGLYCERIDES: 70 mg/dL (ref 0.0–149.0)
Total CHOL/HDL Ratio: 3
VLDL: 14 mg/dL (ref 0.0–40.0)

## 2015-04-30 LAB — HEMOGLOBIN A1C: Hgb A1c MFr Bld: 5.4 % (ref 4.6–6.5)

## 2015-04-30 MED ORDER — TRAZODONE HCL 50 MG PO TABS: 25.0000 mg | ORAL_TABLET | Freq: Every evening | ORAL | Status: DC | PRN

## 2015-04-30 NOTE — Progress Notes (Signed)
   Subjective:    Patient ID: Derek Harrell, male    DOB: 11-25-1945, 69 y.o.   MRN: [id number]  HPI The patient is a new 69 YO man coming in for problems with sleeping. He is not always able to get to sleep and sometimes he is waking up in the middle of the night. Denies distractions or eating right before bed. No cellular phone usage in bed. Not napping during the nap usually. Has tried melatonin over the counter which did not work.   PMH, [facility], social history reviewed and updated.   Review of Systems  Constitutional: Negative for fever, activity change, appetite change, fatigue and unexpected weight change.  HENT: Negative.   Eyes: Negative.   Respiratory: Negative for cough, chest tightness, shortness of breath and wheezing.   Cardiovascular: Negative for chest pain, palpitations and leg swelling.  Gastrointestinal: Negative for abdominal pain, diarrhea, constipation and abdominal distention.  Musculoskeletal: Positive for arthralgias. Negative for back pain and gait problem.  Skin: Negative.   Neurological: Negative.   Psychiatric/Behavioral: Positive for sleep disturbance. Negative for suicidal ideas, confusion, self-injury, dysphoric mood and decreased concentration. The patient is not nervous/anxious.       Objective:   Physical Exam  Constitutional: He is oriented to person, place, and time. He appears well-developed and well-nourished.  HENT:  Head: Normocephalic and atraumatic.  Eyes: EOM are normal.  Neck: Normal range of motion.  Cardiovascular: Normal rate and regular rhythm.   Pulmonary/Chest: Effort normal and breath sounds normal. No respiratory distress. He has no wheezes. He has no rales.  Abdominal: Soft. Bowel sounds are normal. He exhibits no distension. There is no tenderness. There is no rebound.  Musculoskeletal: He exhibits no edema.  Neurological: He is alert and oriented to person, place, and time. Coordination normal.  Skin: Skin is warm and dry.  Psychiatric:  He has a normal mood and affect.    Filed Vitals:   04/30/15 1316  BP: 118/74  Pulse: 61  Temp: 98.3 F (36.8 C)  TempSrc: Oral  Resp: 16  Height: 6\' 1"  (1.854 m)  Weight: 204 lb 12.8 oz (92.897 kg)  SpO2: 97%      Assessment & Plan:

## 2015-04-30 NOTE — Patient Instructions (Addendum)
We are going to check some labs today and call you back with the results.   We will send in a medicine for sleep called trazodone that you can take about 30 minutes before bedtime. It can take 1 week to work so please give it that time to get into your system.   Insomnia Insomnia is frequent trouble falling and/or staying asleep. Insomnia can be a long term problem or a short term problem. Both are common. Insomnia can be a short term problem when the wakefulness is related to a certain stress or worry. Long term insomnia is often related to ongoing stress during waking hours and/or poor sleeping habits. Overtime, sleep deprivation itself can make the problem worse. Every little thing feels more severe because you are overtired and your ability to cope is decreased. CAUSES   Stress, anxiety, and depression.  Poor sleeping habits.  Distractions such as TV in the bedroom.  Naps close to bedtime.  Engaging in emotionally charged conversations before bed.  Technical reading before sleep.  Alcohol and other sedatives. They may make the problem worse. They can hurt normal sleep patterns and normal dream activity.  Stimulants such as caffeine for several hours prior to bedtime.  Pain syndromes and shortness of breath can cause insomnia.  Exercise late at night.  Changing time zones may cause sleeping problems (jet lag). It is sometimes helpful to have someone observe your sleeping patterns. They should look for periods of not breathing during the night (sleep apnea). They should also look to see how long those periods last. If you live alone or observers are uncertain, you can also be observed at a sleep clinic where your sleep patterns will be professionally monitored. Sleep apnea requires a checkup and treatment. Give your caregivers your medical history. Give your caregivers observations your family has made about your sleep.  SYMPTOMS   Not feeling rested in the morning.  Anxiety and  restlessness at bedtime.  Difficulty falling and staying asleep. TREATMENT   Your caregiver may prescribe treatment for an underlying medical disorders. Your caregiver can give advice or help if you are using alcohol or other drugs for self-medication. Treatment of underlying problems will usually eliminate insomnia problems.  Medications can be prescribed for short time use. They are generally not recommended for lengthy use.  Over-the-counter sleep medicines are not recommended for lengthy use. They can be habit forming.  You can promote easier sleeping by making lifestyle changes such as:  Using relaxation techniques that help with breathing and reduce muscle tension.  Exercising earlier in the [place].  Changing your diet and the time of your last meal. No night time snacks.  Establish a regular time to go to bed.  Counseling can help with stressful problems and worry.  Soothing music and white noise may be helpful if there are background noises you cannot remove.  Stop tedious detailed work at least one hour before bedtime. HOME CARE INSTRUCTIONS   Keep a diary. Inform your caregiver about your progress. This includes any medication side effects. See your caregiver regularly. Take note of:  Times when you are asleep.  Times when you are awake during the night.  The quality of your sleep.  How you feel the next [place]. This information will help your caregiver care for you.  Get out of bed if you are still awake after 15 minutes. Read or do some quiet activity. Keep the lights down. Wait until you feel sleepy and go back to bed.  Keep regular sleeping and waking hours. Avoid naps.  Exercise regularly.  Avoid distractions at bedtime. Distractions include watching television or engaging in any intense or detailed activity like attempting to balance the household checkbook.  Develop a bedtime ritual. Keep a familiar routine of bathing, brushing your teeth, climbing into bed  at the same time each night, listening to soothing music. Routines increase the success of falling to sleep faster.  Use relaxation techniques. This can be using breathing and muscle tension release routines. It can also include visualizing peaceful scenes. You can also help control troubling or intruding thoughts by keeping your mind occupied with boring or repetitive thoughts like the old concept of counting sheep. You can make it more creative like imagining planting one beautiful flower after another in your backyard garden.  During your [place], work to eliminate stress. When this is not possible use some of the previous suggestions to help reduce the anxiety that accompanies stressful situations. MAKE SURE YOU:   Understand these instructions.  Will watch your condition.  Will get help right away if you are not doing well or get worse. Document Released: 07/18/2000 Document Revised: 10/13/2011 Document Reviewed: 08/18/2007 [facility] Patient Information 2015 [place], [place]. This information is not intended to replace advice given to you by your health care provider. Make sure you discuss any questions you have with your health care provider.

## 2015-04-30 NOTE — Progress Notes (Signed)
Pre visit review using our clinic review tool, if applicable. No additional management support is needed unless otherwise documented below in the visit note. 

## 2015-05-02 DIAGNOSIS — G4700 Insomnia, unspecified: Secondary | ICD-10-CM | POA: Insufficient documentation

## 2015-05-02 NOTE — Assessment & Plan Note (Signed)
Will try trazodone for the sleeping to see if this is helpful. Talked to him about good sleep hygiene and he will work on that as well.

## 2015-05-02 NOTE — Assessment & Plan Note (Signed)
Checking HgA1c and lipid panel to make sure his risk factors are controlled.

## 2015-05-02 NOTE — Assessment & Plan Note (Signed)
BP at goal with amlodipine and bystolic. Continue those and recent labs reviewed and no indication for change.

## 2015-05-02 NOTE — Assessment & Plan Note (Addendum)
No recent lipid panel recently so will check today. Goal LDL <100 due to hx stroke. Taking crestor 10 mg daily without side effects.

## 2015-05-31 ENCOUNTER — Encounter: Payer: Self-pay | Admitting: Internal Medicine

## 2015-05-31 ENCOUNTER — Ambulatory Visit (INDEPENDENT_AMBULATORY_CARE_PROVIDER_SITE_OTHER): Payer: Medicare Other | Admitting: Internal Medicine

## 2015-05-31 ENCOUNTER — Other Ambulatory Visit (INDEPENDENT_AMBULATORY_CARE_PROVIDER_SITE_OTHER): Payer: Medicare Other

## 2015-05-31 VITALS — BP 122/86 | HR 61 | Temp 97.9°F | Resp 12 | Ht 73.0 in | Wt 205.0 lb

## 2015-05-31 DIAGNOSIS — G4700 Insomnia, unspecified: Secondary | ICD-10-CM

## 2015-05-31 DIAGNOSIS — I10 Essential (primary) hypertension: Secondary | ICD-10-CM

## 2015-05-31 DIAGNOSIS — R413 Other amnesia: Secondary | ICD-10-CM

## 2015-05-31 DIAGNOSIS — Z0000 Encounter for general adult medical examination without abnormal findings: Secondary | ICD-10-CM | POA: Diagnosis not present

## 2015-05-31 DIAGNOSIS — Z23 Encounter for immunization: Secondary | ICD-10-CM

## 2015-05-31 LAB — COMPREHENSIVE METABOLIC PANEL
ALBUMIN: 4.1 g/dL (ref 3.5–5.2)
ALT: 3 U/L (ref 0–53)
AST: 14 U/L (ref 0–37)
Alkaline Phosphatase: 57 U/L (ref 39–117)
BUN: 11 mg/dL (ref 6–23)
CO2: 30 mEq/L (ref 19–32)
CREATININE: 0.81 mg/dL (ref 0.40–1.50)
Calcium: 9.6 mg/dL (ref 8.4–10.5)
Chloride: 106 mEq/L (ref 96–112)
GFR: 121.97 mL/min (ref >60.00)
GLUCOSE: 109 mg/dL — AB (ref 70–99)
Potassium: 4.5 mEq/L (ref 3.5–5.1)
SODIUM: 143 meq/L (ref 135–145)
TOTAL PROTEIN: 7.3 g/dL (ref 6.0–8.3)
Total Bilirubin: 0.9 mg/dL (ref 0.2–1.2)

## 2015-05-31 LAB — VITAMIN B12: Vitamin B-12: 315 pg/mL (ref 211–911)

## 2015-05-31 LAB — T4, FREE: Free T4: 0.84 ng/dL (ref 0.60–1.60)

## 2015-05-31 LAB — FOLATE: FOLATE: 11.3 ng/mL (ref >5.9)

## 2015-05-31 LAB — TSH: TSH: 0.61 u[IU]/mL (ref 0.35–4.50)

## 2015-05-31 MED ORDER — POLYETHYLENE GLYCOL 3350 17 GM/SCOOP PO POWD: 17.0000 g | Freq: Every day | ORAL | Status: DC

## 2015-05-31 NOTE — Patient Instructions (Addendum)
We will check some labs today and will call you back with the results.   We will also work with the neurologist to make sure that we can change things if needed to help with the problems we talked about today.  We have sent in some miralax which is a powder that you mix with water and can take daily to help the bowels move better. Use 1 scoop daily with water. If this does not help you can try using it twice a Considine (in the morning and in the evening).   Memory Compensation Strategies  1. Use "WARM" strategy.  W= write it down  A= associate it  R= repeat it  M= make a mental note  2.   You can keep a Social worker.  Use a 3-ring notebook with sections for the following: calendar, important names and phone numbers,  medications, doctors' names/phone numbers, lists/reminders, and a section to journal what you did  each [place].   3.    Use a calendar to write appointments down.  4.    Write yourself a schedule for the Guillen.  This can be placed on the calendar or in a separate section of the Memory Notebook.  Keeping a  regular schedule can help memory.  5.    Use medication organizer with sections for each [place] or morning/evening pills.  You may need help loading it  6.    Keep a basket, or pegboard by the door.  Place items that you need to take out with you in the basket or on the pegboard.  You may also want to  include a message board for reminders.  7.    Use sticky notes.  Place sticky notes with reminders in a place where the task is performed.  For example: " turn off the  stove" placed by the stove, "lock the door" placed on the door at eye level, " take your medications" on  the bathroom mirror or by the place where you normally take your medications.  8.    Use alarms/timers.  Use while cooking to remind yourself to check on food or as a reminder to take your medicine, or as a  reminder to make a call, or as a reminder to perform another task, etc.  Health Maintenance, Male A  healthy lifestyle and preventative care can promote health and wellness.  Maintain regular health, dental, and eye exams.  Eat a healthy diet. Foods like vegetables, fruits, whole grains, low-fat dairy products, and lean protein foods contain the nutrients you need and are low in calories. Decrease your intake of foods high in solid fats, added sugars, and salt. Get information about a proper diet from your health care provider, if necessary.  Regular physical exercise is one of the most important things you can do for your health. Most adults should get at least 150 minutes of moderate-intensity exercise (any activity that increases your heart rate and causes you to sweat) each week. In addition, most adults need muscle-strengthening exercises on 2 or more days a week.   Maintain a healthy weight. The body mass index (BMI) is a screening tool to identify possible weight problems. It provides an estimate of body fat based on height and weight. Your health care provider can find your BMI and can help you achieve or maintain a healthy weight. For males 20 years and older:  A BMI below 18.5 is considered underweight.  A BMI of 18.5 to 24.9 is normal.  A BMI of 25 to 29.9 is considered overweight.  A BMI of 30 and above is considered obese.  Maintain normal blood lipids and cholesterol by exercising and minimizing your intake of saturated fat. Eat a balanced diet with plenty of fruits and vegetables. Blood tests for lipids and cholesterol should begin at age 23 and be repeated every 5 years. If your lipid or cholesterol levels are high, you are over age 50, or you are at high risk for heart disease, you may need your cholesterol levels checked more frequently.Ongoing high lipid and cholesterol levels should be treated with medicines if diet and exercise are not working.  If you smoke, find out from your health care provider how to quit. If you do not use tobacco, do not start.  Lung cancer  screening is recommended for adults aged 60-80 years who are at high risk for developing lung cancer because of a history of smoking. A yearly low-dose CT scan of the lungs is recommended for people who have at least a 30-pack-year history of smoking and are current smokers or have quit within the past 15 years. A pack year of smoking is smoking an average of 1 pack of cigarettes a Pu[place] for 1 year (for example, a 30-pack-year history of smoking could mean smoking 1 pack a [place] for 30 years or 2 packs a [place] for 15 years). Yearly screening should continue until the smoker has stopped smoking for at least 15 years. Yearly screening should be stopped for people who develop a health problem that would prevent them from having lung cancer treatment.  If you choose to drink alcohol, do not have more than 2 drinks per Errickson. One drink is considered to be 12 oz (360 mL) of beer, 5 oz (150 mL) of wine, or 1.5 oz (45 mL) of liquor.  Avoid the use of street drugs. Do not share needles with anyone. Ask for help if you need support or instructions about stopping the use of drugs.  High blood pressure causes heart disease and increases the risk of stroke. High blood pressure is more likely to develop in:  People who have blood pressure in the end of the normal range (100-139/85-89 mm Hg).  People who are overweight or obese.  People who are African American.  If you are 59-64 years of age, have your blood pressure checked every 3-5 years. If you are 28 years of age or older, have your blood pressure checked every year. You should have your blood pressure measured twice--once when you are at a hospital or clinic, and once when you are not at a hospital or clinic. Record the average of the two measurements. To check your blood pressure when you are not at a hospital or clinic, you can use:  An automated blood pressure machine at a pharmacy.  A home blood pressure monitor.  If you are 2-54 years old, ask your health  care provider if you should take aspirin to prevent heart disease.  Diabetes screening involves taking a blood sample to check your fasting blood sugar level. This should be done once every 3 years after age 68 if you are at a normal weight and without risk factors for diabetes. Testing should be considered at a younger age or be carried out more frequently if you are overweight and have at least 1 risk factor for diabetes.  Colorectal cancer can be detected and often prevented. Most routine colorectal cancer screening begins at the age of 39 and  continues through age 22. However, your health care provider may recommend screening at an earlier age if you have risk factors for colon cancer. On a yearly basis, your health care provider may provide home test kits to check for hidden blood in the stool. A small camera at the end of a tube may be used to directly examine the colon (sigmoidoscopy or colonoscopy) to detect the earliest forms of colorectal cancer. Talk to your health care provider about this at age 16 when routine screening begins. A direct exam of the colon should be repeated every 5-10 years through age 78, unless early forms of precancerous polyps or small growths are found.  People who are at an increased risk for hepatitis B should be screened for this virus. You are considered at high risk for hepatitis B if:  You were born in a country where hepatitis B occurs often. Talk with your health care provider about which countries are considered high risk.  Your parents were born in a high-risk country and you have not received a shot to protect against hepatitis B (hepatitis B vaccine).  You have HIV or AIDS.  You use needles to inject street drugs.  You live with, or have sex with, someone who has hepatitis B.  You are a man who has sex with other men (MSM).  You get hemodialysis treatment.  You take certain medicines for conditions like cancer, organ transplantation, and autoimmune  conditions.  Hepatitis C blood testing is recommended for all people born from [birth year] through 1963 and any individual with known risk factors for hepatitis C.  Healthy men should no longer receive prostate-specific antigen (PSA) blood tests as part of routine cancer screening. Talk to your health care provider about prostate cancer screening.  Testicular cancer screening is not recommended for adolescents or adult males who have no symptoms. Screening includes self-exam, a health care provider exam, and other screening tests. Consult with your health care provider about any symptoms you have or any concerns you have about testicular cancer.  Practice safe sex. Use condoms and avoid high-risk sexual practices to reduce the spread of sexually transmitted infections (STIs).  You should be screened for STIs, including gonorrhea and chlamydia if:  You are sexually active and are younger than 24 years.  You are older than 24 years, and your health care provider tells you that you are at risk for this type of infection.  Your sexual activity has changed since you were last screened, and you are at an increased risk for chlamydia or gonorrhea. Ask your health care provider if you are at risk.  If you are at risk of being infected with HIV, it is recommended that you take a prescription medicine daily to prevent HIV infection. This is called pre-exposure prophylaxis (PrEP). You are considered at risk if:  You are a man who has sex with other men (MSM).  You are a heterosexual man who is sexually active with multiple partners.  You take drugs by injection.  You are sexually active with a partner who has HIV.  Talk with your health care provider about whether you are at high risk of being infected with HIV. If you choose to begin PrEP, you should first be tested for HIV. You should then be tested every 3 months for as long as you are taking PrEP.  Use sunscreen. Apply sunscreen liberally and  repeatedly throughout the [place]. You should seek shade when your shadow is shorter than you. Protect yourself  by wearing long sleeves, pants, a wide-brimmed hat, and sunglasses year round whenever you are outdoors.  Tell your health care provider of new moles or changes in moles, especially if there is a change in shape or color. Also, tell your health care provider if a mole is larger than the size of a pencil eraser.  A one-time screening for abdominal aortic aneurysm (AAA) and surgical repair of large AAAs by ultrasound is recommended for men aged 10-75 years who are current or former smokers.  Stay current with your vaccines (immunizations).   This information is not intended to replace advice given to you by your health care provider. Make sure you discuss any questions you have with your health care provider.   Document Released: 01/17/2008 Document Revised: 08/11/2014 Document Reviewed: 12/16/2010 Elsevier Interactive Patient Education [employer].

## 2015-05-31 NOTE — Progress Notes (Signed)
   Subjective:    Patient ID: Derek Harrell, male    DOB: 03/17/1946, 69 y.o.   MRN: [id number]  HPI Here for medicare wellness, no new complaints. Please see A/P for status and treatment of chronic medical problems.   HPI #2: Having problems with memory and focus. Spoke with his friend via phone during our visit to talk about her concerns for him which he could not recall accurately. She feels that he is unable to retain information. He is not able to follow tasks with more than 1 step. He gets excessively tired a lot. He does not eat well and eats maybe 1 meal a [place]. No problems with swallowing, he is able to make his food. Denies getting lost driving but admits to night time driving problems.   Diet: poor Physical activity: sedentary Depression/mood screen: negative Hearing: intact to whispered voice Visual acuity: grossly normal, performs annual eye exam  ADLs: capable Fall risk: low Home safety: good Cognitive evaluation: intact to orientation, naming, EOL planning: adv directives discussed  I have personally reviewed and have noted 1. The patient's medical and social history - reviewed today no changes 2. Their use of alcohol, tobacco or illicit drugs 3. Their current medications and supplements 4. The patient's functional ability including ADL's, fall risks, home safety risks and hearing or visual impairment. 5. Diet and physical activities 6. Evidence for depression or mood disorders 7. Care team reviewed and updated (available in snapshot)  Review of Systems  Constitutional: Positive for unexpected weight change. Negative for fever, activity change, appetite change and fatigue.  HENT: Negative.   Eyes: Negative.   Respiratory: Negative for cough, chest tightness, shortness of breath and wheezing.   Cardiovascular: Negative for chest pain, palpitations and leg swelling.  Gastrointestinal: Negative for abdominal pain, diarrhea, constipation and abdominal distention.    Musculoskeletal: Positive for arthralgias. Negative for back pain and gait problem.  Skin: Negative.   Neurological: Negative for dizziness, speech difficulty, weakness, numbness and headaches.       Memory problems  Psychiatric/Behavioral: Positive for confusion and decreased concentration. Negative for suicidal ideas, sleep disturbance, self-injury and dysphoric mood. The patient is not nervous/anxious.       Objective:   Physical Exam  Constitutional: He is oriented to person, place, and time. He appears well-developed and well-nourished.  HENT:  Head: Normocephalic and atraumatic.  Eyes: EOM are normal.  Neck: Normal range of motion.  Cardiovascular: Normal rate and regular rhythm.   Pulmonary/Chest: Effort normal and breath sounds normal. No respiratory distress. He has no wheezes. He has no rales.  Abdominal: Soft. Bowel sounds are normal. He exhibits no distension. There is no tenderness. There is no rebound.  Musculoskeletal: He exhibits no edema.  Neurological: He is alert and oriented to person, place, and time. Coordination normal.  Skin: Skin is warm and dry.  Psychiatric: He has a normal mood and affect.   Filed Vitals:   05/31/15 0826  BP: 122/86  Pulse: 61  Temp: 97.9 F (36.6 C)  TempSrc: Oral  Resp: 12  Height: 6\' 1"  (1.854 m)  Weight: 205 lb (92.987 kg)  SpO2: 98%      Assessment & Plan:  Prevnar 13 and flu shot given at visit.

## 2015-05-31 NOTE — Progress Notes (Signed)
Pre visit review using our clinic review tool, if applicable. No additional management support is needed unless otherwise documented below in the visit note. 

## 2015-06-01 ENCOUNTER — Ambulatory Visit (INDEPENDENT_AMBULATORY_CARE_PROVIDER_SITE_OTHER): Payer: Medicare Other | Admitting: Neurology

## 2015-06-01 ENCOUNTER — Encounter: Payer: Self-pay | Admitting: Neurology

## 2015-06-01 VITALS — BP 134/85 | HR 62 | Ht 73.0 in | Wt 203.5 lb

## 2015-06-01 DIAGNOSIS — R269 Unspecified abnormalities of gait and mobility: Secondary | ICD-10-CM | POA: Diagnosis not present

## 2015-06-01 DIAGNOSIS — Z0000 Encounter for general adult medical examination without abnormal findings: Secondary | ICD-10-CM | POA: Insufficient documentation

## 2015-06-01 DIAGNOSIS — R413 Other amnesia: Secondary | ICD-10-CM | POA: Insufficient documentation

## 2015-06-01 LAB — RPR

## 2015-06-01 MED ORDER — MIRTAZAPINE 15 MG PO TABS: 15.0000 mg | ORAL_TABLET | Freq: Every day | ORAL | Status: DC

## 2015-06-01 NOTE — Assessment & Plan Note (Addendum)
Some concern that this could be related to his Parkinson's. He does live alone and still able to do all ADLs. Poor focus and recollection per his friend. He is having poor appetite and down 20-30 pounds over the last year. Weight stable from 3 months ago. Will monitor weight and if still decreasing will initiate workup. Colonoscopy is up to date. Checking labs today and he will see neurology soon as well.
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
## 2015-06-01 NOTE — Assessment & Plan Note (Signed)
Given prevnar 13 and flu at visit today. Discussed home safety and fall prevention and advanced directives today. 10 year screening recommendations given to patient at visit. Colonoscopy up to date.

## 2015-06-01 NOTE — Assessment & Plan Note (Signed)
Trazodone is helping with his sleep and suspect that this will help some with his concentration if he is sleeping enough.

## 2015-06-01 NOTE — Patient Instructions (Signed)
Fall Prevention in the Home  Falls can cause injuries and can affect people from all age groups. There are many simple things that you can do to make your home safe and to help prevent falls. WHAT CAN I DO ON THE OUTSIDE OF MY HOME?  Regularly repair the edges of walkways and driveways and fix any cracks.  Remove high doorway thresholds.  Trim any shrubbery on the main path into your home.  Use bright outdoor lighting.  Clear walkways of debris and clutter, including tools and rocks.  Regularly check that handrails are securely fastened and in good repair. Both sides of any steps should have handrails.  Install guardrails along the edges of any raised decks or porches.  Have leaves, snow, and ice cleared regularly.  Use sand or salt on walkways during winter months.  In the garage, clean up any spills right away, including grease or oil spills. WHAT CAN I DO IN THE BATHROOM?  Use night lights.  Install grab bars by the toilet and in the tub and shower. Do not use towel bars as grab bars.  Use non-skid mats or decals on the floor of the tub or shower.  If you need to sit down while you are in the shower, use a plastic, non-slip stool..  Keep the floor dry. Immediately clean up any water that spills on the floor.  Remove soap buildup in the tub or shower on a regular basis.  Attach bath mats securely with double-sided non-slip rug tape.  Remove throw rugs and other tripping hazards from the floor. WHAT CAN I DO IN THE BEDROOM?  Use night lights.  Make sure that a bedside light is easy to reach.  Do not use oversized bedding that drapes onto the floor.  Have a firm chair that has side arms to use for getting dressed.  Remove throw rugs and other tripping hazards from the floor. WHAT CAN I DO IN THE KITCHEN?   Clean up any spills right away.  Avoid walking on wet floors.  Place frequently used items in easy-to-reach places.  If you need to reach for something  above you, use a sturdy step stool that has a grab bar.  Keep electrical cables out of the way.  Do not use floor polish or wax that makes floors slippery. If you have to use wax, make sure that it is non-skid floor wax.  Remove throw rugs and other tripping hazards from the floor. WHAT CAN I DO IN THE STAIRWAYS?  Do not leave any items on the stairs.  Make sure that there are handrails on both sides of the stairs. Fix handrails that are broken or loose. Make sure that handrails are as long as the stairways.  Check any carpeting to make sure that it is firmly attached to the stairs. Fix any carpet that is loose or worn.  Avoid having throw rugs at the top or bottom of stairways, or secure the rugs with carpet tape to prevent them from moving.  Make sure that you have a light switch at the top of the stairs and the bottom of the stairs. If you do not have them, have them installed. WHAT ARE SOME OTHER FALL PREVENTION TIPS?  Wear closed-toe shoes that fit well and support your feet. Wear shoes that have rubber soles or low heels.  When you use a stepladder, make sure that it is completely opened and that the sides are firmly locked. Have someone hold the ladder while you   are using it. Do not climb a closed stepladder.  Add color or contrast paint or tape to grab bars and handrails in your home. Place contrasting color strips on the first and last steps.  Use mobility aids as needed, such as canes, walkers, scooters, and crutches.  Turn on lights if it is dark. Replace any light bulbs that burn out.  Set up furniture so that there are clear paths. Keep the furniture in the same spot.  Fix any uneven floor surfaces.  Choose a carpet design that does not hide the edge of steps of a stairway.  Be aware of any and all pets.  Review your medicines with your healthcare provider. Some medicines can cause dizziness or changes in blood pressure, which increase your risk of falling. Talk  with your health care provider about other ways that you can decrease your risk of falls. This may include working with a physical therapist or trainer to improve your strength, balance, and endurance.   This information is not intended to replace advice given to you by your health care provider. Make sure you discuss any questions you have with your health care provider.   Document Released: 07/11/2002 Document Revised: 12/05/2014 Document Reviewed: 08/25/2014 Elsevier Interactive Patient Education 2016 Elsevier Inc.  

## 2015-06-01 NOTE — Assessment & Plan Note (Signed)
BP is at goal on bystolic and amlodipine.

## 2015-06-05 ENCOUNTER — Encounter: Payer: Self-pay | Admitting: Cardiovascular Disease

## 2015-06-05 ENCOUNTER — Ambulatory Visit (INDEPENDENT_AMBULATORY_CARE_PROVIDER_SITE_OTHER): Payer: Medicare Other | Admitting: Cardiovascular Disease

## 2015-06-05 VITALS — BP 128/86 | HR 72 | Ht 73.0 in | Wt 201.0 lb

## 2015-06-05 DIAGNOSIS — I714 Abdominal aortic aneurysm, without rupture, unspecified: Secondary | ICD-10-CM

## 2015-06-05 DIAGNOSIS — E785 Hyperlipidemia, unspecified: Secondary | ICD-10-CM | POA: Diagnosis not present

## 2015-06-05 DIAGNOSIS — I10 Essential (primary) hypertension: Secondary | ICD-10-CM | POA: Diagnosis not present

## 2015-06-05 NOTE — Assessment & Plan Note (Signed)
History of abdominal aortic aneurysm with recent duplex performed 05/01/14 revealing maximum dimensions of 3.8 x 3.7 cm. We will recheck an abdominal ultrasound in the near future and annually after that.

## 2015-06-05 NOTE — Progress Notes (Signed)
06/05/2015 Deidre Ala Heyden   23-Apr-1946  [id number]  Primary Physician [name], MD Primary Cardiologist: Lorretta Harp MD [name]   HPI:  The patient is a delightful 69 year old moderately overweight divorced African American male, father of 71, grandfather to 5 grandchildren, referred through the courtesy of Dr. Pricilla Holm at [facility] for evaluation of an incidentally-noted small abdominal aortic aneurysm on CT scanning. I last saw him in the office 02/21/15.  The patient lives alone. He is retired from [employer], where he worked for 39 years. His cardiac risk factor profile is positive for hypertension and hyperlipidemia. He quit smoking 15 years ago and smoked 1/2 to 1 pack a [place] for 25 years prior to that. He does have a family history of heart disease with a father who died of an MI at age 65. He has never had a heart attack, but has had a stroke 4 to 5 years ago without residual neurologic disability. He denies chest pain or shortness of breath. A CT scan was done for evaluation of abdominal pain, which was unremarkable except for the above-noted small abdominal aortic aneurysm.  Recent abdominal ultrasound performed 05/01/14 revealed his abdominal aortic aneurysm maximum dimensions of 3.7 x 3.8 cm.  Current Outpatient Prescriptions  Medication Sig Dispense Refill  . amLODipine (NORVASC) 10 MG tablet Take 10 mg by mouth daily.    Marland Kitchen aspirin 81 MG EC tablet Take 1 tablet (81 mg total) by mouth daily. Swallow whole. 30 tablet 12  . carbidopa-levodopa (SINEMET CR) 50-200 MG per tablet TAKE 1 TABLET BY MOUTH AT 8 AM, NOON, 4 PM, AND 8 PM 120 tablet 3  . clopidogrel (PLAVIX) 75 MG tablet Take 75 mg by mouth daily.    . nebivolol (BYSTOLIC) 10 MG tablet Take 10 mg by mouth daily.    . polyethylene glycol powder (GLYCOLAX/MIRALAX) powder Take 17 g by mouth daily. 3350 g 1  . rosuvastatin (CRESTOR) 10 MG tablet Take 10 mg by mouth daily.     No current  facility-administered medications for this visit.    No Known Allergies  Social History   Social History  . Marital Status: Divorced    Spouse Name: N/A  . Number of Children: 3  . Years of Education: college   Occupational History  .  [employer]   Social History Main Topics  . Smoking status: Former Smoker -- 0.06 packs/[place] for 26 years    Types: Cigarettes    Quit date: 03/23/1993  . Smokeless tobacco: Never Used  . Alcohol Use: No     Comment: 08/22/2013 "mixed drink or glass of wine or beer once/wk"  . Drug Use: No  . Sexual Activity: Yes   Other Topics Concern  . Not on file   Social History Narrative   Patient is single, has 3 children   Patient is right handed   Education level is some college   Caffeine consumption is 1 cup daily     Review of Systems: General: negative for chills, fever, night sweats or weight changes.  Cardiovascular: negative for chest pain, dyspnea on exertion, edema, orthopnea, palpitations, paroxysmal nocturnal dyspnea or shortness of breath Dermatological: negative for rash Respiratory: negative for cough or wheezing Urologic: negative for hematuria Abdominal: negative for nausea, vomiting, diarrhea, bright red blood per rectum, melena, or hematemesis Neurologic: negative for visual changes, syncope, or dizziness All other systems reviewed and are otherwise negative except as noted above.    Blood pressure  128/86, pulse 72, height 6\' 1"  (1.854 m), weight 201 lb (91.173 kg).  General appearance: alert and no distress Neck: no adenopathy, no carotid bruit, no JVD, supple, symmetrical, trachea midline and thyroid not enlarged, symmetric, no tenderness/mass/nodules Lungs: clear to auscultation bilaterally Heart: regular rate and rhythm, S1, S2 normal, no murmur, click, rub or gallop Extremities: extremities normal, atraumatic, no cyanosis or edema  EKG not performed today  ASSESSMENT AND PLAN:   Hyperlipidemia History of  hyperlipidemia on Crestor with recent lipid profile performed by his PCP 04/30/15 revealing total cholesterol 138, LDL 75 HDL of 48.  Essential hypertension History of hypertension blood pressure measured at 120/86. He is on amlodipine and Bystolic. Continue current meds at current dosing  Abdominal aortic aneurysm.  3.7 x 3.6 cm infrarenal abdominal aortic aneurysm. History of abdominal aortic aneurysm with recent duplex performed 05/01/14 revealing maximum dimensions of 3.8 x 3.7 cm. We will recheck an abdominal ultrasound in the near future and annually after that.      Lorretta Harp MD FACP,FACC,FAHA, [facility] 06/05/2015 9:01 AM

## 2015-06-05 NOTE — Patient Instructions (Signed)
Medication Instructions:  Your physician recommends that you continue on your current medications as directed. Please refer to the Current Medication list given to you today.   Labwork: none  Testing/Procedures: Your physician has requested that you have an abdominal aorta duplex. During this test, an ultrasound is used to evaluate the aorta. Allow 30 minutes for this exam. Do not eat after midnight the [place] before and avoid carbonated beverages   Follow-Up: Your physician wants you to follow-up in: 12 months with Dr. Gwenlyn Found. You will receive a reminder letter in the mail two months in advance. If you don't receive a letter, please call our office to schedule the follow-up appointment.   Any Other Special Instructions Will Be Listed Below (If Applicable).     If you need a refill on your cardiac medications before your next appointment, please call your pharmacy.

## 2015-06-05 NOTE — Assessment & Plan Note (Signed)
History of hyperlipidemia on Crestor with recent lipid profile performed by his PCP 04/30/15 revealing total cholesterol 138, LDL 75 HDL of 48.

## 2015-06-05 NOTE — Assessment & Plan Note (Signed)
History of hypertension blood pressure measured at 120/86. He is on amlodipine and Bystolic. Continue current meds at current dosing

## 2015-07-02 ENCOUNTER — Ambulatory Visit (HOSPITAL_COMMUNITY)
Admission: RE | Admit: 2015-07-02 | Discharge: 2015-07-02 | Disposition: A | Discharge status: Home / Self Care | Payer: Medicare Other | Source: Ambulatory Visit | Attending: Cardiology | Admitting: Cardiology

## 2015-07-02 DIAGNOSIS — I10 Essential (primary) hypertension: Secondary | ICD-10-CM | POA: Insufficient documentation

## 2015-07-02 DIAGNOSIS — I700 Atherosclerosis of aorta: Secondary | ICD-10-CM | POA: Diagnosis not present

## 2015-07-02 DIAGNOSIS — I714 Abdominal aortic aneurysm, without rupture, unspecified: Secondary | ICD-10-CM

## 2015-07-02 DIAGNOSIS — E785 Hyperlipidemia, unspecified: Secondary | ICD-10-CM | POA: Insufficient documentation

## 2015-07-30 ENCOUNTER — Emergency Department (HOSPITAL_COMMUNITY)
Admission: EM | Admit: 2015-07-30 | Discharge: 2015-07-30 | Disposition: A | Discharge status: Home / Self Care | Payer: Medicare Other | Source: Home / Self Care

## 2015-07-30 ENCOUNTER — Encounter (HOSPITAL_COMMUNITY): Payer: Self-pay | Admitting: Emergency Medicine

## 2015-07-30 DIAGNOSIS — J069 Acute upper respiratory infection, unspecified: Secondary | ICD-10-CM

## 2015-07-30 MED ORDER — AMOXICILLIN 500 MG PO CAPS: 500.0000 mg | ORAL_CAPSULE | Freq: Three times a day (TID) | ORAL | Status: DC

## 2015-07-30 MED ORDER — FLUTICASONE PROPIONATE 50 MCG/ACT NA SUSP: 1.0000 | Freq: Every day | NASAL | Status: DC
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
## 2015-07-30 NOTE — Discharge Instructions (Signed)
Upper Respiratory Infection, Adult Most upper respiratory infections (URIs) are a viral infection of the air passages leading to the lungs. A URI affects the nose, throat, and upper air passages. The most common type of URI is nasopharyngitis and is typically referred to as "the common cold." URIs run their course and usually go away on their own. Most of the time, a URI does not require medical attention, but sometimes a bacterial infection in the upper airways can follow a viral infection. This is called a secondary infection. Sinus and middle ear infections are common types of secondary upper respiratory infections. Bacterial pneumonia can also complicate a URI. A URI can worsen asthma and chronic obstructive pulmonary disease (COPD). Sometimes, these complications can require emergency medical care and may be life threatening.  CAUSES Almost all URIs are caused by viruses. A virus is a type of germ and can spread from one person to another.  RISKS FACTORS You may be at risk for a URI if:   You smoke.   You have chronic heart or lung disease.  You have a weakened defense (immune) system.   You are very young or very old.   You have nasal allergies or asthma.  You work in crowded or poorly ventilated areas.  You work in health care facilities or schools. SIGNS AND SYMPTOMS  Symptoms typically develop 2-3 days after you come in contact with a cold virus. Most viral URIs last 7-10 days. However, viral URIs from the influenza virus (flu virus) can last 14-18 days and are typically more severe. Symptoms may include:   Runny or stuffy (congested) nose.   Sneezing.   Cough.   Sore throat.   Headache.   Fatigue.   Fever.   Loss of appetite.   Pain in your forehead, behind your eyes, and over your cheekbones (sinus pain).  Muscle aches.  DIAGNOSIS  Your health care provider may diagnose a URI by:  Physical exam.  Tests to check that your symptoms are not due to  another condition such as:  Strep throat.  Sinusitis.  Pneumonia.  Asthma. TREATMENT  A URI goes away on its own with time. It cannot be cured with medicines, but medicines may be prescribed or recommended to relieve symptoms. Medicines may help:  Reduce your fever.  Reduce your cough.  Relieve nasal congestion. HOME CARE INSTRUCTIONS   Take medicines only as directed by your health care provider.   Gargle warm saltwater or take cough drops to comfort your throat as directed by your health care provider.  Use a warm mist humidifier or inhale steam from a shower to increase air moisture. This may make it easier to breathe.  Drink enough fluid to keep your urine clear or pale yellow.   Eat soups and other clear broths and maintain good nutrition.   Rest as needed.   Return to work when your temperature has returned to normal or as your health care provider advises. You may need to stay home longer to avoid infecting others. You can also use a face mask and careful hand washing to prevent spread of the virus.  Increase the usage of your inhaler if you have asthma.   Do not use any tobacco products, including cigarettes, chewing tobacco, or electronic cigarettes. If you need help quitting, ask your health care provider. PREVENTION  The best way to protect yourself from getting a cold is to practice good hygiene.   Avoid oral or hand contact with people with cold   symptoms.   Wash your hands often if contact occurs.  There is no clear evidence that vitamin C, vitamin E, echinacea, or exercise reduces the chance of developing a cold. However, it is always recommended to get plenty of rest, exercise, and practice good nutrition.  SEEK MEDICAL CARE IF:   You are getting worse rather than better.   Your symptoms are not controlled by medicine.   You have chills.  You have worsening shortness of breath.  You have brown or red mucus.  You have yellow or brown nasal  discharge.  You have pain in your face, especially when you bend forward.  You have a fever.  You have swollen neck glands.  You have pain while swallowing.  You have white areas in the back of your throat. SEEK IMMEDIATE MEDICAL CARE IF:   You have severe or persistent:  Headache.  Ear pain.  Sinus pain.  Chest pain.  You have chronic lung disease and any of the following:  Wheezing.  Prolonged cough.  Coughing up blood.  A change in your usual mucus.  You have a stiff neck.  You have changes in your:  Vision.  Hearing.  Thinking.  Mood. MAKE SURE YOU:   Understand these instructions.  Will watch your condition.  Will get help right away if you are not doing well or get worse.   This information is not intended to replace advice given to you by your health care provider. Make sure you discuss any questions you have with your health care provider.   Document Released: 01/14/2001 Document Revised: 12/05/2014 Document Reviewed: 10/26/2013 Elsevier Interactive Patient Education 2016 [employer] Inc.  

## 2015-07-30 NOTE — ED Notes (Signed)
Cough that started Saturday, non-productive cough, chest congestion, sore throat initially, no now

## 2015-08-02 ENCOUNTER — Other Ambulatory Visit: Payer: Self-pay | Admitting: Neurology

## 2015-08-15 ENCOUNTER — Other Ambulatory Visit: Payer: Self-pay | Admitting: Neurology

## 2015-08-15 NOTE — Telephone Encounter (Signed)
Last OV note says: Sinemet, currently on the 50/200 mg tablets 3 times daily I spoke with patient who said he has been taking four times daily.

## 2015-08-31 ENCOUNTER — Encounter: Payer: Self-pay | Admitting: Internal Medicine

## 2015-08-31 ENCOUNTER — Ambulatory Visit (INDEPENDENT_AMBULATORY_CARE_PROVIDER_SITE_OTHER): Payer: Medicare Other | Admitting: Internal Medicine

## 2015-08-31 VITALS — BP 132/76 | HR 55 | Temp 98.3°F | Resp 14 | Ht 73.0 in | Wt 202.0 lb

## 2015-08-31 DIAGNOSIS — E538 Deficiency of other specified B group vitamins: Secondary | ICD-10-CM | POA: Diagnosis not present

## 2015-08-31 DIAGNOSIS — Z23 Encounter for immunization: Secondary | ICD-10-CM

## 2015-08-31 MED ORDER — CYANOCOBALAMIN 1000 MCG/ML IJ SOLN
1000.0000 ug | Freq: Once | INTRAMUSCULAR | Status: AC
  Administered 2015-08-31: 1000 ug via INTRAMUSCULAR

## 2015-08-31 NOTE — Progress Notes (Signed)
   Subjective:    Patient ID: Derek Harrell, male    DOB: [birth year], 70 y.o.   MRN: [id number]  HPI The patient is a 70 YO man coming in who is tired most of the time. Labs last time revealed low-normal B12 levels. He forgot to start a supplement over the counter. Wants to know if we could give him a shot. No other new problems.   Review of Systems  Constitutional: Negative for fever, activity change, appetite change, fatigue and unexpected weight change.  Respiratory: Negative for cough, chest tightness, shortness of breath and wheezing.   Cardiovascular: Negative for chest pain, palpitations and leg swelling.  Gastrointestinal: Negative for abdominal pain, diarrhea, constipation and abdominal distention.  Musculoskeletal: Positive for arthralgias. Negative for back pain and gait problem.  Skin: Negative.   Neurological: Negative for dizziness, speech difficulty, weakness, numbness and headaches.       Memory problems  Psychiatric/Behavioral: Positive for confusion and decreased concentration. Negative for suicidal ideas, sleep disturbance, self-injury and dysphoric mood. The patient is not nervous/anxious.       Objective:   Physical Exam  Constitutional: He is oriented to person, place, and time. He appears well-developed and well-nourished.  HENT:  Head: Normocephalic and atraumatic.  Eyes: EOM are normal.  Neck: Normal range of motion.  Cardiovascular: Normal rate and regular rhythm.   Pulmonary/Chest: Effort normal and breath sounds normal. No respiratory distress. He has no wheezes. He has no rales.  Abdominal: Soft. Bowel sounds are normal. He exhibits no distension. There is no tenderness. There is no rebound.  Musculoskeletal: He exhibits no edema.  Neurological: He is alert and oriented to person, place, and time. Coordination normal.  Skin: Skin is warm and dry.  Psychiatric: He has a normal mood and affect.   Filed Vitals:   08/31/15 1335  BP: 132/76  Pulse: 55  Temp:  98.3 F (36.8 C)  TempSrc: Oral  Resp: 14  Height: 6\' 1"  (1.854 m)  Weight: 202 lb (91.627 kg)  SpO2: 97%      Assessment & Plan:  B12 and tetanus given at visit.

## 2015-08-31 NOTE — Progress Notes (Signed)
Pre visit review using our clinic review tool, if applicable. No additional management support is needed unless otherwise documented below in the visit note. 

## 2015-08-31 NOTE — Addendum Note (Signed)
Addended by: Resa Miner R on: 08/31/2015 04:36 PM   Modules accepted: Orders

## 2015-08-31 NOTE — Patient Instructions (Signed)
We have given you the tetanus and B12 shot today.   If you are feeling more energy then you can come and get the B12 shots every month at the office.

## 2015-08-31 NOTE — Assessment & Plan Note (Signed)
Will treat with B12 to see if he feels better. Labs are low normal on prior draw. Has not started a supplement.

## 2015-09-09 ENCOUNTER — Encounter (HOSPITAL_COMMUNITY): Payer: Self-pay | Admitting: Emergency Medicine

## 2015-09-09 ENCOUNTER — Other Ambulatory Visit: Payer: Self-pay

## 2015-09-09 ENCOUNTER — Emergency Department (HOSPITAL_COMMUNITY): Payer: Medicare Other

## 2015-09-09 ENCOUNTER — Emergency Department (HOSPITAL_COMMUNITY)
Admission: EM | Admit: 2015-09-09 | Discharge: 2015-09-09 | Disposition: A | Discharge status: Home / Self Care | Payer: Medicare Other | Attending: Emergency Medicine | Admitting: Emergency Medicine

## 2015-09-09 DIAGNOSIS — I10 Essential (primary) hypertension: Secondary | ICD-10-CM | POA: Diagnosis not present

## 2015-09-09 DIAGNOSIS — Z79899 Other long term (current) drug therapy: Secondary | ICD-10-CM | POA: Insufficient documentation

## 2015-09-09 DIAGNOSIS — E669 Obesity, unspecified: Secondary | ICD-10-CM | POA: Insufficient documentation

## 2015-09-09 DIAGNOSIS — Z8739 Personal history of other diseases of the musculoskeletal system and connective tissue: Secondary | ICD-10-CM | POA: Diagnosis not present

## 2015-09-09 DIAGNOSIS — Z8669 Personal history of other diseases of the nervous system and sense organs: Secondary | ICD-10-CM | POA: Diagnosis not present

## 2015-09-09 DIAGNOSIS — Z7902 Long term (current) use of antithrombotics/antiplatelets: Secondary | ICD-10-CM | POA: Diagnosis not present

## 2015-09-09 DIAGNOSIS — Z8673 Personal history of transient ischemic attack (TIA), and cerebral infarction without residual deficits: Secondary | ICD-10-CM | POA: Diagnosis not present

## 2015-09-09 DIAGNOSIS — G20 Parkinson's disease: Secondary | ICD-10-CM | POA: Insufficient documentation

## 2015-09-09 DIAGNOSIS — Z7982 Long term (current) use of aspirin: Secondary | ICD-10-CM | POA: Insufficient documentation

## 2015-09-09 DIAGNOSIS — H532 Diplopia: Secondary | ICD-10-CM | POA: Diagnosis not present

## 2015-09-09 DIAGNOSIS — E785 Hyperlipidemia, unspecified: Secondary | ICD-10-CM | POA: Diagnosis not present

## 2015-09-09 DIAGNOSIS — Z87891 Personal history of nicotine dependence: Secondary | ICD-10-CM | POA: Diagnosis not present

## 2015-09-09 LAB — COMPREHENSIVE METABOLIC PANEL
ALBUMIN: 3.8 g/dL (ref 3.5–5.0)
ALK PHOS: 53 U/L (ref 38–126)
ALT: 17 U/L (ref 17–63)
AST: 20 U/L (ref 15–41)
Anion gap: 9 (ref 5–15)
BUN: 13 mg/dL (ref 6–20)
CALCIUM: 9.4 mg/dL (ref 8.9–10.3)
CHLORIDE: 107 mmol/L (ref 101–111)
CO2: 27 mmol/L (ref 22–32)
CREATININE: 0.84 mg/dL (ref 0.61–1.24)
GFR calc Af Amer: >60 mL/min (ref >60)
GFR calc non Af Amer: >60 mL/min (ref >60)
GLUCOSE: 92 mg/dL (ref 65–99)
Potassium: 4.3 mmol/L (ref 3.5–5.1)
SODIUM: 143 mmol/L (ref 135–145)
Total Bilirubin: 1.2 mg/dL (ref 0.3–1.2)
Total Protein: 7 g/dL (ref 6.5–8.1)

## 2015-09-09 LAB — CBC
HCT: 45.2 % (ref 39.0–52.0)
Hemoglobin: 15.5 g/dL (ref 13.0–17.0)
MCH: 27.7 pg (ref 26.0–34.0)
MCHC: 34.3 g/dL (ref 30.0–36.0)
MCV: 80.7 fL (ref 78.0–100.0)
PLATELETS: 157 10*3/uL (ref 150–400)
RBC: 5.6 MIL/uL (ref 4.22–5.81)
RDW: 13.5 % (ref 11.5–15.5)
WBC: 7.1 10*3/uL (ref 4.0–10.5)

## 2015-09-09 LAB — I-STAT CHEM 8, ED
BUN: 15 mg/dL (ref 6–20)
CHLORIDE: 106 mmol/L (ref 101–111)
CREATININE: 0.8 mg/dL (ref 0.61–1.24)
Calcium, Ion: 1.22 mmol/L (ref 1.13–1.30)
GLUCOSE: 89 mg/dL (ref 65–99)
HEMATOCRIT: 47 % (ref 39.0–52.0)
HEMOGLOBIN: 16 g/dL (ref 13.0–17.0)
POTASSIUM: 4.1 mmol/L (ref 3.5–5.1)
Sodium: 144 mmol/L (ref 135–145)
TCO2: 26 mmol/L (ref 0–100)

## 2015-09-09 LAB — CBG MONITORING, ED: Glucose-Capillary: 86 mg/dL (ref 65–99)

## 2015-09-09 LAB — I-STAT TROPONIN, ED: Troponin i, poc: 0 ng/mL (ref 0.00–0.08)

## 2015-09-09 LAB — PROTIME-INR
INR: 1.22 (ref 0.00–1.49)
PROTHROMBIN TIME: 15.5 s — AB (ref 11.6–15.2)

## 2015-09-09 LAB — APTT: APTT: 29 s (ref 24–37)

## 2015-09-09 LAB — DIFFERENTIAL
BASOS ABS: 0.1 10*3/uL (ref 0.0–0.1)
BASOS PCT: 1 %
Eosinophils Absolute: 0.3 10*3/uL (ref 0.0–0.7)
Eosinophils Relative: 4 %
LYMPHS PCT: 19 %
Lymphs Abs: 1.4 10*3/uL (ref 0.7–4.0)
Monocytes Absolute: 0.6 10*3/uL (ref 0.1–1.0)
Monocytes Relative: 8 %
NEUTROS ABS: 4.9 10*3/uL (ref 1.7–7.7)
NEUTROS PCT: 68 %

## 2015-09-09 MED ORDER — LORAZEPAM 1 MG PO TABS
2.0000 mg | ORAL_TABLET | Freq: Once | ORAL | Status: AC
  Administered 2015-09-09: 2 mg via ORAL
  Filled 2015-09-09: qty 2

## 2015-09-09 NOTE — ED Notes (Signed)
Pt states that he started having double vision yesterday in right eye. Was diagnosed with parkinson's x 2 years ago, and has had increased shuffling gate in past 2 days. No slurred speech. No weakness.
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
## 2015-09-09 NOTE — Discharge Instructions (Signed)
Diplopia °Diplopia is the condition of having double vision or seeing two of a single object. There are many causes of diplopia. Some are not dangerous and can be easily corrected. Diplopia may also be a symptom of a serious medical problem. °There are two types of diplopia. °· Monocular diplopia. This is double vision that affects only one eye. Monocular diplopia is often caused by a clouding of the lens in your eye (cataract) or by disruptions in the way that your eye focuses light. °· Binocular diplopia. This is double vision that affects both eyes. However, when you shut one eye, the double vision will go away. Binocular diplopia may be more serious. It can be caused by: °¨ Problems with the nerves or muscles that are responsible for eye movement. °¨ Neurologic diseases. °¨ Thyroid problems. °¨ Tumors. °¨ An infection near your eyes. °¨ A stroke. °You may need to see a health care provider who specializes in eye conditions (ophthalmologist) or a nerve specialist (neurologist) to find the cause. °HOME CARE INSTRUCTIONS °· Tell your health care provider about any changes in your vision. °· Do not drive or operate heavy machinery if diplopia interferes with your vision. °· Keep all follow-up visits as directed by your health care provider. This is important. °SEEK MEDICAL CARE IF: °· Your diplopia gets worse. °· You develop any other symptoms along with your diplopia, such as: °¨ Weakness. °¨ Numbness. °¨ Headache. °¨ Eye pain. °¨ Clumsiness. °¨ Nausea. °¨ Drooping eyelids. °¨ Abnormal movement of one of your eyes. °SEEK IMMEDIATE MEDICAL CARE IF: °· You have sudden vision loss. °· You suddenly get a very bad headache. °· You have sudden weakness or numbness. °· You suddenly lose the ability to speak, understand speech, or both. °  °This information is not intended to replace advice given to you by your health care provider. Make sure you discuss any questions you have with your health care provider. °  °Document  Released: 05/22/2004 Document Revised: 12/05/2014 Document Reviewed: 06/14/2014 °Elsevier Interactive Patient Education ©2016 Elsevier Inc. ° °

## 2015-09-09 NOTE — ED Notes (Signed)
Pt transported to MRI 

## 2015-09-11 ENCOUNTER — Telehealth: Payer: Self-pay

## 2015-09-11 NOTE — Telephone Encounter (Signed)
I called patient and LVM asking him to call me back. Dr. [name] would like to see him in the office in the next couple of weeks. There is currently an open appointment 09/13/15 that I wanted to offer to the patient. Please schedule him for this time if it works for him and is still open.

## 2015-09-11 NOTE — Telephone Encounter (Signed)
Patient called back. Spoke to [place]. Appointment scheduled 2/9 at 8 AM.

## 2015-09-13 ENCOUNTER — Ambulatory Visit (INDEPENDENT_AMBULATORY_CARE_PROVIDER_SITE_OTHER): Payer: Medicare Other | Admitting: Neurology

## 2015-09-13 ENCOUNTER — Encounter: Payer: Self-pay | Admitting: Neurology

## 2015-09-13 VITALS — BP 127/76 | HR 63 | Ht 73.0 in | Wt 200.5 lb

## 2015-09-13 DIAGNOSIS — H532 Diplopia: Secondary | ICD-10-CM

## 2015-09-13 DIAGNOSIS — R269 Unspecified abnormalities of gait and mobility: Secondary | ICD-10-CM

## 2015-09-13 DIAGNOSIS — R413 Other amnesia: Secondary | ICD-10-CM

## 2015-09-13 DIAGNOSIS — R5382 Chronic fatigue, unspecified: Secondary | ICD-10-CM | POA: Diagnosis not present

## 2015-09-13 NOTE — Patient Instructions (Signed)
Diplopia °Diplopia is the condition of having double vision or seeing two of a single object. There are many causes of diplopia. Some are not dangerous and can be easily corrected. Diplopia may also be a symptom of a serious medical problem. °There are two types of diplopia. °· Monocular diplopia. This is double vision that affects only one eye. Monocular diplopia is often caused by a clouding of the lens in your eye (cataract) or by disruptions in the way that your eye focuses light. °· Binocular diplopia. This is double vision that affects both eyes. However, when you shut one eye, the double vision will go away. Binocular diplopia may be more serious. It can be caused by: °¨ Problems with the nerves or muscles that are responsible for eye movement. °¨ Neurologic diseases. °¨ Thyroid problems. °¨ Tumors. °¨ An infection near your eyes. °¨ A stroke. °You may need to see a health care provider who specializes in eye conditions (ophthalmologist) or a nerve specialist (neurologist) to find the cause. °HOME CARE INSTRUCTIONS °· Tell your health care provider about any changes in your vision. °· Do not drive or operate heavy machinery if diplopia interferes with your vision. °· Keep all follow-up visits as directed by your health care provider. This is important. °SEEK MEDICAL CARE IF: °· Your diplopia gets worse. °· You develop any other symptoms along with your diplopia, such as: °¨ Weakness. °¨ Numbness. °¨ Headache. °¨ Eye pain. °¨ Clumsiness. °¨ Nausea. °¨ Drooping eyelids. °¨ Abnormal movement of one of your eyes. °SEEK IMMEDIATE MEDICAL CARE IF: °· You have sudden vision loss. °· You suddenly get a very bad headache. °· You have sudden weakness or numbness. °· You suddenly lose the ability to speak, understand speech, or both. °  °This information is not intended to replace advice given to you by your health care provider. Make sure you discuss any questions you have with your health care provider. °  °Document  Released: 05/22/2004 Document Revised: 12/05/2014 Document Reviewed: 06/14/2014 °Elsevier Interactive Patient Education ©2016 [employer] Inc. ° °
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
## 2015-09-14 LAB — TSH: TSH: 1.08 u[IU]/mL (ref 0.450–4.500)

## 2015-09-14 LAB — ANA W/REFLEX: ANA: NEGATIVE

## 2015-09-14 LAB — SEDIMENTATION RATE: SED RATE: 2 mm/h (ref 0–30)

## 2015-09-14 LAB — ANGIOTENSIN CONVERTING ENZYME: ANGIO CONVERT ENZYME: 34 U/L (ref 14–82)

## 2015-09-14 LAB — ACETYLCHOLINE RECEPTOR, BINDING: AChR Binding Ab, Serum: 0.47 nmol/L — ABNORMAL HIGH (ref 0.00–0.24)

## 2015-09-16 ENCOUNTER — Telehealth: Payer: Self-pay | Admitting: Neurology

## 2015-09-16 NOTE — Telephone Encounter (Signed)
I called the patient. The blood work is positive for a low titer ACH-receptor antibody. The patient may have MG, I would not treat for now unless he begins having more symptoms.

## 2015-09-18 ENCOUNTER — Telehealth: Payer: Self-pay | Admitting: Neurology

## 2015-09-18 NOTE — Telephone Encounter (Signed)
The patient called back, I discussed the blood work results with him, the [facility] Receptor antibody was minimally positive, he could have myasthenia gravis. If the double vision recurs, we will consider treatment with Mestinon.

## 2015-10-05 ENCOUNTER — Ambulatory Visit: Payer: Medicare Other | Admitting: Neurology

## 2015-10-29 ENCOUNTER — Encounter: Payer: 59 | Admitting: Internal Medicine

## 2016-01-07 ENCOUNTER — Ambulatory Visit (INDEPENDENT_AMBULATORY_CARE_PROVIDER_SITE_OTHER): Payer: Medicare Other | Admitting: Neurology

## 2016-01-07 ENCOUNTER — Encounter: Payer: Self-pay | Admitting: Neurology

## 2016-01-07 VITALS — BP 120/82 | HR 62 | Ht 73.0 in | Wt 197.5 lb

## 2016-01-07 DIAGNOSIS — G7000 Myasthenia gravis without (acute) exacerbation: Secondary | ICD-10-CM | POA: Diagnosis not present

## 2016-01-07 DIAGNOSIS — R5382 Chronic fatigue, unspecified: Secondary | ICD-10-CM | POA: Diagnosis not present

## 2016-01-07 DIAGNOSIS — E538 Deficiency of other specified B group vitamins: Secondary | ICD-10-CM | POA: Diagnosis not present

## 2016-01-07 DIAGNOSIS — R269 Unspecified abnormalities of gait and mobility: Secondary | ICD-10-CM | POA: Diagnosis not present

## 2016-01-07 HISTORY — DX: Myasthenia gravis without (acute) exacerbation: G70.00
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
## 2016-01-08 ENCOUNTER — Telehealth: Payer: Self-pay

## 2016-01-08 LAB — CBC WITH DIFFERENTIAL/PLATELET
BASOS: 1 %
Basophils Absolute: 0.1 10*3/uL (ref 0.0–0.2)
EOS (ABSOLUTE): 0.3 10*3/uL (ref 0.0–0.4)
EOS: 5 %
HEMATOCRIT: 43.5 % (ref 37.5–51.0)
HEMOGLOBIN: 14.8 g/dL (ref 12.6–17.7)
IMMATURE GRANS (ABS): 0 10*3/uL (ref 0.0–0.1)
IMMATURE GRANULOCYTES: 0 %
LYMPHS: 23 %
Lymphocytes Absolute: 1.5 10*3/uL (ref 0.7–3.1)
MCH: 27.3 pg (ref 26.6–33.0)
MCHC: 34 g/dL (ref 31.5–35.7)
MCV: 80 fL (ref 79–97)
MONOCYTES: 8 %
Monocytes Absolute: 0.5 10*3/uL (ref 0.1–0.9)
NEUTROS PCT: 63 %
Neutrophils Absolute: 4.1 10*3/uL (ref 1.4–7.0)
PLATELETS: 179 10*3/uL (ref 150–379)
RBC: 5.43 x10E6/uL (ref 4.14–5.80)
RDW: 13.8 % (ref 12.3–15.4)
WBC: 6.6 10*3/uL (ref 3.4–10.8)

## 2016-01-08 LAB — COMPREHENSIVE METABOLIC PANEL
ALT: 9 IU/L (ref 0–44)
AST: 16 IU/L (ref 0–40)
Albumin/Globulin Ratio: 1.3 (ref 1.2–2.2)
Albumin: 4 g/dL (ref 3.6–4.8)
Alkaline Phosphatase: 53 IU/L (ref 39–117)
BUN/Creatinine Ratio: 12 (ref 10–24)
BUN: 9 mg/dL (ref 8–27)
Bilirubin Total: 0.7 mg/dL (ref 0.0–1.2)
CALCIUM: 9.1 mg/dL (ref 8.6–10.2)
CO2: 22 mmol/L (ref 18–29)
CREATININE: 0.78 mg/dL (ref 0.76–1.27)
Chloride: 103 mmol/L (ref 96–106)
GFR, EST AFRICAN AMERICAN: 107 mL/min/{1.73_m2} (ref >59)
GFR, EST NON AFRICAN AMERICAN: 93 mL/min/{1.73_m2} (ref >59)
Globulin, Total: 3.1 g/dL (ref 1.5–4.5)
Glucose: 101 mg/dL — ABNORMAL HIGH (ref 65–99)
Potassium: 4.3 mmol/L (ref 3.5–5.2)
Sodium: 142 mmol/L (ref 134–144)
TOTAL PROTEIN: 7.1 g/dL (ref 6.0–8.5)

## 2016-01-08 LAB — VITAMIN B12: VITAMIN B 12: 510 pg/mL (ref 211–946)

## 2016-01-08 LAB — TSH: TSH: 0.729 u[IU]/mL (ref 0.450–4.500)

## 2016-01-08 NOTE — Telephone Encounter (Signed)
-----   Message from Kathrynn Ducking, MD sent at 01/08/2016  7:20 AM EDT -----  The blood work results are unremarkable. Please call the patient.  ----- Message -----    From: [facility] Lab Results In Interface    Sent: 01/08/2016   5:39 AM      To: Kathrynn Ducking, MD

## 2016-01-08 NOTE — Telephone Encounter (Signed)
Called pt w/ unremarkable lab results. Verbalized understanding and appreciation for call. 

## 2016-01-17 ENCOUNTER — Ambulatory Visit: Payer: Medicare Other | Admitting: Neurology

## 2016-01-18 ENCOUNTER — Ambulatory Visit
Admission: RE | Admit: 2016-01-18 | Discharge: 2016-01-18 | Disposition: A | Discharge status: Home / Self Care | Payer: Medicare Other | Source: Ambulatory Visit | Attending: Neurology | Admitting: Neurology

## 2016-01-18 DIAGNOSIS — G7000 Myasthenia gravis without (acute) exacerbation: Secondary | ICD-10-CM

## 2016-01-20 ENCOUNTER — Telehealth: Payer: Self-pay | Admitting: Neurology

## 2016-01-20 NOTE — Telephone Encounter (Signed)
I called patient. The CT the chest did not show a thymoma, there are changes that could be consistent with reflux, the patient denies any symptoms of this.   CT chest results 01/18/16:  IMPRESSION: No evidence of thymoma.  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  Question mild diffuse wall thickening of the distal esophagus, nonspecific, can be seen with reflux disease and infection, tumor considered unlikely due to length of involvement ; consider followup esophagram to assess.

## 2016-02-01 ENCOUNTER — Other Ambulatory Visit: Payer: Self-pay | Admitting: Specialist

## 2016-02-01 DIAGNOSIS — M25561 Pain in right knee: Secondary | ICD-10-CM

## 2016-02-06 ENCOUNTER — Other Ambulatory Visit: Payer: Self-pay | Admitting: Neurology

## 2016-02-12 ENCOUNTER — Ambulatory Visit
Admission: RE | Admit: 2016-02-12 | Discharge: 2016-02-12 | Disposition: A | Discharge status: Home / Self Care | Payer: Medicare Other | Source: Ambulatory Visit | Attending: Specialist | Admitting: Specialist

## 2016-02-12 DIAGNOSIS — M25561 Pain in right knee: Secondary | ICD-10-CM

## 2016-02-25 ENCOUNTER — Ambulatory Visit (INDEPENDENT_AMBULATORY_CARE_PROVIDER_SITE_OTHER): Payer: Medicare Other | Admitting: Internal Medicine

## 2016-02-25 ENCOUNTER — Encounter: Payer: Self-pay | Admitting: Internal Medicine

## 2016-02-25 DIAGNOSIS — I10 Essential (primary) hypertension: Secondary | ICD-10-CM

## 2016-02-25 DIAGNOSIS — E669 Obesity, unspecified: Secondary | ICD-10-CM

## 2016-02-25 NOTE — Patient Instructions (Signed)
We do not need to make any changes today and do not need any blood work.

## 2016-02-25 NOTE — Assessment & Plan Note (Signed)
Weight is down about 1-2 pounds in the last month intentionally.

## 2016-02-25 NOTE — Progress Notes (Signed)
   Subjective:    Patient ID: Derek Harrell, male    DOB: January 08, 1946, 70 y.o.   MRN: [id number]  HPI The patient is a 70 YO man coming in for follow up of his blood pressure. He is still taking his amlodipine and nebivolol without side effects. Denies headaches or chest pains. No SOB or abdominal pain. Exercising small amount as his fatigue is slightly better than last time. No new concerns. Some varicose veins on his right leg and one is slightly larger than it has been, non-tender.   Review of Systems  Constitutional: Negative for activity change, appetite change, fatigue, fever and unexpected weight change.  Respiratory: Negative for cough, chest tightness, shortness of breath and wheezing.   Cardiovascular: Negative for chest pain, palpitations and leg swelling.  Gastrointestinal: Negative for abdominal distention, abdominal pain, constipation and diarrhea.  Musculoskeletal: Negative for back pain and gait problem.  Skin: Negative.   Neurological: Negative for dizziness, speech difficulty, weakness, numbness and headaches.       Memory problems  Psychiatric/Behavioral: Positive for confusion. Negative for dysphoric mood, self-injury, sleep disturbance and suicidal ideas. The patient is not nervous/anxious.       Objective:   Physical Exam  Constitutional: He is oriented to person, place, and time. He appears well-developed and well-nourished.  HENT:  Head: Normocephalic and atraumatic.  Eyes: EOM are normal.  Neck: Normal range of motion.  Cardiovascular: Normal rate and regular rhythm.   Pulmonary/Chest: Effort normal and breath sounds normal. No respiratory distress. He has no wheezes. He has no rales.  Abdominal: Soft. He exhibits no distension. There is no tenderness. There is no rebound.  Musculoskeletal: He exhibits no edema.  Neurological: He is alert and oriented to person, place, and time. Coordination normal.  Skin: Skin is warm and dry.   Vitals:   02/25/16 1045  BP:  120/78  Pulse: 65  Resp: 18  Temp: 98.7 F (37.1 C)  TempSrc: Oral  SpO2: 97%  Weight: 196 lb 12.8 oz (89.3 kg)  Height: 6\' 1"  (1.854 m)      Assessment & Plan:

## 2016-02-25 NOTE — Progress Notes (Signed)
Pre visit review using our clinic review tool, if applicable. No additional management support is needed unless otherwise documented below in the visit note. 

## 2016-02-25 NOTE — Assessment & Plan Note (Signed)
BP at goal on his amlodipine and nebivolol. Recent CMP at goal and no changes today.

## 2016-04-10 ENCOUNTER — Ambulatory Visit
Admission: RE | Admit: 2016-04-10 | Discharge: 2016-04-10 | Disposition: A | Discharge status: Home / Self Care | Payer: Medicare Other | Source: Ambulatory Visit | Attending: Cardiology | Admitting: Cardiology

## 2016-04-10 ENCOUNTER — Other Ambulatory Visit: Payer: Self-pay | Admitting: Cardiology

## 2016-04-10 DIAGNOSIS — R634 Abnormal weight loss: Secondary | ICD-10-CM

## 2016-06-09 ENCOUNTER — Encounter: Payer: Self-pay | Admitting: Neurology

## 2016-06-09 ENCOUNTER — Ambulatory Visit (INDEPENDENT_AMBULATORY_CARE_PROVIDER_SITE_OTHER): Payer: Medicare Other | Admitting: Neurology

## 2016-06-09 VITALS — BP 95/62 | HR 69 | Ht 73.0 in | Wt 184.5 lb

## 2016-06-09 DIAGNOSIS — G2581 Restless legs syndrome: Secondary | ICD-10-CM

## 2016-06-09 DIAGNOSIS — G7000 Myasthenia gravis without (acute) exacerbation: Secondary | ICD-10-CM

## 2016-06-09 DIAGNOSIS — R269 Unspecified abnormalities of gait and mobility: Secondary | ICD-10-CM | POA: Diagnosis not present

## 2016-06-09 HISTORY — DX: Restless legs syndrome: G25.81
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
## 2016-06-09 NOTE — Patient Instructions (Addendum)
   With the Sinemet (Carbidopa) take one tablet twice a [place] for 3 weeks, then take one a [place] for 3 weeks, then stop.

## 2016-06-11 ENCOUNTER — Ambulatory Visit (INDEPENDENT_AMBULATORY_CARE_PROVIDER_SITE_OTHER): Payer: Medicare Other | Admitting: Internal Medicine

## 2016-06-11 ENCOUNTER — Other Ambulatory Visit (INDEPENDENT_AMBULATORY_CARE_PROVIDER_SITE_OTHER): Payer: Medicare Other

## 2016-06-11 ENCOUNTER — Encounter: Payer: Self-pay | Admitting: Internal Medicine

## 2016-06-11 VITALS — BP 110/64 | HR 66 | Temp 99.0°F | Resp 12 | Ht 73.0 in | Wt 187.0 lb

## 2016-06-11 DIAGNOSIS — Z23 Encounter for immunization: Secondary | ICD-10-CM | POA: Diagnosis not present

## 2016-06-11 DIAGNOSIS — R5383 Other fatigue: Secondary | ICD-10-CM

## 2016-06-11 DIAGNOSIS — Z1159 Encounter for screening for other viral diseases: Secondary | ICD-10-CM

## 2016-06-11 DIAGNOSIS — I10 Essential (primary) hypertension: Secondary | ICD-10-CM | POA: Diagnosis not present

## 2016-06-11 LAB — CBC
HCT: 45.2 % (ref 39.0–52.0)
HEMOGLOBIN: 15.1 g/dL (ref 13.0–17.0)
MCHC: 33.4 g/dL (ref 30.0–36.0)
MCV: 81.1 fl (ref 78.0–100.0)
PLATELETS: 180 10*3/uL (ref 150.0–400.0)
RBC: 5.57 Mil/uL (ref 4.22–5.81)
RDW: 13.5 % (ref 11.5–15.5)
WBC: 7.6 10*3/uL (ref 4.0–10.5)

## 2016-06-11 LAB — VITAMIN B12: Vitamin B-12: 278 pg/mL (ref 211–911)

## 2016-06-11 LAB — T4, FREE: Free T4: 0.87 ng/dL (ref 0.60–1.60)

## 2016-06-11 LAB — TSH: TSH: 0.67 u[IU]/mL (ref 0.35–4.50)

## 2016-06-11 NOTE — Assessment & Plan Note (Signed)
BP is too low today. Stop amlodipine 10 mg daily. Continue bystolic 10 mg daily. If still having symptoms can try further decrease if blood pressure allows.

## 2016-06-11 NOTE — Assessment & Plan Note (Signed)
Checking labs today and adjust as needed. Checking thyroid, B12, CBC, hep c antibody. Colonoscopy up to date and recent PSA normal, CT chest without malignancy.

## 2016-06-11 NOTE — Progress Notes (Signed)
   Subjective:    Patient ID: Derek Harrell, male    DOB: 1946-03-05, 70 y.o.   MRN: [id number]  HPI The patient is a 70 YO man coming in for weight loss in the last several months. He is tired a lot of the time and felt like he might pass out recently. He is having some low blood pressures and wonders if this could be related to his weight change. Still taking amlodipine and bystolic for blood pressure. Eating well and sleeping well. No new symptoms. CT chest negative for cancerous changes in June. Colonoscopy 2016 normal. Recent labs from September without cause (reviewed with him at visit). His neurologist is tapering him off sinemet as it has not helped much.   HgA1c not diabetic, PSA 2 (negative per their range)  Review of Systems  Constitutional: Positive for activity change and fatigue. Negative for appetite change, fever and unexpected weight change.  Respiratory: Negative.   Cardiovascular: Negative.   Gastrointestinal: Negative.   Musculoskeletal: Negative.   Neurological: Positive for weakness and light-headedness. Negative for dizziness and facial asymmetry.       Slow gait and stiffness      Objective:   Physical Exam  Constitutional: He is oriented to person, place, and time. He appears well-developed and well-nourished.  HENT:  Head: Normocephalic and atraumatic.  Eyes: EOM are normal.  Neck: Normal range of motion.  Cardiovascular: Normal rate and regular rhythm.   Pulmonary/Chest: Effort normal and breath sounds normal. No respiratory distress. He has no wheezes. He has no rales.  Abdominal: Soft. He exhibits no distension. There is no tenderness. There is no rebound.  Musculoskeletal: He exhibits no edema.  Neurological: He is alert and oriented to person, place, and time.  Skin: Skin is warm and dry.   Vitals:   06/11/16 0932  BP: 110/64  Pulse: 66  Resp: 12  Temp: 99 F (37.2 C)  TempSrc: Oral  SpO2: 98%  Weight: 187 lb (84.8 kg)  Height: 6\' 1"  (1.854 m)     Assessment & Plan:  Flu shot given at visit.

## 2016-06-11 NOTE — Patient Instructions (Signed)
We will have you stop taking the amlodipine.   We are checking the labs today and will call you back with the results.   We will continue to watch the weights and the energy levels.   You can get the shingles shot in about 1 month if you want to.

## 2016-06-11 NOTE — Progress Notes (Signed)
Pre visit review using our clinic review tool, if applicable. No additional management support is needed unless otherwise documented below in the visit note. 

## 2016-06-12 LAB — HEPATITIS C ANTIBODY: HCV AB: NEGATIVE

## 2016-06-16 ENCOUNTER — Telehealth: Payer: Self-pay | Admitting: Neurology

## 2016-06-16 DIAGNOSIS — G20 Parkinson's disease: Secondary | ICD-10-CM

## 2016-06-16 NOTE — Telephone Encounter (Signed)
Patient is calling to get a referral to Dr. Hillery Hunter at [facility]. He says he is ready to go see him.

## 2016-06-16 NOTE — Telephone Encounter (Signed)
I called patient. The patient wants to get a second opinion through [facility], I will get this set up.

## 2016-06-16 NOTE — Addendum Note (Signed)
Addended by: Margette Fast on: 06/16/2016 06:02 PM   Modules accepted: Orders

## 2016-07-11 ENCOUNTER — Other Ambulatory Visit: Payer: Self-pay | Admitting: Cardiovascular Disease

## 2016-07-11 ENCOUNTER — Ambulatory Visit (HOSPITAL_COMMUNITY)
Admission: EM | Admit: 2016-07-11 | Discharge: 2016-07-11 | Disposition: A | Discharge status: Home / Self Care | Payer: Medicare Other | Attending: Family Medicine | Admitting: Family Medicine

## 2016-07-11 ENCOUNTER — Ambulatory Visit: Payer: Medicare Other

## 2016-07-11 ENCOUNTER — Encounter (HOSPITAL_COMMUNITY): Payer: Self-pay | Admitting: *Deleted

## 2016-07-11 DIAGNOSIS — B9789 Other viral agents as the cause of diseases classified elsewhere: Secondary | ICD-10-CM

## 2016-07-11 DIAGNOSIS — J069 Acute upper respiratory infection, unspecified: Secondary | ICD-10-CM

## 2016-07-11 DIAGNOSIS — I714 Abdominal aortic aneurysm, without rupture, unspecified: Secondary | ICD-10-CM

## 2016-07-11 MED ORDER — IPRATROPIUM BROMIDE 0.06 % NA SOLN: 2.0000 | Freq: Four times a day (QID) | NASAL | 0 refills | Status: DC

## 2016-07-11 NOTE — Discharge Instructions (Signed)
Your symptoms are consistent with a viral upper respiratory infection. Your body does a great job fighting off viruses. There are no medications to fight viruses. You can use Atrovent nasal spray for your congestion for the next few days. Your cough may worsen over the next few days. Cough can linger for up to 3 weeks. You can try tea with honey for your cough. If you have difficulty breathing please seek immediate medical care.

## 2016-07-11 NOTE — ED Triage Notes (Signed)
Pt   Started  To  Become  Hoarse      Nasal  stuffyness            Sneezing     Slight  Cough            Symptoms  Started  yest
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
## 2016-07-16 ENCOUNTER — Ambulatory Visit (HOSPITAL_COMMUNITY)
Admission: EM | Admit: 2016-07-16 | Discharge: 2016-07-16 | Disposition: A | Discharge status: Home / Self Care | Payer: Medicare Other | Attending: Family Medicine | Admitting: Family Medicine

## 2016-07-16 ENCOUNTER — Encounter (HOSPITAL_COMMUNITY): Payer: Self-pay | Admitting: Emergency Medicine

## 2016-07-16 DIAGNOSIS — J40 Bronchitis, not specified as acute or chronic: Secondary | ICD-10-CM | POA: Diagnosis not present

## 2016-07-16 MED ORDER — HYDROCODONE-HOMATROPINE 5-1.5 MG/5ML PO SYRP: 5.0000 mL | ORAL_SOLUTION | Freq: Four times a day (QID) | ORAL | 0 refills | Status: DC | PRN

## 2016-07-16 MED ORDER — AZITHROMYCIN 250 MG PO TABS: 250.0000 mg | ORAL_TABLET | Freq: Every day | ORAL | 0 refills | Status: DC

## 2016-07-16 NOTE — ED Triage Notes (Signed)
The patient presented to the [facility] with his family with a complaint of a cough that has been productive for 4 days. The patient reported that he was evaluated on 07/11/2016 and prescribed nasal spray. The patient reported that the cough has gotten worse.
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
## 2016-07-17 ENCOUNTER — Other Ambulatory Visit (HOSPITAL_COMMUNITY): Payer: Medicare Other

## 2016-07-18 ENCOUNTER — Other Ambulatory Visit: Payer: Self-pay | Admitting: Internal Medicine

## 2016-07-27 ENCOUNTER — Other Ambulatory Visit: Payer: Self-pay | Admitting: Internal Medicine

## 2016-08-14 ENCOUNTER — Encounter (INDEPENDENT_AMBULATORY_CARE_PROVIDER_SITE_OTHER): Payer: Self-pay

## 2016-08-14 ENCOUNTER — Ambulatory Visit (HOSPITAL_COMMUNITY)
Admission: RE | Admit: 2016-08-14 | Discharge: 2016-08-14 | Disposition: A | Discharge status: Home / Self Care | Payer: Medicare Other | Source: Ambulatory Visit | Attending: Cardiology | Admitting: Cardiology

## 2016-08-14 DIAGNOSIS — Z87891 Personal history of nicotine dependence: Secondary | ICD-10-CM | POA: Insufficient documentation

## 2016-08-14 DIAGNOSIS — I10 Essential (primary) hypertension: Secondary | ICD-10-CM | POA: Diagnosis not present

## 2016-08-14 DIAGNOSIS — I714 Abdominal aortic aneurysm, without rupture, unspecified: Secondary | ICD-10-CM

## 2016-08-14 DIAGNOSIS — E785 Hyperlipidemia, unspecified: Secondary | ICD-10-CM | POA: Diagnosis not present

## 2016-08-14 DIAGNOSIS — I708 Atherosclerosis of other arteries: Secondary | ICD-10-CM | POA: Diagnosis not present

## 2016-08-19 ENCOUNTER — Telehealth: Payer: Self-pay | Admitting: Cardiovascular Disease

## 2016-08-19 NOTE — Telephone Encounter (Signed)
Pt would like his doppler results from 08-14-16 please.

## 2016-08-19 NOTE — Telephone Encounter (Signed)
Returned patient call - Goes directly to VM - per DPR OK to leave detailed msg on home machine. Have done so w results and recommendation to call if questions.

## 2016-08-25 ENCOUNTER — Telehealth: Payer: Self-pay | Admitting: Emergency Medicine

## 2016-08-25 DIAGNOSIS — G20 Parkinson's disease: Secondary | ICD-10-CM

## 2016-08-25 NOTE — Telephone Encounter (Signed)
Patient contacted and he did not go to [facility] for confirmation. Also stated he called insurance about shingles shot and knows the cost and was informed he can schedule visit to receive it.

## 2016-08-25 NOTE — Telephone Encounter (Signed)
Pt called and wants to know if he can get a referral to [facility] for Parkinson's Disease. He is also interested in getting the shingles shot and wants to know when he should do that. Please advise thanks.

## 2016-08-25 NOTE — Telephone Encounter (Signed)
Patient contacted and stated awareness 

## 2016-08-25 NOTE — Telephone Encounter (Signed)
His neurologist has already referred him to [facility] for confirmation of this diagnosis. Did he follow through with this? The shingles shot he can get but we recommend that people check with their insurance to see if it is covered or how much his copay would be.

## 2016-08-25 NOTE — Telephone Encounter (Signed)
Referral placed.

## 2016-08-28 ENCOUNTER — Ambulatory Visit (INDEPENDENT_AMBULATORY_CARE_PROVIDER_SITE_OTHER): Payer: Medicare Other | Admitting: General Practice

## 2016-08-28 DIAGNOSIS — Z2911 Encounter for prophylactic immunotherapy for respiratory syncytial virus (RSV): Secondary | ICD-10-CM

## 2016-08-28 DIAGNOSIS — Z23 Encounter for immunization: Secondary | ICD-10-CM | POA: Diagnosis not present

## 2016-08-28 NOTE — Progress Notes (Signed)
Medical treatment/procedure(s) were performed by non-physician practitioner and as supervising physician I was immediately available for consultation/collaboration. I agree with above. Elizabeth A Crawford, MD  

## 2016-09-11 ENCOUNTER — Encounter: Payer: Self-pay | Admitting: Internal Medicine

## 2016-09-11 ENCOUNTER — Ambulatory Visit (INDEPENDENT_AMBULATORY_CARE_PROVIDER_SITE_OTHER): Payer: Medicare Other | Admitting: Internal Medicine

## 2016-09-11 VITALS — BP 142/72 | HR 68 | Temp 98.0°F | Ht 73.0 in | Wt 192.0 lb

## 2016-09-11 DIAGNOSIS — H9193 Unspecified hearing loss, bilateral: Secondary | ICD-10-CM | POA: Insufficient documentation

## 2016-09-11 DIAGNOSIS — I10 Essential (primary) hypertension: Secondary | ICD-10-CM | POA: Diagnosis not present

## 2016-09-11 NOTE — Progress Notes (Signed)
Pre visit review using our clinic review tool, if applicable. No additional management support is needed unless otherwise documented below in the visit note. 

## 2016-09-11 NOTE — Assessment & Plan Note (Signed)
Referral to audiology for testing and possible hearing aid(s) if needed.

## 2016-09-11 NOTE — Progress Notes (Signed)
   Subjective:    Patient ID: Derek Harrell, male    DOB: 06/18/1946, 71 y.o.   MRN: [id number]  HPI The patient is a 71 YO man coming in for follow up of his weakness. He has stopped taking amlodipine since last visit. This has given him more energy and slightly more appetite. He is concerned about his hearing as most of his friends say that his hearing is poor. He is having some problems with hearing people in groups as well as tv and radio. He is going to see Dr. Carles Collet in the near future for his parkinson's to see if they can come up with a better treatment for this. No other new concerns.   Review of Systems  Constitutional: Positive for appetite change and fatigue. Negative for activity change, chills, diaphoresis, fever and unexpected weight change.  Respiratory: Negative.   Cardiovascular: Negative.   Gastrointestinal: Negative.   Musculoskeletal: Positive for gait problem. Negative for arthralgias, back pain, myalgias, neck pain and neck stiffness.  Skin: Negative.   Neurological: Positive for weakness. Negative for dizziness, facial asymmetry, light-headedness and headaches.      Objective:   Physical Exam  Constitutional: He is oriented to person, place, and time. He appears well-developed and well-nourished.  HENT:  Head: Normocephalic and atraumatic.  Right Ear: External ear normal.  Left Ear: External ear normal.  Some decrease in hearing  Eyes: EOM are normal.  Neck: Normal range of motion.  Cardiovascular: Normal rate and regular rhythm.   Pulmonary/Chest: Effort normal and breath sounds normal. He has no wheezes. He has no rales.  Abdominal: Soft. He exhibits no distension. There is no tenderness.  Neurological: He is alert and oriented to person, place, and time.  Skin: Skin is warm and dry.   Vitals:   09/11/16 0931  BP: (!) 150/76  Pulse: 68  Temp: 98 F (36.7 C)  TempSrc: Oral  SpO2: 98%  Weight: 192 lb (87.1 kg)  Height: 6\' 1"  (1.854 m)      Assessment &  Plan:

## 2016-09-11 NOTE — Patient Instructions (Addendum)
We will get you in for the hearing test.   The medicine for the prostate that we are thinking about is called finasteride (also called proscar).

## 2016-09-11 NOTE — Assessment & Plan Note (Signed)
BP still at goal with just bystolic 10 mg daily. He is no longer having orthostatic symptoms at home which will decrease his fall risk.

## 2016-09-13 ENCOUNTER — Ambulatory Visit (INDEPENDENT_AMBULATORY_CARE_PROVIDER_SITE_OTHER): Payer: Medicare Other | Admitting: Family Medicine

## 2016-09-13 ENCOUNTER — Encounter: Payer: Self-pay | Admitting: Family Medicine

## 2016-09-13 DIAGNOSIS — D1722 Benign lipomatous neoplasm of skin and subcutaneous tissue of left arm: Secondary | ICD-10-CM

## 2016-09-13 DIAGNOSIS — D179 Benign lipomatous neoplasm, unspecified: Secondary | ICD-10-CM | POA: Insufficient documentation

## 2016-09-13 NOTE — Progress Notes (Signed)
Lump on L arm.  Unknown duration, only noted today.  Not painful.  No pain with ROM.  No FCNAVD.  No drainage locally from the lesion  No bruising.  No trauma.    Meds, vitals, and allergies reviewed.   ROS: Per HPI unless specifically indicated in ROS section   nad ncat Normal ROM L shoulder and L elbow Triceps and biceps not ttp, no popeye deformity.  Soft not ttp mass over the L triceps, superficial to the muscles.

## 2016-09-13 NOTE — Progress Notes (Signed)
Pre visit review using our clinic review tool, if applicable. No additional management support is needed unless otherwise documented below in the visit note. 

## 2016-09-13 NOTE — Patient Instructions (Addendum)
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   If it gets bigger or bothersome, your regular doc or a surgeon could take it out.  Take care.  Glad to see you.

## 2016-09-13 NOTE — Assessment & Plan Note (Signed)
Likely, d/w pt.  See AVS.  Wouldn't intervene.  Routed to PCP as FYI.  Pt agrees with plan.

## 2016-09-16 ENCOUNTER — Encounter: Payer: Self-pay | Admitting: Internal Medicine

## 2016-09-16 ENCOUNTER — Ambulatory Visit (INDEPENDENT_AMBULATORY_CARE_PROVIDER_SITE_OTHER): Payer: Medicare Other | Admitting: Internal Medicine

## 2016-09-16 DIAGNOSIS — D1722 Benign lipomatous neoplasm of skin and subcutaneous tissue of left arm: Secondary | ICD-10-CM | POA: Diagnosis not present

## 2016-09-16 NOTE — Assessment & Plan Note (Signed)
Benign etiology and reassured during the visit. Since no growth or symptoms not indicated for surgical removal at this time.

## 2016-09-16 NOTE — Progress Notes (Signed)
   Subjective:    Patient ID: Landan Tayloe, male    DOB: [birth year], 71 y.o.   MRN: [id number]  HPI The patient is a 71 YO man coming in for lipoma on his left arm. [facility] and they told him it was not harmful. He is just worried about it and wants a second set of eyes on the area. It has not grown since he found it. No pain or rash on the skin.   Review of Systems  Constitutional: Negative.   Respiratory: Negative.   Cardiovascular: Negative.   Gastrointestinal: Negative.   Musculoskeletal: Negative.   Skin:       lipoma      Objective:   Physical Exam  Constitutional: He is oriented to person, place, and time. He appears well-developed and well-nourished.  HENT:  Head: Normocephalic and atraumatic.  Eyes: EOM are normal.  Cardiovascular: Normal rate and regular rhythm.   Pulmonary/Chest: Effort normal.  Abdominal: Soft.  Musculoskeletal: He exhibits no tenderness.  Small lipoma on the left upper extremity.   Neurological: He is alert and oriented to person, place, and time.  Skin: Skin is warm and dry. No rash noted. No erythema. No pallor.   Vitals:   09/16/16 1012  BP: (!) 144/90  Pulse: 74  Temp: 97.9 F (36.6 C)  TempSrc: Oral  SpO2: 99%  Weight: 184 lb (83.5 kg)  Height: 6\' 1"  (1.854 m)      Assessment & Plan:

## 2016-09-16 NOTE — Progress Notes (Signed)
Pre visit review using our clinic review tool, if applicable. No additional management support is needed unless otherwise documented below in the visit note. 
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
## 2016-09-22 ENCOUNTER — Encounter: Payer: Self-pay | Admitting: Neurology

## 2016-09-22 ENCOUNTER — Ambulatory Visit (INDEPENDENT_AMBULATORY_CARE_PROVIDER_SITE_OTHER): Payer: Medicare Other | Admitting: Neurology

## 2016-09-22 VITALS — BP 140/90 | HR 70 | Ht 73.0 in | Wt 190.0 lb

## 2016-09-22 DIAGNOSIS — G7000 Myasthenia gravis without (acute) exacerbation: Secondary | ICD-10-CM

## 2016-09-22 DIAGNOSIS — R634 Abnormal weight loss: Secondary | ICD-10-CM | POA: Diagnosis not present

## 2016-09-22 DIAGNOSIS — G20 Parkinson's disease: Secondary | ICD-10-CM | POA: Diagnosis not present

## 2016-09-22 NOTE — Patient Instructions (Signed)
1. We have scheduled you for a Levodopa Challenge test on 11/05/16. Do not take any Carbidopa Levodopa on 11/04/16 or 11/05/16. Your appt is scheduled for 8:15 am.

## 2016-09-30 ENCOUNTER — Ambulatory Visit (INDEPENDENT_AMBULATORY_CARE_PROVIDER_SITE_OTHER): Payer: Medicare Other | Admitting: Neurology

## 2016-09-30 ENCOUNTER — Telehealth: Payer: Self-pay | Admitting: Neurology

## 2016-09-30 DIAGNOSIS — G7000 Myasthenia gravis without (acute) exacerbation: Secondary | ICD-10-CM

## 2016-09-30 NOTE — Telephone Encounter (Signed)
Left message on machine for patient to call back.

## 2016-09-30 NOTE — Procedures (Signed)
[facility]  [address], [address]  [place], [place] [address] Tel: [phone] Fax:  [phone] Test Date:  09/30/2016  Patient: Derek Harrell DOB: [birth year] Physician: Narda Amber, DO  Sex: Male Height: 6\' 1"  Ref Phys: Alonza Bogus, D.O.  ID#: [id number] Temp: 33.6C Technician: Jerilynn Mages. Dean   Patient Complaints: This is a 71 year old gentleman referred for evaluation of diplopia and a long history of ptosis.   NCV & EMG Findings: Extensive electrodiagnostic testing of the right upper and lower extremity shows:  1. Right median sensory response is within normal limits. Right sural sensory response is absent.  2. Right median motor responses within normal limits. Right peroneal motor response shows reduced amplitude at the extensor digitorum brevis, normal amplitude at the tibialis anterior. 3. Repetitive nerve stimulation of the spinal accessory nerve and recording at the trapezius shows evidence of decrement. Repetitive nerve stimulation of the median nerve recording at the abductor pollicis brevis is within normal limits. 4. Chronic motor axon loss changes are seen affecting the anterior tibialis and medial gastrocnemius muscles, without accompanied active denervation.  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   ___________________________ Narda Amber, DO    Nerve Conduction Studies Anti Sensory Summary Table   Site NR Peak (ms) Norm Peak (ms) P-T Amp (V) Norm P-T Amp  Right Median Anti Sensory (2nd Digit)  33.6C  Wrist    2.8 <3.8 16.0 >10  Right Sural Anti Sensory (Lat Mall)  33.6C  Calf NR  <4.6  >3   Motor Summary Table   Site NR Onset (ms) Norm Onset (ms) O-P Amp (mV) Norm O-P Amp Site1 Site2 Delta-0 (ms) Dist (cm) Vel (m/s) Norm Vel (m/s)  Right Median Motor (Abd Poll Brev)  33.6C  Wrist     3.9 <4.0 5.6 >5 Elbow Wrist 5.2 27.0 52 >50  Elbow    9.1  4.9         Right Peroneal Motor (Ext Dig Brev)  33.6C  Ankle    3.8 <6.0 0.3 >2.5 B Fib Ankle 7.8 34.0 44 >40  B Fib    11.6  0.2  Poplt B Fib 2.2 10.0 45 >40  Poplt    13.8  0.2         Right Peroneal TA Motor (Tib Ant)  33.6C  Fib Head    2.6 <4.5 3.3 >3 Poplit Fib Head 1.6 10.0 63 >40  Poplit    4.2  3.3          EMG   Side Muscle Ins Act Fibs Psw Fasc Number Recrt Dur Dur. Amp Amp. Poly Poly. Comment  Right AntTibialis Nml Nml Nml Nml 1- Rapid Some 1+ Some 1+ Nml Nml N/A  Right Gastroc Nml Nml Nml Nml 2- Rapid Some 1+ Some 1+ Nml Nml N/A  Right RectFemoris Nml Nml Nml Nml Nml Nml Nml Nml Nml Nml Nml Nml N/A  Right BicepsFemS Nml Nml Nml Nml Nml Nml Nml Nml Nml Nml Nml Nml N/A  Right GluteusMed Nml Nml Nml Nml Nml Nml Nml Nml Nml Nml Nml Nml N/A  Right 1stDorInt Nml Nml Nml Nml Nml Nml Nml Nml Nml Nml Nml Nml N/A  Right PronatorTeres Nml Nml Nml Nml Nml Nml Nml Nml Nml Nml Nml Nml N/A  Right Biceps Nml Nml Nml Nml Nml Nml Nml Nml Nml Nml Nml Nml N/A  Right Triceps  Nml Nml Nml Nml Nml Nml Nml Nml Nml Nml Nml Nml N/A  Right Deltoid Nml Nml Nml Nml Nml Nml Nml Nml Nml Nml Nml Nml N/A   RNS   Trial # Label Amp 1 (mV)  O-P Amp 5 (mV)  O-P Amp % Dif Area 1 (mVms) Area 5 (mVms) Area % Dif Rep Rate Train Length Pause Time (min:sec) Comments  Right Abd Poll Brev - Run #1  Tr 1 Baseline 6.21 6.32 1.8 18.38 16.92 -7.9 3.00 10 00:30   Tr 2 Post Exercise 6.77 6.08 -10.2 17.04 17.43 2.3 3.00 10 01:00   Tr 3 1 Min Post 5.91 5.66 -4.2 18.47 16.52 -10.6 3.00 10 01:00   Tr 4 2 Min Post 5.30 5.38 1.4 16.66 15.09 -9.4 3.00 10 01:00   Tr 5 3 Min Post 5.30 5.82 10.0 16.93 16.32 -3.6 3.00 10 00:00   Right Trapezius  Tr 1 Baseline 2.51 2.66 5.7 17.02 16.90 -0.7 3.00 10 00:30   Tr 2 Post Exercise 2.70 2.73 1.4 19.00 17.34 -8.7 3.00 10 01:00   Tr 3 1 Min Post 2.56 2.49 -2.8 17.08 15.00 -12.2 3.00 10 01:00   Tr 4 2 Min Post 2.45 2.29 -6.3  16.07 13.44 -16.3 3.00 10 01:00   Tr 5 3 Min Post 2.73 2.55 -6.7 18.55 15.45 -16.7 3.00 10 00:00           Waveforms:

## 2016-09-30 NOTE — Telephone Encounter (Signed)
-----   Message from Pine, DO sent at 09/30/2016  1:35 PM EST ----- Tell pt that EMG confirmed dx of Myasthenia Gravis.  He may benefit from a consult with Dr. Posey Pronto.  He and I discussed that previously.  See if he is agreeable.  If so, make new pt appt with Dr. Posey Pronto (have discussed with Dr. Posey Pronto)

## 2016-10-01 ENCOUNTER — Other Ambulatory Visit: Payer: Self-pay | Admitting: Neurology

## 2016-10-01 NOTE — Telephone Encounter (Signed)
Called and LVM for pt to call. Received refill request for sinemet from pharmacy.  Looks like he saw Dr Tat as new patient on 09/22/16 at [facility]. Need to know if he is following with them now.  I do see he was referred by CW,MD to Eldridge Abrahams, MD at [facility] on 06/17/16 as a request for second opinion.  He does have f/u with CW,MD 12/08/16. Need to know if he is planning on following with CW,MD or if he is transferring care.

## 2016-10-01 NOTE — Telephone Encounter (Signed)
Tried to call patient again with no answer 

## 2016-10-02 ENCOUNTER — Telehealth: Payer: Self-pay | Admitting: Neurology

## 2016-10-02 NOTE — Telephone Encounter (Signed)
Tried to call patient again with no answer 

## 2016-10-02 NOTE — Telephone Encounter (Signed)
Derek Harrell 03/20/1946. His wife Izora Gala called to check on results from his test on Tuesday 2/28. Her cell # is [phone]. Thank you

## 2016-10-02 NOTE — Telephone Encounter (Signed)
Patient's wife made aware of results. New patient appt scheduled with Dr. Posey Pronto.

## 2016-10-03 ENCOUNTER — Telehealth: Payer: Self-pay | Admitting: Neurology

## 2016-10-03 NOTE — Telephone Encounter (Signed)
Spoke with patient - he really only wanted to know if he could see Dr. Posey Pronto earlier.  Made him aware that is the soonest appt she has. He will keep appt.

## 2016-10-03 NOTE — Telephone Encounter (Signed)
Derek Harrell 06/04/1946. He would like you to call him [phone]) regarding his results. I did make him aware you had already spoken to his wife. He would like to see what you think about the results. Thank you

## 2016-10-07 NOTE — Telephone Encounter (Signed)
Called patient again. He stated he is now going to follow with Dr Tat at [facility]. He requested appt for 12/08/16 be cx with CW,MD. Advised I will cx rx request and he will contact Dr Tat office. Advised him to call if he has any further questions or concerns. He verbalized understanding.

## 2016-10-08 ENCOUNTER — Telehealth: Payer: Self-pay | Admitting: Neurology

## 2016-10-08 NOTE — Telephone Encounter (Signed)
Pt called and needs his Sinemet CR filled.  He only has enough for 3 days.  Dr. [name] will not refill since he is seeing our practice now. The pharm listed is correct.

## 2016-10-08 NOTE — Telephone Encounter (Signed)
LMOM making patient aware that Dr. [name] had sent enough medication to last a year from July 2017.  To call back if any problems.

## 2016-10-30 ENCOUNTER — Telehealth: Payer: Self-pay | Admitting: Neurology

## 2016-10-30 NOTE — Telephone Encounter (Signed)
Spoke with patient's wife and confirmed he only needs to hold Levodopa and no other medications.

## 2016-10-30 NOTE — Telephone Encounter (Signed)
They have some questions about the on/off test please call [name]
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
## 2016-11-05 ENCOUNTER — Ambulatory Visit (INDEPENDENT_AMBULATORY_CARE_PROVIDER_SITE_OTHER): Payer: Medicare Other | Admitting: Neurology

## 2016-11-05 ENCOUNTER — Encounter: Payer: Self-pay | Admitting: Neurology

## 2016-11-05 VITALS — BP 140/80 | HR 60

## 2016-11-05 DIAGNOSIS — G7000 Myasthenia gravis without (acute) exacerbation: Secondary | ICD-10-CM

## 2016-11-05 DIAGNOSIS — G20 Parkinson's disease: Secondary | ICD-10-CM

## 2016-11-05 MED ORDER — CARBIDOPA-LEVODOPA 25-100 MG PO TABS
3.0000 | ORAL_TABLET | Freq: Once | ORAL | Status: AC
  Administered 2016-11-05: 3 via ORAL

## 2016-11-05 MED ORDER — CARBIDOPA-LEVODOPA 25-100 MG PO TABS: 2.0000 | ORAL_TABLET | Freq: Three times a day (TID) | ORAL | 3 refills | Status: DC

## 2016-11-05 NOTE — Patient Instructions (Signed)
1. Change Carbidopa Levodopa to the following:  Carbidopa Levodopa 25/100 IR: 2 tablets at 7 am. 2 tablets at 11 am. 2 tablets at 4 pm.  Continue Carbidopa Levodopa 50/200 CR: 1 tablet at bedtime (10 pm).

## 2016-11-11 ENCOUNTER — Ambulatory Visit: Payer: Medicare Other | Attending: Internal Medicine | Admitting: Audiology

## 2016-11-11 DIAGNOSIS — Z01118 Encounter for examination of ears and hearing with other abnormal findings: Secondary | ICD-10-CM | POA: Insufficient documentation

## 2016-11-11 DIAGNOSIS — H903 Sensorineural hearing loss, bilateral: Secondary | ICD-10-CM | POA: Diagnosis present

## 2016-11-11 DIAGNOSIS — R94128 Abnormal results of other function studies of ear and other special senses: Secondary | ICD-10-CM | POA: Diagnosis present

## 2016-11-11 DIAGNOSIS — H9193 Unspecified hearing loss, bilateral: Secondary | ICD-10-CM

## 2016-11-11 DIAGNOSIS — H93299 Other abnormal auditory perceptions, unspecified ear: Secondary | ICD-10-CM | POA: Insufficient documentation

## 2016-11-11 NOTE — Procedures (Signed)
[facility]  [place], [place] [address]  [phone]   Audiological Evaluation  Patient Name: Derek Harrell    Status: Outpatient   DOB: 1945/09/29    Diagnosis: Bilateral Hearing Loss                 MRN: [id number] Date:  11/11/2016     Referent: Derek Koch, MD  History: Derek Harrell was seen for an audiological evaluation. Accompanied by: Derek Harrell Primary Concern: Trouble hearing family. Pain: None History of hearing problems: Y  History of ear infections:   N History of dizziness/vertigo:  N History of balance issues:  Y - has parkinson's disease - getting physical therapy once a week. Tinnitus: N Sound sensitivity: N History of occupational noise exposure: Worked a [employer] company for 39 years - wore hearing and eye protection. Retired in 2010 History of hypertension: N History of diabetes:  N Family history of hearing loss:  Brother got hearing aids when older. Other concerns: Had "stroke when age [age]" - parkinson's identified then.  Retired age 66.    Evaluation: Conventional pure tone audiometry from _0  - _1  with using insert earphones.  Hearing Thresholds are symmetrical from 20-25 dBHL from _2  - _3 ; 35-40 dBHL at _4  and 50-60 dBHL form _5  - _6  bilaterally. Reliability is good Speech reception levels (repeating words near threshold) using recorded spondee word lists:  Right ear: 30 dBHL.  Left ear:  30 dBHL Word recognition (at comfortably loud volumes) using recorded word lists at 65 dBHL, in quiet.  Right ear: 85%.  Left ear:   100% Word recognition in minimal background noise:  +5 dBHL  Right ear: 54%                              Left ear:  36%  Tympanometry showed normal middle ear volume, pressure and compliance (Type A) with present ipsilateral acoustic reflexes from _7  - _8  bilaterally..   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  This amount of hearing loss will adversely affect speech communication at normal conversational speech levels. Word recognition is good on the right and excellent on the left  in quiet at loud conversational speech levels bilaterally. In minimal background noise, word recognition drops to poor in each ear, poorer on the left side.   Derek Harrell may be an excellent candidate for amplification; therefore a hearing aid evaluation is recommended. Amplification helps make the signal louder and therefore often improves hearing and word recognition.  Amplification has many forms including hearing aids in one or both ears, an assistive listening device which have a microphone and speaker such as a small handheld device and/or even a surround sound system of speakers.  Amplification may be covered by some insurances, but not all.  It is important to note that hearing aids must be individually fit according to the hearing test results and the ear shape.  Audiologists and hearing aid dealers in [place] must be licensed in order to dispense hearing aids.  In addition, a trial period is mandated by law in our state because often amplification must be tried and then evaluated in order to determine benefit.  There are many excellent choices when it comes to amplification in our area and providers are listed in the phone book under hearing  aids, may be affiliated with Ear, Nose and Throat physicians, are located at [facility] and [employer]. The test results were discussed and Derek Harrell counseled.   RECOMMENDATIONS: 1.  Contact your insurance company about hearing aid benefit.   2.  [employer] in [place] may help with obtaining one hearing aid if hearing loss and financial qualifications are met.  Please contact Derek Harrell at [phone] or Call      [facility] for details at [phone].  3.  Monitor  hearing closely with a repeat audiological evaluation in 6 months (earlier if there is any change in hearing or ear pressure) to monitor a) poorer word recognition in quiet on the right side b) word recognition in minimal background noise c) hearing thresholds.  This evaluation may be completed here or in conjunction with hearing aid fitting/monitoring.    4.  Strategies that help improve hearing include: A) Face the speaker directly. Optimal is having the speakers face well - lit.  Unless amplified, being within 3-6 feet of the speaker will enhance word recognition. B) Avoid having the speaker back-lit as this will minimize the ability to use cues from lip-reading, facial expression and gestures. C)  Word recognition is poorer in background noise. For optimal word recognition, turn off the TV, radio or noisy fan when engaging in conversation. In a restaurant, try to sit away from noise sources and close to the primary speaker.        D)  Ask for topic clarification from time to time in order to remain in the conversation.  Most people don't mind repeating or clarifying a point when asked.  If needed, explain the difficulty hearing in background noise or hearing loss.  Derek Harrell L. Derek Harrell, Au.D., CCC-A Doctor of Audiology 11/11/2016

## 2016-11-17 ENCOUNTER — Encounter: Payer: Self-pay | Admitting: Neurology

## 2016-11-17 ENCOUNTER — Ambulatory Visit (INDEPENDENT_AMBULATORY_CARE_PROVIDER_SITE_OTHER): Payer: Medicare Other | Admitting: Neurology

## 2016-11-17 VITALS — BP 130/88 | HR 67 | Ht 72.0 in | Wt 192.2 lb

## 2016-11-17 DIAGNOSIS — G7000 Myasthenia gravis without (acute) exacerbation: Secondary | ICD-10-CM | POA: Diagnosis not present

## 2016-11-17 MED ORDER — PYRIDOSTIGMINE BROMIDE 60 MG PO TABS: ORAL_TABLET | ORAL | 3 refills | Status: DC

## 2016-11-17 NOTE — Progress Notes (Signed)
Follow-up Visit   Date: 11/17/16    Derek Harrell MRN: [id number] DOB: 1945-08-26   Interim History: Derek Harrell is a 71 y.o. right-handed African American male with parkinsonism, hyperlipidemia, stroke, and hypertension returning to the clinic for second opinion on myasthenia gravis.  The patient was accompanied to the clinic by wife.  He referred for my opinion myasthenia gravis by Dr. Carles Collet, whom patient sees for parkinsonism.  He reports having droopy eyelids since birth, which is worse on the right.  These symptoms have remained constant without worsening in the evening or with fatigue.  He was doing well until 2017, when he started having spells of double vision.  He was being followed by Dr. [name] at [facility] who checked AChR antibodies which returned positive.  CT chest was negative for thymoma.  Because he was asympomatic from [facility], he was not started on any medications.  He was also being followed by parkinsonism and referred to Dr. Carles Collet for a second opinion who has referred the patient for my opinion on myasthenia. Currently, patient denies any double vision and no worsening of ptosis.   He denies any difficulty swallowing or talking. No shortness of breath.  He endorses generalized weakness, especially of the legs upon getting out of bed in the morning. He does not have arm weakness, such as reach for objects.  Of note, he is complaining of visual hallucinations which wife states occur regularly, but patient dismisses to be bothersome.  They have seen commercials for Nuplazid and wife was wondering if this could be helpful.     Medications:  Current Outpatient Prescriptions on File Prior to Visit  Medication Sig Dispense Refill  . AMITIZA 24 MCG capsule     . aspirin 81 MG EC tablet Take 1 tablet (81 mg total) by mouth daily. Swallow whole. 30 tablet 12  . carbidopa-levodopa (SINEMET CR) 50-200 MG tablet TAKE 1 TABLET BY MOUTH AT 8 AM, NOON, 4 PM, AND 8 PM 120 tablet 11  .  carbidopa-levodopa (SINEMET IR) 25-100 MG tablet Take 2 tablets by mouth 3 (three) times daily. 180 tablet 3  . clopidogrel (PLAVIX) 75 MG tablet Take 75 mg by mouth daily.    Marland Kitchen ipratropium (ATROVENT) 0.06 % nasal spray PLACE 2 SPRAYS IN EACH NOSTRIL FOUR TIMES DAILY FOR NASAL CONGESTION 15 mL 0  . nebivolol (BYSTOLIC) 10 MG tablet Take 10 mg by mouth daily.    . rosuvastatin (CRESTOR) 10 MG tablet Take 10 mg by mouth daily.     No current facility-administered medications on file prior to visit.     Allergies: No Known Allergies  Review of Systems:  CONSTITUTIONAL: No fevers, chills, night sweats, or weight loss.  EYES: No visual changes or eye pain ENT: No hearing changes.  No history of nose bleeds.   RESPIRATORY: No cough, wheezing and shortness of breath.   CARDIOVASCULAR: Negative for chest pain, and palpitations.   GI: Negative for abdominal discomfort, blood in stools or black stools.  No recent change in bowel habits.   GU:  No history of incontinence.   MUSCLOSKELETAL: No history of joint pain or swelling.  No myalgias.   SKIN: Negative for lesions, rash, and itching.   ENDOCRINE: Negative for cold or heat intolerance, polydipsia or goiter.   PSYCH:  No depression or anxiety symptoms.   NEURO: As Above.   Vital Signs:  BP 130/88   Pulse 67   Ht 6' (1.829 m)   Wt 192 lb  4 oz (87.2 kg)   SpO2 97%   BMI 26.07 kg/m    General: Well appearing, moderate bilateral ptosis Neck: No carotid bruit CV: RRR Pulm:  CTAB Ext: No edema  Neurological Exam: MENTAL STATUS including orientation to time, place, person, recent and remote memory, attention span and concentration, language, and fund of knowledge is normal.  Speech is not dysarthric.  CRANIAL NERVES: Normal fundoscopic exam.  No visual field defects.  Pupils equal round and reactive to light.  Normal conjugate, extra-ocular eye movements in all directions of gaze.  Moderate ptosis bilaterally, worse on the right where  eyelid overs 25% of the upper pupil. Normal facial sensation.  Face is symmetric. Facial muscles are intact - orbicularis oculi, buccinator, and orbicularis oris is 5/5. Palate elevates symmetrically.  Tongue is midline and strength is 5/5.  MOTOR:  Motor strength is 5/5 in all extremities, except bilateral hip flexion is 4+/5.  No atrophy, fasciculations or abnormal movements.  No pronator drift.  Tone is normal.    MSRs:  Reflexes are 3+/4 throughout, except 1+/4 Achilles bilaterally.  SENSORY:  Intact to temperature and pin prick.  COORDINATION/GAIT:  Normal finger-to- nose-finger.  It takes patient several repeated effort to stand from chair without pushing off.  Gait slightly wide-based, unsteady at times, unassisted.  Data: MRI brain wo contrast 09/09/2015: 1.  No acute intracranial abnormality. 2. 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 No progression identified since 2016.   NCS/EMG of the right side 09/30/2016: The electrophysiologic findings are of suggestive of postsynaptic neuromuscular junction disorder. 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  Labs 09/13/2015:  AChR binding 0.47*, TSH 1.08, ESR 2, ACE 34, ANA neg  IMPRESSION/PLAN 1.  Seropositive myasthenia gravis, initially manifesting with double vision (2017), now with bilateral hip flexion weakness.  His bilateral ptosis is longstanding and unlikely to be associated with MG as this has not changed.  Because of his leg weakness, I will offer a trial of mestinon 76m BID and titrate to 684mBID.  I do not wish to treat him more aggressively with corticosteroids as his symptoms are mild and risks of chronic steroid use may out-weight the benefit.  Certainly, if his weakness get worse or he develops bulbar symptoms, this can be reconsidered.  2.  Parkinson's disease, followed by Dr. 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 Will defer management of this to Dr. TaCarles Collet Return to clinic in 3 months   The duration of this appointment visit was 45 minutes of face-to-face time with the patient.  Greater than 50% of this time was spent in counseling, explanation of diagnosis, planning of further management, and coordination of care.   Thank you for allowing me to participate in patient's care.  If I can answer any additional questions, I would be pleased to do so.    Sincerely,    Lis Savitt K. PaPosey ProntoDO

## 2016-11-17 NOTE — Patient Instructions (Signed)
1.  Start mestinon 30mg  (half-tablet) at 8am and 4pm x 3 days, then increase to 1 tablet twice daily.   2.  Stay well hydrated 3.  Call my office in two weeks with an update  Return to clinic 3 months

## 2016-12-01 ENCOUNTER — Telehealth: Payer: Self-pay | Admitting: Neurology

## 2016-12-01 NOTE — Telephone Encounter (Signed)
Received a call from PT's wife regarding medication: Carbidopa-Levodopa.  Patient needs a refill of medication. No  Patient having side effects from medication. No  Patient calling to update [place] on medication. PT's wife said that he is having a hard time getting this prescription refilled and doesn't know if it is due to insurance issues

## 2016-12-01 NOTE — Telephone Encounter (Signed)
Left message on machine for patient to call back.  To see if they have contacted the pharmacy.

## 2016-12-02 NOTE — Telephone Encounter (Signed)
Spoke with patient's wife.  They will work this out with the pharmacy.

## 2016-12-08 ENCOUNTER — Ambulatory Visit: Payer: Medicare Other | Admitting: Neurology

## 2016-12-24 ENCOUNTER — Telehealth: Payer: Self-pay | Admitting: Neurology

## 2016-12-24 NOTE — Telephone Encounter (Signed)
[name] called and said that PT took an extra 2 of the Carbadopa this morning and wanted a call back

## 2016-12-24 NOTE — Telephone Encounter (Signed)
Patient's wife made aware that he may have some dyskinesia and nausea but taking extra Levodopa won't hurt him. He was instructed he could continue his regular dosages for the afternoon and evening.

## 2017-02-16 ENCOUNTER — Ambulatory Visit: Payer: Medicare Other | Admitting: Neurology

## 2017-02-16 ENCOUNTER — Telehealth: Payer: Self-pay | Admitting: Neurology

## 2017-02-16 MED ORDER — CARBIDOPA-LEVODOPA ER 50-200 MG PO TBCR: 1.0000 | EXTENDED_RELEASE_TABLET | Freq: Every day | ORAL | 1 refills | Status: DC

## 2017-02-16 MED ORDER — CARBIDOPA-LEVODOPA 25-100 MG PO TABS: 2.0000 | ORAL_TABLET | Freq: Three times a day (TID) | ORAL | 1 refills | Status: DC

## 2017-02-16 NOTE — Telephone Encounter (Signed)
Caller: Deidre Ala Zaborowski  (Wife)  Urgent? No  Reason for the call: Needing a refill on his Carbidopa Levodopa medication. Only has enough for this week. I believe she said she contacted the pharmacy as well? Thanks

## 2017-02-16 NOTE — Telephone Encounter (Signed)
RX sent to pharmacy  

## 2017-02-19 ENCOUNTER — Ambulatory Visit: Payer: Medicare Other | Admitting: Neurology
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
## 2017-03-10 ENCOUNTER — Encounter: Payer: Self-pay | Admitting: Neurology

## 2017-03-10 ENCOUNTER — Telehealth: Payer: Self-pay | Admitting: Neurology

## 2017-03-10 ENCOUNTER — Ambulatory Visit (INDEPENDENT_AMBULATORY_CARE_PROVIDER_SITE_OTHER): Payer: Medicare Other | Admitting: Neurology

## 2017-03-10 VITALS — BP 128/68 | HR 64 | Ht 73.0 in | Wt 194.0 lb

## 2017-03-10 DIAGNOSIS — R350 Frequency of micturition: Secondary | ICD-10-CM

## 2017-03-10 DIAGNOSIS — R351 Nocturia: Secondary | ICD-10-CM

## 2017-03-10 DIAGNOSIS — G7000 Myasthenia gravis without (acute) exacerbation: Secondary | ICD-10-CM

## 2017-03-10 DIAGNOSIS — G20 Parkinson's disease: Secondary | ICD-10-CM

## 2017-03-10 NOTE — Telephone Encounter (Signed)
Referral faxed to [facility] to [phone] with confirmation received. They will call patient with new patient appt.

## 2017-03-10 NOTE — Patient Instructions (Addendum)
Take carbidopa/levodopa 25/100, 2 tablets at 7am/10am/1pm/4pm.  continue the carbidopa/levodopa 50/200 at bedtime  We will send a referral to urology.  Make sure you tell the urologist you have parkinsons.  Great to see you!  Make a follow up in 4 months!

## 2017-03-10 NOTE — Addendum Note (Signed)
Addended byAnnamaria Helling on: 03/10/2017 10:47 AM   Modules accepted: Orders

## 2017-03-11 ENCOUNTER — Other Ambulatory Visit: Payer: Self-pay | Admitting: Neurology

## 2017-03-12 ENCOUNTER — Ambulatory Visit: Payer: Medicare Other | Admitting: Neurology

## 2017-03-14 ENCOUNTER — Other Ambulatory Visit: Payer: Self-pay | Admitting: Neurology

## 2017-03-16 ENCOUNTER — Other Ambulatory Visit: Payer: Self-pay | Admitting: *Deleted

## 2017-03-16 MED ORDER — PYRIDOSTIGMINE BROMIDE 60 MG PO TABS: ORAL_TABLET | ORAL | 3 refills | Status: DC

## 2017-03-20 ENCOUNTER — Ambulatory Visit (INDEPENDENT_AMBULATORY_CARE_PROVIDER_SITE_OTHER): Payer: Medicare Other | Admitting: Neurology

## 2017-03-20 ENCOUNTER — Encounter: Payer: Self-pay | Admitting: Neurology

## 2017-03-20 VITALS — BP 130/80 | HR 53 | Ht 73.0 in | Wt 193.1 lb

## 2017-03-20 DIAGNOSIS — G7000 Myasthenia gravis without (acute) exacerbation: Secondary | ICD-10-CM | POA: Diagnosis not present

## 2017-03-20 NOTE — Patient Instructions (Addendum)
Continue mestinon 60mg  twice daily.  You can take extra dose as needed during the [place].  Appt with [facility] scheduled for 04/13/17 at 1:00 pm with Dr. Lovena Neighbours.  If this is not a good date/time they can be reached back at [phone].  Return to clinic in 6 months

## 2017-03-20 NOTE — Progress Notes (Signed)
Follow-up Visit   Date: 03/20/17    Etheridge Geil MRN: [id number] DOB: 1946-01-29   Interim History: Derek Harrell is a 71 y.o. right-handed African American male with parkinsonism, hyperlipidemia, stroke, and hypertension here for follow-up of myasthenia gravis.  The patient was accompanied to the clinic by wife.    History of present illness: He reports having droopy eyelids since birth, which is worse on the right.  These symptoms have remained constant without worsening in the evening or with fatigue.  He was doing well until 2017, when he started having spells of double vision.  He was being followed by Dr. [name] at [facility] who checked AChR antibodies which returned positive.  CT chest was negative for thymoma.  Because he was asympomatic from [facility], he was not started on any medications.  He is  followed by parkinsonism and referred to Dr. Carles Collet for a second opinion who has referred the patient for my opinion on myasthenia.   He endorses generalized weakness, especially of the legs upon getting out of bed in the morning.  UPDATE 03/20/2017:  Here is here for follow-up of MG.  At his last visit, he was started on a trial of mestinon 67m BID and has noticed that his leg are stronger and buckling much less.  He does not notice double vision as much any more either, only when extremely tired.  He denies any difficulty swallowing or talking. No shortness of breath.  He denies arm weakness.  He has started to exercise 20-min twice daily with his wife and is not getting tired.    Medications:  Current Outpatient Prescriptions on File Prior to Visit  Medication Sig Dispense Refill  . AMITIZA 24 MCG capsule     . aspirin 81 MG EC tablet Take 1 tablet (81 mg total) by mouth daily. Swallow whole. 30 tablet 12  . carbidopa-levodopa (SINEMET CR) 50-200 MG tablet Take 1 tablet by mouth at bedtime. 90 tablet 1  . carbidopa-levodopa (SINEMET IR) 25-100 MG tablet Take 2 tablets by mouth 3 (three) times daily. 540  tablet 1  . clopidogrel (PLAVIX) 75 MG tablet Take 75 mg by mouth daily.    .Marland Kitchenipratropium (ATROVENT) 0.06 % nasal spray PLACE 2 SPRAYS IN EACH NOSTRIL FOUR TIMES DAILY FOR NASAL CONGESTION 15 mL 0  . nebivolol (BYSTOLIC) 10 MG tablet Take 10 mg by mouth daily.    .Marland Kitchenpyridostigmine (MESTINON) 60 MG tablet Take half-tab at 8am and 4pm x 3 days, then increase to 1 tablet at 8am and 4pm. 60 tablet 3  . rosuvastatin (CRESTOR) 10 MG tablet Take 10 mg by mouth daily.     No current facility-administered medications on file prior to visit.     Allergies: No Known Allergies  Review of Systems:  CONSTITUTIONAL: No fevers, chills, night sweats, or weight loss.  EYES: No visual changes or eye pain ENT: No hearing changes.  No history of nose bleeds.   RESPIRATORY: No cough, wheezing and shortness of breath.   CARDIOVASCULAR: Negative for chest pain, and palpitations.   GI: Negative for abdominal discomfort, blood in stools or black stools.  No recent change in bowel habits.   GU:  No history of incontinence.   MUSCLOSKELETAL: No history of joint pain or swelling.  No myalgias.   SKIN: Negative for lesions, rash, and itching.   ENDOCRINE: Negative for cold or heat intolerance, polydipsia or goiter.   PSYCH:  No depression or anxiety symptoms.   NEURO: As  Above.   Vital Signs:  BP 130/80   Pulse (!) 53   Ht _0  (1.854 m)   Wt 193 lb 2 oz (87.6 kg)   SpO2 96%   BMI 25.48 kg/m    Neurological Exam: MENTAL STATUS including orientation to time, place, person, recent and remote memory, attention span and concentration, language, and fund of knowledge is normal.  Speech is not dysarthric.  CRANIAL NERVES:  Pupils equal round and reactive to light.  Normal conjugate, extra-ocular eye movements in all directions of gaze.  Moderate ptosis bilaterally, worse on the right where eyelid overs 25% of the upper pupil.  Face is symmetric. Facial muscles are intact - orbicularis oculi, buccinator, and  orbicularis oris is 5/5. Palate elevates symmetrically.  Tongue is midline and strength is 5/5.  MOTOR:  Motor strength is 5/5 in all extremities, including bilateral hip flexion (improved).  Neck flexion is 5/5.  No atrophy, fasciculations or abnormal movements.      COORDINATION/GAIT:  Normal finger-to- nose-finger.  It takes patient several repeated effort to stand from chair without pushing off.  Gait slightly wide-based, assisted with cane.  Data: MRI brain wo contrast 09/09/2015: 1.  No acute intracranial abnormality. 2. 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 No progression identified since 2016.   NCS/EMG of the right side 09/30/2016: The electrophysiologic findings are of suggestive of postsynaptic neuromuscular junction disorder. 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  Labs 09/13/2015:  AChR binding 0.47*, TSH 1.08, ESR 2, ACE 34, ANA neg  IMPRESSION/PLAN 1.  Seropositive generalized myasthenia gravis.  Clinically, he is doing great.  There is improved strength of hip flexors which were previously weak and he no longer experiences double vision since starting mestinon 29m BID.  Because his symptoms are well-controlled on mestinon alone, I will not start him on immunosuppressive medication.  He will be continued on mestinon 640mBID and may take an extra tablet as needed.  2.  Parkinson's disease, followed by Dr. TaCarles Collet   Return to clinic in 6 months   The duration of this appointment visit was 20 minutes of face-to-face time with the patient.  Greater than 50% of this time was spent in counseling, explanation of diagnosis, planning of further management, and coordination of care.   Thank you for allowing me to participate in patient's care.  If I can answer any additional questions, I would be pleased to do so.    Sincerely,    Wyatt Galvan K. PaPosey ProntoDO

## 2017-05-11 ENCOUNTER — Ambulatory Visit (INDEPENDENT_AMBULATORY_CARE_PROVIDER_SITE_OTHER): Payer: Medicare Other

## 2017-05-11 DIAGNOSIS — Z23 Encounter for immunization: Secondary | ICD-10-CM

## 2017-05-26 ENCOUNTER — Other Ambulatory Visit: Payer: Self-pay | Admitting: *Deleted

## 2017-05-26 DIAGNOSIS — I714 Abdominal aortic aneurysm, without rupture, unspecified: Secondary | ICD-10-CM

## 2017-07-09 ENCOUNTER — Ambulatory Visit: Payer: Medicare Other | Admitting: Internal Medicine

## 2017-07-09 ENCOUNTER — Encounter: Payer: Self-pay | Admitting: Internal Medicine

## 2017-07-09 DIAGNOSIS — H1032 Unspecified acute conjunctivitis, left eye: Secondary | ICD-10-CM

## 2017-07-09 DIAGNOSIS — H109 Unspecified conjunctivitis: Secondary | ICD-10-CM | POA: Insufficient documentation

## 2017-07-09 MED ORDER — ERYTHROMYCIN 5 MG/GM OP OINT: 1.0000 "application " | TOPICAL_OINTMENT | Freq: Every day | OPHTHALMIC | 0 refills | Status: DC

## 2017-07-09 MED ORDER — ROSUVASTATIN CALCIUM 10 MG PO TABS: 10.0000 mg | ORAL_TABLET | Freq: Every day | ORAL | 3 refills | Status: DC

## 2017-07-09 NOTE — Patient Instructions (Signed)
We have sent in medicine for the eye. Use a thin ribbon on the eye before bedtime for the next 3 days.

## 2017-07-09 NOTE — Assessment & Plan Note (Signed)
Rx for erythromycin ointment for 3 days.

## 2017-07-09 NOTE — Progress Notes (Signed)
   Subjective:    Patient ID: Derek Harrell, male    DOB: 16-May-1946, 71 y.o.   MRN: [id number]  HPI The patient is a 71 YO man coming in for possible pink eye. Started this morning with discharge on the pillow. Possible trauma from the pillow as it was touching his eye in the morning. Denies itching or burning. Some tearing increased in that eye. No sick contact. No vision changes  Review of Systems  Constitutional: Negative.   HENT: Negative.   Eyes: Positive for discharge. Negative for photophobia, pain, itching and visual disturbance.  Respiratory: Negative for cough, chest tightness and shortness of breath.   Cardiovascular: Negative for chest pain, palpitations and leg swelling.  Gastrointestinal: Negative for abdominal distention, abdominal pain, constipation, diarrhea, nausea and vomiting.  Musculoskeletal: Negative.   Skin: Negative.       Objective:   Physical Exam  Constitutional: He is oriented to person, place, and time. He appears well-developed and well-nourished.  HENT:  Head: Normocephalic and atraumatic.  Eyes: EOM are normal. Pupils are equal, round, and reactive to light.  Left conjunctivae with some injection and redness.   Neck: Normal range of motion.  Cardiovascular: Normal rate and regular rhythm.  Pulmonary/Chest: Effort normal and breath sounds normal. No respiratory distress. He has no wheezes. He has no rales.  Abdominal: Soft.  Neurological: He is alert and oriented to person, place, and time. Coordination abnormal.  Cane with stiff gait  Skin: Skin is warm and dry.   Vitals:   07/09/17 1506  BP: 132/86  Pulse: (!) 53  Temp: 97.7 F (36.5 C)  TempSrc: Oral  SpO2: 99%  Weight: 202 lb (91.6 kg)  Height: 6\' 1"  (1.854 m)      Assessment & Plan:

## 2017-07-10 ENCOUNTER — Ambulatory Visit: Payer: Medicare Other | Admitting: Neurology

## 2017-07-16 ENCOUNTER — Other Ambulatory Visit: Payer: Self-pay

## 2017-07-16 ENCOUNTER — Encounter (HOSPITAL_COMMUNITY): Payer: Self-pay | Admitting: *Deleted

## 2017-07-16 ENCOUNTER — Ambulatory Visit (HOSPITAL_COMMUNITY)
Admission: EM | Admit: 2017-07-16 | Discharge: 2017-07-16 | Disposition: A | Discharge status: Home / Self Care | Payer: Medicare Other | Attending: Internal Medicine | Admitting: Internal Medicine

## 2017-07-16 ENCOUNTER — Ambulatory Visit (INDEPENDENT_AMBULATORY_CARE_PROVIDER_SITE_OTHER): Payer: Medicare Other

## 2017-07-16 DIAGNOSIS — M79672 Pain in left foot: Secondary | ICD-10-CM

## 2017-07-16 MED ORDER — PREDNISONE 20 MG PO TABS: 40.0000 mg | ORAL_TABLET | Freq: Every day | ORAL | 0 refills | Status: AC
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
## 2017-07-16 NOTE — ED Triage Notes (Signed)
Per pt he is having left foot pain, per pt this started last week, per pt he can't figure out why his left foot hurt, per pt he did not step on anything,

## 2017-07-16 NOTE — Discharge Instructions (Signed)
Ice, elevation, rest Will try 5 days of prednisone to help with pain. Tylenol may be taken additionally if needed. If symptoms worsen or do not improve in the next week to return to be seen or to follow up with PCP.
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
## 2017-07-20 ENCOUNTER — Encounter: Payer: Self-pay | Admitting: Psychology

## 2017-07-20 ENCOUNTER — Other Ambulatory Visit: Payer: Self-pay | Admitting: Neurology

## 2017-07-20 ENCOUNTER — Encounter: Payer: Self-pay | Admitting: Neurology

## 2017-07-20 ENCOUNTER — Ambulatory Visit: Payer: Medicare Other | Admitting: Neurology

## 2017-07-20 VITALS — BP 140/84 | HR 60 | Ht 73.0 in | Wt 199.0 lb

## 2017-07-20 DIAGNOSIS — G20 Parkinson's disease: Secondary | ICD-10-CM | POA: Diagnosis not present

## 2017-07-20 DIAGNOSIS — R441 Visual hallucinations: Secondary | ICD-10-CM

## 2017-07-20 DIAGNOSIS — G7000 Myasthenia gravis without (acute) exacerbation: Secondary | ICD-10-CM

## 2017-07-20 DIAGNOSIS — G20A1 Parkinson's disease without dyskinesia, without mention of fluctuations: Secondary | ICD-10-CM

## 2017-07-20 NOTE — Patient Instructions (Signed)
Stop your ditropan.  Let me know if your hallucinations don't go away with this change

## 2017-07-21 ENCOUNTER — Other Ambulatory Visit: Payer: Self-pay | Admitting: *Deleted

## 2017-07-21 MED ORDER — PYRIDOSTIGMINE BROMIDE 60 MG PO TABS: ORAL_TABLET | ORAL | 5 refills | Status: DC

## 2017-08-05 ENCOUNTER — Other Ambulatory Visit: Payer: Self-pay | Admitting: Neurology

## 2017-09-21 ENCOUNTER — Ambulatory Visit: Payer: Medicare Other | Admitting: Neurology

## 2017-09-21 ENCOUNTER — Encounter: Payer: Self-pay | Admitting: Neurology

## 2017-09-21 VITALS — BP 110/68 | HR 68 | Ht 73.0 in | Wt 196.1 lb

## 2017-09-21 DIAGNOSIS — G7000 Myasthenia gravis without (acute) exacerbation: Secondary | ICD-10-CM

## 2017-09-21 MED ORDER — PYRIDOSTIGMINE BROMIDE 60 MG PO TABS: ORAL_TABLET | ORAL | 3 refills | Status: DC

## 2017-09-21 NOTE — Patient Instructions (Signed)
Continues mestinon 60mg  three times daily  Return to clinic in 30-months

## 2017-09-21 NOTE — Progress Notes (Signed)
Follow-up Visit   Date: 09/21/17    Keval Nam MRN: [id number] DOB: 29-Nov-1945   Interim History: Derek Harrell is a 72 y.o. right-handed African American male with parkinsonism, hyperlipidemia, stroke, and hypertension here for follow-up of myasthenia gravis.  The patient was accompanied to the clinic by wife.    History of present illness: He reports having droopy eyelids since birth, which is worse on the right.  These symptoms have remained constant without worsening in the evening or with fatigue.  He was doing well until 2017, when he started having spells of double vision.  He was being followed by Dr. [name] at [facility] who checked AChR antibodies which returned positive.  CT chest was negative for thymoma.  Because he was asympomatic from [facility], he was not started on any medications.  He is  followed by parkinsonism and referred to Dr. Carles Collet for a second opinion who has referred the patient for my opinion on myasthenia.   He endorses generalized weakness, especially of the legs upon getting out of bed in the morning.  UPDATE 03/20/2017:  Here is here for follow-up of MG.  At his last visit, he was started on a trial of mestinon '60mg'$  BID and has noticed that his leg are stronger and buckling much less.  He does not notice double vision as much any more either, only when extremely tired.  He denies any difficulty swallowing or talking. No shortness of breath.  He denies arm weakness.  He has started to exercise 20-min twice daily with his wife and is not getting tired.   UPDATE 09/21/2017:  He is here for follow-up appointment for myasthenia.  He has been doing well and no spells of double vision, dysarthria, or weakness of the arms or legs.  He has accidentally skipped one dose of mestinon, but did not notice any breakthrough weakness.  Overall, he is doing well and has no new complaints.  He suffered one fall while outside in the ice/snow.    Medications:  Current Outpatient Medications on File  Prior to Visit  Medication Sig Dispense Refill  . AMITIZA 24 MCG capsule     . aspirin 81 MG EC tablet Take 1 tablet (81 mg total) by mouth daily. Swallow whole. 30 tablet 12  . carbidopa-levodopa (SINEMET CR) 50-200 MG tablet Take 1 tablet by mouth at bedtime. 90 tablet 1  . carbidopa-levodopa (SINEMET IR) 25-100 MG tablet Take 2 tablets by mouth 4 (four) times daily. 720 tablet 1  . clopidogrel (PLAVIX) 75 MG tablet Take 75 mg by mouth daily.    Marland Kitchen ipratropium (ATROVENT) 0.06 % nasal spray PLACE 2 SPRAYS IN EACH NOSTRIL FOUR TIMES DAILY FOR NASAL CONGESTION 15 mL 0  . nebivolol (BYSTOLIC) 10 MG tablet Take 10 mg by mouth daily.    Marland Kitchen oxybutynin (DITROPAN-XL) 10 MG 24 hr tablet Take 10 mg by mouth daily.  11  . rosuvastatin (CRESTOR) 10 MG tablet Take 1 tablet (10 mg total) by mouth daily. 90 tablet 3   No current facility-administered medications on file prior to visit.     Allergies: No Known Allergies  Review of Systems:  CONSTITUTIONAL: No fevers, chills, night sweats, or weight loss.  EYES: No visual changes or eye pain ENT: No hearing changes.  No history of nose bleeds.   RESPIRATORY: No cough, wheezing and shortness of breath.   CARDIOVASCULAR: Negative for chest pain, and palpitations.   GI: Negative for abdominal discomfort, blood in stools or black  stools.  No recent change in bowel habits.   GU:  No history of incontinence.   MUSCLOSKELETAL: No history of joint pain or swelling.  No myalgias.   SKIN: Negative for lesions, rash, and itching.   ENDOCRINE: Negative for cold or heat intolerance, polydipsia or goiter.   PSYCH:  No depression or anxiety symptoms.   NEURO: As Above.   Vital Signs:  BP 110/68   Pulse 68   Ht '6\' 1"'$  (1.854 m)   Wt 196 lb 2 oz (89 kg)   SpO2 95%   BMI 25.88 kg/m    Neurological Exam: MENTAL STATUS including orientation to time, place, person, recent and remote memory, attention span and concentration, language, and fund of knowledge is  normal.  Speech is not dysarthric.  CRANIAL NERVES:  Pupils equal round and reactive to light.  Normal conjugate, extra-ocular eye movements in all directions of gaze.  Moderate ptosis bilaterally, worse on the right where eyelid overs 25% of the upper pupil (unchanged).  Face is symmetric. Facial muscles are intact - orbicularis oculi, buccinator, and orbicularis oris is 5/5. Palate elevates symmetrically.  Tongue is midline and strength is 5/5.  MOTOR:  Motor strength is 5/5 in all extremities, including bilateral hip flexion      COORDINATION/GAIT:  Gait slightly wide-based, assisted with cane.  Data: MRI brain wo contrast 09/09/2015: 1.  No acute intracranial abnormality. 2. 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 No progression identified since 2016.   NCS/EMG of the right side 09/30/2016: The electrophysiologic findings are of suggestive of postsynaptic neuromuscular junction disorder. 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  Labs 09/13/2015:  AChR binding 0.47*, TSH 1.08, ESR 2, ACE 34, ANA neg  IMPRESSION/PLAN 1.  Seropositive generalized myasthenia gravis without exacerbation.  Clinically, he has no diplopia, bulbar or limb weakness.  He has congenital bilateral ptosis which has been unchanged, even with mestinon.   Continue mestinon '60mg'$  TID (8am, 2pm, and 8pm).  Continue to monitor off immunosuppresive medications.  2.  Parkinson's disease, followed by Dr. Carles Collet.    Return to clinic in 9 months  Greater than 50% of this 15 minute visit was spent in counseling, explanation of diagnosis, planning of further management, and coordination of care.    Thank you for allowing me to participate in patient's care.  If I can answer any additional questions, I would be pleased to do so.    Sincerely,    Donika K. Posey Pronto, DO

## 2017-10-05 ENCOUNTER — Telehealth: Payer: Self-pay | Admitting: Neurology

## 2017-10-05 NOTE — Telephone Encounter (Signed)
Returned call.  Spoke with pt (he would not let me speak with wife).  Pt states that he sees things when he is tired/sleepy - not 24/7.  Advised I would send message to Dr. Toney Rakes and return call with recommendations.

## 2017-10-05 NOTE — Telephone Encounter (Signed)
I don't think that I put him on a new med for hallucinations?  I told him to talk to urology about d/c ditropan which can contribute to them.  Did he do that?  Jury duty letter written

## 2017-10-05 NOTE — Telephone Encounter (Signed)
Patient's wife called needed to see if Dr. Carles Collet could write a letter for him to be excused from [employer] in April. Also, he is having hallucinations more. Please Call. She thinks the new medication he was put on is not seeming to help with it. Please Call [phone]. Thanks

## 2017-10-26 ENCOUNTER — Ambulatory Visit: Payer: Medicare Other | Admitting: Neurology

## 2017-10-26 ENCOUNTER — Other Ambulatory Visit: Payer: Medicare Other

## 2017-10-26 ENCOUNTER — Encounter: Payer: Self-pay | Admitting: Neurology

## 2017-10-26 VITALS — BP 152/80 | HR 56 | Ht 73.0 in | Wt 193.0 lb

## 2017-10-26 DIAGNOSIS — R443 Hallucinations, unspecified: Secondary | ICD-10-CM

## 2017-10-26 DIAGNOSIS — R251 Tremor, unspecified: Secondary | ICD-10-CM | POA: Diagnosis not present

## 2017-10-26 DIAGNOSIS — G20 Parkinson's disease: Secondary | ICD-10-CM | POA: Diagnosis not present

## 2017-10-26 DIAGNOSIS — Z5181 Encounter for therapeutic drug level monitoring: Secondary | ICD-10-CM | POA: Diagnosis not present

## 2017-10-26 DIAGNOSIS — E538 Deficiency of other specified B group vitamins: Secondary | ICD-10-CM

## 2017-10-26 NOTE — Patient Instructions (Signed)
1. Your provider has requested that you have labwork completed today. Please go to [facility] ([id number]) on the second floor of this building before leaving the office today. You do not need to check in. If you are not called within 15 minutes please check with the front desk.   
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
## 2017-10-27 ENCOUNTER — Ambulatory Visit: Payer: Medicare Other | Admitting: Neurology

## 2017-10-27 LAB — COMPREHENSIVE METABOLIC PANEL
AG Ratio: 1.4 (calc) (ref 1.0–2.5)
ALBUMIN MSPROF: 4 g/dL (ref 3.6–5.1)
ALT: 8 U/L — ABNORMAL LOW (ref 9–46)
AST: 13 U/L (ref 10–35)
Alkaline phosphatase (APISO): 53 U/L (ref 40–115)
BUN: 10 mg/dL (ref 7–25)
CHLORIDE: 107 mmol/L (ref 98–110)
CO2: 28 mmol/L (ref 20–32)
CREATININE: 0.82 mg/dL (ref 0.70–1.18)
Calcium: 9.3 mg/dL (ref 8.6–10.3)
GLOBULIN: 2.8 g/dL (ref 1.9–3.7)
GLUCOSE: 97 mg/dL (ref 65–99)
Potassium: 4.2 mmol/L (ref 3.5–5.3)
SODIUM: 144 mmol/L (ref 135–146)
TOTAL PROTEIN: 6.8 g/dL (ref 6.1–8.1)
Total Bilirubin: 1 mg/dL (ref 0.2–1.2)

## 2017-10-27 LAB — VITAMIN B12: Vitamin B-12: 382 pg/mL (ref 200–1100)

## 2017-10-27 LAB — TSH: TSH: 0.92 mIU/L (ref 0.40–4.50)

## 2017-10-27 NOTE — Telephone Encounter (Signed)
-----   Message from [place], DO sent at 10/27/2017  7:26 AM EDT ----- Find out if patient taking B12 supplement (oral or inject).  If not, please have him start.

## 2017-10-27 NOTE — Telephone Encounter (Signed)
Patient made aware.

## 2017-10-27 NOTE — Telephone Encounter (Signed)
Oral, 1069mcg daily is ok

## 2017-10-27 NOTE — Telephone Encounter (Signed)
Spoke with patient. He is not taking any sort of vitamin B12 supplement at this time.   Do you want him to start oral supplement or injections?

## 2018-01-20 ENCOUNTER — Emergency Department (HOSPITAL_COMMUNITY): Payer: Medicare Other

## 2018-01-20 ENCOUNTER — Encounter (HOSPITAL_COMMUNITY): Payer: Self-pay

## 2018-01-20 ENCOUNTER — Ambulatory Visit: Payer: Medicare Other | Admitting: Internal Medicine

## 2018-01-20 ENCOUNTER — Emergency Department (HOSPITAL_COMMUNITY)
Admission: EM | Admit: 2018-01-20 | Discharge: 2018-01-20 | Disposition: A | Discharge status: Home / Self Care | Payer: Medicare Other | Attending: Emergency Medicine | Admitting: Emergency Medicine

## 2018-01-20 ENCOUNTER — Encounter: Payer: Self-pay | Admitting: Internal Medicine

## 2018-01-20 DIAGNOSIS — Z7902 Long term (current) use of antithrombotics/antiplatelets: Secondary | ICD-10-CM | POA: Insufficient documentation

## 2018-01-20 DIAGNOSIS — Y92018 Other place in single-family (private) house as the place of occurrence of the external cause: Secondary | ICD-10-CM | POA: Diagnosis not present

## 2018-01-20 DIAGNOSIS — Z87891 Personal history of nicotine dependence: Secondary | ICD-10-CM | POA: Diagnosis not present

## 2018-01-20 DIAGNOSIS — Z8673 Personal history of transient ischemic attack (TIA), and cerebral infarction without residual deficits: Secondary | ICD-10-CM | POA: Diagnosis not present

## 2018-01-20 DIAGNOSIS — Z79899 Other long term (current) drug therapy: Secondary | ICD-10-CM | POA: Diagnosis not present

## 2018-01-20 DIAGNOSIS — S4992XA Unspecified injury of left shoulder and upper arm, initial encounter: Secondary | ICD-10-CM | POA: Diagnosis present

## 2018-01-20 DIAGNOSIS — R32 Unspecified urinary incontinence: Secondary | ICD-10-CM

## 2018-01-20 DIAGNOSIS — S42225A 2-part nondisplaced fracture of surgical neck of left humerus, initial encounter for closed fracture: Secondary | ICD-10-CM | POA: Diagnosis not present

## 2018-01-20 DIAGNOSIS — Y998 Other external cause status: Secondary | ICD-10-CM | POA: Diagnosis not present

## 2018-01-20 DIAGNOSIS — W01198A Fall on same level from slipping, tripping and stumbling with subsequent striking against other object, initial encounter: Secondary | ICD-10-CM | POA: Diagnosis not present

## 2018-01-20 DIAGNOSIS — Z7982 Long term (current) use of aspirin: Secondary | ICD-10-CM | POA: Diagnosis not present

## 2018-01-20 DIAGNOSIS — Y9301 Activity, walking, marching and hiking: Secondary | ICD-10-CM | POA: Diagnosis not present

## 2018-01-20 DIAGNOSIS — I10 Essential (primary) hypertension: Secondary | ICD-10-CM | POA: Diagnosis not present

## 2018-01-20 DIAGNOSIS — S42302A Unspecified fracture of shaft of humerus, left arm, initial encounter for closed fracture: Secondary | ICD-10-CM | POA: Insufficient documentation

## 2018-01-20 LAB — BASIC METABOLIC PANEL
ANION GAP: 7 (ref 5–15)
BUN: 11 mg/dL (ref 6–20)
CALCIUM: 8.8 mg/dL — AB (ref 8.9–10.3)
CO2: 28 mmol/L (ref 22–32)
Chloride: 107 mmol/L (ref 101–111)
Creatinine, Ser: 0.8 mg/dL (ref 0.61–1.24)
GFR calc Af Amer: >60 mL/min (ref >60)
Glucose, Bld: 108 mg/dL — ABNORMAL HIGH (ref 65–99)
Potassium: 3.8 mmol/L (ref 3.5–5.1)
SODIUM: 142 mmol/L (ref 135–145)

## 2018-01-20 LAB — CBC WITH DIFFERENTIAL/PLATELET
ABS IMMATURE GRANULOCYTES: 0 10*3/uL (ref 0.0–0.1)
BASOS ABS: 0 10*3/uL (ref 0.0–0.1)
BASOS PCT: 1 %
Eosinophils Absolute: 0.3 10*3/uL (ref 0.0–0.7)
Eosinophils Relative: 3 %
HCT: 44.6 % (ref 39.0–52.0)
Hemoglobin: 14.3 g/dL (ref 13.0–17.0)
IMMATURE GRANULOCYTES: 0 %
Lymphocytes Relative: 13 %
Lymphs Abs: 1 10*3/uL (ref 0.7–4.0)
MCH: 26.7 pg (ref 26.0–34.0)
MCHC: 32.1 g/dL (ref 30.0–36.0)
MCV: 83.2 fL (ref 78.0–100.0)
MONOS PCT: 6 %
Monocytes Absolute: 0.5 10*3/uL (ref 0.1–1.0)
NEUTROS ABS: 6.2 10*3/uL (ref 1.7–7.7)
NEUTROS PCT: 77 %
PLATELETS: 130 10*3/uL — AB (ref 150–400)
RBC: 5.36 MIL/uL (ref 4.22–5.81)
RDW: 13.4 % (ref 11.5–15.5)
WBC: 8 10*3/uL (ref 4.0–10.5)

## 2018-01-20 MED ORDER — KETOROLAC TROMETHAMINE 30 MG/ML IJ SOLN
30.0000 mg | Freq: Once | INTRAMUSCULAR | Status: AC
  Administered 2018-01-20: 30 mg via INTRAVENOUS
  Filled 2018-01-20: qty 1

## 2018-01-20 MED ORDER — HYDROCODONE-ACETAMINOPHEN 5-325 MG PO TABS: 1.0000 | ORAL_TABLET | ORAL | 0 refills | Status: AC | PRN

## 2018-01-20 MED ORDER — MIRABEGRON ER 50 MG PO TB24: 50.0000 mg | ORAL_TABLET | Freq: Every day | ORAL | 3 refills | Status: DC

## 2018-01-20 MED ORDER — HYDROCODONE-ACETAMINOPHEN 5-325 MG PO TABS
1.0000 | ORAL_TABLET | Freq: Once | ORAL | Status: AC
  Administered 2018-01-20: 1 via ORAL
  Filled 2018-01-20: qty 1

## 2018-01-20 NOTE — ED Provider Notes (Signed)
    Durable Medical Equipment  (From admission, onward)        Start     Ordered   01/20/18 1609  For home use only DME Shower stool  Once     01/20/18 1608   01/20/18 1609  For home use only DME standard manual wheelchair with seat cushion  Once    Comments:  Patient suffers from humerus fracture which impairs their ability to perform daily activities like bathing, dressing and grooming in the home.  A walker will not resolve  issue with performing activities of daily living. A wheelchair will allow patient to safely perform daily activities. Patient can safely propel the wheelchair in the home or has a caregiver who can provide assistance.  Accessories: elevating leg rests (ELRs), wheel locks, extensions and anti-tippers.   01/20/18 1608         Davonna Belling, MD 01/20/18 1644

## 2018-01-20 NOTE — Care Management (Cosign Needed Addendum)
    Durable Medical Equipment  (From admission, onward)        Start     Ordered   01/20/18 1609  For home use only DME Shower stool  Once     01/20/18 1608   01/20/18 1609  For home use only DME standard manual wheelchair with seat cushion  Once    Comments:  Patient suffers from humerus fracture which impairs their ability to perform daily activities like bathing, dressing and grooming in the home.  A walker will not resolve  issue with performing activities of daily living. A wheelchair will allow patient to safely perform daily activities. Patient can safely propel the wheelchair in the home or has a caregiver who can provide assistance.  Accessories: elevating leg rests (ELRs), wheel locks, extensions and anti-tippers.   01/20/18 1608      ED CM received consult concerning DME needs. Patient has a rolling walker home but will benefit from from a wheel chair and shower chair. Discussed HH recommendations and offered choice.  Patient and family are agreeable, but will like to discuss with another family member and plans to call back with [facility] agency. DME was delivered to patient by [facility] prior to discharged today.CM awaiting call from family to arrange [facility].
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
## 2018-01-20 NOTE — ED Triage Notes (Addendum)
Pt presents for evaluation of possible L shoulder dislocation following fall today. Pt denies LOC. On plavix. Pt with hx of parkinsons, non ambulatory or weight bearing. From home. EMS reports bradycardia of 45. Pt given 50 mcg fentanyl in route.

## 2018-01-20 NOTE — Progress Notes (Signed)
   Subjective:    Patient ID: Derek Harrell, male    DOB: [birth year], 72 y.o.   MRN: [id number]  HPI The patient is a 72 YO man coming in for fatigue. He is not sleeping well lately due to multiple trips to the bathroom at night time. He was on ditropan from urology however this worsened his parkinson's and was stopped recently by neurology. He was also having some side effects that were directly related to this. He is not taking anything currently for his excessive urination. This also is present during the daytime. Denies any burning. No rash or discharge.   Review of Systems  Constitutional: Positive for fatigue.  HENT: Negative.   Eyes: Negative.   Respiratory: Negative for cough, chest tightness and shortness of breath.   Cardiovascular: Negative for chest pain, palpitations and leg swelling.  Gastrointestinal: Negative for abdominal distention, abdominal pain, constipation, diarrhea, nausea and vomiting.  Genitourinary: Positive for enuresis and frequency.  Musculoskeletal: Negative.   Skin: Negative.   Neurological: Negative.   Psychiatric/Behavioral: Positive for sleep disturbance. Negative for self-injury and suicidal ideas.      Objective:   Physical Exam  Constitutional: He is oriented to person, place, and time. He appears well-developed and well-nourished.  HENT:  Head: Normocephalic and atraumatic.  Eyes: EOM are normal.  Neck: Normal range of motion.  Cardiovascular: Normal rate and regular rhythm.  Pulmonary/Chest: Effort normal and breath sounds normal. No respiratory distress. He has no wheezes. He has no rales.  Abdominal: Soft. He exhibits no distension. There is no tenderness. There is no rebound.  Musculoskeletal: He exhibits no edema.  Neurological: He is alert and oriented to person, place, and time. Coordination normal.  Skin: Skin is warm and dry.  Psychiatric: He has a normal mood and affect.   Vitals:   01/20/18 0943  BP: 132/90  Pulse: (!) 54  Temp: 97.7  F (36.5 C)  TempSrc: Oral  SpO2: 97%  Weight: 191 lb (86.6 kg)  Height: 6\' 1"  (1.854 m)      Assessment & Plan:

## 2018-01-20 NOTE — Patient Instructions (Signed)
We have sent in myrbetriq to use daily for the bladder for 1 month to try. If it works keep taking it.

## 2018-01-20 NOTE — Progress Notes (Signed)
Orthopedic Tech Progress Note Patient Details:  Derek Harrell [birth year] [id number]  Patient ID: Derek Harrell, male   DOB: April 07, 1946, 72 y.o.   MRN: [id number] Ortho tech visit  Derek Harrell 01/20/2018, 4:15 PM
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
## 2018-01-20 NOTE — ED Provider Notes (Signed)
Patient placed in Quick Look pathway, seen and evaluated   Chief Complaint: Fall  HPI:   72 year old male presents with a mechanical fall at home. PMH of Parkinson's. He tripped and fell forward. He was unable to get up off the floor due to pain. His wife called EMS who transported. He is currently complaining of L shoulder pain. He denies head injury, LOC, chest pain, SOB, abdominal pain, lower extremity pain at this time.  ROS: +shoulder pain.  Physical Exam:   Gen: No distress  Neuro: Awake and Alert  Skin: Warm    Focused Exam: L shoulder in sling. Tender to palpation. No tenderness of elbow, wrist, hand. 2+ radial pulse   Initiation of care has begun. The patient has been counseled on the process, plan, and necessity for staying for the completion/evaluation, and the remainder of the medical screening examination    Recardo Evangelist, PA-C 01/20/18 [place], Julie, MD 01/20/18 1402

## 2018-01-20 NOTE — Discharge Instructions (Addendum)
Be safe! You now have a broken arm. Keep arm immobilized until you can see the orthopedic doc.   Please be sure to call the orthopedic doctor ASAP to schedule an appointment.  In addition to the pain medication prescribed, you may also take Tylenol for pain.

## 2018-01-20 NOTE — ED Notes (Signed)
Spoke with Mariann Laster CSW and she will come speak with family.

## 2018-01-20 NOTE — Progress Notes (Signed)
Orthopedic Tech Progress Note Patient Details:  Derek Harrell 1946/01/30 [id number]  Ortho Devices Type of Ortho Device: Arm sling, Long arm splint Ortho Device/Splint Location: lue Ortho Device/Splint Interventions: Application   Post Interventions Patient Tolerated: Well Instructions Provided: Care of device   Hildred Priest 01/20/2018, 4:15 PM

## 2018-01-21 NOTE — Progress Notes (Signed)
[facility] referral faxed to [facility].

## 2018-01-22 DIAGNOSIS — R32 Unspecified urinary incontinence: Secondary | ICD-10-CM | POA: Insufficient documentation

## 2018-01-22 NOTE — Assessment & Plan Note (Signed)
Rx for myrbetriq 50 mg daily. He is fatigued due to multiple night time trips to the bathroom. In order to avoid sedating medications we will treat the underlying problem and not just sleep.
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
## 2018-02-05 ENCOUNTER — Encounter: Payer: Self-pay | Admitting: Neurology

## 2018-02-05 ENCOUNTER — Telehealth: Payer: Self-pay | Admitting: Internal Medicine

## 2018-02-05 ENCOUNTER — Ambulatory Visit: Payer: Medicare Other | Admitting: Neurology

## 2018-02-05 VITALS — BP 120/78 | HR 64

## 2018-02-05 DIAGNOSIS — G20 Parkinson's disease: Secondary | ICD-10-CM

## 2018-02-05 DIAGNOSIS — G7000 Myasthenia gravis without (acute) exacerbation: Secondary | ICD-10-CM

## 2018-02-05 MED ORDER — ENTACAPONE 200 MG PO TABS: 200.0000 mg | ORAL_TABLET | Freq: Four times a day (QID) | ORAL | 1 refills | Status: DC

## 2018-02-05 MED ORDER — CARBIDOPA-LEVODOPA 25-100 MG PO TABS: 2.0000 | ORAL_TABLET | Freq: Four times a day (QID) | ORAL | 1 refills | Status: DC

## 2018-02-05 MED ORDER — CARBIDOPA-LEVODOPA ER 50-200 MG PO TBCR: 1.0000 | EXTENDED_RELEASE_TABLET | Freq: Every day | ORAL | 1 refills | Status: DC

## 2018-02-05 NOTE — Telephone Encounter (Signed)
Would need face to face to evaluate for home health versus ambulatory PT/OT.

## 2018-02-05 NOTE — Patient Instructions (Signed)
Registration is OPEN!    Third Annual Parkinson's Education Symposium   To register: [email or web address]      Search:  [employer] person attending individually Questions: [place], [place] or [email or web address]

## 2018-02-05 NOTE — Telephone Encounter (Signed)
Appointment scheduled.

## 2018-02-05 NOTE — Telephone Encounter (Signed)
Copied from [place] [phone]. Topic: Quick Communication - See Telephone Encounter >> Feb 05, 2018  1:26 PM Boyd Kerbs wrote: CRM for notification. See Telephone encounter for: 02/05/18.  [facility] [phone] needing verbal orders PT & OT to evaluate and treat

## 2018-02-05 NOTE — Telephone Encounter (Signed)
Can you please schedule patient for an appointment if they want to be seen for PT OT

## 2018-02-05 NOTE — Telephone Encounter (Signed)
I am not sure he qualifies for home health. Has something changed in his health since last visit on 01/20/18?

## 2018-02-05 NOTE — Telephone Encounter (Signed)
Patient became unsteady and fell causing patient to fracture his left upper arm. Now needing PT and OT

## 2018-02-11 ENCOUNTER — Ambulatory Visit: Payer: Medicare Other | Admitting: Internal Medicine

## 2018-02-11 ENCOUNTER — Encounter: Payer: Self-pay | Admitting: Internal Medicine

## 2018-02-11 VITALS — BP 122/82 | HR 64 | Ht 73.0 in | Wt 187.0 lb

## 2018-02-11 DIAGNOSIS — R269 Unspecified abnormalities of gait and mobility: Secondary | ICD-10-CM | POA: Diagnosis not present

## 2018-02-11 DIAGNOSIS — S42215A Unspecified nondisplaced fracture of surgical neck of left humerus, initial encounter for closed fracture: Secondary | ICD-10-CM

## 2018-02-11 DIAGNOSIS — G7000 Myasthenia gravis without (acute) exacerbation: Secondary | ICD-10-CM

## 2018-02-11 NOTE — Patient Instructions (Signed)
We will get you in with the neuro rehab to work on balance and function.

## 2018-02-11 NOTE — Progress Notes (Signed)
   Subjective:    Patient ID: Derek Harrell, male    DOB: [birth year], 72 y.o.   MRN: [id number]  HPI The patient is a 72 YO man coming in for ER follow up (for humerus fracture left arm, in sling, saw orthopedics and not given clear instructions on activity level, told he would need PT afterwards, going back in several weeks). Still having pain in the arm, using the sling. This is keeping him from getting in and out of bed without help. No more falls. He is having his chronic gait instability from his neurological disease. Had heard about neurorehab at his neurologist. Wants to do something to make him more stable. No cane today.  Review of Systems  Constitutional: Positive for activity change.  HENT: Negative.   Respiratory: Negative for cough, chest tightness and shortness of breath.   Cardiovascular: Negative for chest pain, palpitations and leg swelling.  Gastrointestinal: Negative for abdominal distention, abdominal pain, constipation, diarrhea, nausea and vomiting.  Musculoskeletal: Positive for arthralgias, gait problem and myalgias.  Skin: Negative.   Psychiatric/Behavioral: Negative.       Objective:   Physical Exam  Constitutional: He is oriented to person, place, and time. He appears well-developed and well-nourished.  HENT:  Head: Normocephalic and atraumatic.  Eyes: EOM are normal.  Neck: Normal range of motion.  Cardiovascular: Normal rate and regular rhythm.  Pulmonary/Chest: Effort normal and breath sounds normal. No respiratory distress. He has no wheezes. He has no rales.  Abdominal: Soft. He exhibits no distension. There is no tenderness. There is no rebound.  Musculoskeletal: He exhibits tenderness. He exhibits no edema.  Left arm in sling, pain in the upper arm.   Neurological: He is alert and oriented to person, place, and time. Coordination abnormal.  Hesitant gait but walking without cane in the office with stability.  Skin: Skin is warm and dry.  Psychiatric: He  has a normal mood and affect.   Vitals:   02/11/18 0812  BP: 122/82  Pulse: 64  SpO2: 97%  Weight: 187 lb (84.8 kg)  Height: 6\' 1"  (1.854 m)      Assessment & Plan:

## 2018-02-12 DIAGNOSIS — S42309A Unspecified fracture of shaft of humerus, unspecified arm, initial encounter for closed fracture: Secondary | ICD-10-CM | POA: Insufficient documentation

## 2018-02-12 NOTE — Assessment & Plan Note (Signed)
Refer to neurorehab. I think this will be better for him and he does not qualify as homebound for home health. We will not sign off on home health which was ordered by someone after ER visit. He is in agreement and wants the specialized care from neurorehab. I would be hesitant to do home health at this time regardless as his activity limitations are not clear from orthopedics.

## 2018-02-12 NOTE — Assessment & Plan Note (Signed)
Has been seeing orthopedics and they are managing. He is in sling until he sees them back and has limited function of the arm. He is needing some assistance with getting out of bed due to this.

## 2018-02-17 NOTE — Telephone Encounter (Signed)
Copied from [place] [phone]. Topic: Quick Communication - See Telephone Encounter >> Feb 05, 2018  1:26 PM Boyd Kerbs wrote: CRM for notification. See Telephone encounter for: 02/05/18.  [facility] [phone] needing verbal orders PT & OT to evaluate and treat

## 2018-02-17 NOTE — Telephone Encounter (Signed)
Derek Harrell calling from [place] called and is inquiring about the orders for Pt/ OT.She states she has not heard anything back about this  CB# [phone]

## 2018-02-17 NOTE — Telephone Encounter (Signed)
Called left message for jill explaining that patient will not be signed off for the PT/OT orders and will be going to neuro Rehab

## 2018-03-04 ENCOUNTER — Ambulatory Visit (HOSPITAL_COMMUNITY)
Admission: RE | Admit: 2018-03-04 | Discharge: 2018-03-04 | Disposition: A | Discharge status: Home / Self Care | Payer: Medicare Other | Source: Ambulatory Visit | Attending: Cardiology | Admitting: Cardiology

## 2018-03-04 DIAGNOSIS — I714 Abdominal aortic aneurysm, without rupture, unspecified: Secondary | ICD-10-CM

## 2018-03-08 ENCOUNTER — Other Ambulatory Visit: Payer: Self-pay | Admitting: *Deleted

## 2018-03-08 DIAGNOSIS — I714 Abdominal aortic aneurysm, without rupture, unspecified: Secondary | ICD-10-CM

## 2018-03-10 ENCOUNTER — Telehealth: Payer: Self-pay | Admitting: Internal Medicine

## 2018-03-10 NOTE — Telephone Encounter (Signed)
Copied from [place] [phone]. Topic: Quick Communication - See Telephone Encounter >> Mar 10, 2018 11:12 AM Burchel, Abbi R wrote: CRM for notification. See Telephone encounter for: 03/10/18.  Mingo Amber ([place]) states that pt was homebound when home nursing visits were started and was d/c when he was no longer home-bound (02/25/18).  Can Shanda resend plan of care certification and have it signed?  Please advise.  Mingo Amber:  [phone]

## 2018-03-10 NOTE — Telephone Encounter (Signed)
Is this the patient that does not qualify for home health?

## 2018-03-11 ENCOUNTER — Telehealth: Payer: Self-pay | Admitting: Neurology

## 2018-03-11 NOTE — Telephone Encounter (Signed)
Derek Harrell is requesting to speak with nurse regarding message left

## 2018-03-11 NOTE — Telephone Encounter (Signed)
Patient's wife called and left a voicemail stating she had questions concerning Zakariya's medication and side effects. This is for the entacapone medication. Please call her back at [phone]. Thanks.

## 2018-03-11 NOTE — Telephone Encounter (Signed)
For now, put him in at 8:15 on work in [place], 8/21.  Stop the comtan.  Also, see me about his PT orders

## 2018-03-11 NOTE — Telephone Encounter (Signed)
Left message on machine for patient to call back.

## 2018-03-11 NOTE — Telephone Encounter (Signed)
[facility], confirmed orders for [facility] did not come from PCP. Recommended she check with [facility] as there was a follow up visit there on 02/05/18.

## 2018-03-11 NOTE — Telephone Encounter (Signed)
Called Derek Harrell back and states that on 01/29/2018 we they had received verbal orderes from an [place]. I informed them that there is no record of it on our end but that I would look into it and someone would be in contact with her. Derek Harrell stated that if the orders do not get signed off they will be billed to the patient.

## 2018-03-11 NOTE — Telephone Encounter (Signed)
Patient's wife called back and discussed her recent concerns about patient. Since starting Comtan a month ago his symptoms seem better (medicine lasts until next dose) and he is sleeping well. He is no longer having freezing. She is concerned because he has developed dykinesia, which is effecting balance and his mood/hallucinations are worse.   Patient has had increased hallucinations and paranoia related to them. He is now seeing people, not feeling safe in his home, and wandering off. A couple days ago he walked about 2 miles away (he isn't driving) because he thought people were in his house packing up all his stuff. Evening time is worse. No recent other medicine changes or infections, UTI's, etc. He was taking pain medicine for two days when he hurt his arm, but has not been on that in weeks. He is seeing Dr. Onnie Graham and arm healing well. He starting PT for it on 03/23/18.  Please advise.

## 2018-03-11 NOTE — Telephone Encounter (Signed)
No, in my opinion he did not qualify for homebound and I never ordered home health services so I will not sign off on them. If someone else ordered these home health services they need to send the orders to that provider.

## 2018-03-11 NOTE — Telephone Encounter (Signed)
LVM informing Derek Harrell of MD response

## 2018-03-12 NOTE — Telephone Encounter (Signed)
I called [facility] and stated we would sign orders, but PT has been cancelled for now. He is scheduled on 03/23/18 at [facility]. Will discuss with patient whether they want [place] to send a new order to [facility] to have therapy in the home. It is for his hurt shoulder/arm.   Spoke with wife and she has no trouble transporting patient to Rehab center for therapy. They are aware of appt and will stop Comtan now.
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
## 2018-03-23 ENCOUNTER — Ambulatory Visit: Payer: Medicare Other | Attending: Internal Medicine | Admitting: Physical Therapy

## 2018-03-23 ENCOUNTER — Encounter: Payer: Self-pay | Admitting: Physical Therapy

## 2018-03-23 ENCOUNTER — Ambulatory Visit: Payer: Medicare Other | Admitting: Occupational Therapy

## 2018-03-23 DIAGNOSIS — R29818 Other symptoms and signs involving the nervous system: Secondary | ICD-10-CM | POA: Diagnosis present

## 2018-03-23 DIAGNOSIS — R41840 Attention and concentration deficit: Secondary | ICD-10-CM | POA: Diagnosis present

## 2018-03-23 DIAGNOSIS — M6281 Muscle weakness (generalized): Secondary | ICD-10-CM | POA: Diagnosis present

## 2018-03-23 DIAGNOSIS — R278 Other lack of coordination: Secondary | ICD-10-CM | POA: Diagnosis present

## 2018-03-23 DIAGNOSIS — R293 Abnormal posture: Secondary | ICD-10-CM | POA: Insufficient documentation

## 2018-03-23 DIAGNOSIS — M25612 Stiffness of left shoulder, not elsewhere classified: Secondary | ICD-10-CM | POA: Insufficient documentation

## 2018-03-23 DIAGNOSIS — R41844 Frontal lobe and executive function deficit: Secondary | ICD-10-CM | POA: Insufficient documentation

## 2018-03-23 DIAGNOSIS — R29898 Other symptoms and signs involving the musculoskeletal system: Secondary | ICD-10-CM | POA: Diagnosis present

## 2018-03-23 DIAGNOSIS — R2689 Other abnormalities of gait and mobility: Secondary | ICD-10-CM | POA: Insufficient documentation

## 2018-03-23 DIAGNOSIS — R2681 Unsteadiness on feet: Secondary | ICD-10-CM

## 2018-03-24 ENCOUNTER — Encounter: Payer: Self-pay | Admitting: Neurology

## 2018-03-24 ENCOUNTER — Encounter: Payer: Self-pay | Admitting: Physical Therapy

## 2018-03-24 ENCOUNTER — Ambulatory Visit: Payer: Medicare Other | Admitting: Neurology

## 2018-03-24 VITALS — BP 128/88 | HR 59 | Ht 73.0 in | Wt 189.0 lb

## 2018-03-24 DIAGNOSIS — G20 Parkinson's disease: Secondary | ICD-10-CM | POA: Diagnosis not present

## 2018-03-24 DIAGNOSIS — R443 Hallucinations, unspecified: Secondary | ICD-10-CM | POA: Diagnosis not present
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
## 2018-03-24 NOTE — Patient Instructions (Addendum)
I don't want you to drive unless you pass the driving evaluation.  If you are interested in the driving assessment, you can contact [employer] in [place]. [phone].  Please let them know to send me a copy of the evaluation.

## 2018-03-30 ENCOUNTER — Telehealth: Payer: Self-pay | Admitting: Neurology

## 2018-03-30 NOTE — Telephone Encounter (Signed)
Submitted via covermymeds. Awaiting response.

## 2018-03-30 NOTE — Telephone Encounter (Signed)
Received approval for medication and faxed to [place] at [phone] with confirmation received.

## 2018-03-30 NOTE — Telephone Encounter (Signed)
Derek Harrell calling stating medication sent in for patient last Wed 8.21.19 is requiring a prior auth with [employer].  Derek Harrell advising to call [phone] for [employer] and getting to prior auth approved.

## 2018-03-31 ENCOUNTER — Ambulatory Visit: Payer: Medicare Other | Admitting: Occupational Therapy

## 2018-03-31 DIAGNOSIS — M6281 Muscle weakness (generalized): Secondary | ICD-10-CM

## 2018-03-31 DIAGNOSIS — R41840 Attention and concentration deficit: Secondary | ICD-10-CM

## 2018-03-31 DIAGNOSIS — R41844 Frontal lobe and executive function deficit: Secondary | ICD-10-CM

## 2018-03-31 DIAGNOSIS — M25612 Stiffness of left shoulder, not elsewhere classified: Secondary | ICD-10-CM

## 2018-03-31 DIAGNOSIS — R2689 Other abnormalities of gait and mobility: Secondary | ICD-10-CM

## 2018-03-31 DIAGNOSIS — R2681 Unsteadiness on feet: Secondary | ICD-10-CM

## 2018-03-31 DIAGNOSIS — R29898 Other symptoms and signs involving the musculoskeletal system: Secondary | ICD-10-CM

## 2018-03-31 DIAGNOSIS — R29818 Other symptoms and signs involving the nervous system: Secondary | ICD-10-CM

## 2018-03-31 DIAGNOSIS — R278 Other lack of coordination: Secondary | ICD-10-CM

## 2018-03-31 DIAGNOSIS — R293 Abnormal posture: Secondary | ICD-10-CM
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
## 2018-04-02 ENCOUNTER — Encounter: Payer: Self-pay | Admitting: Physical Therapy

## 2018-04-02 ENCOUNTER — Ambulatory Visit: Payer: Medicare Other | Admitting: Physical Therapy

## 2018-04-02 DIAGNOSIS — R2689 Other abnormalities of gait and mobility: Secondary | ICD-10-CM | POA: Diagnosis not present

## 2018-04-02 DIAGNOSIS — R2681 Unsteadiness on feet: Secondary | ICD-10-CM

## 2018-04-02 DIAGNOSIS — M6281 Muscle weakness (generalized): Secondary | ICD-10-CM

## 2018-04-02 DIAGNOSIS — R293 Abnormal posture: Secondary | ICD-10-CM
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
## 2018-04-07 ENCOUNTER — Encounter: Payer: Self-pay | Admitting: Occupational Therapy

## 2018-04-07 ENCOUNTER — Ambulatory Visit: Payer: Medicare Other | Attending: Internal Medicine | Admitting: Occupational Therapy

## 2018-04-07 DIAGNOSIS — M25612 Stiffness of left shoulder, not elsewhere classified: Secondary | ICD-10-CM | POA: Insufficient documentation

## 2018-04-07 DIAGNOSIS — R41844 Frontal lobe and executive function deficit: Secondary | ICD-10-CM | POA: Diagnosis present

## 2018-04-07 DIAGNOSIS — R278 Other lack of coordination: Secondary | ICD-10-CM | POA: Insufficient documentation

## 2018-04-07 DIAGNOSIS — R293 Abnormal posture: Secondary | ICD-10-CM | POA: Diagnosis present

## 2018-04-07 DIAGNOSIS — R2681 Unsteadiness on feet: Secondary | ICD-10-CM | POA: Insufficient documentation

## 2018-04-07 DIAGNOSIS — R29898 Other symptoms and signs involving the musculoskeletal system: Secondary | ICD-10-CM

## 2018-04-07 DIAGNOSIS — M6281 Muscle weakness (generalized): Secondary | ICD-10-CM | POA: Diagnosis present

## 2018-04-07 DIAGNOSIS — R41840 Attention and concentration deficit: Secondary | ICD-10-CM | POA: Diagnosis present

## 2018-04-07 DIAGNOSIS — R2689 Other abnormalities of gait and mobility: Secondary | ICD-10-CM | POA: Diagnosis present

## 2018-04-07 DIAGNOSIS — R29818 Other symptoms and signs involving the nervous system: Secondary | ICD-10-CM | POA: Diagnosis not present
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
## 2018-04-07 NOTE — Patient Instructions (Signed)
  CAN USE POOL NOODLE (CUT TO ABOUT 20-24 INCHES--SHOULDER WIDTH), OR STICK THAT IS ABOUT THIS LONG     ROM: Extension - Wand (Standing)    Stand holding wand behind back. Raise arms as far as possible.  PALMS FACING FORWARD  10-15 reps per set, 1-2 times per Pistilli    Copyright  VHI. All rights reserved.  SHOULDER: External Rotation - Supine (Cane)    Hold cane with both hands. Rotate arm away from body. Keep elbow on floor and next to body. 10-15 reps per set, 1-2 times per Pyle Add towel to keep elbow at side.   PALMS UP    Shoulder: Abduction (Supine)    With right arm flat on floor, hold dowel in palm. Slowly move arm up to side of head by pushing with opposite arm. Do not let elbow bend.  LEFT THUMB UP 10-15 reps per set, 1-2 times per Robel CAUTION: Stretch slowly and gently.    SELF ASSISTED WITH OBJECT: Shoulder Pinch    Hold cane with both hands. Pinch shoulder blades together. Do not shrug shoulders.  10-15 reps per set, 1-2 times per [place]  Copyright  VHI. All rights reserved.  SELF ASSISTED WITH OBJECT: Shoulder Flexion - Supine    Hold cane with both hands. Raise arms overhead, keep elbows straight. 10-15 reps per set, 1-2 times per Yablonski.  HOLD NOODLE/STICK BY THE SIDES WITH THUMB FACING UP.

## 2018-04-09 ENCOUNTER — Encounter: Payer: Self-pay | Admitting: Physical Therapy

## 2018-04-09 ENCOUNTER — Ambulatory Visit: Payer: Medicare Other | Admitting: Physical Therapy

## 2018-04-09 DIAGNOSIS — R2681 Unsteadiness on feet: Secondary | ICD-10-CM

## 2018-04-09 DIAGNOSIS — R2689 Other abnormalities of gait and mobility: Secondary | ICD-10-CM

## 2018-04-09 DIAGNOSIS — R29818 Other symptoms and signs involving the nervous system: Secondary | ICD-10-CM | POA: Diagnosis not present

## 2018-04-09 DIAGNOSIS — M6281 Muscle weakness (generalized): Secondary | ICD-10-CM

## 2018-04-09 DIAGNOSIS — R293 Abnormal posture: Secondary | ICD-10-CM

## 2018-04-09 NOTE — Patient Instructions (Signed)
Access Code: [email or web address]  URL: [email or web address]  Date: 04/09/2018  Prepared by: Mady Haagensen   Exercises  Heel Toe Raises with Counter Support - 10 reps - 2 sets - 3 sec hold - 1x daily - 5x weekly  Alternating Step Forward with Support - 10 reps - 2 sets - 1x daily - 5x weekly  Sidestepping - 10 reps - 2 sets - 1x daily - 5x weekly  Alternating Backward Step - 10 reps - 2 sets - 1x daily - 5x weekly
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
## 2018-04-12 ENCOUNTER — Ambulatory Visit: Payer: Medicare Other | Admitting: Physical Therapy

## 2018-04-12 ENCOUNTER — Encounter: Payer: Self-pay | Admitting: Physical Therapy

## 2018-04-12 ENCOUNTER — Encounter: Payer: Self-pay | Admitting: Occupational Therapy

## 2018-04-12 ENCOUNTER — Ambulatory Visit: Payer: Medicare Other | Admitting: Occupational Therapy

## 2018-04-12 DIAGNOSIS — R29818 Other symptoms and signs involving the nervous system: Secondary | ICD-10-CM | POA: Diagnosis not present

## 2018-04-12 DIAGNOSIS — R278 Other lack of coordination: Secondary | ICD-10-CM

## 2018-04-12 DIAGNOSIS — R2681 Unsteadiness on feet: Secondary | ICD-10-CM

## 2018-04-12 DIAGNOSIS — M6281 Muscle weakness (generalized): Secondary | ICD-10-CM

## 2018-04-12 DIAGNOSIS — R29898 Other symptoms and signs involving the musculoskeletal system: Secondary | ICD-10-CM

## 2018-04-12 DIAGNOSIS — R2689 Other abnormalities of gait and mobility: Secondary | ICD-10-CM

## 2018-04-12 DIAGNOSIS — R293 Abnormal posture: Secondary | ICD-10-CM

## 2018-04-12 DIAGNOSIS — M25612 Stiffness of left shoulder, not elsewhere classified: Secondary | ICD-10-CM

## 2018-04-12 DIAGNOSIS — R41840 Attention and concentration deficit: Secondary | ICD-10-CM

## 2018-04-12 DIAGNOSIS — R41844 Frontal lobe and executive function deficit: Secondary | ICD-10-CM
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
## 2018-04-12 NOTE — Patient Instructions (Signed)
Access Code: [email or web address]  URL: [email or web address]  Date: 04/09/2018  Prepared by: Mady Haagensen   Exercises  Heel Toe Raises with Counter Support - 10 reps - 2 sets - 3 sec hold - 1x daily - 5x weekly  Alternating Step Forward with Support - 10 reps - 2 sets - 1x daily - 5x weekly  Sidestepping - 10 reps - 2 sets - 1x daily - 5x weekly  Alternating Backward Step - 10 reps - 2 sets - 1x daily - 5x weekly
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
## 2018-04-13 ENCOUNTER — Ambulatory Visit: Payer: Medicare Other | Admitting: Physical Therapy

## 2018-04-13 ENCOUNTER — Encounter: Payer: Self-pay | Admitting: Physical Therapy

## 2018-04-13 ENCOUNTER — Encounter: Payer: Self-pay | Admitting: Occupational Therapy

## 2018-04-13 ENCOUNTER — Ambulatory Visit: Payer: Medicare Other | Admitting: Occupational Therapy

## 2018-04-13 DIAGNOSIS — R41840 Attention and concentration deficit: Secondary | ICD-10-CM

## 2018-04-13 DIAGNOSIS — M6281 Muscle weakness (generalized): Secondary | ICD-10-CM

## 2018-04-13 DIAGNOSIS — R293 Abnormal posture: Secondary | ICD-10-CM

## 2018-04-13 DIAGNOSIS — M25612 Stiffness of left shoulder, not elsewhere classified: Secondary | ICD-10-CM

## 2018-04-13 DIAGNOSIS — R2681 Unsteadiness on feet: Secondary | ICD-10-CM

## 2018-04-13 DIAGNOSIS — R29898 Other symptoms and signs involving the musculoskeletal system: Secondary | ICD-10-CM

## 2018-04-13 DIAGNOSIS — R29818 Other symptoms and signs involving the nervous system: Secondary | ICD-10-CM

## 2018-04-13 DIAGNOSIS — R41844 Frontal lobe and executive function deficit: Secondary | ICD-10-CM

## 2018-04-13 DIAGNOSIS — R2689 Other abnormalities of gait and mobility: Secondary | ICD-10-CM

## 2018-04-13 DIAGNOSIS — R278 Other lack of coordination: Secondary | ICD-10-CM

## 2018-04-13 NOTE — Patient Instructions (Addendum)
Coordination Exercises  Perform the following exercises for 20 minutes 1 times per [place]. Perform with both hand(s). Perform using big movements.   Flipping Cards: Place deck of cards on the table. Flip cards over by opening your hand big to grasp and then turn your palm up big, opening hand fully to release.  Deal cards: Hold 1/2 or whole deck in your hand. Use thumb to push card off top of deck with one big push.  Rotate ball with fingertips: Pick up with fingers/thumb and move as much as you can with each turn/movement (both directions in possible).  Pick up coins and stack one at a time: Pick up with big, intentional movements. Do not drag coin to the edge. (5-10 in a stack)  Pick up 5-10 coins one at a time and hold in palm. Then, move coins from palm to fingertips one at time and place in coin bank/container.  Practice writing: Slow down, write big, and focus on forming each letter.  Practice name/address and anything else that you need to write  PWR! Hands:  Start with elbows bent and hands closed, then push your arms out by extending elbows, opening hands big, spread fingers apart, and bring wrists up/back
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
## 2018-04-19 ENCOUNTER — Encounter: Payer: Self-pay | Admitting: Physical Therapy

## 2018-04-19 ENCOUNTER — Ambulatory Visit: Payer: Medicare Other | Admitting: Physical Therapy

## 2018-04-19 ENCOUNTER — Ambulatory Visit: Payer: Medicare Other | Admitting: Occupational Therapy

## 2018-04-19 DIAGNOSIS — M6281 Muscle weakness (generalized): Secondary | ICD-10-CM

## 2018-04-19 DIAGNOSIS — R29818 Other symptoms and signs involving the nervous system: Secondary | ICD-10-CM | POA: Diagnosis not present

## 2018-04-19 DIAGNOSIS — R41840 Attention and concentration deficit: Secondary | ICD-10-CM

## 2018-04-19 DIAGNOSIS — R2689 Other abnormalities of gait and mobility: Secondary | ICD-10-CM

## 2018-04-19 DIAGNOSIS — R2681 Unsteadiness on feet: Secondary | ICD-10-CM

## 2018-04-19 DIAGNOSIS — R41844 Frontal lobe and executive function deficit: Secondary | ICD-10-CM

## 2018-04-19 DIAGNOSIS — M25612 Stiffness of left shoulder, not elsewhere classified: Secondary | ICD-10-CM

## 2018-04-19 DIAGNOSIS — R278 Other lack of coordination: Secondary | ICD-10-CM

## 2018-04-19 DIAGNOSIS — R293 Abnormal posture: Secondary | ICD-10-CM

## 2018-04-19 DIAGNOSIS — R29898 Other symptoms and signs involving the musculoskeletal system: Secondary | ICD-10-CM
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
## 2018-04-19 NOTE — Patient Instructions (Addendum)
   To put on a jacket:  Stand with your feet apart, then pull the sleeve completely on first shoulder before reaching to put on the other arm.  To fasten buttons:  Open hands big before fastening each button.  Move deliberately. To unfasten buttons:  Pick up side of shirt with right hand and pick up button with left hand.  Pull apart to open hole, then push button in hole deliberately.
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
## 2018-04-20 ENCOUNTER — Ambulatory Visit: Payer: Medicare Other | Admitting: Physical Therapy

## 2018-04-20 ENCOUNTER — Ambulatory Visit: Payer: Medicare Other | Admitting: Occupational Therapy

## 2018-04-20 ENCOUNTER — Encounter: Payer: Self-pay | Admitting: Physical Therapy

## 2018-04-20 DIAGNOSIS — R29818 Other symptoms and signs involving the nervous system: Secondary | ICD-10-CM

## 2018-04-20 DIAGNOSIS — R2681 Unsteadiness on feet: Secondary | ICD-10-CM

## 2018-04-20 DIAGNOSIS — R2689 Other abnormalities of gait and mobility: Secondary | ICD-10-CM

## 2018-04-20 DIAGNOSIS — R29898 Other symptoms and signs involving the musculoskeletal system: Secondary | ICD-10-CM

## 2018-04-20 DIAGNOSIS — M6281 Muscle weakness (generalized): Secondary | ICD-10-CM

## 2018-04-20 NOTE — Patient Instructions (Signed)
Fall prevention education provided 
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
## 2018-04-30 ENCOUNTER — Encounter: Payer: Self-pay | Admitting: Physical Therapy

## 2018-04-30 ENCOUNTER — Ambulatory Visit: Payer: Medicare Other | Admitting: Physical Therapy

## 2018-04-30 ENCOUNTER — Ambulatory Visit: Payer: Medicare Other | Admitting: Occupational Therapy

## 2018-04-30 DIAGNOSIS — R41844 Frontal lobe and executive function deficit: Secondary | ICD-10-CM

## 2018-04-30 DIAGNOSIS — R29898 Other symptoms and signs involving the musculoskeletal system: Secondary | ICD-10-CM

## 2018-04-30 DIAGNOSIS — R2689 Other abnormalities of gait and mobility: Secondary | ICD-10-CM

## 2018-04-30 DIAGNOSIS — R29818 Other symptoms and signs involving the nervous system: Secondary | ICD-10-CM | POA: Diagnosis not present

## 2018-04-30 DIAGNOSIS — M6281 Muscle weakness (generalized): Secondary | ICD-10-CM

## 2018-04-30 DIAGNOSIS — R2681 Unsteadiness on feet: Secondary | ICD-10-CM

## 2018-04-30 DIAGNOSIS — R278 Other lack of coordination: Secondary | ICD-10-CM
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
## 2018-05-03 ENCOUNTER — Ambulatory Visit: Payer: Medicare Other | Admitting: Occupational Therapy

## 2018-05-03 ENCOUNTER — Ambulatory Visit: Payer: Medicare Other | Admitting: Physical Therapy

## 2018-05-03 ENCOUNTER — Ambulatory Visit: Payer: Medicare Other | Admitting: Neurology

## 2018-05-03 ENCOUNTER — Encounter: Payer: Self-pay | Admitting: Occupational Therapy

## 2018-05-03 ENCOUNTER — Encounter: Payer: Self-pay | Admitting: Neurology

## 2018-05-03 ENCOUNTER — Encounter: Payer: Self-pay | Admitting: Physical Therapy

## 2018-05-03 VITALS — BP 110/76 | HR 56 | Ht 73.0 in | Wt 192.0 lb

## 2018-05-03 DIAGNOSIS — M25612 Stiffness of left shoulder, not elsewhere classified: Secondary | ICD-10-CM

## 2018-05-03 DIAGNOSIS — R350 Frequency of micturition: Secondary | ICD-10-CM

## 2018-05-03 DIAGNOSIS — R2681 Unsteadiness on feet: Secondary | ICD-10-CM

## 2018-05-03 DIAGNOSIS — R41840 Attention and concentration deficit: Secondary | ICD-10-CM

## 2018-05-03 DIAGNOSIS — R29898 Other symptoms and signs involving the musculoskeletal system: Secondary | ICD-10-CM

## 2018-05-03 DIAGNOSIS — R2689 Other abnormalities of gait and mobility: Secondary | ICD-10-CM

## 2018-05-03 DIAGNOSIS — R278 Other lack of coordination: Secondary | ICD-10-CM

## 2018-05-03 DIAGNOSIS — M6281 Muscle weakness (generalized): Secondary | ICD-10-CM

## 2018-05-03 DIAGNOSIS — R293 Abnormal posture: Secondary | ICD-10-CM

## 2018-05-03 DIAGNOSIS — G20 Parkinson's disease: Secondary | ICD-10-CM

## 2018-05-03 DIAGNOSIS — R441 Visual hallucinations: Secondary | ICD-10-CM | POA: Diagnosis not present

## 2018-05-03 DIAGNOSIS — R41844 Frontal lobe and executive function deficit: Secondary | ICD-10-CM

## 2018-05-03 DIAGNOSIS — R29818 Other symptoms and signs involving the nervous system: Secondary | ICD-10-CM

## 2018-05-03 MED ORDER — PIMAVANSERIN TARTRATE 34 MG PO CAPS: 34.0000 mg | ORAL_CAPSULE | Freq: Every day | ORAL | 0 refills | Status: DC
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
## 2018-05-03 NOTE — Patient Instructions (Signed)
You are looking great!

## 2018-05-03 NOTE — Addendum Note (Signed)
Addended byAnnamaria Helling on: 05/03/2018 09:49 AM   Modules accepted: Orders
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
## 2018-05-05 ENCOUNTER — Ambulatory Visit: Payer: Medicare Other | Admitting: Neurology

## 2018-05-06 ENCOUNTER — Ambulatory Visit: Payer: Medicare Other | Admitting: Occupational Therapy

## 2018-05-06 ENCOUNTER — Encounter: Payer: Self-pay | Admitting: Occupational Therapy

## 2018-05-06 ENCOUNTER — Ambulatory Visit: Payer: Medicare Other | Attending: Internal Medicine | Admitting: Physical Therapy

## 2018-05-06 ENCOUNTER — Encounter: Payer: Self-pay | Admitting: Physical Therapy

## 2018-05-06 DIAGNOSIS — R278 Other lack of coordination: Secondary | ICD-10-CM | POA: Insufficient documentation

## 2018-05-06 DIAGNOSIS — R2689 Other abnormalities of gait and mobility: Secondary | ICD-10-CM | POA: Diagnosis present

## 2018-05-06 DIAGNOSIS — R41844 Frontal lobe and executive function deficit: Secondary | ICD-10-CM | POA: Insufficient documentation

## 2018-05-06 DIAGNOSIS — M6281 Muscle weakness (generalized): Secondary | ICD-10-CM | POA: Insufficient documentation

## 2018-05-06 DIAGNOSIS — R41840 Attention and concentration deficit: Secondary | ICD-10-CM | POA: Insufficient documentation

## 2018-05-06 DIAGNOSIS — R29898 Other symptoms and signs involving the musculoskeletal system: Secondary | ICD-10-CM

## 2018-05-06 DIAGNOSIS — R2681 Unsteadiness on feet: Secondary | ICD-10-CM

## 2018-05-06 DIAGNOSIS — R29818 Other symptoms and signs involving the nervous system: Secondary | ICD-10-CM | POA: Insufficient documentation

## 2018-05-06 DIAGNOSIS — M25612 Stiffness of left shoulder, not elsewhere classified: Secondary | ICD-10-CM | POA: Diagnosis present

## 2018-05-06 DIAGNOSIS — R293 Abnormal posture: Secondary | ICD-10-CM
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
## 2018-05-10 ENCOUNTER — Ambulatory Visit: Payer: Medicare Other | Admitting: Occupational Therapy

## 2018-05-10 ENCOUNTER — Encounter: Payer: Self-pay | Admitting: Physical Therapy

## 2018-05-10 ENCOUNTER — Ambulatory Visit: Payer: Medicare Other | Admitting: Physical Therapy

## 2018-05-10 VITALS — BP 123/68

## 2018-05-10 DIAGNOSIS — R293 Abnormal posture: Secondary | ICD-10-CM

## 2018-05-10 DIAGNOSIS — R2689 Other abnormalities of gait and mobility: Secondary | ICD-10-CM

## 2018-05-10 DIAGNOSIS — M6281 Muscle weakness (generalized): Secondary | ICD-10-CM

## 2018-05-10 DIAGNOSIS — R2681 Unsteadiness on feet: Secondary | ICD-10-CM | POA: Diagnosis not present

## 2018-05-10 NOTE — Patient Instructions (Signed)
Access Code: [email or web address]  URL: [email or web address]  Date: 05/10/2018  Prepared by: Bjorn Loser   Exercises  Heel Toe Raises with Counter Support - 10 reps - 2 sets - 3 sec hold - 1x daily - 5x weekly  Alternating Step Forward with Support - 10 reps - 2 sets - 1x daily - 5x weekly  Sidestepping - 10 reps - 2 sets - 1x daily - 5x weekly  Alternating Backward Step - 10 reps - 2 sets - 1x daily - 5x weekly   Added below 05/10/18 Supine Straight Leg Raises - 10 reps - 1 sets - 1-2x daily - 5x weekly  Supine Bridge - 10 reps - 1-2 sets - 1-2x daily - 5x weekly  Sit to Stand without Arm Support - 10 reps - 1-2 sets - 1-2x daily - 5x weekly
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
## 2018-05-12 ENCOUNTER — Ambulatory Visit: Payer: Medicare Other | Admitting: Physical Therapy

## 2018-05-12 ENCOUNTER — Encounter: Payer: Self-pay | Admitting: Physical Therapy

## 2018-05-12 ENCOUNTER — Encounter: Payer: Self-pay | Admitting: Occupational Therapy

## 2018-05-12 ENCOUNTER — Ambulatory Visit: Payer: Medicare Other | Admitting: Occupational Therapy

## 2018-05-12 DIAGNOSIS — R2689 Other abnormalities of gait and mobility: Secondary | ICD-10-CM

## 2018-05-12 DIAGNOSIS — R278 Other lack of coordination: Secondary | ICD-10-CM

## 2018-05-12 DIAGNOSIS — R41844 Frontal lobe and executive function deficit: Secondary | ICD-10-CM

## 2018-05-12 DIAGNOSIS — R29898 Other symptoms and signs involving the musculoskeletal system: Secondary | ICD-10-CM

## 2018-05-12 DIAGNOSIS — R2681 Unsteadiness on feet: Secondary | ICD-10-CM

## 2018-05-12 DIAGNOSIS — R293 Abnormal posture: Secondary | ICD-10-CM

## 2018-05-12 DIAGNOSIS — M6281 Muscle weakness (generalized): Secondary | ICD-10-CM

## 2018-05-12 DIAGNOSIS — R29818 Other symptoms and signs involving the nervous system: Secondary | ICD-10-CM
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
## 2018-05-19 ENCOUNTER — Ambulatory Visit: Payer: Medicare Other | Admitting: Occupational Therapy

## 2018-05-19 ENCOUNTER — Encounter: Payer: Self-pay | Admitting: Physical Therapy

## 2018-05-19 ENCOUNTER — Ambulatory Visit: Payer: Medicare Other | Admitting: Physical Therapy

## 2018-05-19 ENCOUNTER — Encounter: Payer: Self-pay | Admitting: Occupational Therapy

## 2018-05-19 DIAGNOSIS — R41840 Attention and concentration deficit: Secondary | ICD-10-CM

## 2018-05-19 DIAGNOSIS — R2689 Other abnormalities of gait and mobility: Secondary | ICD-10-CM

## 2018-05-19 DIAGNOSIS — R29818 Other symptoms and signs involving the nervous system: Secondary | ICD-10-CM

## 2018-05-19 DIAGNOSIS — R2681 Unsteadiness on feet: Secondary | ICD-10-CM

## 2018-05-19 DIAGNOSIS — R29898 Other symptoms and signs involving the musculoskeletal system: Secondary | ICD-10-CM

## 2018-05-19 DIAGNOSIS — R293 Abnormal posture: Secondary | ICD-10-CM

## 2018-05-19 DIAGNOSIS — R41844 Frontal lobe and executive function deficit: Secondary | ICD-10-CM

## 2018-05-19 DIAGNOSIS — R278 Other lack of coordination: Secondary | ICD-10-CM

## 2018-05-19 DIAGNOSIS — M6281 Muscle weakness (generalized): Secondary | ICD-10-CM

## 2018-05-19 DIAGNOSIS — M25612 Stiffness of left shoulder, not elsewhere classified: Secondary | ICD-10-CM
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
## 2018-05-21 ENCOUNTER — Encounter: Payer: Self-pay | Admitting: Physical Therapy

## 2018-05-21 ENCOUNTER — Ambulatory Visit: Payer: Medicare Other | Admitting: Physical Therapy

## 2018-05-21 ENCOUNTER — Ambulatory Visit: Payer: Medicare Other | Admitting: Occupational Therapy

## 2018-05-21 DIAGNOSIS — R29818 Other symptoms and signs involving the nervous system: Secondary | ICD-10-CM

## 2018-05-21 DIAGNOSIS — R2689 Other abnormalities of gait and mobility: Secondary | ICD-10-CM

## 2018-05-21 DIAGNOSIS — R2681 Unsteadiness on feet: Secondary | ICD-10-CM

## 2018-05-21 DIAGNOSIS — R293 Abnormal posture: Secondary | ICD-10-CM

## 2018-05-21 DIAGNOSIS — R41844 Frontal lobe and executive function deficit: Secondary | ICD-10-CM

## 2018-05-21 DIAGNOSIS — R278 Other lack of coordination: Secondary | ICD-10-CM

## 2018-05-21 DIAGNOSIS — R29898 Other symptoms and signs involving the musculoskeletal system: Secondary | ICD-10-CM
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
## 2018-05-21 NOTE — Addendum Note (Signed)
Addended by: Frazier Butt on: 05/21/2018 02:27 PM   Modules accepted: Orders
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
## 2018-05-24 ENCOUNTER — Encounter: Payer: Self-pay | Admitting: Occupational Therapy

## 2018-05-24 ENCOUNTER — Encounter: Payer: Self-pay | Admitting: Physical Therapy

## 2018-05-24 ENCOUNTER — Ambulatory Visit: Payer: Medicare Other | Admitting: Physical Therapy

## 2018-05-24 ENCOUNTER — Ambulatory Visit: Payer: Medicare Other | Admitting: Occupational Therapy

## 2018-05-24 DIAGNOSIS — R29818 Other symptoms and signs involving the nervous system: Secondary | ICD-10-CM

## 2018-05-24 DIAGNOSIS — R293 Abnormal posture: Secondary | ICD-10-CM

## 2018-05-24 DIAGNOSIS — M6281 Muscle weakness (generalized): Secondary | ICD-10-CM

## 2018-05-24 DIAGNOSIS — R41844 Frontal lobe and executive function deficit: Secondary | ICD-10-CM

## 2018-05-24 DIAGNOSIS — R278 Other lack of coordination: Secondary | ICD-10-CM

## 2018-05-24 DIAGNOSIS — R2689 Other abnormalities of gait and mobility: Secondary | ICD-10-CM

## 2018-05-24 DIAGNOSIS — R2681 Unsteadiness on feet: Secondary | ICD-10-CM

## 2018-05-24 DIAGNOSIS — R29898 Other symptoms and signs involving the musculoskeletal system: Secondary | ICD-10-CM

## 2018-05-24 DIAGNOSIS — M25612 Stiffness of left shoulder, not elsewhere classified: Secondary | ICD-10-CM

## 2018-05-24 DIAGNOSIS — R41840 Attention and concentration deficit: Secondary | ICD-10-CM
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
## 2018-05-26 ENCOUNTER — Ambulatory Visit: Payer: Medicare Other | Admitting: Rehabilitative and Restorative Service Providers"

## 2018-05-26 ENCOUNTER — Encounter: Payer: Self-pay | Admitting: Rehabilitative and Restorative Service Providers"

## 2018-05-26 ENCOUNTER — Ambulatory Visit: Payer: Medicare Other | Admitting: Occupational Therapy

## 2018-05-26 DIAGNOSIS — M6281 Muscle weakness (generalized): Secondary | ICD-10-CM

## 2018-05-26 DIAGNOSIS — R29818 Other symptoms and signs involving the nervous system: Secondary | ICD-10-CM

## 2018-05-26 DIAGNOSIS — R29898 Other symptoms and signs involving the musculoskeletal system: Secondary | ICD-10-CM

## 2018-05-26 DIAGNOSIS — R41844 Frontal lobe and executive function deficit: Secondary | ICD-10-CM

## 2018-05-26 DIAGNOSIS — R293 Abnormal posture: Secondary | ICD-10-CM

## 2018-05-26 DIAGNOSIS — R2689 Other abnormalities of gait and mobility: Secondary | ICD-10-CM

## 2018-05-26 DIAGNOSIS — R2681 Unsteadiness on feet: Secondary | ICD-10-CM
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
## 2018-05-31 ENCOUNTER — Ambulatory Visit: Payer: Medicare Other | Admitting: Physical Therapy

## 2018-05-31 ENCOUNTER — Ambulatory Visit: Payer: Medicare Other | Admitting: Occupational Therapy

## 2018-05-31 ENCOUNTER — Encounter: Payer: Self-pay | Admitting: Occupational Therapy

## 2018-05-31 ENCOUNTER — Encounter: Payer: Self-pay | Admitting: Physical Therapy

## 2018-05-31 DIAGNOSIS — R2689 Other abnormalities of gait and mobility: Secondary | ICD-10-CM

## 2018-05-31 DIAGNOSIS — R2681 Unsteadiness on feet: Secondary | ICD-10-CM

## 2018-05-31 DIAGNOSIS — R29898 Other symptoms and signs involving the musculoskeletal system: Secondary | ICD-10-CM

## 2018-05-31 DIAGNOSIS — M6281 Muscle weakness (generalized): Secondary | ICD-10-CM

## 2018-05-31 DIAGNOSIS — M25612 Stiffness of left shoulder, not elsewhere classified: Secondary | ICD-10-CM

## 2018-05-31 DIAGNOSIS — R293 Abnormal posture: Secondary | ICD-10-CM

## 2018-05-31 DIAGNOSIS — R41844 Frontal lobe and executive function deficit: Secondary | ICD-10-CM

## 2018-05-31 DIAGNOSIS — R29818 Other symptoms and signs involving the nervous system: Secondary | ICD-10-CM

## 2018-05-31 DIAGNOSIS — R278 Other lack of coordination: Secondary | ICD-10-CM

## 2018-05-31 DIAGNOSIS — R41840 Attention and concentration deficit: Secondary | ICD-10-CM
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
## 2018-06-01 ENCOUNTER — Ambulatory Visit (INDEPENDENT_AMBULATORY_CARE_PROVIDER_SITE_OTHER): Payer: Medicare Other

## 2018-06-01 DIAGNOSIS — Z23 Encounter for immunization: Secondary | ICD-10-CM

## 2018-06-02 ENCOUNTER — Ambulatory Visit: Payer: Medicare Other | Admitting: Physical Therapy

## 2018-06-02 ENCOUNTER — Ambulatory Visit: Payer: Medicare Other | Admitting: Occupational Therapy

## 2018-06-02 DIAGNOSIS — R2681 Unsteadiness on feet: Secondary | ICD-10-CM

## 2018-06-02 DIAGNOSIS — R29818 Other symptoms and signs involving the nervous system: Secondary | ICD-10-CM

## 2018-06-02 DIAGNOSIS — R41844 Frontal lobe and executive function deficit: Secondary | ICD-10-CM

## 2018-06-02 DIAGNOSIS — M6281 Muscle weakness (generalized): Secondary | ICD-10-CM

## 2018-06-02 DIAGNOSIS — R2689 Other abnormalities of gait and mobility: Secondary | ICD-10-CM

## 2018-06-02 DIAGNOSIS — R278 Other lack of coordination: Secondary | ICD-10-CM

## 2018-06-02 DIAGNOSIS — R29898 Other symptoms and signs involving the musculoskeletal system: Secondary | ICD-10-CM
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
## 2018-06-21 ENCOUNTER — Ambulatory Visit (INDEPENDENT_AMBULATORY_CARE_PROVIDER_SITE_OTHER): Payer: Medicare Other | Admitting: Neurology

## 2018-06-21 ENCOUNTER — Encounter: Payer: Self-pay | Admitting: Neurology

## 2018-06-21 VITALS — BP 130/90 | HR 58 | Ht 73.0 in | Wt 193.5 lb

## 2018-06-21 DIAGNOSIS — G7000 Myasthenia gravis without (acute) exacerbation: Secondary | ICD-10-CM

## 2018-06-21 MED ORDER — PYRIDOSTIGMINE BROMIDE 60 MG PO TABS: ORAL_TABLET | ORAL | 3 refills | Status: DC

## 2018-06-21 NOTE — Progress Notes (Signed)
Follow-up Visit   Date: 06/21/18    Derek Harrell MRN: [id number] DOB: 1945/12/31   Interim History: Derek Harrell is a 72 y.o. right-handed African American male with parkinsonism, hyperlipidemia, stroke, and hypertension here for follow-up of myasthenia gravis.  The patient was accompanied to the clinic by wife.    History of present illness: He reports having droopy eyelids since birth, which is worse on the right.  These symptoms have remained constant without worsening in the evening or with fatigue.  He was doing well until 2017, when he started having spells of double vision.  He was being followed by Dr. [name] at [facility] who checked AChR antibodies which returned positive.  CT chest was negative for thymoma.  Because he was asympomatic from [facility] he was not started on any medications.  He is  followed by parkinsonism and referred to Dr. Carles Collet for a second opinion who has referred the patient for my opinion on myasthenia.   He endorses generalized weakness, especially of the legs upon getting out of bed in the morning.  UPDATE 03/20/2017:  Here is here for follow-up of MG.  At his last visit, he was started on a trial of mestinon '60mg'$  BID and has noticed that his leg are stronger and buckling much less.  He does not notice double vision as much any more either, only when extremely tired.  He denies any difficulty swallowing or talking. No shortness of breath.  He denies arm weakness.  He has started to exercise 20-min twice daily with his wife and is not getting tired.   UPDATE 06/21/2018:  He is here for follow-up visit.  He has not had any problems with double vision, dysarthria, or weakness of the arms and legs.  He takes mestinon '60mg'$  at 8am and 4pm.  No breakthrough symptoms and he has not needed to take extra dose.   He suffered a fall in June after slipping on the ground resulting in fractured left humerus.  He has completed out-patient OT and PT and is enrolled in Parkinson's exercise program.   No new complaints.     Medications:  Current Outpatient Medications on File Prior to Visit  Medication Sig Dispense Refill  . AMITIZA 24 MCG capsule     . aspirin 81 MG EC tablet Take 1 tablet (81 mg total) by mouth daily. Swallow whole. 30 tablet 12  . carbidopa-levodopa (SINEMET CR) 50-200 MG tablet Take 1 tablet by mouth at bedtime. 90 tablet 1  . carbidopa-levodopa (SINEMET IR) 25-100 MG tablet Take 2 tablets by mouth 4 (four) times daily. 720 tablet 1  . clopidogrel (PLAVIX) 75 MG tablet Take 75 mg by mouth daily.    Marland Kitchen ipratropium (ATROVENT) 0.06 % nasal spray PLACE 2 SPRAYS IN EACH NOSTRIL FOUR TIMES DAILY FOR NASAL CONGESTION 15 mL 0  . mirabegron ER (MYRBETRIQ) 50 MG TB24 tablet Take 1 tablet (50 mg total) by mouth daily. 90 tablet 3  . nebivolol (BYSTOLIC) 10 MG tablet Take 10 mg by mouth daily.    Debby Freiberg Tartrate (NUPLAZID) 34 MG CAPS Take 34 mg by mouth daily. 14 capsule 0  . rosuvastatin (CRESTOR) 10 MG tablet Take 1 tablet (10 mg total) by mouth daily. 90 tablet 3  . vitamin B-12 (CYANOCOBALAMIN) 1000 MCG tablet Take 1,000 mcg by mouth daily.     No current facility-administered medications on file prior to visit.     Allergies: No Known Allergies  Review of Systems:  CONSTITUTIONAL: No  fevers, chills, night sweats, or weight loss.  EYES: No visual changes or eye pain ENT: No hearing changes.  No history of nose bleeds.   RESPIRATORY: No cough, wheezing and shortness of breath.   CARDIOVASCULAR: Negative for chest pain, and palpitations.   GI: Negative for abdominal discomfort, blood in stools or black stools.  No recent change in bowel habits.   GU:  No history of incontinence.   MUSCLOSKELETAL: No history of joint pain or swelling.  No myalgias.   SKIN: Negative for lesions, rash, and itching.   ENDOCRINE: Negative for cold or heat intolerance, polydipsia or goiter.   PSYCH:  No depression or anxiety symptoms.   NEURO: As Above.   Vital Signs:  BP 130/90    Pulse (!) 58   Ht '6\' 1"'$  (1.854 m)   Wt 193 lb 8 oz (87.8 kg)   SpO2 97%   BMI 25.53 kg/m    General Medical Exam:   General:  Well appearing, comfortable  Eyes/ENT: see cranial nerve examination.   Neck:  No carotid bruits. Respiratory:  Clear to auscultation, good air entry bilaterally.   Cardiac:  Regular rate and rhythm, no murmur.   Ext:  No edema  Neurological Exam: MENTAL STATUS including orientation to time, place, person, recent and remote memory, attention span and concentration, language, and fund of knowledge is normal.  Speech is not dysarthric.  CRANIAL NERVES:  Pupils equal round and reactive to light.  Normal conjugate, extra-ocular eye movements in all directions of gaze.  Moderate ptosis bilaterally, worse on the right where eyelid covers 50% of the upper pupil.  Face is symmetric. Facial muscles are intact - orbicularis oculi, buccinator, and orbicularis oris is 5/5. Palate elevates symmetrically.  Tongue is midline and strength is 5/5.  MOTOR:  Motor strength is 5/5 in all extremities, including bilateral hip flexion.  Neck flexion and extension is 5/5  COORDINATION/GAIT:  Gait slightly wide-based, assisted with cane.   Data: MRI brain wo contrast 09/09/2015: 1.  No acute intracranial abnormality. 2. 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 No progression identified since 2016.   NCS/EMG of the right side 09/30/2016: The electrophysiologic findings are of suggestive of postsynaptic neuromuscular junction disorder. 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  Labs 09/13/2015:  AChR binding 0.47*, TSH 1.08, ESR 2, ACE 34, ANA neg  IMPRESSION/PLAN 1.  Seropositive generalized myasthenia gravis without exacerbation.  Exam remains stable with congenital ptosis, worse on the right, parkinsonian features, no diplopia, bulbar or limb weakness.  I will keep him on mestinon  '60mg'$  BID at 8am and 4pm - he can take an extra dose as needed.  He has not required immunosuppressive medications.   2.  Parkinson's disease, followed by Dr. Carles Collet.    Return to clinic in 9 months   Thank you for allowing me to participate in patient's care.  If I can answer any additional questions, I would be pleased to do so.    Sincerely,    Lamarr Feenstra K. Posey Pronto, DO

## 2018-06-21 NOTE — Patient Instructions (Addendum)
It was great to see you today!  Continue pyridostigmine 60mg  at 8am and 4pm  Return to clinic in 9 months

## 2018-06-25 ENCOUNTER — Ambulatory Visit: Payer: Medicare Other | Admitting: Neurology

## 2018-07-13 ENCOUNTER — Encounter: Payer: Self-pay | Admitting: Internal Medicine

## 2018-07-13 ENCOUNTER — Other Ambulatory Visit (INDEPENDENT_AMBULATORY_CARE_PROVIDER_SITE_OTHER): Payer: Medicare Other

## 2018-07-13 ENCOUNTER — Ambulatory Visit: Payer: Medicare Other | Admitting: Internal Medicine

## 2018-07-13 ENCOUNTER — Other Ambulatory Visit: Payer: Self-pay | Admitting: Neurology

## 2018-07-13 VITALS — BP 130/82 | HR 59 | Temp 97.9°F | Ht 73.0 in | Wt 190.0 lb

## 2018-07-13 DIAGNOSIS — Z0000 Encounter for general adult medical examination without abnormal findings: Secondary | ICD-10-CM

## 2018-07-13 DIAGNOSIS — R32 Unspecified urinary incontinence: Secondary | ICD-10-CM | POA: Diagnosis not present

## 2018-07-13 DIAGNOSIS — G20 Parkinson's disease: Secondary | ICD-10-CM | POA: Diagnosis not present

## 2018-07-13 DIAGNOSIS — J3089 Other allergic rhinitis: Secondary | ICD-10-CM

## 2018-07-13 LAB — LIPID PANEL
CHOL/HDL RATIO: 2
Cholesterol: 141 mg/dL (ref 0–200)
HDL: 57.8 mg/dL (ref >39.00)
LDL Cholesterol: 68 mg/dL (ref 0–99)
NonHDL: 83.53
Triglycerides: 76 mg/dL (ref 0.0–149.0)
VLDL: 15.2 mg/dL (ref 0.0–40.0)

## 2018-07-13 LAB — CBC
HEMATOCRIT: 43 % (ref 39.0–52.0)
HEMOGLOBIN: 14.4 g/dL (ref 13.0–17.0)
MCHC: 33.6 g/dL (ref 30.0–36.0)
MCV: 81.2 fl (ref 78.0–100.0)
PLATELETS: 157 10*3/uL (ref 150.0–400.0)
RBC: 5.3 Mil/uL (ref 4.22–5.81)
RDW: 14.6 % (ref 11.5–15.5)
WBC: 7.5 10*3/uL (ref 4.0–10.5)

## 2018-07-13 LAB — COMPREHENSIVE METABOLIC PANEL
ALK PHOS: 49 U/L (ref 39–117)
ALT: <3 U/L (ref 0–53)
AST: 15 U/L (ref 0–37)
Albumin: 4 g/dL (ref 3.5–5.2)
BUN: 16 mg/dL (ref 6–23)
CO2: 23 mEq/L (ref 19–32)
Calcium: 8.8 mg/dL (ref 8.4–10.5)
Chloride: 108 mEq/L (ref 96–112)
Creatinine, Ser: 0.89 mg/dL (ref 0.40–1.50)
GFR: 108.42 mL/min (ref >60.00)
Glucose, Bld: 108 mg/dL — ABNORMAL HIGH (ref 70–99)
POTASSIUM: 4.1 meq/L (ref 3.5–5.1)
Sodium: 143 mEq/L (ref 135–145)
TOTAL PROTEIN: 6.7 g/dL (ref 6.0–8.3)
Total Bilirubin: 1.1 mg/dL (ref 0.2–1.2)

## 2018-07-13 LAB — VITAMIN B12: Vitamin B-12: 717 pg/mL (ref 211–911)

## 2018-07-13 NOTE — Progress Notes (Signed)
   Subjective:    Patient ID: Derek Harrell, male    DOB: 1945/09/14, 72 y.o.   MRN: [id number]  HPI The patient is a 72 YO man coming in for several concerns including lump in nose (started a little bit ago, not sure if weeks, in the night and morning his nose gets very clogged and feels like it is bulging out, he is blowing and wiping in the morning and had been using q-tips to clean nose, saw some blood on q tip and decided to stop that, denies nosebleed, denies fevers or chills, denies sinus pressure) and bladder problems (taking myrbetriq and cannot notice much difference with it, during the [place] frequent urination is not bothersome, he is worried about night time due to his imbalance due to parkinson's, has not fallen, they have put in night lights and he uses assistive device in the night, denies burning with urination or change in frequency recently) and parkinson's (balance problems and they recently started a new medication for this which they are not sure is helping, they may be having some side effects from this, denies falls recently).   Review of Systems  Constitutional: Negative.   HENT: Positive for congestion and rhinorrhea. Negative for ear discharge, ear pain, facial swelling, nosebleeds, postnasal drip, sinus pressure, sinus pain, sore throat, trouble swallowing and voice change.   Eyes: Negative.   Respiratory: Negative for cough, chest tightness and shortness of breath.   Cardiovascular: Negative for chest pain, palpitations and leg swelling.  Gastrointestinal: Negative for abdominal distention, abdominal pain, constipation, diarrhea, nausea and vomiting.  Genitourinary: Positive for enuresis, frequency and urgency. Negative for difficulty urinating, dysuria and flank pain.  Musculoskeletal: Positive for gait problem. Negative for arthralgias, back pain, myalgias, neck pain and neck stiffness.  Skin: Negative.   Neurological: Positive for dizziness. Negative for tremors, facial  asymmetry, speech difficulty, weakness, numbness and headaches.  Psychiatric/Behavioral: Negative.       Objective:   Physical Exam  Constitutional: He is oriented to person, place, and time. He appears well-developed and well-nourished.  HENT:  Head: Normocephalic and atraumatic.  Nose with significant hair at the entrance and then fairly normal, some mild turbinate swelling on the left nostril more than right, no ulcer or exposed blood vessel.   Eyes: EOM are normal.  Neck: Normal range of motion.  Cardiovascular: Normal rate and regular rhythm.  Pulmonary/Chest: Effort normal and breath sounds normal. No respiratory distress. He has no wheezes. He has no rales.  Abdominal: Soft. Bowel sounds are normal. He exhibits no distension. There is no tenderness. There is no rebound.  Musculoskeletal: He exhibits no edema.  Neurological: He is alert and oriented to person, place, and time. Coordination abnormal.  Cane  Skin: Skin is warm and dry.  Psychiatric: He has a normal mood and affect.   Vitals:   07/13/18 1412  BP: 130/82  Pulse: (!) 59  Temp: 97.9 F (36.6 C)  TempSrc: Oral  SpO2: 96%  Weight: 190 lb (86.2 kg)  Height: 6\' 1"  (1.854 m)      Assessment & Plan:

## 2018-07-13 NOTE — Patient Instructions (Signed)
There are other medicines we can try for the bladder but they do have side effects. I would rather try keeping a urinal by the bedside at night so you do not have to get up to go to the bathroom.

## 2018-07-14 DIAGNOSIS — J309 Allergic rhinitis, unspecified: Secondary | ICD-10-CM | POA: Insufficient documentation

## 2018-07-14 NOTE — Assessment & Plan Note (Signed)
Asked to start taking zyrtec daily for symptoms. No signs of sinus infection on exam. No indication for steroids or antibiotics. No ulcer or mass appreciated on exam and likely the blood was traumatic from q-tip cleaning.

## 2018-07-14 NOTE — Assessment & Plan Note (Signed)
Will stop myrbetriq as it is not helpful. He is asked to use urinal at bedside in the middle of the night for safety. We discuss alternative medications but given his parkinson's the risk of side effects involving balance is very high and we do not feel it justifies benefit as he is able to fall asleep easily after arousal to urinate.

## 2018-07-14 NOTE — Assessment & Plan Note (Signed)
Asked them to follow up with their neurologist and ask about side effects they feel he is having. Encouraged not to stop regimen abruptly.

## 2018-07-18 ENCOUNTER — Other Ambulatory Visit: Payer: Self-pay | Admitting: Internal Medicine

## 2018-07-19 ENCOUNTER — Telehealth: Payer: Self-pay | Admitting: Neurology

## 2018-07-19 NOTE — Telephone Encounter (Signed)
Nuplazid approved valid until 08/04/2019.

## 2018-08-09 ENCOUNTER — Other Ambulatory Visit: Payer: Self-pay | Admitting: Neurology

## 2018-08-23 ENCOUNTER — Ambulatory Visit: Payer: Self-pay

## 2018-08-23 NOTE — Telephone Encounter (Signed)
Returned call to patient who states that he is having frequency problems Per wife he is up at least 3 times each night. Pt is requesting to restart his Mybetriq. Pt wife states that he was taken off because he felt it was not making a difference but would like to try it again. Pt refused appointment and instead is requesting a call back. Wife states that they have some of the medication available to get started but need approval to restart. follow up appointment scheduled per pt wife request for February 17th  Reason for Disposition . Has to get out of bed to urinate > 2 times a night (i.e., nocturia)  Answer Assessment - Initial Assessment Questions 1. SYMPTOM: "What's the main symptom you're concerned about?" (e.g., frequency, incontinence)     frequency 2. ONSET: "When did the  frequency  start?"    every 3hour urinating 3. PAIN: "Is there any pain?" If so, ask: "How bad is it?" (Scale: 1-10; mild, moderate, severe)     no 4. CAUSE: "What do you think is causing the symptoms?"     frequency 5. OTHER SYMPTOMS: "Do you have any other symptoms?" (e.g., fever, flank pain, blood in urine, pain with urination)     no 6. PREGNANCY: "Is there any chance you are pregnant?" "When was your last menstrual period?"     N/A  Protocols used: URINARY [facility]

## 2018-08-23 NOTE — Telephone Encounter (Signed)
Called patients wife back to inform her that I can send the message over but Dr. Sharlet Salina wont be back till tomorrow. Patients wife stated understanding

## 2018-08-24 MED ORDER — MIRABEGRON ER 50 MG PO TB24: 50.0000 mg | ORAL_TABLET | Freq: Every day | ORAL | 3 refills | Status: DC

## 2018-08-24 NOTE — Addendum Note (Signed)
Addended by: Pricilla Holm A on: 08/24/2018 07:59 AM   Modules accepted: Orders

## 2018-08-24 NOTE — Telephone Encounter (Signed)
Patients wife informed and stated understanding

## 2018-08-24 NOTE — Telephone Encounter (Signed)
Sent in refill of myrbetriq and it is safe to resume.

## 2018-09-07 ENCOUNTER — Telehealth: Payer: Self-pay | Admitting: Internal Medicine

## 2018-09-07 NOTE — Telephone Encounter (Signed)
Do they need to make an appointment to discuss

## 2018-09-07 NOTE — Telephone Encounter (Signed)
Copied from [place] [id number]. Topic: General - Other >> Sep 07, 2018  4:06 PM Leward Quan A wrote: Reason for CRM: Patient wife called to request some help in finding someone to help her husband when she is not at home. She would like to know what the process is starting on her part and if there are any recommendations that the doctor can give. Please advise call back at Ph# [phone]

## 2018-09-08 NOTE — Telephone Encounter (Signed)
All of these groups are private and take private pay. There are many in the area that she can check out including visiting angels, a place for mom that all come in and help to care for someone or do tasks to help.

## 2018-09-08 NOTE — Telephone Encounter (Signed)
Patients wife informed of MD response and gave number of the visiting angels

## 2018-09-10 ENCOUNTER — Ambulatory Visit (HOSPITAL_COMMUNITY)
Admission: RE | Admit: 2018-09-10 | Payer: Medicare Other | Source: Ambulatory Visit | Attending: Cardiovascular Disease | Admitting: Cardiovascular Disease

## 2018-09-10 DIAGNOSIS — R0989 Other specified symptoms and signs involving the circulatory and respiratory systems: Secondary | ICD-10-CM

## 2018-09-20 ENCOUNTER — Encounter: Payer: Self-pay | Admitting: Internal Medicine

## 2018-09-20 ENCOUNTER — Ambulatory Visit: Payer: Medicare Other | Admitting: Internal Medicine

## 2018-09-20 DIAGNOSIS — R32 Unspecified urinary incontinence: Secondary | ICD-10-CM

## 2018-09-20 MED ORDER — TAMSULOSIN HCL 0.4 MG PO CAPS: 0.4000 mg | ORAL_CAPSULE | Freq: Every day | ORAL | 1 refills | Status: DC

## 2018-09-20 NOTE — Progress Notes (Signed)
   Subjective:   Patient ID: Derek Harrell, male    DOB: 12/25/1945, 73 y.o.   MRN: [id number]  HPI The patient is a 73 YO man coming in for concerns about urinary frequency. Is still taking myrbetriq but does not feel it is working very well and he is having a high out of pocket cost for this. He has seen someone else for this and they tried oxybutynin. This did give him hallucinations. He has concurrent parkinson's and this class is not a good option for him. Denies burning with urination or significant change in urgency recently. Getting up every 3 hours at night but able to fall back asleep easily. He is alone during the [place] which can be a problem if he is not feeling stable.   PCS   Review of Systems  Constitutional: Positive for activity change.  HENT: Negative.   Respiratory: Negative for cough, chest tightness and shortness of breath.   Cardiovascular: Negative for chest pain, palpitations and leg swelling.  Gastrointestinal: Negative for abdominal distention, abdominal pain, constipation, diarrhea, nausea and vomiting.  Genitourinary: Positive for enuresis and frequency.  Musculoskeletal: Negative.   Skin: Negative.   Psychiatric/Behavioral: Positive for hallucinations.    Objective:  Physical Exam Constitutional:      Appearance: He is well-developed.  HENT:     Head: Normocephalic and atraumatic.  Neck:     Musculoskeletal: Normal range of motion.  Cardiovascular:     Rate and Rhythm: Normal rate and regular rhythm.  Pulmonary:     Effort: Pulmonary effort is normal. No respiratory distress.     Breath sounds: Normal breath sounds. No wheezing or rales.  Abdominal:     General: Bowel sounds are normal. There is no distension.     Palpations: Abdomen is soft.     Tenderness: There is no abdominal tenderness. There is no rebound.  Skin:    General: Skin is warm and dry.  Neurological:     Mental Status: He is alert and oriented to person, place, and time.   Coordination: Coordination abnormal.     Vitals:   09/20/18 0835  BP: 120/80  Pulse: (!) 58  Temp: 98 F (36.7 C)  TempSrc: Oral  SpO2: 99%  Weight: 190 lb (86.2 kg)  Height: 6\' 1"  (1.854 m)    Assessment & Plan:

## 2018-09-20 NOTE — Patient Instructions (Addendum)
We have sent in the new medicine for the prostate to try called flomax that is 1 pill at bedtime. Try this for 2-3 weeks and then let us know how it is doing.

## 2018-09-20 NOTE — Assessment & Plan Note (Signed)
Will stop myrbetriq due to cost. He will try flomax to see if this can help some with symptoms. Would not recommend anticholinergic for his urinary symptoms at this time. Can use urinal and keep one with him so that if he is unstable he does not have to get up. Will fill out [facility] form as they state he has medicaid to see if he can get home aide.
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
## 2018-09-28 ENCOUNTER — Ambulatory Visit: Payer: Medicare Other | Admitting: Neurology

## 2018-09-28 ENCOUNTER — Encounter: Payer: Self-pay | Admitting: Neurology

## 2018-09-28 VITALS — BP 120/80 | HR 55 | Ht 73.0 in | Wt 193.4 lb

## 2018-09-28 DIAGNOSIS — G20 Parkinson's disease: Secondary | ICD-10-CM

## 2018-09-28 DIAGNOSIS — F0280 Dementia in other diseases classified elsewhere without behavioral disturbance: Secondary | ICD-10-CM

## 2018-09-28 DIAGNOSIS — R441 Visual hallucinations: Secondary | ICD-10-CM

## 2018-09-28 DIAGNOSIS — F330 Major depressive disorder, recurrent, mild: Secondary | ICD-10-CM | POA: Diagnosis not present

## 2018-09-28 MED ORDER — CARBIDOPA-LEVODOPA ER 25-100 MG PO TBCR: 2.0000 | EXTENDED_RELEASE_TABLET | Freq: Four times a day (QID) | ORAL | 1 refills | Status: DC

## 2018-09-28 NOTE — Patient Instructions (Addendum)
1. Stop Carbidopa Levodopa IR. Change to Carbidopa Levodopa 25/100 CR. Continue 2 tablets four times daily. Continue Carbidopa Levodopa 50/200 CR 1 tablet 30 minutes prior to bedtime.

## 2018-10-06 ENCOUNTER — Ambulatory Visit (HOSPITAL_COMMUNITY)
Admission: RE | Admit: 2018-10-06 | Discharge: 2018-10-06 | Disposition: A | Discharge status: Home / Self Care | Payer: Medicare Other | Source: Ambulatory Visit | Attending: Cardiovascular Disease | Admitting: Cardiovascular Disease

## 2018-10-06 DIAGNOSIS — I714 Abdominal aortic aneurysm, without rupture, unspecified: Secondary | ICD-10-CM

## 2018-10-08 ENCOUNTER — Ambulatory Visit: Payer: Self-pay | Admitting: *Deleted

## 2018-10-08 NOTE — Telephone Encounter (Signed)
Can move flomax (tamsulosin) to morning to see if this is causing the problem.

## 2018-10-08 NOTE — Telephone Encounter (Signed)
LVM to call back for MD results
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
## 2018-10-08 NOTE — Telephone Encounter (Signed)
Patient called, advised him and wife of the note below from Dr. Sharlet Salina to take Flomax in the morning, they both verbalized understanding. Advised to call back if that is not fixing the problem.

## 2018-10-10 ENCOUNTER — Other Ambulatory Visit: Payer: Self-pay | Admitting: Neurology

## 2018-10-11 ENCOUNTER — Telehealth: Payer: Self-pay | Admitting: Neurology

## 2018-10-11 DIAGNOSIS — R441 Visual hallucinations: Secondary | ICD-10-CM

## 2018-10-11 DIAGNOSIS — Z5181 Encounter for therapeutic drug level monitoring: Secondary | ICD-10-CM

## 2018-10-11 NOTE — Telephone Encounter (Signed)
Patient's wife called and wanted to let Dr. Carles Collet know that Derek Harrell didn't have a good [place] today. He has been taking Carbidopa Levodopa mediation 25-100 MG. She said he was started on a new medication and she didn't know if maybe the other had not had time to get out of his system? Please Call. Thanks

## 2018-10-11 NOTE — Telephone Encounter (Signed)
Derek Harrell called to check on this.  States that [facility] told her that the doctor would have to complete a form and she wants to know if this has been done.

## 2018-10-11 NOTE — Telephone Encounter (Signed)
Do you know what type of form we would have to fill out for visiting angels?

## 2018-10-12 NOTE — Telephone Encounter (Signed)
Left message on machine for patient's wife to call back.   

## 2018-10-12 NOTE — Telephone Encounter (Signed)
Spoke with patient's wife.  He has been on CR version of Carbidopa Levodopa for 10-12 days. Yesterday was a rough [place]. He was moving very slow. Needed a lot of help with transitions (moving through doorways, sitting, etc). He seems to be better with it today. He is still having hallucinations. Worse at night. She is okay waiting longer to see if it will help, but wanted to give [place] an update.  Dr. Carles Collet - please advise.

## 2018-10-12 NOTE — Telephone Encounter (Signed)
Find out times of [place] he is taking the carbidopa/levodopa 25/100 CR, 2 po qid.

## 2018-10-13 NOTE — Telephone Encounter (Signed)
Called again and spoke with wife. He is taking Levodopa CR - 2 tablets at 7am, 10am, 1pm, and 4pm.

## 2018-10-14 NOTE — Telephone Encounter (Signed)
Start nuplazid.

## 2018-10-14 NOTE — Telephone Encounter (Signed)
He is currently on Nuplazid.

## 2018-10-14 NOTE — Telephone Encounter (Signed)
Sorry!  I wrote that I may have to add low dose seroquel to that.  Get EKG first and then will consider doing that.

## 2018-10-14 NOTE — Addendum Note (Signed)
Addended byAnnamaria Helling on: 10/14/2018 01:30 PM   Modules accepted: Orders

## 2018-10-14 NOTE — Telephone Encounter (Signed)
Spoke with patient's wife. She is agreeable to EKG. Order entered and faxed to [facility] at [phone] with confirmation received. They will call them directly to schedule.

## 2018-10-15 ENCOUNTER — Other Ambulatory Visit: Payer: Self-pay

## 2018-10-15 DIAGNOSIS — I714 Abdominal aortic aneurysm, without rupture, unspecified: Secondary | ICD-10-CM

## 2018-10-18 ENCOUNTER — Other Ambulatory Visit: Payer: Self-pay

## 2018-10-18 ENCOUNTER — Ambulatory Visit (INDEPENDENT_AMBULATORY_CARE_PROVIDER_SITE_OTHER): Payer: Medicare Other | Admitting: Cardiology

## 2018-10-18 ENCOUNTER — Telehealth: Payer: Self-pay

## 2018-10-18 DIAGNOSIS — I714 Abdominal aortic aneurysm, without rupture, unspecified: Secondary | ICD-10-CM

## 2018-10-18 DIAGNOSIS — Z0000 Encounter for general adult medical examination without abnormal findings: Secondary | ICD-10-CM | POA: Diagnosis not present

## 2018-10-18 DIAGNOSIS — R9431 Abnormal electrocardiogram [ECG] [EKG]: Secondary | ICD-10-CM

## 2018-10-20 NOTE — Telephone Encounter (Signed)
Derek Harrell, let pt know that EKG had normal QT but demonstrated intermittent complete heart block.  Refer to [facility] (Dr. Einar Gip or Dr. Virgina Jock) for consult.

## 2018-10-21 NOTE — Telephone Encounter (Signed)
Patient's wife made aware of results and agreeable to referral. Referral sent.

## 2018-10-21 NOTE — Progress Notes (Signed)
Please review. Happy to see him if needed

## 2018-10-23 ENCOUNTER — Other Ambulatory Visit: Payer: Self-pay | Admitting: Neurology

## 2018-10-28 ENCOUNTER — Telehealth: Payer: Self-pay | Admitting: Neurology

## 2018-10-28 NOTE — Telephone Encounter (Signed)
Patient wife called and needs to ask some questions about the carbidopa levodopa please call

## 2018-10-28 NOTE — Telephone Encounter (Signed)
Spoke with patient's wife.  She states Carbidopa Levodopa CR 25/100 tablet looks different then the last one they received (different color) she wanted to verify it was correct. I looked up pill to verify (One side said Mylan/ one side said 88). She expressed appreciation. Will call if needed.

## 2018-11-15 ENCOUNTER — Telehealth: Payer: Self-pay | Admitting: Occupational Therapy

## 2018-11-15 NOTE — Telephone Encounter (Signed)
Therapist called pt to make him aware of the modification of clinic hours. Therapist spoke with pt's wife [name] and recommended rescheduling pt's PT/OT evals for June. Pt's wife is in agreement and states pt is doing well. Pt/wife to call back closer to June to reschedule PT/OT evals. Theone Murdoch, OTR/L [phone] Phone: [phone] 3:21 PM 11/15/18   [place] [address] [place], [place]  [address] Phone:  [phone] Fax:  [phone]

## 2018-11-17 ENCOUNTER — Telehealth: Payer: Self-pay

## 2018-11-17 NOTE — Telephone Encounter (Signed)
error 

## 2018-12-01 ENCOUNTER — Ambulatory Visit: Payer: Medicare Other | Admitting: Occupational Therapy

## 2018-12-01 ENCOUNTER — Ambulatory Visit: Payer: Medicare Other | Admitting: Physical Therapy

## 2018-12-03 ENCOUNTER — Encounter: Payer: Self-pay | Admitting: *Deleted

## 2018-12-03 ENCOUNTER — Telehealth: Payer: Self-pay | Admitting: Neurology

## 2018-12-03 NOTE — Progress Notes (Signed)
Virtual Visit via Video Note The purpose of this virtual visit is to provide medical care while limiting exposure to the novel coronavirus.    Consent was obtained for video visit:  Yes.   Answered questions that patient had about telehealth interaction:  Yes.   I discussed the limitations, risks, security and privacy concerns of performing an evaluation and management service by telemedicine. I also discussed with the patient that there may be a patient responsible charge related to this service. The patient expressed understanding and agreed to proceed.  Pt location: Home Physician Location: office Name of referring provider:  [name], * I connected with Derek Harrell at patients initiation/request on 12/06/2018 at  1:00 PM EDT by video enabled telemedicine application and verified that I am speaking with the correct person using two identifiers. Pt MRN:  [id number] Pt DOB:  03-Aug-1946 Video Participants:  Derek Harrell;  wife   History of Present Illness:  Patient is seen today in follow-up for his Parkinsonism, accompanied by his wife who supplements the history.  Patients immediate release levodopa was discontinued last visit because of increasing hallucinations and we started him on carbidopa/levodopa 25/100 CR, 2 tablets 4 times per Pauli.  He is already on Nuplazid.  In anticipation of potentially having to add quetiapine to the Nuplazid, I ordered an EKG to look QT interval, and patient had intermittent complete heart block.  I referred him to [facility] where the EKG was done but the referral wasn't placed correctly and was cancelled by [facility](unbeknownst to me or the patient).  Pt states that he isn't having hallucinations.  Wife thinks that is because she is staying at home during the pandemic and he is "more relaxed."    He is walking for exercise in the neighborhood.   Observations/Objective:   Vitals:   12/06/18 1248  BP: 104/60  Weight: 189 lb (85.7 kg)    GEN:  The patient appears stated age and is in NAD.  Neurological examination:  Orientation:  Montreal Cognitive Assessment  09/28/2018  Visuospatial/ Executive (0/5) 1  Naming (0/3) 3  Attention: Read list of digits (0/2) 2  Attention: Read list of letters (0/1) 1  Attention: Serial 7 subtraction starting at 100 (0/3) 0  Language: Repeat phrase (0/2) 2  Language : Fluency (0/1) 0  Abstraction (0/2) 1  Delayed Recall (0/5) 1  Orientation (0/6) 6  Total 17  Adjusted Score (based on education) 17    Cranial nerves: There is good facial symmetry.  There is bilateral ptosis.  There is facial hypomimia.  The speech is fluent and clear. Soft palate rises symmetrically and there is no tongue deviation. Hearing is intact to conversational tone. Motor: Strength is at least antigravity x 4.   Shoulder shrug is equal and symmetric.  There is no pronator drift.  Movement examination: Tone: unable Abnormal movements: None Coordination:  There is  decremation with RAM's, with hand opening and closing and finger taps bilaterally, right more than left Gait and Station: The patient pushes off of the chair to arise. The patient is walking in a small area, so it is difficult to comment on stride length.  He does have significant difficulty in the turns    Assessment and Plan:   1.  Parkinsonism             -I had a long discussion with the patient and family.  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  I talked to the patient about the difference between idiopathic Parkinson's disease and vascular parkinsonism.  In addition, the sx's apparently started after a stroke which would suggest vascular etiology.  I told the patient that carbidopa/levodopa only works about 30% of the time in patients with vascular parkinsonism.  However, once taken off of levodopa for the levodopa challenge, the patient looked and felt significantly worse and  realized that the medicine must have been helping.               -Levodopa challenge done on 11/05/2016 demonstrated marked improvement in walking on med compared to off of med.               -Continue carbidopa/levodopa 25/100 CR, 2 tablets 4 times per [place].  Hallucinations do seem somewhat better, but it is unclear if it was because of the switch to the CR, or because the wife is now at home more             -will continue the carbidopa/levodopa 50/200 q hs.  Risks, benefits, side effects and alternative therapies were discussed.  The opportunity to ask questions was given and they were answered to the best of my ability.  The patient expressed understanding and willingness to follow the outlined treatment protocols.             -reiterated to patient that he is not to be driving.  He asked me many times about this today and discussed exactly the reason why I do not want him driving, both a combination of physical aspects (slow reflexes) as well as memory.  -referral to [facility] for home PT.  He is homebound given the pandemic.  He is having significant difficulty with the turns.  -Discussed exercise classes that he could do at home                2.  Myasthenia gravis             -EMG testing done in February, 2018 is consistent with the diagnosis of myasthenia gravis.              -mestinon has helped.    3.  abnormal EKG  -EKG with evidence of complete heart block.  Sent a referral to cardiology on 3/19 but looks like [facility] cancelled the referral.  Will have the office call today to [facility] for an appointment.  Wife understands to call me should they not hear from the office in the next 2 days.  They will go to the emergency room with any symptoms.  4.  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  Was worried about process of pt assist and walked him through that.     Nuplazid seems to be helping more than it was, although wife is home now with the pandemic.  This seems to support the idea that the patient is going to need 24 hour/[place] care.             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    Wife asked me about the Moca and it score again today, even that was done last visit.  Discussed this with her.  5.  Depression             -Wife thinks that moodiness and depression  has been an issue, but I really do not want to add more medication right now, especially given cardiovascular issues, as well as the fact that hallucinations have just resolved.  They were both agreeable to that.  Follow Up Instructions: Follow-up 4-5 months, sooner should new neurologic issues arise.   -I discussed the assessment and treatment plan with the patient. The patient was provided an opportunity to ask questions and all were answered. The patient agreed with the plan and demonstrated an understanding of the instructions.   The patient was advised to call back or seek an in-person evaluation if the symptoms worsen or if the condition fails to improve as anticipated.    Total Time spent in visit with the patient was:  25 min, of which more than 50% of the time was spent in counseling and/or coordinating care on safety.   Pt understands and agrees with the plan of care outlined.     Alonza Bogus, DO

## 2018-12-03 NOTE — Telephone Encounter (Signed)
Spoke with patient/wife re: EKG and fact that hasn't gotten to cardiology yet.  They feel he is doing well and if any s/s (palpitations, lethargy, CP/SOB, etc), will go to ER.  Understand seriousness.  Will try and get into cardiology next week

## 2018-12-06 ENCOUNTER — Other Ambulatory Visit: Payer: Self-pay

## 2018-12-06 ENCOUNTER — Ambulatory Visit (INDEPENDENT_AMBULATORY_CARE_PROVIDER_SITE_OTHER): Payer: Medicare Other | Admitting: Internal Medicine

## 2018-12-06 ENCOUNTER — Encounter: Payer: Self-pay | Admitting: Neurology

## 2018-12-06 ENCOUNTER — Encounter: Payer: Self-pay | Admitting: Internal Medicine

## 2018-12-06 ENCOUNTER — Telehealth (INDEPENDENT_AMBULATORY_CARE_PROVIDER_SITE_OTHER): Payer: Medicare Other | Admitting: Neurology

## 2018-12-06 DIAGNOSIS — J3089 Other allergic rhinitis: Secondary | ICD-10-CM | POA: Diagnosis not present

## 2018-12-06 DIAGNOSIS — I442 Atrioventricular block, complete: Secondary | ICD-10-CM | POA: Diagnosis not present

## 2018-12-06 DIAGNOSIS — R441 Visual hallucinations: Secondary | ICD-10-CM

## 2018-12-06 DIAGNOSIS — G20 Parkinson's disease: Secondary | ICD-10-CM | POA: Diagnosis not present

## 2018-12-06 DIAGNOSIS — Z7189 Other specified counseling: Secondary | ICD-10-CM | POA: Diagnosis not present

## 2018-12-06 DIAGNOSIS — F0280 Dementia in other diseases classified elsewhere without behavioral disturbance: Secondary | ICD-10-CM | POA: Diagnosis not present

## 2018-12-06 NOTE — Progress Notes (Signed)
Virtual Visit via Video Note  I connected with Derek Harrell on 12/06/18 at 12:20 PM EDT by a video enabled telemedicine application and verified that I am speaking with the correct person using two identifiers.  The patient and the provider were at separate locations throughout the entire encounter.   I discussed the limitations of evaluation and management by telemedicine and the availability of in person appointments. The patient expressed understanding and agreed to proceed.  History of Present Illness: The patient is a 73 y.o. man with visit for running nose. Started about 2-3 days ago. Is outdoors but no more than usual. Does have allergies and this seems similar. He is high risk for coronavirus and they wanted to check in about this. Denies fevers or chills. Denies new cough or SOB. Denies GI symptoms new. They do go out some but he usually stays in the car so he is practicing social distancing. Denies worsening symptoms. Overall it is stable since onset. Has tried nothing for symptoms  Observations/Objective: Appearance: normal, breathing appears normal, casual grooming, abdomen dose not appear distended, throat normal, mental status is A and O times 2 104/60 BP this morning  Assessment and Plan: See problem oriented charting  Follow Up Instructions: Advised to try zyrtec over the counter for symptoms and monitor for fevers and chills and SOB, educated about covid-19  I discussed the assessment and treatment plan with the patient. The patient was provided an opportunity to ask questions and all were answered. The patient agreed with the plan and demonstrated an understanding of the instructions.   The patient was advised to call back or seek an in-person evaluation if the symptoms worsen or if the condition fails to improve as anticipated.  [name], MD

## 2018-12-06 NOTE — Patient Instructions (Signed)
You may try the [facility] classes on [address] [facility] TV) and the [employer] Moves videos on You Tube (each position is a separate video done by Gray Bernhardt, in the teal blue outfit) as good first options for exercise if you cannot get out of the house to exercise.  In addition, [email or web address] has online classes that are being streamed for Parkinsons.   You should also try [facility] website where you  can learn about programs available virtually Monday-Friday from the [place] that focus on everything from mindfulness to fitness to education and more.

## 2018-12-06 NOTE — Assessment & Plan Note (Signed)
Emphasized the importance of social distancing as he is high risk for complication of [id number]. Talked about symptoms and monitoring.

## 2018-12-06 NOTE — Assessment & Plan Note (Signed)
Advised to start otc zyrtec. Do not feel symptoms are related to covid-19.

## 2018-12-07 ENCOUNTER — Other Ambulatory Visit: Payer: Self-pay | Admitting: Neurology

## 2018-12-07 ENCOUNTER — Telehealth: Payer: Self-pay | Admitting: Neurology

## 2018-12-07 DIAGNOSIS — G20 Parkinson's disease: Secondary | ICD-10-CM

## 2018-12-07 NOTE — Telephone Encounter (Signed)
-----   Message from [place], DO sent at 12/06/2018  1:20 PM EDT ----- Please call [facility] (this is the patient we were working on Friday) and get an appt with Dr. Virgina Jock or Dr. Einar Gip this week for dx of complete heart block.  They did EKG at their office.  Also, please refer to [facility] for PT.  Dx:  PD.

## 2018-12-07 NOTE — Telephone Encounter (Signed)
Pt have an appt to [facility] 12/10/18 @ 3:00pm and ordered PT--[facility] to [facility]

## 2018-12-10 ENCOUNTER — Other Ambulatory Visit: Payer: Self-pay

## 2018-12-10 ENCOUNTER — Ambulatory Visit: Payer: Medicare Other | Admitting: Cardiology

## 2018-12-10 ENCOUNTER — Encounter: Payer: Self-pay | Admitting: Cardiology

## 2018-12-10 VITALS — BP 131/86 | HR 59 | Ht 73.0 in | Wt 184.0 lb

## 2018-12-10 DIAGNOSIS — I712 Thoracic aortic aneurysm, without rupture, unspecified: Secondary | ICD-10-CM

## 2018-12-10 DIAGNOSIS — I714 Abdominal aortic aneurysm, without rupture, unspecified: Secondary | ICD-10-CM

## 2018-12-10 DIAGNOSIS — I10 Essential (primary) hypertension: Secondary | ICD-10-CM

## 2018-12-10 DIAGNOSIS — R9431 Abnormal electrocardiogram [ECG] [EKG]: Secondary | ICD-10-CM
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
## 2018-12-11 ENCOUNTER — Encounter: Payer: Self-pay | Admitting: Cardiology

## 2018-12-17 ENCOUNTER — Ambulatory Visit: Payer: Medicare Other | Admitting: Neurology

## 2019-01-04 ENCOUNTER — Telehealth: Payer: Self-pay

## 2019-01-04 NOTE — Telephone Encounter (Signed)
   Called from [place] at [facility] for verbal for telephone visit for PT once a week x 3 weeks  Audio and video call visit  Given verbal please be aware

## 2019-01-19 ENCOUNTER — Telehealth: Payer: Self-pay | Admitting: Neurology

## 2019-01-19 NOTE — Telephone Encounter (Signed)
Ok for verbal 

## 2019-01-19 NOTE — Telephone Encounter (Signed)
Liji at [facility] physical therapy called requesting VERBAL ORDERS for missed visit 01/13/19 to be added to the visits next week 6/22-6/26/20. Liji ph [phone].

## 2019-01-20 NOTE — Telephone Encounter (Signed)
Called Liji she was informed of verbal approval

## 2019-01-20 NOTE — Telephone Encounter (Signed)
Pt will be D/C in 2 weeks.

## 2019-01-21 ENCOUNTER — Other Ambulatory Visit: Payer: Self-pay | Admitting: Internal Medicine

## 2019-02-02 ENCOUNTER — Telehealth: Payer: Self-pay

## 2019-02-02 NOTE — Telephone Encounter (Signed)
Lorretta Harp, MD  Patwardhan, Reynold Bowen, MD; Annita Brod, RN        Thx Manish. Chi, can you arrange a virtual OP f/u with me please?   JJB   Previous Messages  ----- Message -----  From: Nigel Mormon, MD  Sent: 12/11/2018  3:46 PM EDT  To: Lorretta Harp, MD   Patient was referred to me for abnormal EKG. It looks like he is established with you in the past. Although you haven't seen him in a while, he has been getting serial monitoring of his AAA through your office. I asked him to follow up with you re: his AAA, as well as TAA.   Thanks  [employer] pt to set up appt. Left detailed message per DPR and advised to call back

## 2019-02-15 ENCOUNTER — Other Ambulatory Visit: Payer: Self-pay

## 2019-02-15 ENCOUNTER — Emergency Department (HOSPITAL_COMMUNITY)
Admission: EM | Admit: 2019-02-15 | Discharge: 2019-02-16 | Disposition: A | Discharge status: Home / Self Care | Payer: Medicare Other | Attending: Emergency Medicine | Admitting: Emergency Medicine

## 2019-02-15 ENCOUNTER — Emergency Department (HOSPITAL_COMMUNITY): Payer: Medicare Other

## 2019-02-15 ENCOUNTER — Encounter (HOSPITAL_COMMUNITY): Payer: Self-pay | Admitting: Emergency Medicine

## 2019-02-15 DIAGNOSIS — Z87891 Personal history of nicotine dependence: Secondary | ICD-10-CM | POA: Insufficient documentation

## 2019-02-15 DIAGNOSIS — W19XXXA Unspecified fall, initial encounter: Secondary | ICD-10-CM | POA: Diagnosis not present

## 2019-02-15 DIAGNOSIS — I10 Essential (primary) hypertension: Secondary | ICD-10-CM | POA: Diagnosis not present

## 2019-02-15 DIAGNOSIS — R531 Weakness: Secondary | ICD-10-CM | POA: Diagnosis present

## 2019-02-15 DIAGNOSIS — Z8673 Personal history of transient ischemic attack (TIA), and cerebral infarction without residual deficits: Secondary | ICD-10-CM | POA: Insufficient documentation

## 2019-02-15 DIAGNOSIS — R55 Syncope and collapse: Secondary | ICD-10-CM | POA: Diagnosis not present

## 2019-02-15 DIAGNOSIS — Y929 Unspecified place or not applicable: Secondary | ICD-10-CM | POA: Diagnosis not present

## 2019-02-15 DIAGNOSIS — Z79899 Other long term (current) drug therapy: Secondary | ICD-10-CM | POA: Diagnosis not present

## 2019-02-15 DIAGNOSIS — Y939 Activity, unspecified: Secondary | ICD-10-CM | POA: Insufficient documentation

## 2019-02-15 DIAGNOSIS — Y998 Other external cause status: Secondary | ICD-10-CM | POA: Diagnosis not present

## 2019-02-15 LAB — BASIC METABOLIC PANEL
Anion gap: 10 (ref 5–15)
BUN: 13 mg/dL (ref 8–23)
CO2: 23 mmol/L (ref 22–32)
Calcium: 8.9 mg/dL (ref 8.9–10.3)
Chloride: 106 mmol/L (ref 98–111)
Creatinine, Ser: 0.91 mg/dL (ref 0.61–1.24)
GFR calc Af Amer: >60 mL/min (ref >60)
GFR calc non Af Amer: >60 mL/min (ref >60)
Glucose, Bld: 96 mg/dL (ref 70–99)
Potassium: 3.9 mmol/L (ref 3.5–5.1)
Sodium: 139 mmol/L (ref 135–145)

## 2019-02-15 LAB — URINALYSIS, ROUTINE W REFLEX MICROSCOPIC
Bilirubin Urine: NEGATIVE
Glucose, UA: NEGATIVE mg/dL
Hgb urine dipstick: NEGATIVE
Ketones, ur: 5 mg/dL — AB
Leukocytes,Ua: NEGATIVE
Nitrite: NEGATIVE
Protein, ur: NEGATIVE mg/dL
Specific Gravity, Urine: 1.012 (ref 1.005–1.030)
pH: 5 (ref 5.0–8.0)

## 2019-02-15 LAB — CBG MONITORING, ED: Glucose-Capillary: 94 mg/dL (ref 70–99)

## 2019-02-15 LAB — CBC
HCT: 42.4 % (ref 39.0–52.0)
Hemoglobin: 13.8 g/dL (ref 13.0–17.0)
MCH: 27 pg (ref 26.0–34.0)
MCHC: 32.5 g/dL (ref 30.0–36.0)
MCV: 83 fL (ref 80.0–100.0)
Platelets: 133 10*3/uL — ABNORMAL LOW (ref 150–400)
RBC: 5.11 MIL/uL (ref 4.22–5.81)
RDW: 13.8 % (ref 11.5–15.5)
WBC: 6.3 10*3/uL (ref 4.0–10.5)
nRBC: 0 % (ref 0.0–0.2)

## 2019-02-15 LAB — TROPONIN I (HIGH SENSITIVITY): Troponin I (High Sensitivity): 6 ng/L (ref <18)

## 2019-02-15 MED ORDER — SODIUM CHLORIDE 0.9% FLUSH: 3.0000 mL | Freq: Once | INTRAVENOUS | Status: DC

## 2019-02-15 MED ORDER — SODIUM CHLORIDE 0.9 % IV BOLUS
500.0000 mL | Freq: Once | INTRAVENOUS | Status: AC
  Administered 2019-02-15: 500 mL via INTRAVENOUS

## 2019-02-15 MED ORDER — HYDRALAZINE HCL 20 MG/ML IJ SOLN
5.0000 mg | INTRAMUSCULAR | Status: AC
  Administered 2019-02-15: 5 mg via INTRAVENOUS
  Filled 2019-02-15: qty 1

## 2019-02-15 NOTE — ED Notes (Signed)
Spoke with patent's wife and provided update.
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
## 2019-02-15 NOTE — ED Triage Notes (Signed)
Pt states he was standing in a house when he turned around and "felt him self fall" pt thinks he passed out and hit his head on the floor. Pt has small lac on right side of head. Pt states when he stood up he noticed central CP. Denies any change in vision, dizziness or headache.

## 2019-02-16 MED ORDER — AMOXICILLIN-POT CLAVULANATE 875-125 MG PO TABS
1.0000 | ORAL_TABLET | Freq: Once | ORAL | Status: DC
  Filled 2019-02-16: qty 1

## 2019-02-16 MED ORDER — AMOXICILLIN-POT CLAVULANATE 875-125 MG PO TABS: 1.0000 | ORAL_TABLET | Freq: Two times a day (BID) | ORAL | 0 refills | Status: DC

## 2019-02-16 NOTE — ED Notes (Signed)
Pt discharged with all belongings. Discharge instructions reviewed with pt, and pt verbalized understanding. Opportunity for questions provided.  

## 2019-02-16 NOTE — ED Notes (Signed)
Pt unable to sign discharge instructions.

## 2019-02-21 ENCOUNTER — Encounter: Payer: Self-pay | Admitting: Internal Medicine

## 2019-02-21 ENCOUNTER — Ambulatory Visit (INDEPENDENT_AMBULATORY_CARE_PROVIDER_SITE_OTHER): Payer: Medicare Other | Admitting: Internal Medicine

## 2019-02-21 DIAGNOSIS — G20 Parkinson's disease: Secondary | ICD-10-CM | POA: Diagnosis not present

## 2019-02-21 DIAGNOSIS — R351 Nocturia: Secondary | ICD-10-CM | POA: Diagnosis not present

## 2019-02-21 DIAGNOSIS — I10 Essential (primary) hypertension: Secondary | ICD-10-CM

## 2019-02-21 NOTE — Assessment & Plan Note (Signed)
Has been elevated recently but then improved at home, to continue current med tx BP Readings from Last 3 Encounters:  02/16/19 (!) 178/104  12/10/18 131/86  12/06/18 104/60

## 2019-02-21 NOTE — Assessment & Plan Note (Signed)
Stable, to cont f/u neurology
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
## 2019-02-21 NOTE — Assessment & Plan Note (Signed)
With urinary freq likely BPH vs OAB, tolerating the flomax, has not tolerated anticholinergics, doubt finasteride or myrbetriq would be tolerated; recent UA neg so low suspicion for infection; so simply reinforced the idea for keeping the urinal at bedside to ease the difficulty with getting up to BR

## 2019-02-21 NOTE — Patient Instructions (Signed)
Please keep the urinal by the bedside  Please continue all other medications as before, and refills have been done if requested.  Please have the pharmacy call with any other refills you may need.  Please keep your appointments with your specialists as you may have planned

## 2019-02-22 ENCOUNTER — Ambulatory Visit: Payer: Self-pay | Admitting: *Deleted

## 2019-02-22 NOTE — Telephone Encounter (Signed)
We discussed this at his visit.  I could find no documentation as to why the antibiotic was needed, and was prescribed by a ED physician who apparently took over after the previous ED physician shift ended; I believe this was a mistake  No need for antibiotic at this time

## 2019-02-22 NOTE — Telephone Encounter (Signed)
Pt's wife [name] has been informed and expressed understanding.

## 2019-02-22 NOTE — Telephone Encounter (Signed)
The pt and his wife, Derek Harrell, called with questions about an antibiotic prescribed in the ED; she is not sure what the antibiotic is supposed to treat if he should take it; pt prescribed amoxicillin -Pot Clavulanate 875-125 mg 1 tablet bid x 7 days on 02/15/2019 per ED MD; his wife said that the pt has not started taking the medication, and, she is not sure that he needs it; the pt was seen by Dr Derek Reichmann, Ky Harrell on 02/21/2019, and she forgot to discuss this with him; will route to office for final disposition; the pt and his wife can be contacted at [phone]   Reason for Disposition . [1] Caller has URGENT medication question about med that PCP or specialist prescribed AND [2] triager unable to answer question  Answer Assessment - Initial Assessment Questions 1.   NAME of MEDICATION: "What medicine are you calling about?"     Amoxicillin-Clavulanate 875-125 mg 2.   QUESTION: "What is your question?"     Should he take this 3.   PRESCRIBING HCP: "Who prescribed it?" Reason: if prescribed by specialist, call should be referred to that group.   ED Physician 4. SYMPTOMS: "Do you have any symptoms?"     N/a 5. SEVERITY: If symptoms are present, ask "Are they mild, moderate or severe?"   n/a 6.  PREGNANCY:  "Is there any chance that you are pregnant?" "When was your last menstrual period?"    n/a  Protocols used: MEDICATION QUESTION CALL-A-AH

## 2019-02-23 ENCOUNTER — Other Ambulatory Visit: Payer: Self-pay

## 2019-02-23 ENCOUNTER — Other Ambulatory Visit: Payer: Self-pay | Admitting: Neurology

## 2019-02-23 ENCOUNTER — Other Ambulatory Visit: Payer: Self-pay | Admitting: Internal Medicine

## 2019-02-23 MED ORDER — CARBIDOPA-LEVODOPA ER 50-200 MG PO TBCR: 1.0000 | EXTENDED_RELEASE_TABLET | Freq: Every day | ORAL | 1 refills | Status: DC

## 2019-02-23 NOTE — Telephone Encounter (Signed)
Requested Prescriptions   Pending Prescriptions Disp Refills  . carbidopa-levodopa (SINEMET CR) 50-200 MG tablet 90 tablet 1    Sig: Take 1 tablet by mouth at bedtime.   Rx last filled:08/09/18 #90 1 refills  Pt last seen:12/06/18 Follow up appt scheduled:03/23/19

## 2019-03-09 ENCOUNTER — Telehealth: Payer: Self-pay | Admitting: Internal Medicine

## 2019-03-09 NOTE — Telephone Encounter (Signed)
[name], patient's wife, calling to inquire if patient can switch back to Myrbetriq (mirabegron) from Tamsulosin.

## 2019-03-10 MED ORDER — MIRABEGRON ER 50 MG PO TB24: 50.0000 mg | ORAL_TABLET | Freq: Every day | ORAL | 3 refills | Status: DC

## 2019-03-10 NOTE — Telephone Encounter (Signed)
LVM with MD response  

## 2019-03-10 NOTE — Telephone Encounter (Signed)
Sent in myrbetriq to switch back to.

## 2019-03-18 ENCOUNTER — Encounter: Payer: Self-pay | Admitting: Neurology

## 2019-03-18 ENCOUNTER — Other Ambulatory Visit: Payer: Self-pay

## 2019-03-18 ENCOUNTER — Telehealth (INDEPENDENT_AMBULATORY_CARE_PROVIDER_SITE_OTHER): Payer: Medicare Other | Admitting: Neurology

## 2019-03-18 VITALS — Ht 73.0 in | Wt 180.0 lb

## 2019-03-18 DIAGNOSIS — G7000 Myasthenia gravis without (acute) exacerbation: Secondary | ICD-10-CM

## 2019-03-18 NOTE — Progress Notes (Signed)
   Virtual Visit via Video Note The purpose of this virtual visit is to provide medical care while limiting exposure to the novel coronavirus.    Consent was obtained for video visit:  Yes.   Answered questions that patient had about telehealth interaction:  Yes.   I discussed the limitations, risks, security and privacy concerns of performing an evaluation and management service by telemedicine. I also discussed with the patient that there may be a patient responsible charge related to this service. The patient expressed understanding and agreed to proceed.  Pt location: Home Physician Location: office Name of referring provider:  [name], * I connected with Derek Harrell at patients initiation/request on 03/18/2019 at  8:10 AM EDT by video enabled telemedicine application and verified that I am speaking with the correct person using two identifiers. Pt MRN:  [id number] Pt DOB:  12/29/1945 Video Participants:  Derek Harrell;  wife   History of Present Illness: This is a 73 y.o. male returning for follow-up of seropositive myasthenia gravis.  His myasthenia is very well controlled on Mestinon 60 mg twice a [place].  He does not have any double vision, jaw fatigue, or limb weakness.  His voice is soft due to Parkinson's disease, there is no dysarthria.  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  His mood has been down because of COVID-19 restrictions limiting his social interaction.   Observations/Objective:   Vitals:   03/18/19 0751  Weight: 180 lb (81.6 kg)  Height: 6\' 1"  (1.854 m)   Patient is awake, alert, and appears comfortable.  Blunted affect, smiles appropriately Extraocular muscles are intact. Moderate bilateral ptosis at rest, no worsening with sustained upgaze.  Face is symmetric.  Speech is hypophonic, not dysarthric. Tongue is midline. Antigravity in all extremities.  No pronator drift. Gait is assisted with a walker and  shows stooped posture with shuffling pattern.   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  Exam remains stable with congenital ptosis, parkinsonian features, there is no gross limb weakness based on the limitations of this virtual visit.  He denies any significant change since his last visit with me. Continue Mestinon 60 mg twice daily at 8 AM and 4 PM.  Okay to take an extra dose as needed.    Follow Up Instructions:   I discussed the assessment and treatment plan with the patient. The patient was provided an opportunity to ask questions and all were answered. The patient agreed with the plan and demonstrated an understanding of the instructions.   The patient was advised to call back or seek an in-person evaluation if the symptoms worsen or if the condition fails to improve as anticipated.  Follow-up in 9 months  Total time spent:  15 minutes     Alda Berthold, DO

## 2019-03-23 ENCOUNTER — Telehealth: Payer: Medicare Other | Admitting: Neurology

## 2019-03-23 ENCOUNTER — Telehealth: Payer: Self-pay

## 2019-03-23 NOTE — Telephone Encounter (Signed)
Left voicemail for patient to schedule follow up visit with Dr.Patel in 9 months.

## 2019-03-24 ENCOUNTER — Ambulatory Visit: Payer: Medicare Other | Admitting: Neurology

## 2019-03-24 NOTE — Progress Notes (Signed)
Virtual Visit via Video Note The purpose of this virtual visit is to provide medical care while limiting exposure to the novel coronavirus.    Consent was obtained for video visit:  Yes.   Answered questions that patient had about telehealth interaction:  Yes.   I discussed the limitations, risks, security and privacy concerns of performing an evaluation and management service by telemedicine. I also discussed with the patient that there may be a patient responsible charge related to this service. The patient expressed understanding and agreed to proceed.  Pt location: Home Physician Location: home Name of referring provider:  [name], * I connected with Deidre Ala Mckim at patients initiation/request on 03/25/2019 at  1:00 PM EDT by video enabled telemedicine application and verified that I am speaking with the correct person using two identifiers. Pt MRN:  [id number] Pt DOB:  [birth year] Video Participants:  Deidre Ala Berdan;  wife   History of Present Illness:  Patient is seen today in follow-up for parkinsonism.  His wife supplements the history.  He is on carbidopa/levodopa 25/100 CR, 2 tablets 4 times per [place] and carbidopa/levodopa 50/200 at bedtime.  He is also on Nuplazid for hallucinations.  Pt denies hallucinations but wife states that he still sees people in the [place].  Pt states that "not like they were."  Pts wife states that since she is working from home he will think that there is "another [name] (her name)."  I asked if pt seems paranoid that there is affair and pt denies and wife thinks that is the case.   Medical records are reviewed since our last visit.  He was in the emergency room in July after a fall in the kitchen.  Emergency room reported that patient denied loss of consciousness, but felt that that was somewhat questionable.  He did hit his head.  CT of the brain was completed and I personally reviewed this.  It was nonacute.  Wife states that she is sure that he was  dehydrated, although BP was quite elevated in ED.  He does not drink much water.  There was a significant amount of small vessel disease.  Wife also states that they made appt today because they saw cardiology, Dr Terrence Dupont a few days ago and pt was near end of dose and wasn't ambulating well.  He was told to follow-up with me.  Patient admitted that he did not take his walker to the appointment.  He was walking with his cane.   Current Outpatient Medications:    AMITIZA 24 MCG capsule, , Disp: , Rfl:    aspirin 81 MG EC tablet, Take 1 tablet (81 mg total) by mouth daily. Swallow whole., Disp: 30 tablet, Rfl: 12   carbidopa-levodopa (SINEMET CR) 50-200 MG tablet, Take 1 tablet by mouth at bedtime., Disp: 90 tablet, Rfl: 1   Carbidopa-Levodopa ER (SINEMET CR) 25-100 MG tablet controlled release, Take 2 tablets by mouth 4 (four) times daily., Disp: 720 tablet, Rfl: 1   clopidogrel (PLAVIX) 75 MG tablet, Take 75 mg by mouth daily., Disp: , Rfl:    ipratropium (ATROVENT) 0.06 % nasal spray, PLACE 2 SPRAYS IN EACH NOSTRIL FOUR TIMES DAILY FOR NASAL CONGESTION, Disp: 15 mL, Rfl: 0   mirabegron ER (MYRBETRIQ) 50 MG TB24 tablet, Take 1 tablet (50 mg total) by mouth daily., Disp: 90 tablet, Rfl: 3   nebivolol (BYSTOLIC) 10 MG tablet, Take 10 mg by mouth daily., Disp: , Rfl:    NUPLAZID 34 MG CAPS,  TAKE 1 CAPSULE BY MOUTH ONCE DAILY, Disp: 90 capsule, Rfl: 1   pyridostigmine (MESTINON) 60 MG tablet, TAKE 1 TABLET BY MOUTH AT 8 AM, 1 TABLET AT 2 PM, AND 1 TABLET AT 8 PM, Disp: 270 tablet, Rfl: 1   rosuvastatin (CRESTOR) 10 MG tablet, TAKE 1 TABLET(10 MG) BY MOUTH DAILY, Disp: 90 tablet, Rfl: 0   vitamin B-12 (CYANOCOBALAMIN) 1000 MCG tablet, Take 1,000 mcg by mouth daily., Disp: , Rfl:     Observations/Objective:   Vitals:   03/25/19 0839  Weight: 184 lb (83.5 kg)  Height: 6\' 1"  (1.854 m)   GEN:  The patient appears stated age and is in NAD.  Neurological examination:  Orientation:    Montreal Cognitive Assessment  09/28/2018  Visuospatial/ Executive (0/5) 1  Naming (0/3) 3  Attention: Read list of digits (0/2) 2  Attention: Read list of letters (0/1) 1  Attention: Serial 7 subtraction starting at 100 (0/3) 0  Language: Repeat phrase (0/2) 2  Language : Fluency (0/1) 0  Abstraction (0/2) 1  Delayed Recall (0/5) 1  Orientation (0/6) 6  Total 17  Adjusted Score (based on education) 17    Cranial nerves: There is good facial symmetry.  There is bilateral ptosis.  There is facial hypomimia.  The speech is fluent and clear. Soft palate rises symmetrically and there is no tongue deviation. Hearing is intact to conversational tone. Motor: Strength is at least antigravity x 4.   Shoulder shrug is equal and symmetric.  There is no pronator drift.  Movement examination: Tone: unable Abnormal movements: None Coordination:  There is good rapid alternating movements in the upper extremities today. Gait and Station: The patient pushes off of the chair to arise. The patient ambulates very well with his walker in his home today.    Assessment and Plan:   1.  Parkinsonism             -I had a long discussion with the patient and family.  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  I talked to the patient about the difference between idiopathic Parkinson's disease and vascular parkinsonism.  In addition, the sx's apparently started after a stroke which would suggest vascular etiology.  I told the patient that carbidopa/levodopa only works about 30% of the time in patients with vascular parkinsonism.  However, once taken off of levodopa for the levodopa challenge, the patient looked and felt significantly worse and realized that the medicine must have been helping.               -Levodopa challenge done on 11/05/2016 demonstrated marked improvement in walking on med compared to off of med.               -Long discussion on  whether or not to continue with the carbidopa/levodopa 25/100 CR or to try and change to Rytary.  Ultimately, we decided to change to Rytary.  He will take Rytary, 245 mg, 1 tablet at 7 AM/10 AM/1 PM/4 PM/7 PM.  -We will discontinue his carbidopa/levodopa 50/200 for now.             -Discussed that he needs to be using his walker at all times.              2.  Myasthenia gravis             -EMG testing done in February, 2018 is consistent with the diagnosis of myasthenia gravis.              -  mestinon has helped.    3.  abnormal EKG  -EKG with evidence of complete heart block.  Saw cardiology and repeat EKG looked okay.  Following with cardiology  4.  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  Was worried about process of pt assist and walked him through that.    Nuplazid seems to be helping, but not enough.  May need to add aricept.  Didn't want to make too many changes today.  Did discuss 24 hour/Folden care and wife agrees.   Follow Up Instructions: 4-5 months, sooner should new issues arise.   -I discussed the assessment and treatment plan with the patient. The patient was provided an opportunity to ask questions and all were answered. The patient agreed with the plan and demonstrated an understanding of the instructions.   The patient was advised to call back or seek an in-person evaluation if the symptoms worsen or if the condition fails to improve as anticipated.    Total Time spent in visit with the patient was:  40 min, of which more than 50% of the time was spent in counseling and/or coordinating care on safety.   Pt understands and agrees with the plan of care outlined.     Derek Bogus, DO

## 2019-03-25 ENCOUNTER — Encounter: Payer: Self-pay | Admitting: Neurology

## 2019-03-25 ENCOUNTER — Telehealth: Payer: Self-pay

## 2019-03-25 ENCOUNTER — Other Ambulatory Visit: Payer: Self-pay

## 2019-03-25 ENCOUNTER — Telehealth (INDEPENDENT_AMBULATORY_CARE_PROVIDER_SITE_OTHER): Payer: Medicare Other | Admitting: Neurology

## 2019-03-25 VITALS — Ht 73.0 in | Wt 184.0 lb

## 2019-03-25 DIAGNOSIS — G20A1 Parkinson's disease without dyskinesia, without mention of fluctuations: Secondary | ICD-10-CM

## 2019-03-25 DIAGNOSIS — G20 Parkinson's disease: Secondary | ICD-10-CM | POA: Diagnosis not present

## 2019-03-25 DIAGNOSIS — F0280 Dementia in other diseases classified elsewhere without behavioral disturbance: Secondary | ICD-10-CM

## 2019-03-25 NOTE — Telephone Encounter (Signed)
-----   Message from [place], DO sent at 03/25/2019  1:30 PM EDT ----- Call wife and give samples of rytary 245 mg.  Give her following instructions: Stop carbidopa/levodopa 25/100 CR Stop carbidopa/levodopa 50/200 CR He will take Rytary, 245 mg, 1 tablet at 7 AM/10 AM/1 PM/4 PM/7 PM.  IF he does well, have her call you so you can send you in RX for this dosage in 3 weeks.  Also, call rep for samples if we are in need.  I know we need the 145 mg

## 2019-03-25 NOTE — Telephone Encounter (Signed)
Called patient/ spouse no answer left message to call office back to discussion the following.

## 2019-03-30 ENCOUNTER — Telehealth: Payer: Self-pay | Admitting: Neurology

## 2019-03-30 MED ORDER — RYTARY 61.25-245 MG PO CPCR: 1.0000 | ORAL_CAPSULE | Freq: Four times a day (QID) | ORAL | 0 refills | Status: DC

## 2019-03-30 NOTE — Telephone Encounter (Signed)
Message from [place], DO sent at 03/25/2019  1:30 PM EDT ----- Call wife and give samples of rytary 245 mg.  Give her following instructions: Stop carbidopa/levodopa 25/100 CR Stop carbidopa/levodopa 50/200 CR He will take Rytary, 245 mg, 1 tablet at 7 AM/10 AM/1 PM/4 PM/7 PM.  IF he does well, have her call you so you can send you in RX for this dosage in 3 weeks.  Also, call rep for samples if we are in need.  I know we need the 145 mg   Called spouse she will pick up sample tomorrow. [place]  [id number] A EXP:09/20 NDC: [phone]

## 2019-03-30 NOTE — Telephone Encounter (Signed)
Wife is calling in about the carbidopa levodopa medication - she said she was going to call this in or have samples. She hasn't heard anything. Please call her. Pharm on file is correct. Thanks!

## 2019-04-08 ENCOUNTER — Telehealth: Payer: Self-pay | Admitting: Neurology

## 2019-04-08 NOTE — Telephone Encounter (Signed)
I tried to call patient at [phone] no answer, will try again later

## 2019-04-08 NOTE — Telephone Encounter (Signed)
Since it is the weekend, have wife go back to what he was on carbidopa/levodopa 25/100 CR and d/c the rytary.  Then, on Tuesday, she can call the office and we can try the rytary at lower dosage (195 instead of 245) and see if better.  If worse over weekend, may have to go to UC to be checked for UTI, etc.

## 2019-04-08 NOTE — Telephone Encounter (Signed)
Wife notified.
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
## 2019-04-09 ENCOUNTER — Ambulatory Visit (INDEPENDENT_AMBULATORY_CARE_PROVIDER_SITE_OTHER): Payer: Medicare Other

## 2019-04-09 ENCOUNTER — Other Ambulatory Visit: Payer: Self-pay

## 2019-04-09 DIAGNOSIS — Z23 Encounter for immunization: Secondary | ICD-10-CM | POA: Diagnosis not present

## 2019-04-11 ENCOUNTER — Other Ambulatory Visit: Payer: Self-pay | Admitting: Neurology

## 2019-04-12 MED ORDER — RYTARY 48.75-195 MG PO CPCR: 1.0000 | ORAL_CAPSULE | Freq: Every day | ORAL | 0 refills | Status: DC

## 2019-04-12 NOTE — Telephone Encounter (Signed)
Patient's wife, Izora Gala, called requesting to check in with a nurse about how best to proceed with medication changes. She'd like to give an update on the patient since making the changes over the weekend.  [place]

## 2019-04-12 NOTE — Telephone Encounter (Signed)
03/25/19 office note  -Long discussion on whether or not to continue with the carbidopa/levodopa 25/100 CR or to try and change to Rytary.  Ultimately, we decided to change to Rytary.  He will take Rytary, 245 mg, 1 tablet at 7 AM/10 AM/1 PM/4 PM/7 PM.  2 boxes of Rytary 48.75/195MG  place at front desk  [place]: [phone] LOT: [id number] B EXP: 07/2019

## 2019-04-12 NOTE — Telephone Encounter (Signed)
Called patient spouse she was informed of provider response below. She will come by office tomorrow to pick up lower dose Rytary 195 mg  Verbal from Dr. Carles Collet to take same way as he was taken the 245 mg dose

## 2019-04-13 ENCOUNTER — Other Ambulatory Visit: Payer: Self-pay

## 2019-04-13 ENCOUNTER — Telehealth: Payer: Self-pay | Admitting: Emergency Medicine

## 2019-04-13 ENCOUNTER — Ambulatory Visit (INDEPENDENT_AMBULATORY_CARE_PROVIDER_SITE_OTHER): Payer: Medicare Other | Admitting: Family

## 2019-04-13 ENCOUNTER — Other Ambulatory Visit (INDEPENDENT_AMBULATORY_CARE_PROVIDER_SITE_OTHER): Payer: Medicare Other

## 2019-04-13 ENCOUNTER — Encounter: Payer: Self-pay | Admitting: Family

## 2019-04-13 VITALS — BP 138/78 | HR 69 | Temp 98.2°F | Ht 73.0 in | Wt 182.6 lb

## 2019-04-13 DIAGNOSIS — G20 Parkinson's disease: Secondary | ICD-10-CM | POA: Diagnosis not present

## 2019-04-13 DIAGNOSIS — R32 Unspecified urinary incontinence: Secondary | ICD-10-CM

## 2019-04-13 LAB — CBC WITH DIFFERENTIAL/PLATELET
Basophils Absolute: 0.1 10*3/uL (ref 0.0–0.1)
Basophils Relative: 0.9 % (ref 0.0–3.0)
Eosinophils Absolute: 0.3 10*3/uL (ref 0.0–0.7)
Eosinophils Relative: 4 % (ref 0.0–5.0)
HCT: 41.8 % (ref 39.0–52.0)
Hemoglobin: 13.9 g/dL (ref 13.0–17.0)
Lymphocytes Relative: 15.3 % (ref 12.0–46.0)
Lymphs Abs: 1 10*3/uL (ref 0.7–4.0)
MCHC: 33.4 g/dL (ref 30.0–36.0)
MCV: 81.5 fl (ref 78.0–100.0)
Monocytes Absolute: 0.4 10*3/uL (ref 0.1–1.0)
Monocytes Relative: 6.7 % (ref 3.0–12.0)
Neutro Abs: 4.7 10*3/uL (ref 1.4–7.7)
Neutrophils Relative %: 73.1 % (ref 43.0–77.0)
Platelets: 134 10*3/uL — ABNORMAL LOW (ref 150.0–400.0)
RBC: 5.13 Mil/uL (ref 4.22–5.81)
RDW: 14.2 % (ref 11.5–15.5)
WBC: 6.4 10*3/uL (ref 4.0–10.5)

## 2019-04-13 LAB — COMPREHENSIVE METABOLIC PANEL
ALT: 4 U/L (ref 0–53)
AST: 13 U/L (ref 0–37)
Albumin: 3.9 g/dL (ref 3.5–5.2)
Alkaline Phosphatase: 46 U/L (ref 39–117)
BUN: 13 mg/dL (ref 6–23)
CO2: 29 mEq/L (ref 19–32)
Calcium: 9 mg/dL (ref 8.4–10.5)
Chloride: 106 mEq/L (ref 96–112)
Creatinine, Ser: 0.8 mg/dL (ref 0.40–1.50)
GFR: 115.11 mL/min (ref >60.00)
Glucose, Bld: 86 mg/dL (ref 70–99)
Potassium: 4 mEq/L (ref 3.5–5.1)
Sodium: 141 mEq/L (ref 135–145)
Total Bilirubin: 1.3 mg/dL — ABNORMAL HIGH (ref 0.2–1.2)
Total Protein: 6.7 g/dL (ref 6.0–8.3)

## 2019-04-13 LAB — URINALYSIS
Bilirubin Urine: NEGATIVE
Hgb urine dipstick: NEGATIVE
Leukocytes,Ua: NEGATIVE
Nitrite: NEGATIVE
Specific Gravity, Urine: 1.02 (ref 1.000–1.030)
Urine Glucose: NEGATIVE
Urobilinogen, UA: 2 — AB (ref 0.0–1.0)
pH: 6 (ref 5.0–8.0)

## 2019-04-13 LAB — PSA, MEDICARE: PSA: 2.25 ng/ml (ref 0.10–4.00)

## 2019-04-13 NOTE — Telephone Encounter (Signed)
Pt would like a call back. Wants to discuss home healthcare options. Thanks.

## 2019-04-13 NOTE — Addendum Note (Signed)
Addended by: Ranae Plumber on: 04/13/2019 09:24 AM   Modules accepted: Orders

## 2019-04-13 NOTE — Telephone Encounter (Signed)
Requested Prescriptions   Refused Prescriptions Disp Refills  . Carbidopa-Levodopa ER (SINEMET CR) 25-100 MG tablet controlled release 720 tablet 1    Sig: Take 2 tablets by mouth 4 (four) times daily.    Refused By: Ranae Plumber   Assessment and Plan:   1. Parkinsonism -I had a long discussion with the patient and family. 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 I talked to the patient about the difference between idiopathic Parkinson's disease and vascular parkinsonism. In addition, the sx's apparently started after a stroke which would suggest vascular etiology. I told the patient that carbidopa/levodopa only works about 30% of the time in patients with vascular parkinsonism. However, once taken off of levodopa for the levodopa challenge, the patient looked and felt significantly worse and realized that the medicine must have been helping. Levodopa challenge done on 11/05/2016 demonstrated marked improvement in walking on med compared to off of med.  -Long discussion on whether or not to continue with the carbidopa/levodopa 25/100 CR or to try and change to Rytary.  Ultimately, we decided to change to Rytary.  He will take Rytary, 245 mg, 1 tablet at 7 AM/10 AM/1 PM/4 PM/7 PM.             -We will discontinue his carbidopa/levodopa 50/200 for now. -Discussed that he needs to be using his walker at all times.
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
## 2019-04-13 NOTE — Telephone Encounter (Signed)
rx d/c no longer taken Rx change to Rytary 195 mg see last telephone note

## 2019-04-14 ENCOUNTER — Telehealth: Payer: Self-pay

## 2019-04-14 NOTE — Telephone Encounter (Signed)
Patient seen in office and evaluated by Mickel Baas on yesterday.

## 2019-04-15 LAB — URINE CULTURE
MICRO NUMBER:: 861138
Result:: NO GROWTH
SPECIMEN QUALITY:: ADEQUATE

## 2019-04-15 NOTE — Telephone Encounter (Signed)
Contacted pt and spoke to pt and spouse. Spouse is requesting a home health aid. I informed spouse that insurance does not cover the cost home CNA services. Explained the Medicaid is the only ones that would pay for that and that the CNA couse is out of pocket.   Spouse stated understanding.

## 2019-04-21 ENCOUNTER — Telehealth: Payer: Self-pay | Admitting: Neurology

## 2019-04-21 NOTE — Telephone Encounter (Signed)
Wife is calling in about new medication - rytary. She said he is on the last [place] of the samples. Patient is more calm but he is still not moving around a whole lot. She was wanting to know what Dr. Carles Collet wanted to do next since there is not prescription. Thanks!

## 2019-04-21 NOTE — Telephone Encounter (Signed)
Okay for RX of rytary 195, 1 tablet at 7 AM/10 AM/1 PM/4 PM/7 PM.  However, this will take some time for approval via insurance.  Any more samples for him?

## 2019-04-21 NOTE — Telephone Encounter (Signed)
See below

## 2019-04-22 MED ORDER — RYTARY 48.75-195 MG PO CPCR: 1.0000 | ORAL_CAPSULE | Freq: Every day | ORAL | 0 refills | Status: DC

## 2019-04-22 MED ORDER — RYTARY 48.75-195 MG PO CPCR: 1.0000 | ORAL_CAPSULE | Freq: Every day | ORAL | 1 refills | Status: DC

## 2019-04-22 NOTE — Telephone Encounter (Signed)
rytary 195 #100 [id number] a Exp:05/2020 Ndc: [phone]

## 2019-04-22 NOTE — Telephone Encounter (Signed)
Called patient spouse no answer left message to call office back regarding Rytary Will place samples at front desk for him. Will also send Rx to pharmacy for this.

## 2019-04-22 NOTE — Telephone Encounter (Signed)
Spoke with spouse she is aware and will pick up samples today Also aware that rx sent to pharmacy

## 2019-04-30 ENCOUNTER — Emergency Department
Admission: EM | Admit: 2019-04-30 | Discharge: 2019-04-30 | Disposition: A | Discharge status: Home / Self Care | Payer: Medicare Other | Source: Home / Self Care | Attending: Family Medicine | Admitting: Family Medicine

## 2019-04-30 ENCOUNTER — Other Ambulatory Visit: Payer: Self-pay

## 2019-04-30 DIAGNOSIS — R310 Gross hematuria: Secondary | ICD-10-CM

## 2019-04-30 LAB — POCT URINALYSIS DIP (MANUAL ENTRY)
Bilirubin, UA: NEGATIVE
Glucose, UA: NEGATIVE mg/dL
Ketones, POC UA: NEGATIVE mg/dL
Leukocytes, UA: NEGATIVE
Nitrite, UA: NEGATIVE
Protein Ur, POC: 30 mg/dL — AB
Spec Grav, UA: 1.02 (ref 1.010–1.025)
Urobilinogen, UA: 2 E.U./dL — AB
pH, UA: 7 (ref 5.0–8.0)

## 2019-04-30 MED ORDER — SULFAMETHOXAZOLE-TRIMETHOPRIM 800-160 MG PO TABS: 1.0000 | ORAL_TABLET | Freq: Two times a day (BID) | ORAL | 0 refills | Status: DC
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
## 2019-04-30 NOTE — ED Triage Notes (Signed)
Patient here in wheelchair due to Parkinson related instability; wife present and giving history. Patient has been having frequency and night incontinence for weeks; wears Depends HS; today blood was noted on pad and after first urination; denies discomfort. He may have had UTI 2 months ago. They have not travelled past 4 weeks.

## 2019-04-30 NOTE — Discharge Instructions (Addendum)
Increase fluid intake. °If symptoms become significantly worse during the night or over the weekend, proceed to the local emergency room.  °

## 2019-05-01 LAB — URINE CULTURE
MICRO NUMBER:: 927267
Result:: NO GROWTH
SPECIMEN QUALITY:: ADEQUATE

## 2019-05-02 ENCOUNTER — Telehealth: Payer: Self-pay | Admitting: Internal Medicine

## 2019-05-02 NOTE — Telephone Encounter (Signed)
Patient spoke with [facility] on  04/30/19 at 8:19am.  States patient has blood coming from his penis.  States it started that morning.  Patient was advised to see PCP within 24 hours.  Looks like patient may have went to UC.

## 2019-05-02 NOTE — Telephone Encounter (Signed)
Noted  

## 2019-05-08 ENCOUNTER — Emergency Department (HOSPITAL_COMMUNITY)
Admission: EM | Admit: 2019-05-08 | Discharge: 2019-05-08 | Disposition: A | Discharge status: Home / Self Care | Payer: Medicare Other | Attending: Emergency Medicine | Admitting: Emergency Medicine

## 2019-05-08 ENCOUNTER — Emergency Department (HOSPITAL_COMMUNITY): Payer: Medicare Other

## 2019-05-08 ENCOUNTER — Encounter (HOSPITAL_COMMUNITY): Payer: Self-pay | Admitting: Emergency Medicine

## 2019-05-08 DIAGNOSIS — W1830XA Fall on same level, unspecified, initial encounter: Secondary | ICD-10-CM | POA: Diagnosis not present

## 2019-05-08 DIAGNOSIS — R296 Repeated falls: Secondary | ICD-10-CM | POA: Diagnosis not present

## 2019-05-08 DIAGNOSIS — I10 Essential (primary) hypertension: Secondary | ICD-10-CM | POA: Insufficient documentation

## 2019-05-08 DIAGNOSIS — G20 Parkinson's disease: Secondary | ICD-10-CM | POA: Diagnosis not present

## 2019-05-08 DIAGNOSIS — Y92013 Bedroom of single-family (private) house as the place of occurrence of the external cause: Secondary | ICD-10-CM | POA: Insufficient documentation

## 2019-05-08 DIAGNOSIS — Y999 Unspecified external cause status: Secondary | ICD-10-CM | POA: Diagnosis not present

## 2019-05-08 DIAGNOSIS — S4992XA Unspecified injury of left shoulder and upper arm, initial encounter: Secondary | ICD-10-CM | POA: Diagnosis present

## 2019-05-08 DIAGNOSIS — W19XXXA Unspecified fall, initial encounter: Secondary | ICD-10-CM

## 2019-05-08 DIAGNOSIS — Z7901 Long term (current) use of anticoagulants: Secondary | ICD-10-CM | POA: Diagnosis not present

## 2019-05-08 DIAGNOSIS — Z7982 Long term (current) use of aspirin: Secondary | ICD-10-CM | POA: Diagnosis not present

## 2019-05-08 DIAGNOSIS — Z79899 Other long term (current) drug therapy: Secondary | ICD-10-CM | POA: Insufficient documentation

## 2019-05-08 DIAGNOSIS — Z87891 Personal history of nicotine dependence: Secondary | ICD-10-CM | POA: Diagnosis not present

## 2019-05-08 DIAGNOSIS — Y939 Activity, unspecified: Secondary | ICD-10-CM | POA: Diagnosis not present

## 2019-05-08 DIAGNOSIS — S46912A Strain of unspecified muscle, fascia and tendon at shoulder and upper arm level, left arm, initial encounter: Secondary | ICD-10-CM | POA: Diagnosis not present

## 2019-05-08 LAB — BASIC METABOLIC PANEL
Anion gap: 12 (ref 5–15)
BUN: 18 mg/dL (ref 8–23)
CO2: 21 mmol/L — ABNORMAL LOW (ref 22–32)
Calcium: 9.2 mg/dL (ref 8.9–10.3)
Chloride: 107 mmol/L (ref 98–111)
Creatinine, Ser: 1.23 mg/dL (ref 0.61–1.24)
GFR calc Af Amer: >60 mL/min (ref >60)
GFR calc non Af Amer: 59 mL/min — ABNORMAL LOW (ref >60)
Glucose, Bld: 107 mg/dL — ABNORMAL HIGH (ref 70–99)
Potassium: 4.6 mmol/L (ref 3.5–5.1)
Sodium: 140 mmol/L (ref 135–145)

## 2019-05-08 LAB — CBC
HCT: 42 % (ref 39.0–52.0)
Hemoglobin: 14 g/dL (ref 13.0–17.0)
MCH: 27.7 pg (ref 26.0–34.0)
MCHC: 33.3 g/dL (ref 30.0–36.0)
MCV: 83.2 fL (ref 80.0–100.0)
Platelets: 138 10*3/uL — ABNORMAL LOW (ref 150–400)
RBC: 5.05 MIL/uL (ref 4.22–5.81)
RDW: 13.5 % (ref 11.5–15.5)
WBC: 7.7 10*3/uL (ref 4.0–10.5)
nRBC: 0 % (ref 0.0–0.2)
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
## 2019-05-08 NOTE — ED Triage Notes (Addendum)
Pt is wheelchair/walker dependent. Pt has parkinson's and is hard of hearing. Accompanied by wife. Wife states he was treated for hematuria with antibiotics, completed this week. Pt has had increase in falls the past 2 days. Pt wife also reports he has become increasingly difficult to care for over the past few months and he has had increase in hallucinations, speaking to people who are not there. Pt wife states she needs help. Pt wife states the falls are non syncopal falls and are related to transferring/wheelchair. Pt is c.o. left clavicle pain, where wheelchair arm hit him.

## 2019-05-08 NOTE — Discharge Instructions (Signed)
Continue home medications as previously prescribed. We have ordered home health consult for you. Follow-up with your primary care provider. Return to the ED if you start to have worsening symptoms, additional injuries or falls, vomiting, changes to activity or appetite.

## 2019-05-09 ENCOUNTER — Telehealth: Payer: Self-pay | Admitting: Neurology

## 2019-05-09 NOTE — Telephone Encounter (Signed)
Wife is calling in wanting to speak with the nurse about patient having UTI- blood in urine and he is having issues wetting the bed. He has fallen a couple times this weekend. Fall into his wheelchair and it hit his chest so wife took him to the ER yesterday. Took Xrays- no issues. He is starting to have issues saying she is trying to get rid of him which is not true. He is going #2 in his pants also. Social worker said something about OT and PT come into the home. She is wanting to know what Dr. Carles Collet thinks. She is having a hard time handling him. Thanks!

## 2019-05-09 NOTE — Telephone Encounter (Signed)
Behavior is likely due to this UTI but PT/OT is a good idea.  Happy to refer for home health with nursing, PT/OT and social work

## 2019-05-09 NOTE — Telephone Encounter (Signed)
Spoke with spouse she was made aware of provider response  She states that OT/PT/ NURSING/ SOCIAL WORK was order at [facility] Also given information for [facility] for adult daycare

## 2019-05-20 ENCOUNTER — Other Ambulatory Visit: Payer: Self-pay

## 2019-05-20 ENCOUNTER — Telehealth: Payer: Self-pay | Admitting: Neurology

## 2019-05-20 ENCOUNTER — Emergency Department (HOSPITAL_COMMUNITY)
Admission: EM | Admit: 2019-05-20 | Discharge: 2019-05-21 | Disposition: A | Discharge status: Home / Self Care | Payer: Medicare Other | Attending: Emergency Medicine | Admitting: Emergency Medicine

## 2019-05-20 ENCOUNTER — Telehealth: Payer: Self-pay | Admitting: Cardiovascular Disease

## 2019-05-20 DIAGNOSIS — I714 Abdominal aortic aneurysm, without rupture, unspecified: Secondary | ICD-10-CM

## 2019-05-20 DIAGNOSIS — Z87891 Personal history of nicotine dependence: Secondary | ICD-10-CM | POA: Insufficient documentation

## 2019-05-20 DIAGNOSIS — J439 Emphysema, unspecified: Secondary | ICD-10-CM | POA: Diagnosis not present

## 2019-05-20 DIAGNOSIS — Z79899 Other long term (current) drug therapy: Secondary | ICD-10-CM | POA: Insufficient documentation

## 2019-05-20 DIAGNOSIS — G7000 Myasthenia gravis without (acute) exacerbation: Secondary | ICD-10-CM | POA: Insufficient documentation

## 2019-05-20 DIAGNOSIS — Z7982 Long term (current) use of aspirin: Secondary | ICD-10-CM | POA: Diagnosis not present

## 2019-05-20 DIAGNOSIS — G20 Parkinson's disease: Secondary | ICD-10-CM | POA: Insufficient documentation

## 2019-05-20 DIAGNOSIS — I700 Atherosclerosis of aorta: Secondary | ICD-10-CM | POA: Insufficient documentation

## 2019-05-20 DIAGNOSIS — Z7902 Long term (current) use of antithrombotics/antiplatelets: Secondary | ICD-10-CM | POA: Insufficient documentation

## 2019-05-20 DIAGNOSIS — I10 Essential (primary) hypertension: Secondary | ICD-10-CM | POA: Diagnosis not present

## 2019-05-20 LAB — BASIC METABOLIC PANEL
Anion gap: 10 (ref 5–15)
BUN: 14 mg/dL (ref 8–23)
CO2: 25 mmol/L (ref 22–32)
Calcium: 8.8 mg/dL — ABNORMAL LOW (ref 8.9–10.3)
Chloride: 103 mmol/L (ref 98–111)
Creatinine, Ser: 1.08 mg/dL (ref 0.61–1.24)
GFR calc Af Amer: >60 mL/min (ref >60)
GFR calc non Af Amer: >60 mL/min (ref >60)
Glucose, Bld: 142 mg/dL — ABNORMAL HIGH (ref 70–99)
Potassium: 3.6 mmol/L (ref 3.5–5.1)
Sodium: 138 mmol/L (ref 135–145)

## 2019-05-20 LAB — TROPONIN I (HIGH SENSITIVITY)
Troponin I (High Sensitivity): 9 ng/L (ref <18)
Troponin I (High Sensitivity): 6 ng/L (ref <18)

## 2019-05-20 LAB — PROTIME-INR
INR: 1.4 — ABNORMAL HIGH (ref 0.8–1.2)
Prothrombin Time: 16.5 seconds — ABNORMAL HIGH (ref 11.4–15.2)

## 2019-05-20 LAB — CBC
HCT: 42.5 % (ref 39.0–52.0)
Hemoglobin: 14.2 g/dL (ref 13.0–17.0)
MCH: 27.8 pg (ref 26.0–34.0)
MCHC: 33.4 g/dL (ref 30.0–36.0)
MCV: 83.2 fL (ref 80.0–100.0)
Platelets: 158 10*3/uL (ref 150–400)
RBC: 5.11 MIL/uL (ref 4.22–5.81)
RDW: 13.3 % (ref 11.5–15.5)
WBC: 8.3 10*3/uL (ref 4.0–10.5)
nRBC: 0 % (ref 0.0–0.2)

## 2019-05-20 NOTE — Telephone Encounter (Signed)
New message   Patient's wife has questions about an aneurysm in patient's stomach. Please call to discuss.

## 2019-05-20 NOTE — Telephone Encounter (Signed)
Insurance will not pay for this unless they have long term care insurance.  If they can pay out of pocket, Derek Harrell can give them some resources for this

## 2019-05-20 NOTE — Telephone Encounter (Signed)
Trying to get patient a health aide. [facility] insurance will have to call to get process started. She is wanting some mor info on this. Thanks!

## 2019-05-20 NOTE — ED Triage Notes (Signed)
Pt had a abdominal CT this am and was told that he had a 5.6cm aneurysm and that he needed to go to the emergency department. Denies SOB, CP or abdominal pain.

## 2019-05-20 NOTE — Telephone Encounter (Signed)
Spoke with patient spouse she states that she needs a caregiver to help with patient. She states that she does not know how much longer she can do this on her own. She is requesting a [place] to help out with personal hygiene   [facility]

## 2019-05-20 NOTE — Telephone Encounter (Signed)
Called patient spouse no answer left message to call office back  

## 2019-05-20 NOTE — Telephone Encounter (Signed)
Patient wife states that husband went to hospital for blood in his urine- they did a CT today that showed he had kidney stones, but that his AAA had also increased in size- they were concerned of it bursting. I advised that I would route a message to Dr.Berry for his recommendations of what they should do- and look over recent studies..  They stated it was a 5.6 cm from today. Will route to MD.

## 2019-05-21 ENCOUNTER — Emergency Department (HOSPITAL_COMMUNITY): Payer: Medicare Other

## 2019-05-21 DIAGNOSIS — I714 Abdominal aortic aneurysm, without rupture: Secondary | ICD-10-CM

## 2019-05-21 MED ORDER — IOHEXOL 350 MG/ML SOLN
100.0000 mL | Freq: Once | INTRAVENOUS | Status: AC | PRN
  Administered 2019-05-21: 100 mL via INTRAVENOUS

## 2019-05-21 MED ORDER — SODIUM CHLORIDE 0.9 % IV BOLUS
1000.0000 mL | Freq: Once | INTRAVENOUS | Status: AC
  Administered 2019-05-21: 04:00:00 1000 mL via INTRAVENOUS

## 2019-05-21 MED ORDER — SODIUM CHLORIDE 0.9 % IV BOLUS
1000.0000 mL | Freq: Once | INTRAVENOUS | Status: AC
  Administered 2019-05-21: 1000 mL via INTRAVENOUS

## 2019-05-21 NOTE — H&P (View-Only) (Signed)
Full mote to follow Asymptomatic 5.5 cm AAA OK for discharge home, Plan for repair in next 1-2 weeks   [facility]

## 2019-05-21 NOTE — Consult Note (Signed)
Full mote to follow Asymptomatic 5.5 cm AAA OK for discharge home, Plan for repair in next 1-2 weeks   [facility]
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
## 2019-05-21 NOTE — ED Notes (Signed)
Pt is partially dressed in his shirt & jacket, this RN gave him a dry brief, pt has been moved to hall bed 20 while waiting on his son to bring him pants so he can be D/C'd.

## 2019-05-21 NOTE — H&P (View-Only) (Signed)
[facility]  Patient name: Derek Harrell MRN: [id number] DOB: January 03, 1946 Sex: male   REQUESTING PROVIDER:    ER   REASON FOR CONSULT:    AAA  HISTORY OF PRESENT ILLNESS:   Derek Harrell is a 73 y.o. male, who was sent to the emergency department after obtaining a CT scan at his urologist for hematuria which showed a 5.6 cm infrarenal abdominal aortic aneurysm.  This is been followed by Dr. Alvester Chou with cardiology.  He had an ultrasound several months ago that showed a 4.8 cm aneurysm.  The patient is not having any abdominal pain.  The patient takes a statin for hypercholesterolemia..  The patient does have a history of Parkinson's..  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  He also has a history of myasthenia gravis which has been well controlled.  He is medically managed for hypertension.   PAST MEDICAL HISTORY    Past Medical History:  Diagnosis Date  . Abdominal aortic aneurysm ([place])   . Abnormality of gait 04/12/2013  . Cerebrovascular disease   . Constipation   . Dizziness   . Gait disorder   . Hyperlipidemia   . Hypertension   . 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 Myasthenia gravis ([place]) 01/07/2016  . Obesity   . Pseudobulbar affect   . RLS (restless legs syndrome) 06/09/2016  . Sleep apnea    "I tried CPAP; didn't work for me" (08/22/2013)  . Stroke [facility]) ?2009   Left pontine stroke; denies residual on 08/22/2013     FAMILY HISTORY   Family History  Problem Relation Age of Onset  . Heart disease Mother   . Heart disease Father   . Colon cancer Brother   . Heart disease Brother   . Heart disease Brother   . Healthy Son   . Healthy Daughter   . Healthy Daughter     SOCIAL HISTORY:   Social History   Socioeconomic History  . Marital status: Married    Spouse name: Not on file  . Number of  children: 3  . Years of education: college  . Highest education level: Some college, no degree  Occupational History  . Occupation: Retired    Fish farm manager: [employer]  . Financial resource strain: Not on file  . Food insecurity    Worry: Not on file    Inability: Not on file  . Transportation needs    Medical: Not on file    Non-medical: Not on file  Tobacco Use  . Smoking status: Former Smoker    Packs/[place]: 0.06    Years: 26.00    Pack years: 1.56    Types: Cigarettes    Quit date: 03/23/1993    Years since quitting: 26.1  . Smokeless tobacco: Never Used  Substance and Sexual Activity  . Alcohol use: Not Currently  . Drug use: No  . Sexual activity: Yes  Lifestyle  . Physical activity    Days per week: Not on file    Minutes per session: Not on file  . Stress: Not on file  Relationships  . Social Herbalist on phone: Not on file    Gets together: Not on file    Attends religious service: Not on file    Active member of club or organization: Not on file  Attends meetings of clubs or organizations: Not on file    Relationship status: Not on file  . Intimate partner violence    Fear of current or ex partner: Not on file    Emotionally abused: Not on file    Physically abused: Not on file    Forced sexual activity: Not on file  Other Topics Concern  . Not on file  Social History Narrative   Patient is married, has 3 children   Patient is right handed   Education level is some college   Caffeine consumption is 1 cup daily   Pt lives at home with spouse one story home    ALLERGIES:    No Known Allergies  CURRENT MEDICATIONS:    No current facility-administered medications for this encounter.    Current Outpatient Medications  Medication Sig Dispense Refill  . aspirin 81 MG EC tablet Take 1 tablet (81 mg total) by mouth daily. Swallow whole. 30 tablet 12  . Carbidopa-Levodopa ER (RYTARY) 48.75-195 MG CPCR Take 1 capsule by  mouth 5 (five) times daily. 100 capsule 0  . clopidogrel (PLAVIX) 75 MG tablet Take 75 mg by mouth daily.    Marland Kitchen ipratropium (ATROVENT) 0.06 % nasal spray PLACE 2 SPRAYS IN EACH NOSTRIL FOUR TIMES DAILY FOR NASAL CONGESTION (Patient taking differently: Place 2 sprays into both nostrils 4 (four) times daily as needed for rhinitis. ) 15 mL 0  . mirabegron ER (MYRBETRIQ) 50 MG TB24 tablet Take 1 tablet (50 mg total) by mouth daily. 90 tablet 3  . nebivolol (BYSTOLIC) 10 MG tablet Take 10 mg by mouth daily.    . NUPLAZID 34 MG CAPS TAKE 1 CAPSULE BY MOUTH ONCE DAILY (Patient taking differently: Take 34 mg by mouth daily. ) 90 capsule 0  . pyridostigmine (MESTINON) 60 MG tablet TAKE 1 TABLET BY MOUTH AT 8 AM, 1 TABLET AT 2 PM, AND 1 TABLET AT 8 PM (Patient taking differently: Take 60 mg by mouth 2 (two) times daily. ) 270 tablet 1  . rosuvastatin (CRESTOR) 10 MG tablet TAKE 1 TABLET(10 MG) BY MOUTH DAILY (Patient taking differently: Take 10 mg by mouth daily. ) 90 tablet 0  . vitamin B-12 (CYANOCOBALAMIN) 1000 MCG tablet Take 1,000 mcg by mouth daily.    . carbidopa-levodopa (SINEMET CR) 50-200 MG tablet Take 1 tablet by mouth at bedtime. (Patient not taking: Reported on 05/21/2019) 90 tablet 1  . Carbidopa-Levodopa ER (RYTARY) 48.75-195 MG CPCR Take 1 tablet by mouth 5 (five) times daily. (Patient not taking: Reported on 05/21/2019) 420 capsule 1  . sulfamethoxazole-trimethoprim (BACTRIM DS) 800-160 MG tablet Take 1 tablet by mouth 2 (two) times daily. (Patient not taking: Reported on 05/21/2019) 14 tablet 0    REVIEW OF SYSTEMS:   [X]  denotes positive finding, [ ]  denotes negative finding Cardiac  Comments:  Chest pain or chest pressure:    Shortness of breath upon exertion:    Short of breath when lying flat:    Irregular heart rhythm:        Vascular    Pain in calf, thigh, or hip brought on by ambulation:    Pain in feet at night that wakes you up from your sleep:     Blood clot in your  veins:    Leg swelling:         Pulmonary    Oxygen at home:    Productive cough:     Wheezing:  Neurologic    Sudden weakness in arms or legs:     Sudden numbness in arms or legs:     Sudden onset of difficulty speaking or slurred speech:    Temporary loss of vision in one eye:     Problems with dizziness:         Gastrointestinal    Blood in stool:      Vomited blood:         Genitourinary    Burning when urinating:     Blood in urine: x       Psychiatric    Major depression:         Hematologic    Bleeding problems:    Problems with blood clotting too easily:        Skin    Rashes or ulcers:        Constitutional    Fever or chills:     PHYSICAL EXAM:   Vitals:   05/21/19 0645 05/21/19 0700 05/21/19 0800 05/21/19 0820  BP: (!) 184/108 (!) 185/103  (!) 193/96  Pulse:   71 64  Resp: 12 15 12 16   Temp:      TempSrc:      SpO2:   100% 97%  Weight:      Height:        GENERAL: The patient is a well-nourished male, in no acute distress. The vital signs are documented above. CARDIAC: There is a regular rate and rhythm.  VASCULAR: Palpable pedal pulses PULMONARY: Nonlabored respirations ABDOMEN: Soft and non-tender with normal pitched bowel sounds.  MUSCULOSKELETAL: There are no major deformities or cyanosis. NEUROLOGIC: No focal weakness or paresthesias are detected. SKIN: There are no ulcers or rashes noted. PSYCHIATRIC: The patient has a normal affect.  STUDIES:   I have reviewed his CTA with the following findings:  Chest CTA impression:  1. Mild fusiform ectasia of the ascending thoracic aorta measuring 39 mm in diameter. 2. Contained penetrating atherosclerotic ulcer involving the inferolateral aspect of the posterior aortic arch without associated dissection or periaortic stranding. 3. Cardiomegaly. Coronary artery calcifications. Aortic Atherosclerosis (ICD10-I70.0). 4.  Emphysema (ICD10-J43.9).  Abdomen and pelvic CTA  impression:  1. Redemonstrated infrarenal abdominal aortic aneurysm measuring 5.5 cm in diameter. There is a moderate amount of mural thrombus within dominant component of the aneurysm without evidence of abdominal aortic dissection or periaortic stranding. Aortic aneurysm NOS (ICD10-I71.9). 2. Mild ectasia of the left common iliac artery measuring 1.9 cm in diameter. 3. Borderline prostatomegaly with mass effect on the undersurface of the urinary bladder. If not recently performed, further evaluation with DRE is advised. 4. Incidentally noted 2.8 cm left-sided urinary bladder diverticulum. 5. Small hiatal hernia.  CAROTID U/S (2016) No significant stenosis  ASSESSMENT and PLAN   AAA: The patient has a 5.6 cm infrarenal abdominal aortic aneurysm.  I discussed with the patient based on size that this needs to be repaired.  I believe he is a candidate for endovascular repair.  I discussed the details of the procedure as well as the risks and benefits including the risk of bowel ischemia, death, renal insufficiency, and lower extremity ischemia.  All of his questions were answered.  This will be scheduled as an outpatient.  I would like to stop his Plavix for the procedure.   Leia Alf, MD, FACS [facility] [phone] Pager [phone]

## 2019-05-21 NOTE — ED Notes (Signed)
Patient transported to CT 

## 2019-05-21 NOTE — ED Notes (Signed)
Talked to PT's son about picking him up  He says he would call PT's wife and one of them would be on the way

## 2019-05-21 NOTE — ED Notes (Signed)
Pt is sitting up in bed drinking water, while waiting on his son to come get him.

## 2019-05-21 NOTE — Consult Note (Signed)
[facility]  Patient name: Derek Harrell MRN: [id number] DOB: 1946-01-03 Sex: male   REQUESTING PROVIDER:    ER   REASON FOR CONSULT:    AAA  HISTORY OF PRESENT ILLNESS:   Derek Harrell is a 73 y.o. male, who was sent to the emergency department after obtaining a CT scan at his urologist for hematuria which showed a 5.6 cm infrarenal abdominal aortic aneurysm.  This is been followed by Dr. Alvester Chou with cardiology.  He had an ultrasound several months ago that showed a 4.8 cm aneurysm.  The patient is not having any abdominal pain.  The patient takes a statin for hypercholesterolemia..  The patient does have a history of Parkinson's..  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  He also has a history of myasthenia gravis which has been well controlled.  He is medically managed for hypertension.   PAST MEDICAL HISTORY    Past Medical History:  Diagnosis Date  . Abdominal aortic aneurysm ([place])   . Abnormality of gait 04/12/2013  . Cerebrovascular disease   . Constipation   . Dizziness   . Gait disorder   . Hyperlipidemia   . Hypertension   . 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 Myasthenia gravis ([place]) 01/07/2016  . Obesity   . Pseudobulbar affect   . RLS (restless legs syndrome) 06/09/2016  . Sleep apnea    "I tried CPAP; didn't work for me" (08/22/2013)  . Stroke [facility]) ?2009   Left pontine stroke; denies residual on 08/22/2013     FAMILY HISTORY   Family History  Problem Relation Age of Onset  . Heart disease Mother   . Heart disease Father   . Colon cancer Brother   . Heart disease Brother   . Heart disease Brother   . Healthy Son   . Healthy Daughter   . Healthy Daughter     SOCIAL HISTORY:   Social History   Socioeconomic History  . Marital status: Married    Spouse name: Not on file  . Number of  children: 3  . Years of education: college  . Highest education level: Some college, no degree  Occupational History  . Occupation: Retired    Fish farm manager: [employer]  . Financial resource strain: Not on file  . Food insecurity    Worry: Not on file    Inability: Not on file  . Transportation needs    Medical: Not on file    Non-medical: Not on file  Tobacco Use  . Smoking status: Former Smoker    Packs/Yagi: 0.06    Years: 26.00    Pack years: 1.56    Types: Cigarettes    Quit date: 03/23/1993    Years since quitting: 26.1  . Smokeless tobacco: Never Used  Substance and Sexual Activity  . Alcohol use: Not Currently  . Drug use: No  . Sexual activity: Yes  Lifestyle  . Physical activity    Days per week: Not on file    Minutes per session: Not on file  . Stress: Not on file  Relationships  . Social Herbalist on phone: Not on file    Gets together: Not on file    Attends religious service: Not on file    Active member of club or organization: Not on file  Attends meetings of clubs or organizations: Not on file    Relationship status: Not on file  . Intimate partner violence    Fear of current or ex partner: Not on file    Emotionally abused: Not on file    Physically abused: Not on file    Forced sexual activity: Not on file  Other Topics Concern  . Not on file  Social History Narrative   Patient is married, has 3 children   Patient is right handed   Education level is some college   Caffeine consumption is 1 cup daily   Pt lives at home with spouse one story home    ALLERGIES:    No Known Allergies  CURRENT MEDICATIONS:    No current facility-administered medications for this encounter.    Current Outpatient Medications  Medication Sig Dispense Refill  . aspirin 81 MG EC tablet Take 1 tablet (81 mg total) by mouth daily. Swallow whole. 30 tablet 12  . Carbidopa-Levodopa ER (RYTARY) 48.75-195 MG CPCR Take 1 capsule by  mouth 5 (five) times daily. 100 capsule 0  . clopidogrel (PLAVIX) 75 MG tablet Take 75 mg by mouth daily.    Marland Kitchen ipratropium (ATROVENT) 0.06 % nasal spray PLACE 2 SPRAYS IN EACH NOSTRIL FOUR TIMES DAILY FOR NASAL CONGESTION (Patient taking differently: Place 2 sprays into both nostrils 4 (four) times daily as needed for rhinitis. ) 15 mL 0  . mirabegron ER (MYRBETRIQ) 50 MG TB24 tablet Take 1 tablet (50 mg total) by mouth daily. 90 tablet 3  . nebivolol (BYSTOLIC) 10 MG tablet Take 10 mg by mouth daily.    . NUPLAZID 34 MG CAPS TAKE 1 CAPSULE BY MOUTH ONCE DAILY (Patient taking differently: Take 34 mg by mouth daily. ) 90 capsule 0  . pyridostigmine (MESTINON) 60 MG tablet TAKE 1 TABLET BY MOUTH AT 8 AM, 1 TABLET AT 2 PM, AND 1 TABLET AT 8 PM (Patient taking differently: Take 60 mg by mouth 2 (two) times daily. ) 270 tablet 1  . rosuvastatin (CRESTOR) 10 MG tablet TAKE 1 TABLET(10 MG) BY MOUTH DAILY (Patient taking differently: Take 10 mg by mouth daily. ) 90 tablet 0  . vitamin B-12 (CYANOCOBALAMIN) 1000 MCG tablet Take 1,000 mcg by mouth daily.    . carbidopa-levodopa (SINEMET CR) 50-200 MG tablet Take 1 tablet by mouth at bedtime. (Patient not taking: Reported on 05/21/2019) 90 tablet 1  . Carbidopa-Levodopa ER (RYTARY) 48.75-195 MG CPCR Take 1 tablet by mouth 5 (five) times daily. (Patient not taking: Reported on 05/21/2019) 420 capsule 1  . sulfamethoxazole-trimethoprim (BACTRIM DS) 800-160 MG tablet Take 1 tablet by mouth 2 (two) times daily. (Patient not taking: Reported on 05/21/2019) 14 tablet 0    REVIEW OF SYSTEMS:   [X]  denotes positive finding, [ ]  denotes negative finding Cardiac  Comments:  Chest pain or chest pressure:    Shortness of breath upon exertion:    Short of breath when lying flat:    Irregular heart rhythm:        Vascular    Pain in calf, thigh, or hip brought on by ambulation:    Pain in feet at night that wakes you up from your sleep:     Blood clot in your  veins:    Leg swelling:         Pulmonary    Oxygen at home:    Productive cough:     Wheezing:  Neurologic    Sudden weakness in arms or legs:     Sudden numbness in arms or legs:     Sudden onset of difficulty speaking or slurred speech:    Temporary loss of vision in one eye:     Problems with dizziness:         Gastrointestinal    Blood in stool:      Vomited blood:         Genitourinary    Burning when urinating:     Blood in urine: x       Psychiatric    Major depression:         Hematologic    Bleeding problems:    Problems with blood clotting too easily:        Skin    Rashes or ulcers:        Constitutional    Fever or chills:     PHYSICAL EXAM:   Vitals:   05/21/19 0645 05/21/19 0700 05/21/19 0800 05/21/19 0820  BP: (!) 184/108 (!) 185/103  (!) 193/96  Pulse:   71 64  Resp: 12 15 12 16   Temp:      TempSrc:      SpO2:   100% 97%  Weight:      Height:        GENERAL: The patient is a well-nourished male, in no acute distress. The vital signs are documented above. CARDIAC: There is a regular rate and rhythm.  VASCULAR: Palpable pedal pulses PULMONARY: Nonlabored respirations ABDOMEN: Soft and non-tender with normal pitched bowel sounds.  MUSCULOSKELETAL: There are no major deformities or cyanosis. NEUROLOGIC: No focal weakness or paresthesias are detected. SKIN: There are no ulcers or rashes noted. PSYCHIATRIC: The patient has a normal affect.  STUDIES:   I have reviewed his CTA with the following findings:  Chest CTA impression:  1. Mild fusiform ectasia of the ascending thoracic aorta measuring 39 mm in diameter. 2. Contained penetrating atherosclerotic ulcer involving the inferolateral aspect of the posterior aortic arch without associated dissection or periaortic stranding. 3. Cardiomegaly. Coronary artery calcifications. Aortic Atherosclerosis (ICD10-I70.0). 4.  Emphysema (ICD10-J43.9).  Abdomen and pelvic CTA  impression:  1. Redemonstrated infrarenal abdominal aortic aneurysm measuring 5.5 cm in diameter. There is a moderate amount of mural thrombus within dominant component of the aneurysm without evidence of abdominal aortic dissection or periaortic stranding. Aortic aneurysm NOS (ICD10-I71.9). 2. Mild ectasia of the left common iliac artery measuring 1.9 cm in diameter. 3. Borderline prostatomegaly with mass effect on the undersurface of the urinary bladder. If not recently performed, further evaluation with DRE is advised. 4. Incidentally noted 2.8 cm left-sided urinary bladder diverticulum. 5. Small hiatal hernia.  CAROTID U/S (2016) No significant stenosis  ASSESSMENT and PLAN   AAA: The patient has a 5.6 cm infrarenal abdominal aortic aneurysm.  I discussed with the patient based on size that this needs to be repaired.  I believe he is a candidate for endovascular repair.  I discussed the details of the procedure as well as the risks and benefits including the risk of bowel ischemia, death, renal insufficiency, and lower extremity ischemia.  All of his questions were answered.  This will be scheduled as an outpatient.  I would like to stop his Plavix for the procedure.   Leia Alf, MD, FACS [facility] [phone] Pager [phone]

## 2019-05-21 NOTE — Discharge Instructions (Signed)
You have an aneurysm in the blood vessel in your stomach called the Aorta.  This means it is larger than normal.   Please read over the attached instructions to educate yourself about aortic aneurysms.  Pay particular attention to reasons that you need to come back to the emergency department, including sudden abdominal or lower back pain, dizziness, loss of consciousness, or loss of sensation or coldness in her lower feet.  You were seen by one of her vascular surgeons, Dr. Trula Slade, whose office will follow up with you.  They will discuss with you plans for possible operative repair of your aneurysm in the nearby future.  In the meantime, please refrain from smoking.  Refrain from any exertional physical activity, or any activities that require excessive straining -until you are cleared to return by the vascular surgeon.

## 2019-05-22 NOTE — Telephone Encounter (Signed)
I have not seen him in the office for 4 years. Needs ROV to discuss AAA

## 2019-05-23 ENCOUNTER — Other Ambulatory Visit: Payer: Self-pay

## 2019-05-23 ENCOUNTER — Telehealth: Payer: Self-pay

## 2019-05-23 NOTE — Telephone Encounter (Signed)
Spoke with pt wife who is okay with pt appt change for 10/20 to 4:30pm

## 2019-05-23 NOTE — Telephone Encounter (Signed)
PT HAS APPT 10/20 WITH BERRY

## 2019-05-23 NOTE — Telephone Encounter (Signed)
Please schedule patient for OV. Thank you!

## 2019-05-23 NOTE — Telephone Encounter (Signed)
Called patient spouse no answer left message with provider response she can call back to discuss other resource with Corwin Levins.

## 2019-05-24 ENCOUNTER — Encounter: Payer: Self-pay | Admitting: Cardiovascular Disease

## 2019-05-24 ENCOUNTER — Ambulatory Visit: Payer: Medicare Other | Admitting: Cardiovascular Disease

## 2019-05-24 ENCOUNTER — Other Ambulatory Visit: Payer: Self-pay

## 2019-05-24 ENCOUNTER — Encounter: Payer: Self-pay | Admitting: Surgery

## 2019-05-24 ENCOUNTER — Other Ambulatory Visit: Payer: Self-pay | Admitting: Family

## 2019-05-24 DIAGNOSIS — I714 Abdominal aortic aneurysm, without rupture, unspecified: Secondary | ICD-10-CM

## 2019-05-24 DIAGNOSIS — E782 Mixed hyperlipidemia: Secondary | ICD-10-CM

## 2019-05-24 DIAGNOSIS — I10 Essential (primary) hypertension: Secondary | ICD-10-CM

## 2019-05-24 NOTE — Progress Notes (Signed)
05/24/2019 Derek Harrell   09/09/1945  [id number]  Primary Physician [name], MD Primary Cardiologist: Lorretta Harp MD Lupe Carney, [place]  HPI:  Derek Harrell is a 73 y.o.  moderately overweight recently remarried (2 years ago to [place]) [place] American male, father of 54, grandfather to 5 grandchildren, referred through the courtesy of Dr. Pricilla Holm at [facility] for evaluation of an incidentally-noted small abdominal aortic aneurysm on CT scanning. I last saw him in the office 06/05/2015.  He is retired from [employer], where he worked for 39 years. His cardiac risk factor profile is positive for hypertension and hyperlipidemia. He quit smoking 15 years ago and smoked 1/2 to 1 pack a [place] for 25 years prior to that. He does have a family history of heart disease with a father who died of an MI at age 1. He has never had a heart attack, but has had a stroke 4 to 5 years ago without residual neurologic disability. He denies chest pain or shortness of breath. A CT scan was done for evaluation of abdominal pain, which was unremarkable except for the above-noted small abdominal aortic aneurysm.  Recent abdominal ultrasound performed 05/01/14 revealed his abdominal aortic aneurysm maximum dimensions of 3.7 x 3.8 cm.  Since I saw him 4 years ago he has done well.  He recently was seen by urology for hematuria and had an abdominal CT that showed a 5.5 cm infrarenal abdominal aortic aneurysm.  His last abdominal ultrasound performed 10/06/2018 showed the aneurysm to be 4.8 cm.  He did see Dr. Trula Slade who has arranged for him to undergo endoluminal stent grafting next Thursday.  He denies chest pain or shortness of breath.   Current Meds  Medication Sig  . aspirin 81 MG EC tablet Take 1 tablet (81 mg total) by mouth daily. Swallow whole.  . Carbidopa-Levodopa ER (RYTARY) 48.75-195 MG CPCR Take 1 tablet by mouth 5 (five) times daily.  . Carbidopa-Levodopa ER (RYTARY) 48.75-195 MG  CPCR Take 1 capsule by mouth 5 (five) times daily.  . clopidogrel (PLAVIX) 75 MG tablet Take 75 mg by mouth daily.  Marland Kitchen ipratropium (ATROVENT) 0.06 % nasal spray PLACE 2 SPRAYS IN EACH NOSTRIL FOUR TIMES DAILY FOR NASAL CONGESTION (Patient taking differently: Place 2 sprays into both nostrils 4 (four) times daily as needed for rhinitis. )  . mirabegron ER (MYRBETRIQ) 50 MG TB24 tablet Take 1 tablet (50 mg total) by mouth daily.  . nebivolol (BYSTOLIC) 10 MG tablet Take 10 mg by mouth daily.  . NUPLAZID 34 MG CAPS TAKE 1 CAPSULE BY MOUTH ONCE DAILY (Patient taking differently: Take 34 mg by mouth daily. )  . pyridostigmine (MESTINON) 60 MG tablet TAKE 1 TABLET BY MOUTH AT 8 AM, 1 TABLET AT 2 PM, AND 1 TABLET AT 8 PM (Patient taking differently: Take 60 mg by mouth 2 (two) times daily. )  . rosuvastatin (CRESTOR) 10 MG tablet TAKE 1 TABLET(10 MG) BY MOUTH DAILY  . sulfamethoxazole-trimethoprim (BACTRIM DS) 800-160 MG tablet Take 1 tablet by mouth 2 (two) times daily.  . vitamin B-12 (CYANOCOBALAMIN) 1000 MCG tablet Take 1,000 mcg by mouth daily.  . [DISCONTINUED] carbidopa-levodopa (SINEMET CR) 50-200 MG tablet Take 1 tablet by mouth at bedtime.     No Known Allergies  Social History   Socioeconomic History  . Marital status: Married    Spouse name: Not on file  . Number of children: 3  . Years of education: college  .  Highest education level: Some college, no degree  Occupational History  . Occupation: Retired    Fish farm manager: [employer]  . Financial resource strain: Not on file  . Food insecurity    Worry: Not on file    Inability: Not on file  . Transportation needs    Medical: Not on file    Non-medical: Not on file  Tobacco Use  . Smoking status: Former Smoker    Packs/Dery: 0.06    Years: 26.00    Pack years: 1.56    Types: Cigarettes    Quit date: 03/23/1993    Years since quitting: 26.1  . Smokeless tobacco: Never Used  Substance and Sexual Activity  .  Alcohol use: Not Currently  . Drug use: No  . Sexual activity: Yes  Lifestyle  . Physical activity    Days per week: Not on file    Minutes per session: Not on file  . Stress: Not on file  Relationships  . Social Herbalist on phone: Not on file    Gets together: Not on file    Attends religious service: Not on file    Active member of club or organization: Not on file    Attends meetings of clubs or organizations: Not on file    Relationship status: Not on file  . Intimate partner violence    Fear of current or ex partner: Not on file    Emotionally abused: Not on file    Physically abused: Not on file    Forced sexual activity: Not on file  Other Topics Concern  . Not on file  Social History Narrative   Patient is married, has 3 children   Patient is right handed   Education level is some college   Caffeine consumption is 1 cup daily   Pt lives at home with spouse one story home     Review of Systems: General: negative for chills, fever, night sweats or weight changes.  Cardiovascular: negative for chest pain, dyspnea on exertion, edema, orthopnea, palpitations, paroxysmal nocturnal dyspnea or shortness of breath Dermatological: negative for rash Respiratory: negative for cough or wheezing Urologic: negative for hematuria Abdominal: negative for nausea, vomiting, diarrhea, bright red blood per rectum, melena, or hematemesis Neurologic: negative for visual changes, syncope, or dizziness All other systems reviewed and are otherwise negative except as noted above.    Blood pressure 110/60, pulse (!) 57, temperature (!) 97.2 F (36.2 C), height 6\' 1"  (1.854 m), weight 181 lb (82.1 kg).  General appearance: alert and no distress Neck: no adenopathy, no carotid bruit, no JVD, supple, symmetrical, trachea midline and thyroid not enlarged, symmetric, no tenderness/mass/nodules Lungs: clear to auscultation bilaterally Heart: regular rate and rhythm, S1, S2 normal,  no murmur, click, rub or gallop Extremities: extremities normal, atraumatic, no cyanosis or edema Pulses: 2+ and symmetric Skin: Skin color, texture, turgor normal. No rashes or lesions Neurologic: Alert and oriented X 3, normal strength and tone. Normal symmetric reflexes. Normal coordination and gait  EKG not performed today  ASSESSMENT AND PLAN:   AAA (abdominal aortic aneurysm) without rupture (HCC) History of abdominal aortic aneurysm measuring 3.7 x 3.8 back 05/01/2014 now measuring 5.5 cm by CT scan scheduled for endoluminal stent grafting next Thursday by Dr. Trula Slade.  Essential hypertension History of essential hypertension with blood pressure measured today at 110/60.  He is not on antihypertensive medications.  Hyperlipidemia History of hyperlipidemia on statin therapy with lipid profile performed  07/13/2018 revealing total cluster 141, LDL 68 and HDL 57.      Lorretta Harp MD [facility], [facility] 05/24/2019 5:15 PM

## 2019-05-24 NOTE — Patient Instructions (Signed)

## 2019-05-24 NOTE — Assessment & Plan Note (Signed)
History of hyperlipidemia on statin therapy with lipid profile performed 07/13/2018 revealing total cluster 141, LDL 68 and HDL 57.

## 2019-05-24 NOTE — Assessment & Plan Note (Signed)
History of abdominal aortic aneurysm measuring 3.7 x 3.8 back 05/01/2014 now measuring 5.5 cm by CT scan scheduled for endoluminal stent grafting next Thursday by Dr. Trula Slade.

## 2019-05-24 NOTE — Telephone Encounter (Signed)
Routing to CMA 

## 2019-05-24 NOTE — Assessment & Plan Note (Signed)
History of essential hypertension with blood pressure measured today at 110/60.  He is not on antihypertensive medications.

## 2019-05-25 ENCOUNTER — Other Ambulatory Visit: Payer: Self-pay | Admitting: *Deleted

## 2019-05-25 NOTE — Progress Notes (Signed)
[facility] DRUG STORE [place], [address] [place] [address] Phone: [phone] Fax: [phone]  Mady Haagensen [place], [place] [id number] [place] [id number] Phone: [phone] Fax: [phone]      Your procedure is scheduled on June 02, 2019.  Report to [facility] Main Entrance "A" at 5:30 A.M., and check in at the Admitting office.  Call this number if you have problems the morning of surgery:  [phone]  Call [phone] if you have any questions prior to your surgery date Monday-Friday 8am-4pm    Remember:  Do not eat or drink after midnight the night before your surgery    Take these medicines the morning of surgery with A SIP OF WATER :  Carvidopa-Levodopa (Rytary) Ipratropium (Atrovent) nasal spray - if needed Mirabegron ER (Myrbetriq) Nebivolol (Bystolic) Nuplazid Pyridostigmine (Mestinon) Rosuvastatin (Crestor) Sulfamethoxazole-Trimethoprim (Bactrim DS)  Take Aspirin and Plavix as directed by your surgeon!  7 days prior to surgery STOP taking Aleve, Naproxen, Ibuprofen, Motrin, Advil, Goody's, BC's, all herbal medications, fish oil, and all vitamins.    The Morning of Surgery  Do not wear jewelry, make-up or nail polish.  Do not wear lotions, powders, or perfumes/colognes, or deodorant  Do not shave 48 hours prior to surgery.  Men may shave face and neck.  Do not bring valuables to the hospital.  [facility] is not responsible for any belongings or valuables.  If you are a smoker, DO NOT Smoke 24 hours prior to surgery IF you wear a CPAP at night please bring your mask, tubing, and machine the morning of surgery   Remember that you must have someone to transport you home after your surgery, and remain with you for 24 hours if you are discharged the same [place].   Contacts, glasses, hearing aids, dentures or  bridgework may not be worn into surgery.    Leave your suitcase in the car.  After surgery it may be brought to your room.  For patients admitted to the hospital, discharge time will be determined by your treatment team.  Patients discharged the [place] of surgery will not be allowed to drive home.    Special instructions:   [place]- Preparing For Surgery  Before surgery, you can play an important role. Because skin is not sterile, your skin needs to be as free of germs as possible. You can reduce the number of germs on your skin by washing with CHG (chlorahexidine gluconate) Soap before surgery.  CHG is an antiseptic cleaner which kills germs and bonds with the skin to continue killing germs even after washing.    Oral Hygiene is also important to reduce your risk of infection.  Remember - BRUSH YOUR TEETH THE MORNING OF SURGERY WITH YOUR REGULAR TOOTHPASTE  Please do not use if you have an allergy to CHG or antibacterial soaps. If your skin becomes reddened/irritated stop using the CHG.  Do not shave (including legs and underarms) for at least 48 hours prior to first CHG shower. It is OK to shave your face.  Please follow these instructions carefully.   1. Shower the NIGHT BEFORE SURGERY and the MORNING OF SURGERY with CHG Soap.   2. If you chose to wash your hair, wash your hair first as usual with your normal shampoo.  3. After you shampoo, rinse your hair and body thoroughly to remove the shampoo.  4. Use CHG as you would any other liquid soap. You can apply CHG directly to the skin and wash gently with a scrungie or a clean washcloth.   5. Apply the CHG Soap to your body ONLY FROM THE NECK DOWN.  Do not use on open wounds or open sores. Avoid contact with your eyes, ears, mouth and genitals (private parts). Wash Face and genitals (private parts)  with your normal soap.   6. Wash thoroughly, paying special attention to the area where your surgery will be  performed.  7. Thoroughly rinse your body with warm water from the neck down.  8. DO NOT shower/wash with your normal soap after using and rinsing off the CHG Soap.  9. Pat yourself dry with a CLEAN TOWEL.  10. Wear CLEAN PAJAMAS to bed the night before surgery, wear comfortable clothes the morning of surgery  11. Place CLEAN SHEETS on your bed the night of your first shower and DO NOT SLEEP WITH PETS.    [place] of Surgery:  Do not apply any deodorants/lotions. Please shower the morning of surgery with the CHG soap  Please wear clean clothes to the hospital/surgery center.   Remember to brush your teeth WITH YOUR REGULAR TOOTHPASTE.   Please read over the following fact sheets that you were given.

## 2019-05-26 ENCOUNTER — Other Ambulatory Visit: Payer: Self-pay

## 2019-05-26 ENCOUNTER — Encounter (HOSPITAL_COMMUNITY)
Admission: RE | Admit: 2019-05-26 | Discharge: 2019-05-26 | Disposition: A | Discharge status: Home / Self Care | Payer: Medicare Other | Source: Ambulatory Visit | Attending: Surgery | Admitting: Surgery

## 2019-05-26 ENCOUNTER — Encounter (HOSPITAL_COMMUNITY): Payer: Self-pay

## 2019-05-26 DIAGNOSIS — I10 Essential (primary) hypertension: Secondary | ICD-10-CM | POA: Insufficient documentation

## 2019-05-26 DIAGNOSIS — Z7982 Long term (current) use of aspirin: Secondary | ICD-10-CM | POA: Diagnosis not present

## 2019-05-26 DIAGNOSIS — Z01812 Encounter for preprocedural laboratory examination: Secondary | ICD-10-CM | POA: Diagnosis not present

## 2019-05-26 DIAGNOSIS — I2510 Atherosclerotic heart disease of native coronary artery without angina pectoris: Secondary | ICD-10-CM | POA: Insufficient documentation

## 2019-05-26 DIAGNOSIS — G2581 Restless legs syndrome: Secondary | ICD-10-CM | POA: Diagnosis not present

## 2019-05-26 DIAGNOSIS — G20 Parkinson's disease: Secondary | ICD-10-CM | POA: Diagnosis not present

## 2019-05-26 DIAGNOSIS — G7000 Myasthenia gravis without (acute) exacerbation: Secondary | ICD-10-CM | POA: Diagnosis not present

## 2019-05-26 DIAGNOSIS — K449 Diaphragmatic hernia without obstruction or gangrene: Secondary | ICD-10-CM | POA: Diagnosis not present

## 2019-05-26 DIAGNOSIS — Z79899 Other long term (current) drug therapy: Secondary | ICD-10-CM | POA: Insufficient documentation

## 2019-05-26 DIAGNOSIS — Z7902 Long term (current) use of antithrombotics/antiplatelets: Secondary | ICD-10-CM | POA: Diagnosis not present

## 2019-05-26 DIAGNOSIS — I714 Abdominal aortic aneurysm, without rupture: Secondary | ICD-10-CM | POA: Diagnosis not present

## 2019-05-26 DIAGNOSIS — E785 Hyperlipidemia, unspecified: Secondary | ICD-10-CM | POA: Insufficient documentation

## 2019-05-26 DIAGNOSIS — I700 Atherosclerosis of aorta: Secondary | ICD-10-CM | POA: Insufficient documentation

## 2019-05-26 DIAGNOSIS — Z87891 Personal history of nicotine dependence: Secondary | ICD-10-CM | POA: Diagnosis not present

## 2019-05-26 DIAGNOSIS — I491 Atrial premature depolarization: Secondary | ICD-10-CM | POA: Diagnosis not present

## 2019-05-26 DIAGNOSIS — Z8673 Personal history of transient ischemic attack (TIA), and cerebral infarction without residual deficits: Secondary | ICD-10-CM | POA: Insufficient documentation

## 2019-05-26 DIAGNOSIS — J439 Emphysema, unspecified: Secondary | ICD-10-CM | POA: Diagnosis not present

## 2019-05-26 HISTORY — DX: Parkinson's disease: G20

## 2019-05-26 HISTORY — DX: Parkinson's disease without dyskinesia, without mention of fluctuations: G20.A1

## 2019-05-26 LAB — APTT: aPTT: 31 seconds (ref 24–36)

## 2019-05-26 LAB — ABO/RH: ABO/RH(D): O POS

## 2019-05-26 LAB — COMPREHENSIVE METABOLIC PANEL
ALT: 5 U/L (ref 0–44)
AST: 17 U/L (ref 15–41)
Albumin: 3.6 g/dL (ref 3.5–5.0)
Alkaline Phosphatase: 45 U/L (ref 38–126)
Anion gap: 11 (ref 5–15)
BUN: 14 mg/dL (ref 8–23)
CO2: 23 mmol/L (ref 22–32)
Calcium: 9.1 mg/dL (ref 8.9–10.3)
Chloride: 109 mmol/L (ref 98–111)
Creatinine, Ser: 1 mg/dL (ref 0.61–1.24)
GFR calc Af Amer: >60 mL/min (ref >60)
GFR calc non Af Amer: >60 mL/min (ref >60)
Glucose, Bld: 89 mg/dL (ref 70–99)
Potassium: 4.1 mmol/L (ref 3.5–5.1)
Sodium: 143 mmol/L (ref 135–145)
Total Bilirubin: 1.4 mg/dL — ABNORMAL HIGH (ref 0.3–1.2)
Total Protein: 6.1 g/dL — ABNORMAL LOW (ref 6.5–8.1)

## 2019-05-26 LAB — TYPE AND SCREEN
ABO/RH(D): O POS
Antibody Screen: NEGATIVE

## 2019-05-26 LAB — SURGICAL PCR SCREEN
MRSA, PCR: NEGATIVE
Staphylococcus aureus: NEGATIVE

## 2019-05-26 LAB — CBC
HCT: 41.3 % (ref 39.0–52.0)
Hemoglobin: 13.4 g/dL (ref 13.0–17.0)
MCH: 27.7 pg (ref 26.0–34.0)
MCHC: 32.4 g/dL (ref 30.0–36.0)
MCV: 85.3 fL (ref 80.0–100.0)
Platelets: 131 10*3/uL — ABNORMAL LOW (ref 150–400)
RBC: 4.84 MIL/uL (ref 4.22–5.81)
RDW: 13.7 % (ref 11.5–15.5)
WBC: 7.4 10*3/uL (ref 4.0–10.5)
nRBC: 0 % (ref 0.0–0.2)

## 2019-05-26 LAB — PROTIME-INR
INR: 1.2 (ref 0.8–1.2)
Prothrombin Time: 15.3 seconds — ABNORMAL HIGH (ref 11.4–15.2)

## 2019-05-26 MED ORDER — CHLORHEXIDINE GLUCONATE 4 % EX LIQD: 60.0000 mL | Freq: Once | CUTANEOUS | Status: DC

## 2019-05-26 NOTE — Progress Notes (Signed)
PCP - DR CRAWFORD     Cardiologist - DR BERRY          EKG - 10/20 Stress Test - NA ECHO - 1/15 Cardiac Cath - NA  Sleep Study - YES CPAP - NO    Blood Thinner Instructions:PLAVIX,ASA    (OFFICE CLOSED AT PRESENT)WIFE WILL CALL FOR INSTRUCTIONS. NOTE DOES INDICATED TO STOP PLAVIX Aspirin Instructions:  ERAS Protcol -NA  COVID TEST- FOR MON   Anesthesia review: HEART HX  Patient denies shortness of breath, fever, cough and chest pain at PAT appointment   All instructions explained to the patient, with a verbal understanding of the material. Patient agrees to go over the instructions while at home for a better understanding. Patient also instructed to self quarantine after being tested for COVID-19. The opportunity to ask questions was provided.

## 2019-05-27 ENCOUNTER — Ambulatory Visit: Payer: Medicare Other | Admitting: Cardiovascular Disease

## 2019-05-27 NOTE — Anesthesia Preprocedure Evaluation (Addendum)
Anesthesia Evaluation  Patient identified by MRN, date of birth, ID band Patient awake    Reviewed: Allergy & Precautions, NPO status , Patient's Chart, lab work & pertinent test results  Airway Mallampati: II  TM Distance: >3 FB Neck ROM: Full    Dental no notable dental hx.    Pulmonary neg pulmonary ROS, former smoker,    Pulmonary exam normal breath sounds clear to auscultation       Cardiovascular hypertension, + Peripheral Vascular Disease  Normal cardiovascular exam Rhythm:Regular Rate:Normal     Neuro/Psych parkinsons dz  Neuromuscular disease CVA negative psych ROS   GI/Hepatic negative GI ROS, Neg liver ROS,   Endo/Other  negative endocrine ROS  Renal/GU negative Renal ROS  negative genitourinary   Musculoskeletal negative musculoskeletal ROS (+)   Abdominal   Peds negative pediatric ROS (+)  Hematology negative hematology ROS (+)   Anesthesia Other Findings   Reproductive/Obstetrics negative OB ROS                            Anesthesia Physical Anesthesia Plan  ASA: III  Anesthesia Plan: MAC   Post-op Pain Management:    Induction: Intravenous  PONV Risk Score and Plan: 1 and Ondansetron  Airway Management Planned: Simple Face Mask  Additional Equipment:   Intra-op Plan:   Post-operative Plan:   Informed Consent: I have reviewed the patients History and Physical, chart, labs and discussed the procedure including the risks, benefits and alternatives for the proposed anesthesia with the patient or authorized representative who has indicated his/her understanding and acceptance.     Dental advisory given  Plan Discussed with: CRNA and Surgeon  Anesthesia Plan Comments: (PAT note written 05/27/2019 by Myra Gianotti, PA-C. History of parkinsonism and myasthenia gravis. )       Anesthesia Quick Evaluation
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
## 2019-05-30 ENCOUNTER — Other Ambulatory Visit (HOSPITAL_COMMUNITY)
Admission: RE | Admit: 2019-05-30 | Discharge: 2019-05-30 | Disposition: A | Discharge status: Home / Self Care | Payer: Medicare Other | Source: Ambulatory Visit | Attending: Surgery | Admitting: Surgery

## 2019-05-31 LAB — NOVEL CORONAVIRUS, NAA (HOSP ORDER, SEND-OUT TO REF LAB; TAT 18-24 HRS): SARS-CoV-2, NAA: NOT DETECTED

## 2019-06-02 ENCOUNTER — Inpatient Hospital Stay (HOSPITAL_COMMUNITY): Payer: Medicare Other | Admitting: Certified Registered"

## 2019-06-02 ENCOUNTER — Encounter (HOSPITAL_COMMUNITY)
Admission: RE | Disposition: A | Discharge status: Home / Self Care | Payer: Self-pay | Source: Home / Self Care | Attending: Surgery

## 2019-06-02 ENCOUNTER — Encounter (HOSPITAL_COMMUNITY): Payer: Self-pay | Admitting: *Deleted

## 2019-06-02 ENCOUNTER — Inpatient Hospital Stay (HOSPITAL_COMMUNITY)
Admission: RE | Admit: 2019-06-02 | Discharge: 2019-06-03 | DRG: 269 | Disposition: A | Discharge status: Home / Self Care | Payer: Medicare Other | Attending: Surgery | Admitting: Surgery

## 2019-06-02 ENCOUNTER — Inpatient Hospital Stay (HOSPITAL_COMMUNITY): Payer: Medicare Other

## 2019-06-02 ENCOUNTER — Other Ambulatory Visit: Payer: Self-pay

## 2019-06-02 DIAGNOSIS — Z20828 Contact with and (suspected) exposure to other viral communicable diseases: Secondary | ICD-10-CM | POA: Diagnosis present

## 2019-06-02 DIAGNOSIS — Z7982 Long term (current) use of aspirin: Secondary | ICD-10-CM | POA: Diagnosis not present

## 2019-06-02 DIAGNOSIS — G7000 Myasthenia gravis without (acute) exacerbation: Secondary | ICD-10-CM | POA: Diagnosis present

## 2019-06-02 DIAGNOSIS — Z79899 Other long term (current) drug therapy: Secondary | ICD-10-CM | POA: Diagnosis not present

## 2019-06-02 DIAGNOSIS — E7800 Pure hypercholesterolemia, unspecified: Secondary | ICD-10-CM | POA: Diagnosis present

## 2019-06-02 DIAGNOSIS — I714 Abdominal aortic aneurysm, without rupture, unspecified: Secondary | ICD-10-CM | POA: Diagnosis present

## 2019-06-02 DIAGNOSIS — G20 Parkinson's disease: Secondary | ICD-10-CM | POA: Diagnosis present

## 2019-06-02 DIAGNOSIS — Z8673 Personal history of transient ischemic attack (TIA), and cerebral infarction without residual deficits: Secondary | ICD-10-CM

## 2019-06-02 DIAGNOSIS — E785 Hyperlipidemia, unspecified: Secondary | ICD-10-CM | POA: Diagnosis present

## 2019-06-02 DIAGNOSIS — I10 Essential (primary) hypertension: Secondary | ICD-10-CM | POA: Diagnosis present

## 2019-06-02 DIAGNOSIS — Z8249 Family history of ischemic heart disease and other diseases of the circulatory system: Secondary | ICD-10-CM | POA: Diagnosis not present

## 2019-06-02 DIAGNOSIS — G2581 Restless legs syndrome: Secondary | ICD-10-CM | POA: Diagnosis present

## 2019-06-02 HISTORY — PX: ABDOMINAL AORTIC ENDOVASCULAR STENT GRAFT: SHX5707

## 2019-06-02 LAB — BASIC METABOLIC PANEL
Anion gap: 9 (ref 5–15)
BUN: 11 mg/dL (ref 8–23)
CO2: 26 mmol/L (ref 22–32)
Calcium: 8.9 mg/dL (ref 8.9–10.3)
Chloride: 109 mmol/L (ref 98–111)
Creatinine, Ser: 0.86 mg/dL (ref 0.61–1.24)
GFR calc Af Amer: >60 mL/min (ref >60)
GFR calc non Af Amer: >60 mL/min (ref >60)
Glucose, Bld: 93 mg/dL (ref 70–99)
Potassium: 3.8 mmol/L (ref 3.5–5.1)
Sodium: 144 mmol/L (ref 135–145)

## 2019-06-02 LAB — PROTIME-INR
INR: 1.3 — ABNORMAL HIGH (ref 0.8–1.2)
Prothrombin Time: 16.1 seconds — ABNORMAL HIGH (ref 11.4–15.2)

## 2019-06-02 LAB — CBC
HCT: 41.3 % (ref 39.0–52.0)
Hemoglobin: 13.6 g/dL (ref 13.0–17.0)
MCH: 27.2 pg (ref 26.0–34.0)
MCHC: 32.9 g/dL (ref 30.0–36.0)
MCV: 82.6 fL (ref 80.0–100.0)
Platelets: 152 10*3/uL (ref 150–400)
RBC: 5 MIL/uL (ref 4.22–5.81)
RDW: 14.1 % (ref 11.5–15.5)
WBC: 8.1 10*3/uL (ref 4.0–10.5)
nRBC: 0 % (ref 0.0–0.2)

## 2019-06-02 LAB — APTT: aPTT: 33 seconds (ref 24–36)

## 2019-06-02 LAB — MAGNESIUM: Magnesium: 2 mg/dL (ref 1.7–2.4)

## 2019-06-02 SURGERY — INSERTION, ENDOVASCULAR STENT GRAFT, AORTA, ABDOMINAL
Anesthesia: Monitor Anesthesia Care | Site: Abdomen

## 2019-06-02 MED ORDER — SODIUM CHLORIDE 0.9 % IV SOLN
INTRAVENOUS | Status: DC | PRN
  Administered 2019-06-02: 500 mL

## 2019-06-02 MED ORDER — ENOXAPARIN SODIUM 40 MG/0.4ML ~~LOC~~ SOLN
40.0000 mg | SUBCUTANEOUS | Status: DC
  Administered 2019-06-03: 40 mg via SUBCUTANEOUS
  Filled 2019-06-02 (×2): qty 0.4

## 2019-06-02 MED ORDER — OXYCODONE HCL 5 MG PO TABS: 5.0000 mg | ORAL_TABLET | ORAL | Status: DC | PRN

## 2019-06-02 MED ORDER — SODIUM CHLORIDE 0.9 % IV SOLN
INTRAVENOUS | Status: DC
  Administered 2019-06-02: 17:00:00 via INTRAVENOUS

## 2019-06-02 MED ORDER — MIRABEGRON ER 50 MG PO TB24
50.0000 mg | ORAL_TABLET | Freq: Every day | ORAL | Status: DC
  Administered 2019-06-03: 50 mg via ORAL
  Filled 2019-06-02: qty 1

## 2019-06-02 MED ORDER — PROMETHAZINE HCL 25 MG/ML IJ SOLN: 6.2500 mg | INTRAMUSCULAR | Status: DC | PRN

## 2019-06-02 MED ORDER — ACETAMINOPHEN 325 MG PO TABS: 325.0000 mg | ORAL_TABLET | ORAL | Status: DC | PRN

## 2019-06-02 MED ORDER — FENTANYL CITRATE (PF) 100 MCG/2ML IJ SOLN
INTRAMUSCULAR | Status: AC
  Filled 2019-06-02: qty 2

## 2019-06-02 MED ORDER — PYRIDOSTIGMINE BROMIDE 60 MG PO TABS
60.0000 mg | ORAL_TABLET | Freq: Three times a day (TID) | ORAL | Status: DC
  Administered 2019-06-03: 60 mg via ORAL
  Filled 2019-06-02 (×3): qty 1

## 2019-06-02 MED ORDER — CLOPIDOGREL BISULFATE 75 MG PO TABS
75.0000 mg | ORAL_TABLET | Freq: Every day | ORAL | Status: DC
  Administered 2019-06-03: 75 mg via ORAL
  Filled 2019-06-02: qty 1

## 2019-06-02 MED ORDER — LIDOCAINE 2% (20 MG/ML) 5 ML SYRINGE
INTRAMUSCULAR | Status: DC | PRN
  Administered 2019-06-02: 100 mg via INTRAVENOUS

## 2019-06-02 MED ORDER — MIDAZOLAM HCL 2 MG/2ML IJ SOLN
INTRAMUSCULAR | Status: AC
  Filled 2019-06-02: qty 2

## 2019-06-02 MED ORDER — LABETALOL HCL 5 MG/ML IV SOLN: 10.0000 mg | INTRAVENOUS | Status: DC | PRN

## 2019-06-02 MED ORDER — CEFAZOLIN SODIUM-DEXTROSE 2-4 GM/100ML-% IV SOLN
2.0000 g | Freq: Three times a day (TID) | INTRAVENOUS | Status: AC
  Administered 2019-06-02 – 2019-06-03 (×2): 2 g via INTRAVENOUS
  Filled 2019-06-02 (×2): qty 100

## 2019-06-02 MED ORDER — VITAMIN B-12 1000 MCG PO TABS
1000.0000 ug | ORAL_TABLET | Freq: Every day | ORAL | Status: DC
  Administered 2019-06-03: 1000 ug via ORAL
  Filled 2019-06-02: qty 1

## 2019-06-02 MED ORDER — METOPROLOL TARTRATE 5 MG/5ML IV SOLN: 2.0000 mg | INTRAVENOUS | Status: DC | PRN

## 2019-06-02 MED ORDER — CEFAZOLIN SODIUM-DEXTROSE 2-4 GM/100ML-% IV SOLN
2.0000 g | INTRAVENOUS | Status: AC
  Administered 2019-06-02: 08:00:00 2 g via INTRAVENOUS
  Filled 2019-06-02: qty 100

## 2019-06-02 MED ORDER — ONDANSETRON HCL 4 MG/2ML IJ SOLN
INTRAMUSCULAR | Status: DC | PRN
  Administered 2019-06-02: 4 mg via INTRAVENOUS

## 2019-06-02 MED ORDER — PROTAMINE SULFATE 10 MG/ML IV SOLN
INTRAVENOUS | Status: DC | PRN
  Administered 2019-06-02: 20 mg via INTRAVENOUS
  Administered 2019-06-02: 10 mg via INTRAVENOUS
  Administered 2019-06-02: 20 mg via INTRAVENOUS

## 2019-06-02 MED ORDER — FENTANYL CITRATE (PF) 100 MCG/2ML IJ SOLN
25.0000 ug | INTRAMUSCULAR | Status: DC | PRN
  Administered 2019-06-02 (×2): 25 ug via INTRAVENOUS
  Administered 2019-06-02: 50 ug via INTRAVENOUS

## 2019-06-02 MED ORDER — ALUM & MAG HYDROXIDE-SIMETH 200-200-20 MG/5ML PO SUSP: 15.0000 mL | ORAL | Status: DC | PRN

## 2019-06-02 MED ORDER — LACTATED RINGERS IV SOLN
INTRAVENOUS | Status: DC | PRN
  Administered 2019-06-02: 07:00:00 via INTRAVENOUS

## 2019-06-02 MED ORDER — ONDANSETRON HCL 4 MG/2ML IJ SOLN: 4.0000 mg | Freq: Four times a day (QID) | INTRAMUSCULAR | Status: DC | PRN

## 2019-06-02 MED ORDER — MAGNESIUM SULFATE 2 GM/50ML IV SOLN: 2.0000 g | Freq: Every day | INTRAVENOUS | Status: DC | PRN

## 2019-06-02 MED ORDER — NEBIVOLOL HCL 10 MG PO TABS
10.0000 mg | ORAL_TABLET | Freq: Every day | ORAL | Status: DC
  Administered 2019-06-03: 10 mg via ORAL
  Filled 2019-06-02: qty 1

## 2019-06-02 MED ORDER — DOCUSATE SODIUM 100 MG PO CAPS
100.0000 mg | ORAL_CAPSULE | Freq: Every day | ORAL | Status: DC
  Administered 2019-06-03: 100 mg via ORAL
  Filled 2019-06-02: qty 1

## 2019-06-02 MED ORDER — CARBIDOPA-LEVODOPA ER 48.75-195 MG PO CPCR
1.0000 | ORAL_CAPSULE | Freq: Every day | ORAL | Status: DC
  Administered 2019-06-02 – 2019-06-03 (×3): 1 via ORAL
  Filled 2019-06-02 (×4): qty 1

## 2019-06-02 MED ORDER — SUGAMMADEX SODIUM 200 MG/2ML IV SOLN
INTRAVENOUS | Status: DC | PRN
  Administered 2019-06-02: 200 mg via INTRAVENOUS

## 2019-06-02 MED ORDER — CLOPIDOGREL BISULFATE 75 MG PO TABS: 75.0000 mg | ORAL_TABLET | Freq: Every day | ORAL | Status: DC

## 2019-06-02 MED ORDER — PHENOL 1.4 % MT LIQD: 1.0000 | OROMUCOSAL | Status: DC | PRN

## 2019-06-02 MED ORDER — MORPHINE SULFATE (PF) 2 MG/ML IV SOLN
2.0000 mg | INTRAVENOUS | Status: DC | PRN
  Administered 2019-06-02: 2 mg via INTRAVENOUS
  Filled 2019-06-02: qty 1

## 2019-06-02 MED ORDER — HYDRALAZINE HCL 20 MG/ML IJ SOLN
INTRAMUSCULAR | Status: DC | PRN
  Administered 2019-06-02 (×2): 5 mg via INTRAVENOUS

## 2019-06-02 MED ORDER — 0.9 % SODIUM CHLORIDE (POUR BTL) OPTIME
TOPICAL | Status: DC | PRN
  Administered 2019-06-02: 1000 mL

## 2019-06-02 MED ORDER — SODIUM CHLORIDE 0.9 % IV SOLN: 500.0000 mL | Freq: Once | INTRAVENOUS | Status: DC | PRN

## 2019-06-02 MED ORDER — PYRIDOSTIGMINE BROMIDE 60 MG PO TABS: 60.0000 mg | ORAL_TABLET | Freq: Two times a day (BID) | ORAL | Status: DC

## 2019-06-02 MED ORDER — SODIUM CHLORIDE 0.9 % IV SOLN
INTRAVENOUS | Status: AC
  Filled 2019-06-02: qty 1.2

## 2019-06-02 MED ORDER — HYDRALAZINE HCL 20 MG/ML IJ SOLN
INTRAMUSCULAR | Status: AC
  Filled 2019-06-02: qty 1

## 2019-06-02 MED ORDER — HEPARIN SODIUM (PORCINE) 1000 UNIT/ML IJ SOLN
INTRAMUSCULAR | Status: DC | PRN
  Administered 2019-06-02: 9000 [IU] via INTRAVENOUS

## 2019-06-02 MED ORDER — PANTOPRAZOLE SODIUM 40 MG PO TBEC
40.0000 mg | DELAYED_RELEASE_TABLET | Freq: Every day | ORAL | Status: DC
  Administered 2019-06-03: 40 mg via ORAL
  Filled 2019-06-02: qty 1

## 2019-06-02 MED ORDER — EPHEDRINE SULFATE-NACL 50-0.9 MG/10ML-% IV SOSY
PREFILLED_SYRINGE | INTRAVENOUS | Status: DC | PRN
  Administered 2019-06-02 (×4): 5 mg via INTRAVENOUS

## 2019-06-02 MED ORDER — ASPIRIN EC 81 MG PO TBEC
81.0000 mg | DELAYED_RELEASE_TABLET | Freq: Every day | ORAL | Status: DC
  Administered 2019-06-03: 81 mg via ORAL
  Filled 2019-06-02: qty 1

## 2019-06-02 MED ORDER — POTASSIUM CHLORIDE CRYS ER 20 MEQ PO TBCR: 20.0000 meq | EXTENDED_RELEASE_TABLET | Freq: Every day | ORAL | Status: DC | PRN

## 2019-06-02 MED ORDER — PROPOFOL 10 MG/ML IV BOLUS
INTRAVENOUS | Status: DC | PRN
  Administered 2019-06-02: 120 mg via INTRAVENOUS

## 2019-06-02 MED ORDER — SODIUM CHLORIDE 0.9 % IV SOLN: INTRAVENOUS | Status: DC

## 2019-06-02 MED ORDER — IODIXANOL 320 MG/ML IV SOLN
INTRAVENOUS | Status: DC | PRN
  Administered 2019-06-02: 71 mL via INTRA_ARTERIAL

## 2019-06-02 MED ORDER — MIDAZOLAM HCL 2 MG/2ML IJ SOLN
2.0000 mg | Freq: Once | INTRAMUSCULAR | Status: AC
  Administered 2019-06-02: 2 mg via INTRAVENOUS

## 2019-06-02 MED ORDER — GUAIFENESIN-DM 100-10 MG/5ML PO SYRP: 15.0000 mL | ORAL_SOLUTION | ORAL | Status: DC | PRN

## 2019-06-02 MED ORDER — ROCURONIUM BROMIDE 50 MG/5ML IV SOSY
PREFILLED_SYRINGE | INTRAVENOUS | Status: DC | PRN
  Administered 2019-06-02: 60 mg via INTRAVENOUS
  Administered 2019-06-02: 20 mg via INTRAVENOUS

## 2019-06-02 MED ORDER — PIMAVANSERIN TARTRATE 34 MG PO CAPS: 1.0000 | ORAL_CAPSULE | Freq: Every day | ORAL | Status: DC

## 2019-06-02 MED ORDER — ASPIRIN 81 MG PO TBEC: 81.0000 mg | DELAYED_RELEASE_TABLET | Freq: Every day | ORAL | Status: DC

## 2019-06-02 MED ORDER — PROPOFOL 10 MG/ML IV BOLUS
INTRAVENOUS | Status: AC
  Filled 2019-06-02: qty 40

## 2019-06-02 MED ORDER — MIDAZOLAM HCL 5 MG/5ML IJ SOLN: INTRAMUSCULAR | Status: DC | PRN

## 2019-06-02 MED ORDER — FENTANYL CITRATE (PF) 100 MCG/2ML IJ SOLN
INTRAMUSCULAR | Status: DC | PRN
  Administered 2019-06-02: 50 ug via INTRAVENOUS

## 2019-06-02 MED ORDER — ACETAMINOPHEN 325 MG RE SUPP: 325.0000 mg | RECTAL | Status: DC | PRN

## 2019-06-02 MED ORDER — ROSUVASTATIN CALCIUM 5 MG PO TABS: 10.0000 mg | ORAL_TABLET | Freq: Every day | ORAL | Status: DC

## 2019-06-02 MED ORDER — HYDRALAZINE HCL 20 MG/ML IJ SOLN
5.0000 mg | INTRAMUSCULAR | Status: DC | PRN
  Administered 2019-06-03: 5 mg via INTRAVENOUS
  Filled 2019-06-02 (×2): qty 1

## 2019-06-02 MED ORDER — IPRATROPIUM BROMIDE 0.06 % NA SOLN: 2.0000 | Freq: Three times a day (TID) | NASAL | Status: DC

## 2019-06-02 MED ORDER — FENTANYL CITRATE (PF) 250 MCG/5ML IJ SOLN
INTRAMUSCULAR | Status: AC
  Filled 2019-06-02: qty 5

## 2019-06-02 SURGICAL SUPPLY — 62 items
ADH SKN CLS APL DERMABOND .7 (GAUZE/BANDAGES/DRESSINGS) ×1
BLADE CLIPPER SURG (BLADE) ×3 IMPLANT
CANISTER SUCT 3000ML PPV (MISCELLANEOUS) ×3 IMPLANT
CATH BEACON 5.038 65CM KMP-01 (CATHETERS) ×3 IMPLANT
CATH OMNI FLUSH .035X70CM (CATHETERS) ×3 IMPLANT
COVER WAND RF STERILE (DRAPES) ×3 IMPLANT
DERMABOND ADVANCED (GAUZE/BANDAGES/DRESSINGS) ×2
DERMABOND ADVANCED .7 DNX12 (GAUZE/BANDAGES/DRESSINGS) ×1 IMPLANT
DEVICE CLOSURE PERCLS PRGLD 6F (VASCULAR PRODUCTS) IMPLANT
DEVICE TORQUE H2O (MISCELLANEOUS) ×2 IMPLANT
DRAPE ZERO GRAVITY STERILE (DRAPES) ×3 IMPLANT
DRSG TEGADERM 2-3/8X2-3/4 SM (GAUZE/BANDAGES/DRESSINGS) ×6 IMPLANT
DRYSEAL FLEXSHEATH 14FR 33CM (SHEATH) ×2
DRYSEAL FLEXSHEATH 18FR 33CM (SHEATH) ×2
ELECT CAUTERY BLADE 6.4 (BLADE) ×3 IMPLANT
ELECT REM PT RETURN 9FT ADLT (ELECTROSURGICAL) ×6
ELECTRODE REM PT RTRN 9FT ADLT (ELECTROSURGICAL) ×2 IMPLANT
EXCLUDER TNK LEG 28MX14X14 (Endovascular Graft) IMPLANT
EXCLUDER TRUNK LEG 28MX14X14 (Endovascular Graft) ×3 IMPLANT
EXTENDER ENDOPROSTHESIS 14X7 (Endovascular Graft) ×2 IMPLANT
GAUZE SPONGE 2X2 8PLY STRL LF (GAUZE/BANDAGES/DRESSINGS) ×2 IMPLANT
GLOVE BIOGEL PI IND STRL 7.0 (GLOVE) IMPLANT
GLOVE BIOGEL PI IND STRL 7.5 (GLOVE) ×1 IMPLANT
GLOVE BIOGEL PI INDICATOR 7.0 (GLOVE) ×4
GLOVE BIOGEL PI INDICATOR 7.5 (GLOVE) ×8
GLOVE SURG SS PI 7.5 STRL IVOR (GLOVE) ×5 IMPLANT
GOWN STRL REUS W/ TWL LRG LVL3 (GOWN DISPOSABLE) ×2 IMPLANT
GOWN STRL REUS W/ TWL XL LVL3 (GOWN DISPOSABLE) ×2 IMPLANT
GOWN STRL REUS W/TWL LRG LVL3 (GOWN DISPOSABLE) ×6
GOWN STRL REUS W/TWL XL LVL3 (GOWN DISPOSABLE) ×6
GRAFT BALLN CATH 65CM (STENTS) ×1 IMPLANT
GUIDEWIRE ANGLED .035X150CM (WIRE) ×2 IMPLANT
KIT BASIN OR (CUSTOM PROCEDURE TRAY) ×3 IMPLANT
KIT TURNOVER KIT B (KITS) ×3 IMPLANT
LEG CONTRALATERAL 23X10 (Vascular Products) ×2 IMPLANT
LEG CONTRALATERAL 23X12 (Endovascular Graft) ×2 IMPLANT
NS IRRIG 1000ML POUR BTL (IV SOLUTION) ×3 IMPLANT
PACK ENDOVASCULAR (PACKS) ×3 IMPLANT
PAD ARMBOARD 7.5X6 YLW CONV (MISCELLANEOUS) ×6 IMPLANT
PENCIL BUTTON HOLSTER BLD 10FT (ELECTRODE) ×3 IMPLANT
PERCLOSE PROGLIDE 6F (VASCULAR PRODUCTS) ×12
SET MICROPUNCTURE 5F STIFF (MISCELLANEOUS) ×3 IMPLANT
SHEATH BRITE TIP 8FR 23CM (SHEATH) ×3 IMPLANT
SHEATH DRYSEAL FLEX 14FR 33CM (SHEATH) IMPLANT
SHEATH DRYSEAL FLEX 18FR 33CM (SHEATH) IMPLANT
SHEATH PINNACLE 8F 10CM (SHEATH) ×3 IMPLANT
SPONGE GAUZE 2X2 STER 10/PKG (GAUZE/BANDAGES/DRESSINGS) ×4
STENT GRAFT BALLN CATH 65CM (STENTS) ×2
STOPCOCK MORSE 400PSI 3WAY (MISCELLANEOUS) ×3 IMPLANT
SUT PROLENE 5 0 C 1 24 (SUTURE) IMPLANT
SUT VIC AB 2-0 CT1 27 (SUTURE)
SUT VIC AB 2-0 CT1 TAPERPNT 27 (SUTURE) IMPLANT
SUT VIC AB 3-0 SH 27 (SUTURE)
SUT VIC AB 3-0 SH 27X BRD (SUTURE) IMPLANT
SUT VICRYL 4-0 PS2 18IN ABS (SUTURE) IMPLANT
SYR 20ML LL LF (SYRINGE) ×3 IMPLANT
TOWEL GREEN STERILE (TOWEL DISPOSABLE) ×3 IMPLANT
TRAY FOLEY MTR SLVR 16FR STAT (SET/KITS/TRAYS/PACK) ×3 IMPLANT
TUBING HIGH PRESSURE 120CM (CONNECTOR) ×3 IMPLANT
TUBING INJECTOR 48 (MISCELLANEOUS) ×2 IMPLANT
WIRE AMPLATZ SS-J .035X180CM (WIRE) ×6 IMPLANT
WIRE BENTSON .035X145CM (WIRE) ×8 IMPLANT

## 2019-06-02 NOTE — Anesthesia Procedure Notes (Signed)
Procedure Name: Intubation Date/Time: 06/02/2019 7:44 AM Performed by: Griffin Dakin, CRNA Pre-anesthesia Checklist: Patient identified, Emergency Drugs available, Suction available and Patient being monitored Patient Re-evaluated:Patient Re-evaluated prior to induction Oxygen Delivery Method: Circle system utilized Preoxygenation: Pre-oxygenation with 100% oxygen Induction Type: IV induction Ventilation: Mask ventilation without difficulty Laryngoscope Size: Mac and 3 Grade View: Grade I Tube type: Oral Number of attempts: 1 Airway Equipment and Method: Stylet and Oral airway Placement Confirmation: ETT inserted through vocal cords under direct vision,  positive ETCO2 and breath sounds checked- equal and bilateral Secured at: 22 cm Tube secured with: Tape Dental Injury: Teeth and Oropharynx as per pre-operative assessment

## 2019-06-02 NOTE — Interval H&P Note (Signed)
History and Physical Interval Note:  06/02/2019 7:27 AM  Derek Harrell  has presented today for surgery, with the diagnosis of ABDOMINAL AORTIC ANEURYSM.  The various methods of treatment have been discussed with the patient and family. After consideration of risks, benefits and other options for treatment, the patient has consented to  Procedure(s): ABDOMINAL AORTIC ENDOVASCULAR STENT GRAFT (N/A) as a surgical intervention.  The patient's history has been reviewed, patient examined, no change in status, stable for surgery.  I have reviewed the patient's chart and labs.  Questions were answered to the patient's satisfaction.     [name]

## 2019-06-02 NOTE — Anesthesia Procedure Notes (Signed)
Arterial Line Insertion Start/End10/29/2020 6:55 AM, 06/02/2019 7:00 AM Performed by: Myrtie Soman, MD, Griffin Dakin, CRNA, CRNA  Patient location: Pre-op. Preanesthetic checklist: patient identified, IV checked, site marked, risks and benefits discussed, surgical consent, monitors and equipment checked, pre-op evaluation, timeout performed and anesthesia consent Lidocaine 1% used for infiltration Left, radial was placed Catheter size: 20 Fr Hand hygiene performed  and maximum sterile barriers used   Attempts: 1 Procedure performed without using ultrasound guided technique. Following insertion, dressing applied. Post procedure assessment: normal and unchanged  Patient tolerated the procedure well with no immediate complications.

## 2019-06-02 NOTE — Transfer of Care (Signed)
Immediate Anesthesia Transfer of Care Note  Patient: Derek Harrell  Procedure(s) Performed: ABDOMINAL AORTIC ENDOVASCULAR STENT GRAFT (N/A Abdomen)  Patient Location: PACU  Anesthesia Type:General  Level of Consciousness: sedated  Airway & Oxygen Therapy: Patient Spontanous Breathing  Post-op Assessment: Report given to RN and Post -op Vital signs reviewed and stable  Post vital signs: Reviewed and stable  Last Vitals:  Vitals Value Taken Time  BP 149/83 06/02/19 0916  Temp    Pulse 61 06/02/19 0916  Resp 16 06/02/19 0916  SpO2 100 % 06/02/19 0916  Vitals shown include unvalidated device data.  Last Pain:  Vitals:   06/02/19 0617  TempSrc:   PainSc: 0-No pain      Patients Stated Pain Goal: 3 ([id number])  Complications: No apparent anesthesia complications

## 2019-06-02 NOTE — Interval H&P Note (Signed)
History and Physical Interval Note:  06/02/2019 7:26 AM  Derek Harrell  has presented today for surgery, with the diagnosis of ABDOMINAL AORTIC ANEURYSM.  The various methods of treatment have been discussed with the patient and family. After consideration of risks, benefits and other options for treatment, the patient has consented to  Procedure(s): ABDOMINAL AORTIC ENDOVASCULAR STENT GRAFT (N/A) as a surgical intervention.  The patient's history has been reviewed, patient examined, no change in status, stable for surgery.  I have reviewed the patient's chart and labs.  Questions were answered to the patient's satisfaction.     [name]

## 2019-06-02 NOTE — Progress Notes (Signed)
Patient unable to collect urine sample in SS this morning.  Unable to collect at PAT appt.

## 2019-06-02 NOTE — Op Note (Signed)
Patient name: Averill Spade MRN: [id number] DOB: Dec 31, 1945 Sex: male  06/02/2019 Pre-operative Diagnosis: 5.6 cm infrarenal abdominal aortic aneurysm Post-operative diagnosis:  Same Surgeon:  [name] Assistants: Andree Elk twine Procedure:   #1: Endovascular repair of infrarenal abdominal aortic aneurysm   #2: Distal extension   #3: Bilateral ultrasound-guided common femoral artery access   #4: Abdominal aortogram Anesthesia: General Blood Loss: Minimal Specimens: None  Findings: Complete exclusion  Indications: This is a 73 year old gentleman who was found to have a 5.6 cm infrarenal abdominal aortic aneurysm during a urology work-up.  He comes in today for endovascular repair  Devices used:GORE primary right [phone].  Ipsilateral extension, GORE 14 x7.  Contralateral left GORE 23x12, 23x12  Procedure:  The patient was identified in the holding area and taken to [place] 16  The patient was then placed supine on the table. general anesthesia was administered.  The patient was prepped and draped in the usual sterile fashion.  A time out was called and antibiotics were administered.  Ultrasound was used to evaluate bilateral common femoral arteries which were calcified but widely patent.  A #11 blade was used to make a skin nick bilaterally.  Bilateral common femoral arteries were then cannulated under ultrasound guidance the micropuncture needle.  An 018 wire was advanced without resistance and a micropuncture sheath was placed.  The 018 wire was removed and a Bentson wire was inserted.  The subcutaneous tract was dilated with an 8 French dilator and Pro-glide devices were placed at the 11:00 and 1 o'clock position for preclosure.  8 French sheaths were placed bilaterally.  The patient was fully heparinized.  Heparin levels were monitored and dosed according to ACT measurements.  An Amplatz superstiff wire was advanced up the right side and a 18 French dry seal sheath was inserted into the  aorta under fluoroscopic visualization.  An Omni Flush catheter was positioned at the level of L1.  The main body device was prepared on the back table.  This was a Sport and exercise psychologist [id number].  It was inserted into the aorta from the right side.  An abdominal aortogram was performed locating the renal arteries.  The main body device was then deployed landing at the lowest left renal artery.  An additional contrast injection was performed confirming successful deployment.  Next, the contralateral gate was cannulated with a Berenstein 2 catheter and a Glidewire.  Over an Amplatz wire the Berenstein catheter was exchanged out for an Omni Flush catheter which was able to be freely rotated within the main body confirming successful cannulation.  The image detector was then rotated to a right anterior oblique position and a retrograde injection through the sheath was performed locating the left hypogastric artery.  A 12 French dry seal sheath was then inserted and the left limb was prepared on the back table and inserted in deployed landing at the left hypogastric artery.  Next, the remaining portion of the ipsilateral limb was deployed.  A retrograde injection was performed in the left lateral position locating the right hypogastric artery.  A distal extension was prepared on the back table and inserted.  This was deployed landing at the level of the right hypogastric artery.  Next, a MOB balloon was inserted and used to mold the proximal and distal attachment sites as well as device overlap.  A completion arteriogram was performed which showed successful placement of the device with exclusion of the aneurysm sac.  The left limb however appeared to have migrated  proximally and I did not feel there was enough coverage of the left common iliac artery and a extension would be required to land closer to the hypogastric artery on the left.  The image detector was then rotated back to a right anterior oblique position.  A retrograde  injection was performed this time with more caudal rotation.  There was about another centimeter of artery uncovered proximal to the hypogastric artery.  I therefore elected to place additional Gore extension.  This was then molded with the MOB balloon.  A final completion arteriogram was performed which showed successful exclusion of the aneurysm sac.  Bentson wires were then inserted and the sheaths were removed and the Pro-glide devices were used to secure the arteriotomy sites.  Heparin was reversed with 50 mg of protamine.  The groins were then hemostatic.  Cautery was used on the subcutaneous tissue and Dermabond was placed.  The patient had palpable pedal pulses at the procedure.  His successfully extubated and taken to recovery in stable condition.  There were no immediate complications.   Disposition: To PACU stable.   Theotis Burrow, M.D., [facility] Office: [phone] Pager:  [phone]

## 2019-06-02 NOTE — Anesthesia Postprocedure Evaluation (Signed)
Anesthesia Post Note  Patient: Quentez Borcherding  Procedure(s) Performed: ABDOMINAL AORTIC ENDOVASCULAR STENT GRAFT (N/A Abdomen)     Patient location during evaluation: PACU Anesthesia Type: General Level of consciousness: awake and alert Pain management: pain level controlled Vital Signs Assessment: post-procedure vital signs reviewed and stable Respiratory status: spontaneous breathing, nonlabored ventilation, respiratory function stable and patient connected to nasal cannula oxygen Cardiovascular status: blood pressure returned to baseline and stable Postop Assessment: no apparent nausea or vomiting Anesthetic complications: no    Last Vitals:  Vitals:   06/02/19 1230 06/02/19 1245  BP: (!) 150/84 (!) 151/69  Pulse: 83 79  Resp: 18 15  Temp:    SpO2: 98% 98%    Last Pain:  Vitals:   06/02/19 1231  TempSrc:   PainSc: 6                  Crystallee Werden S

## 2019-06-03 ENCOUNTER — Encounter (HOSPITAL_COMMUNITY): Payer: Self-pay | Admitting: Surgery

## 2019-06-03 LAB — BASIC METABOLIC PANEL
Anion gap: 8 (ref 5–15)
BUN: 9 mg/dL (ref 8–23)
CO2: 26 mmol/L (ref 22–32)
Calcium: 8.6 mg/dL — ABNORMAL LOW (ref 8.9–10.3)
Chloride: 109 mmol/L (ref 98–111)
Creatinine, Ser: 0.83 mg/dL (ref 0.61–1.24)
GFR calc Af Amer: >60 mL/min (ref >60)
GFR calc non Af Amer: >60 mL/min (ref >60)
Glucose, Bld: 104 mg/dL — ABNORMAL HIGH (ref 70–99)
Potassium: 3.6 mmol/L (ref 3.5–5.1)
Sodium: 143 mmol/L (ref 135–145)

## 2019-06-03 LAB — CBC
HCT: 35.3 % — ABNORMAL LOW (ref 39.0–52.0)
Hemoglobin: 11.9 g/dL — ABNORMAL LOW (ref 13.0–17.0)
MCH: 27.7 pg (ref 26.0–34.0)
MCHC: 33.7 g/dL (ref 30.0–36.0)
MCV: 82.1 fL (ref 80.0–100.0)
Platelets: 136 10*3/uL — ABNORMAL LOW (ref 150–400)
RBC: 4.3 MIL/uL (ref 4.22–5.81)
RDW: 14 % (ref 11.5–15.5)
WBC: 9 10*3/uL (ref 4.0–10.5)
nRBC: 0 % (ref 0.0–0.2)

## 2019-06-03 LAB — POCT ACTIVATED CLOTTING TIME: Activated Clotting Time: 219 seconds

## 2019-06-03 MED ORDER — OXYCODONE HCL 5 MG PO TABS: 5.0000 mg | ORAL_TABLET | Freq: Four times a day (QID) | ORAL | 0 refills | Status: DC | PRN

## 2019-06-03 NOTE — Progress Notes (Signed)
Patient in a stable condition, discharge education reviewed with patients wife she verbalized understanding, iv removed, tele dc ccmd notified, patient belongings at bedside, patient to be transported home by his wife

## 2019-06-03 NOTE — Progress Notes (Signed)
Wife updated this AM by phone. She is requesting that she be called later today for discharge planning.

## 2019-06-03 NOTE — Discharge Instructions (Signed)
   [facility]   Discharge Instructions  Endovascular Aortic Aneurysm Repair  Please refer to the following instructions for your post-procedure care. Your surgeon or Physician Assistant will discuss any changes with you.  Activity  You are encouraged to walk as much as you can. You can slowly return to normal activities but must avoid strenuous activity and heavy lifting until your doctor tells you it's OK. Avoid activities such as vacuuming or swinging a gold club. It is normal to feel tired for several weeks after your surgery. Do not drive until your doctor gives the OK and you are no longer taking prescription pain medications. It is also normal to have difficulty with sleep habits, eating, and bowel movements after surgery. These will go away with time.  Bathing/Showering  You may shower after you go home. If you have an incision, do not soak in a bathtub, hot tub, or swim until the incision heals completely.  Incision Care  Shower every [place]. Clean your incision with mild soap and water. Pat the area dry with a clean towel. You do not need a bandage unless otherwise instructed. Do not apply any ointments or creams to your incision. If you clothing is irritating, you may cover your incision with a dry gauze pad.  Diet  Resume your normal diet. There are no special food restrictions following this procedure. A low fat/low cholesterol diet is recommended for all patients with vascular disease. In order to heal from your surgery, it is CRITICAL to get adequate nutrition. Your body requires vitamins, minerals, and protein. Vegetables are the best source of vitamins and minerals. Vegetables also provide the perfect balance of protein. Processed food has little nutritional value, so try to avoid this.  Medications  Resume taking all of your medications unless your doctor or nurse practitioner tells you not to. If your incision is causing pain, you may take  over-the-counter pain relievers such as acetaminophen (Tylenol). If you were prescribed a stronger pain medication, please be aware these medications can cause nausea and constipation. Prevent nausea by taking the medication with a snack or meal. Avoid constipation by drinking plenty of fluids and eating foods with a high amount of fiber, such as fruits, vegetables, and grains. Do not take Tylenol if you are taking prescription pain medications.   Follow up  Our office will schedule a follow-up appointment with a C.T. scan 3-4 weeks after your surgery.  Please call us immediately for any of the following conditions  Severe or worsening pain in your legs or feet or in your abdomen back or chest. Increased pain, redness, drainage (pus) from your incision sit. Increased abdominal pain, bloating, nausea, vomiting or persistent diarrhea. Fever of 101 degrees or higher. Swelling in your leg (s),  Reduce your risk of vascular disease  Stop smoking. If you would like help call [facility] at 1-800-QUIT-NOW (1-800-784-8669) or [place] at [phone]. Manage your cholesterol Maintain a desired weight Control your diabetes Keep your blood pressure down  If you have questions, please call the office at [phone].   

## 2019-06-03 NOTE — Progress Notes (Addendum)
[facility]  Subjective  - No new complaints other than he wants to go home.   Objective (!) 157/81 61 97.8 F (36.6 C) (Axillary) 11 95%  Intake/Output Summary (Last 24 hours) at 06/03/2019 0730 Last data filed at 06/03/2019 0400 Gross per 24 hour  Intake 1874.59 ml  Output 2122 ml  Net -247.41 ml    B groins soft without hematoma Doppler right PT brisk, left DP brisk Lungs non labored breathing  Assessment/Planning: POD # 1 EVAR  Patent blood flow Plan for discharge with f/u in 4 weeks and repeat CTA ABD/pelvis  Roxy Horseman 06/03/2019 7:30 AM --  Laboratory Lab Results: Recent Labs    06/02/19 1159 06/03/19 0227  WBC 8.1 9.0  HGB 13.6 11.9*  HCT 41.3 35.3*  PLT 152 136*   BMET Recent Labs    06/02/19 1159 06/03/19 0227  NA 144 143  K 3.8 3.6  CL 109 109  CO2 26 26  GLUCOSE 93 104*  BUN 11 9  CREATININE 0.86 0.83  CALCIUM 8.9 8.6*    COAG Lab Results  Component Value Date   INR 1.3 (H) 06/02/2019   INR 1.2 05/26/2019   INR 1.4 (H) 05/20/2019   No results found for: PTT  I have interviewed the patient and examined the patient. I agree with the findings by the PA.  Agree with plans for discharge today.  Gae Gallop, MD [phone]

## 2019-06-03 NOTE — TOC Transition Note (Signed)
Transition of Care [facility]) - CM/SW Discharge Note Marvetta Gibbons RN, BSN Transitions of Care Unit 4E- RN Case Manager [phone]   Patient Details  Name: Doulgas Budnick MRN: [id number] Date of Birth: January 27, 1946  Transition of Care [facility]) CM/SW Contact:  Dawayne Patricia, RN Phone Number: 06/03/2019, 10:12 AM   Clinical Narrative:    Pt stable for transition home s/p EVAR, lives with spouse. No transition needs noted.    Final next level of care: Home/Self Care Barriers to Discharge: No Barriers Identified   Patient Goals and CMS Choice Patient states their goals for this hospitalization and ongoing recovery are:: want to go home   Choice offered to / list presented to : NA  Discharge Placement           Home/self care            Discharge Plan and Services   Discharge Planning Services: CM Consult Post Acute Care Choice: NA          DME Arranged: N/A DME Agency: NA       HH Arranged: NA HH Agency: NA        Social Determinants of Health (SDOH) Interventions     Readmission Risk Interventions Readmission Risk Prevention Plan 06/03/2019  Transportation Screening Complete  PCP or Specialist Appt within 3-5 Days Complete  HRI or Home Care Consult Complete  Social Work Consult for [place] Planning/Counseling Complete  Palliative Care Screening Not Applicable  Medication Review Press photographer) Complete  Some recent data might be hidden

## 2019-06-03 NOTE — Plan of Care (Signed)
  Problem: Education: Goal: Knowledge of General Education information will improve Description: Including pain rating scale, medication(s)/side effects and non-pharmacologic comfort measures Outcome: Completed/Met   Problem: Health Behavior/Discharge Planning: Goal: Ability to manage health-related needs will improve Outcome: Completed/Met   Problem: Clinical Measurements: Goal: Ability to maintain clinical measurements within normal limits will improve Outcome: Completed/Met Goal: Will remain free from infection Outcome: Completed/Met Goal: Diagnostic test results will improve Outcome: Completed/Met Goal: Respiratory complications will improve Outcome: Completed/Met Goal: Cardiovascular complication will be avoided Outcome: Completed/Met   Problem: Nutrition: Goal: Adequate nutrition will be maintained Outcome: Completed/Met   Problem: Coping: Goal: Level of anxiety will decrease Outcome: Completed/Met   Problem: Elimination: Goal: Will not experience complications related to bowel motility Outcome: Completed/Met Goal: Will not experience complications related to urinary retention Outcome: Completed/Met   Problem: Safety: Goal: Ability to remain free from injury will improve Outcome: Completed/Met   Problem: Pain Managment: Goal: General experience of comfort will improve Outcome: Completed/Met   Problem: Skin Integrity: Goal: Risk for impaired skin integrity will decrease Outcome: Completed/Met   Problem: Education: Goal: Required Educational Video(s) Outcome: Completed/Met   Problem: Clinical Measurements: Goal: Ability to maintain clinical measurements within normal limits will improve Outcome: Completed/Met Goal: Postoperative complications will be avoided or minimized Outcome: Completed/Met   Problem: Skin Integrity: Goal: Demonstration of wound healing without infection will improve Outcome: Completed/Met

## 2019-06-06 ENCOUNTER — Inpatient Hospital Stay (HOSPITAL_COMMUNITY): Payer: Medicare Other

## 2019-06-06 ENCOUNTER — Encounter (HOSPITAL_COMMUNITY): Payer: Self-pay | Admitting: *Deleted

## 2019-06-06 ENCOUNTER — Telehealth: Payer: Self-pay | Admitting: *Deleted

## 2019-06-06 ENCOUNTER — Telehealth: Payer: Self-pay | Admitting: Neurology

## 2019-06-06 ENCOUNTER — Emergency Department (HOSPITAL_COMMUNITY): Payer: Medicare Other

## 2019-06-06 ENCOUNTER — Other Ambulatory Visit: Payer: Self-pay | Admitting: Surgery

## 2019-06-06 ENCOUNTER — Inpatient Hospital Stay (HOSPITAL_COMMUNITY)
Admission: EM | Admit: 2019-06-06 | Discharge: 2019-07-25 | DRG: 004 | Disposition: A | Discharge status: Hospice | Payer: Medicare Other | Attending: Internal Medicine | Admitting: Internal Medicine

## 2019-06-06 ENCOUNTER — Other Ambulatory Visit: Payer: Self-pay

## 2019-06-06 DIAGNOSIS — G7000 Myasthenia gravis without (acute) exacerbation: Secondary | ICD-10-CM | POA: Diagnosis present

## 2019-06-06 DIAGNOSIS — Z9049 Acquired absence of other specified parts of digestive tract: Secondary | ICD-10-CM

## 2019-06-06 DIAGNOSIS — I714 Abdominal aortic aneurysm, without rupture, unspecified: Secondary | ICD-10-CM

## 2019-06-06 DIAGNOSIS — G20 Parkinson's disease: Secondary | ICD-10-CM | POA: Diagnosis not present

## 2019-06-06 DIAGNOSIS — Z515 Encounter for palliative care: Secondary | ICD-10-CM | POA: Diagnosis present

## 2019-06-06 DIAGNOSIS — I471 Supraventricular tachycardia: Secondary | ICD-10-CM | POA: Diagnosis not present

## 2019-06-06 DIAGNOSIS — E876 Hypokalemia: Secondary | ICD-10-CM | POA: Diagnosis not present

## 2019-06-06 DIAGNOSIS — E860 Dehydration: Secondary | ICD-10-CM | POA: Diagnosis not present

## 2019-06-06 DIAGNOSIS — Z9911 Dependence on respirator [ventilator] status: Secondary | ICD-10-CM | POA: Diagnosis not present

## 2019-06-06 DIAGNOSIS — R69 Illness, unspecified: Secondary | ICD-10-CM | POA: Insufficient documentation

## 2019-06-06 DIAGNOSIS — R40243 Glasgow coma scale score 3-8, unspecified time: Secondary | ICD-10-CM | POA: Diagnosis not present

## 2019-06-06 DIAGNOSIS — Z8673 Personal history of transient ischemic attack (TIA), and cerebral infarction without residual deficits: Secondary | ICD-10-CM

## 2019-06-06 DIAGNOSIS — J189 Pneumonia, unspecified organism: Secondary | ICD-10-CM

## 2019-06-06 DIAGNOSIS — G9340 Encephalopathy, unspecified: Secondary | ICD-10-CM

## 2019-06-06 DIAGNOSIS — J9690 Respiratory failure, unspecified, unspecified whether with hypoxia or hypercapnia: Secondary | ICD-10-CM

## 2019-06-06 DIAGNOSIS — R570 Cardiogenic shock: Secondary | ICD-10-CM | POA: Diagnosis not present

## 2019-06-06 DIAGNOSIS — I472 Ventricular tachycardia: Secondary | ICD-10-CM | POA: Diagnosis not present

## 2019-06-06 DIAGNOSIS — R6521 Severe sepsis with septic shock: Secondary | ICD-10-CM | POA: Diagnosis not present

## 2019-06-06 DIAGNOSIS — Z8249 Family history of ischemic heart disease and other diseases of the circulatory system: Secondary | ICD-10-CM

## 2019-06-06 DIAGNOSIS — F05 Delirium due to known physiological condition: Secondary | ICD-10-CM | POA: Diagnosis not present

## 2019-06-06 DIAGNOSIS — Z7189 Other specified counseling: Secondary | ICD-10-CM

## 2019-06-06 DIAGNOSIS — Z66 Do not resuscitate: Secondary | ICD-10-CM | POA: Diagnosis not present

## 2019-06-06 DIAGNOSIS — J9621 Acute and chronic respiratory failure with hypoxia: Secondary | ICD-10-CM | POA: Diagnosis not present

## 2019-06-06 DIAGNOSIS — R4182 Altered mental status, unspecified: Secondary | ICD-10-CM

## 2019-06-06 DIAGNOSIS — L89322 Pressure ulcer of left buttock, stage 2: Secondary | ICD-10-CM | POA: Diagnosis present

## 2019-06-06 DIAGNOSIS — I469 Cardiac arrest, cause unspecified: Secondary | ICD-10-CM

## 2019-06-06 DIAGNOSIS — E870 Hyperosmolality and hypernatremia: Secondary | ICD-10-CM | POA: Diagnosis not present

## 2019-06-06 DIAGNOSIS — Z430 Encounter for attention to tracheostomy: Secondary | ICD-10-CM

## 2019-06-06 DIAGNOSIS — Z9119 Patient's noncompliance with other medical treatment and regimen: Secondary | ICD-10-CM

## 2019-06-06 DIAGNOSIS — G9341 Metabolic encephalopathy: Secondary | ICD-10-CM | POA: Diagnosis not present

## 2019-06-06 DIAGNOSIS — D500 Iron deficiency anemia secondary to blood loss (chronic): Secondary | ICD-10-CM | POA: Diagnosis not present

## 2019-06-06 DIAGNOSIS — Z452 Encounter for adjustment and management of vascular access device: Secondary | ICD-10-CM

## 2019-06-06 DIAGNOSIS — E46 Unspecified protein-calorie malnutrition: Secondary | ICD-10-CM | POA: Diagnosis not present

## 2019-06-06 DIAGNOSIS — R001 Bradycardia, unspecified: Secondary | ICD-10-CM | POA: Diagnosis not present

## 2019-06-06 DIAGNOSIS — Z23 Encounter for immunization: Secondary | ICD-10-CM

## 2019-06-06 DIAGNOSIS — Z79899 Other long term (current) drug therapy: Secondary | ICD-10-CM

## 2019-06-06 DIAGNOSIS — Z87891 Personal history of nicotine dependence: Secondary | ICD-10-CM

## 2019-06-06 DIAGNOSIS — J0410 Acute tracheitis without obstruction: Secondary | ICD-10-CM | POA: Diagnosis not present

## 2019-06-06 DIAGNOSIS — I442 Atrioventricular block, complete: Secondary | ICD-10-CM | POA: Diagnosis present

## 2019-06-06 DIAGNOSIS — Z20828 Contact with and (suspected) exposure to other viral communicable diseases: Secondary | ICD-10-CM | POA: Diagnosis present

## 2019-06-06 DIAGNOSIS — T17990A Other foreign object in respiratory tract, part unspecified in causing asphyxiation, initial encounter: Secondary | ICD-10-CM | POA: Diagnosis not present

## 2019-06-06 DIAGNOSIS — D649 Anemia, unspecified: Secondary | ICD-10-CM | POA: Diagnosis not present

## 2019-06-06 DIAGNOSIS — G2581 Restless legs syndrome: Secondary | ICD-10-CM | POA: Diagnosis present

## 2019-06-06 DIAGNOSIS — G931 Anoxic brain damage, not elsewhere classified: Secondary | ICD-10-CM | POA: Diagnosis not present

## 2019-06-06 DIAGNOSIS — R569 Unspecified convulsions: Secondary | ICD-10-CM | POA: Diagnosis present

## 2019-06-06 DIAGNOSIS — Z7902 Long term (current) use of antithrombotics/antiplatelets: Secondary | ICD-10-CM

## 2019-06-06 DIAGNOSIS — J690 Pneumonitis due to inhalation of food and vomit: Secondary | ICD-10-CM | POA: Diagnosis not present

## 2019-06-06 DIAGNOSIS — E878 Other disorders of electrolyte and fluid balance, not elsewhere classified: Secondary | ICD-10-CM | POA: Diagnosis not present

## 2019-06-06 DIAGNOSIS — Z8679 Personal history of other diseases of the circulatory system: Secondary | ICD-10-CM

## 2019-06-06 DIAGNOSIS — I959 Hypotension, unspecified: Secondary | ICD-10-CM | POA: Diagnosis not present

## 2019-06-06 DIAGNOSIS — G4733 Obstructive sleep apnea (adult) (pediatric): Secondary | ICD-10-CM | POA: Diagnosis present

## 2019-06-06 DIAGNOSIS — R5381 Other malaise: Secondary | ICD-10-CM | POA: Diagnosis present

## 2019-06-06 DIAGNOSIS — J9601 Acute respiratory failure with hypoxia: Secondary | ICD-10-CM

## 2019-06-06 DIAGNOSIS — R403 Persistent vegetative state: Secondary | ICD-10-CM | POA: Diagnosis not present

## 2019-06-06 DIAGNOSIS — E785 Hyperlipidemia, unspecified: Secondary | ICD-10-CM | POA: Diagnosis present

## 2019-06-06 DIAGNOSIS — F0280 Dementia in other diseases classified elsewhere without behavioral disturbance: Secondary | ICD-10-CM | POA: Diagnosis not present

## 2019-06-06 DIAGNOSIS — R0902 Hypoxemia: Secondary | ICD-10-CM | POA: Diagnosis not present

## 2019-06-06 DIAGNOSIS — Z4659 Encounter for fitting and adjustment of other gastrointestinal appliance and device: Secondary | ICD-10-CM

## 2019-06-06 DIAGNOSIS — I161 Hypertensive emergency: Principal | ICD-10-CM | POA: Diagnosis present

## 2019-06-06 DIAGNOSIS — L89892 Pressure ulcer of other site, stage 2: Secondary | ICD-10-CM | POA: Diagnosis present

## 2019-06-06 DIAGNOSIS — B370 Candidal stomatitis: Secondary | ICD-10-CM | POA: Diagnosis not present

## 2019-06-06 DIAGNOSIS — R0600 Dyspnea, unspecified: Secondary | ICD-10-CM | POA: Diagnosis not present

## 2019-06-06 DIAGNOSIS — Z789 Other specified health status: Secondary | ICD-10-CM

## 2019-06-06 DIAGNOSIS — R64 Cachexia: Secondary | ICD-10-CM | POA: Diagnosis not present

## 2019-06-06 DIAGNOSIS — F0390 Unspecified dementia without behavioral disturbance: Secondary | ICD-10-CM

## 2019-06-06 DIAGNOSIS — L8990 Pressure ulcer of unspecified site, unspecified stage: Secondary | ICD-10-CM | POA: Insufficient documentation

## 2019-06-06 DIAGNOSIS — R092 Respiratory arrest: Secondary | ICD-10-CM | POA: Diagnosis not present

## 2019-06-06 DIAGNOSIS — Z800 Family history of malignant neoplasm of digestive organs: Secondary | ICD-10-CM

## 2019-06-06 DIAGNOSIS — I10 Essential (primary) hypertension: Secondary | ICD-10-CM | POA: Diagnosis present

## 2019-06-06 DIAGNOSIS — Z6823 Body mass index (BMI) 23.0-23.9, adult: Secondary | ICD-10-CM

## 2019-06-06 DIAGNOSIS — R1310 Dysphagia, unspecified: Secondary | ICD-10-CM | POA: Diagnosis present

## 2019-06-06 DIAGNOSIS — D638 Anemia in other chronic diseases classified elsewhere: Secondary | ICD-10-CM | POA: Diagnosis present

## 2019-06-06 LAB — CBC WITH DIFFERENTIAL/PLATELET
Abs Immature Granulocytes: 0.06 10*3/uL (ref 0.00–0.07)
Basophils Absolute: 0 10*3/uL (ref 0.0–0.1)
Basophils Relative: 0 %
Eosinophils Absolute: 0.3 10*3/uL (ref 0.0–0.5)
Eosinophils Relative: 2 %
HCT: 40.3 % (ref 39.0–52.0)
Hemoglobin: 13.3 g/dL (ref 13.0–17.0)
Immature Granulocytes: 1 %
Lymphocytes Relative: 9 %
Lymphs Abs: 1 10*3/uL (ref 0.7–4.0)
MCH: 27.3 pg (ref 26.0–34.0)
MCHC: 33 g/dL (ref 30.0–36.0)
MCV: 82.6 fL (ref 80.0–100.0)
Monocytes Absolute: 1.2 10*3/uL — ABNORMAL HIGH (ref 0.1–1.0)
Monocytes Relative: 10 %
Neutro Abs: 9.2 10*3/uL — ABNORMAL HIGH (ref 1.7–7.7)
Neutrophils Relative %: 78 %
Platelets: 187 10*3/uL (ref 150–400)
RBC: 4.88 MIL/uL (ref 4.22–5.81)
RDW: 13.2 % (ref 11.5–15.5)
WBC: 11.7 10*3/uL — ABNORMAL HIGH (ref 4.0–10.5)
nRBC: 0 % (ref 0.0–0.2)

## 2019-06-06 LAB — URINALYSIS, ROUTINE W REFLEX MICROSCOPIC
Bacteria, UA: NONE SEEN
Bilirubin Urine: NEGATIVE
Glucose, UA: NEGATIVE mg/dL
Ketones, ur: 5 mg/dL — AB
Leukocytes,Ua: NEGATIVE
Nitrite: NEGATIVE
Protein, ur: 30 mg/dL — AB
Specific Gravity, Urine: 1.018 (ref 1.005–1.030)
pH: 5 (ref 5.0–8.0)

## 2019-06-06 LAB — COMPREHENSIVE METABOLIC PANEL
ALT: 8 U/L (ref 0–44)
AST: 39 U/L (ref 15–41)
Albumin: 3.2 g/dL — ABNORMAL LOW (ref 3.5–5.0)
Alkaline Phosphatase: 46 U/L (ref 38–126)
Anion gap: 12 (ref 5–15)
BUN: 19 mg/dL (ref 8–23)
CO2: 22 mmol/L (ref 22–32)
Calcium: 8.6 mg/dL — ABNORMAL LOW (ref 8.9–10.3)
Chloride: 105 mmol/L (ref 98–111)
Creatinine, Ser: 0.87 mg/dL (ref 0.61–1.24)
GFR calc Af Amer: >60 mL/min (ref >60)
GFR calc non Af Amer: >60 mL/min (ref >60)
Glucose, Bld: 118 mg/dL — ABNORMAL HIGH (ref 70–99)
Potassium: 5 mmol/L (ref 3.5–5.1)
Sodium: 139 mmol/L (ref 135–145)
Total Bilirubin: 2.1 mg/dL — ABNORMAL HIGH (ref 0.3–1.2)
Total Protein: 6.6 g/dL (ref 6.5–8.1)

## 2019-06-06 LAB — TROPONIN I (HIGH SENSITIVITY)
Troponin I (High Sensitivity): 21 ng/L — ABNORMAL HIGH (ref <18)
Troponin I (High Sensitivity): 21 ng/L — ABNORMAL HIGH (ref <18)

## 2019-06-06 LAB — RAPID URINE DRUG SCREEN, HOSP PERFORMED
Amphetamines: NOT DETECTED
Barbiturates: NOT DETECTED
Benzodiazepines: NOT DETECTED
Cocaine: NOT DETECTED
Opiates: NOT DETECTED
Tetrahydrocannabinol: NOT DETECTED

## 2019-06-06 LAB — MAGNESIUM: Magnesium: 2.4 mg/dL (ref 1.7–2.4)

## 2019-06-06 LAB — SARS CORONAVIRUS 2 (TAT 6-24 HRS): SARS Coronavirus 2: NEGATIVE

## 2019-06-06 LAB — LACTIC ACID, PLASMA: Lactic Acid, Venous: 1.5 mmol/L (ref 0.5–1.9)

## 2019-06-06 LAB — GLUCOSE, CAPILLARY: Glucose-Capillary: 118 mg/dL — ABNORMAL HIGH (ref 70–99)

## 2019-06-06 MED ORDER — SODIUM CHLORIDE 0.9 % IV SOLN: Freq: Once | INTRAVENOUS | Status: DC

## 2019-06-06 MED ORDER — NEBIVOLOL HCL 10 MG PO TABS
10.0000 mg | ORAL_TABLET | Freq: Every day | ORAL | Status: DC
  Filled 2019-06-06: qty 1

## 2019-06-06 MED ORDER — CLOPIDOGREL BISULFATE 75 MG PO TABS
75.0000 mg | ORAL_TABLET | Freq: Every day | ORAL | Status: DC
  Filled 2019-06-06: qty 1

## 2019-06-06 MED ORDER — ROSUVASTATIN CALCIUM 5 MG PO TABS
10.0000 mg | ORAL_TABLET | Freq: Every day | ORAL | Status: DC
  Filled 2019-06-06: qty 2

## 2019-06-06 MED ORDER — LACTATED RINGERS IV SOLN
INTRAVENOUS | Status: DC
  Administered 2019-06-06: 19:00:00 via INTRAVENOUS

## 2019-06-06 MED ORDER — CLEVIDIPINE BUTYRATE 0.5 MG/ML IV EMUL
0.0000 mg/h | INTRAVENOUS | Status: DC
  Administered 2019-06-06: 1 mg/h via INTRAVENOUS
  Administered 2019-06-06: 4 mg/h via INTRAVENOUS
  Administered 2019-06-07: 1 mg/h via INTRAVENOUS
  Filled 2019-06-06 (×6): qty 50

## 2019-06-06 MED ORDER — MIRABEGRON ER 50 MG PO TB24
50.0000 mg | ORAL_TABLET | Freq: Every day | ORAL | Status: DC
  Administered 2019-06-08 – 2019-06-09 (×2): 50 mg via ORAL
  Filled 2019-06-06 (×3): qty 1

## 2019-06-06 MED ORDER — PYRIDOSTIGMINE BROMIDE 60 MG PO TABS
60.0000 mg | ORAL_TABLET | Freq: Two times a day (BID) | ORAL | Status: DC
  Administered 2019-06-06: 60 mg via ORAL
  Filled 2019-06-06 (×2): qty 1

## 2019-06-06 MED ORDER — ENOXAPARIN SODIUM 40 MG/0.4ML ~~LOC~~ SOLN
40.0000 mg | SUBCUTANEOUS | Status: DC
  Administered 2019-06-06 – 2019-06-08 (×3): 40 mg via SUBCUTANEOUS
  Filled 2019-06-06 (×3): qty 0.4

## 2019-06-06 MED ORDER — CARBIDOPA-LEVODOPA ER 48.75-195 MG PO CPCR
1.0000 | ORAL_CAPSULE | Freq: Every day | ORAL | Status: DC
  Filled 2019-06-06 (×4): qty 1

## 2019-06-06 MED ORDER — HYDRALAZINE HCL 20 MG/ML IJ SOLN: 20.0000 mg | Freq: Once | INTRAMUSCULAR | Status: DC

## 2019-06-06 MED ORDER — PIMAVANSERIN TARTRATE 34 MG PO CAPS
34.0000 mg | ORAL_CAPSULE | Freq: Every day | ORAL | Status: DC
  Administered 2019-06-08 – 2019-06-10 (×3): 34 mg via ORAL
  Filled 2019-06-06 (×6): qty 1

## 2019-06-06 MED ORDER — CARBIDOPA-LEVODOPA ER 48.75-195 MG PO CPCR
1.0000 | ORAL_CAPSULE | Freq: Once | ORAL | Status: AC
  Administered 2019-06-06: 1 via ORAL

## 2019-06-06 MED ORDER — ASPIRIN EC 81 MG PO TBEC: 81.0000 mg | DELAYED_RELEASE_TABLET | Freq: Every day | ORAL | Status: DC

## 2019-06-06 NOTE — Telephone Encounter (Signed)
Spoke with patients wife and she was crying and very upset. She was unsure what to do because patient is in so much pain. I advised her to reach out to [facility] because that is Dr Aleen Campi office and he is who did the procedure and see what they would like her to do. Calmed the wife as best I could letting her know that if she has any issues to call the office back and if he gets worse call 911.

## 2019-06-06 NOTE — ED Triage Notes (Signed)
PT from  [place] . EMS reported a fever 101.7. Pt was given 1000mg  tylenol PO  By EMS.  Wife also reported Pt had hernia surgery  Last week. Wife reports Pt has not been as alert as normal.  EMS also reported wife also reported PT was at his normal  Base line. PT snoring is base line .

## 2019-06-06 NOTE — Telephone Encounter (Signed)
Call from patient 's wife that he "talking out of his head, unable to get up, very poor po intake since discharge from hospital and has not had a BM". Difficult to get a clear picture what is going on from caller. She denies fever or chills, states he has only taken Tylenol PM for pain. States he is "bed ridden" she can not get him up. He wears depends and has Parkinson's disease. I instructed her to call 911 and have patient taken to [facility] ER for evaluation.

## 2019-06-06 NOTE — ED Notes (Signed)
Got patient undress on the monitor did ekg shown to Dr Berlin Hun  Patient is resting with nurse at bedside

## 2019-06-06 NOTE — ED Triage Notes (Signed)
TC to wife . Wife reported pt poor po intake since surgery. Wife feels Pt is dehydrated.  Pt also reports snoring is normal for him . Pt refuses to use c-pap
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
## 2019-06-06 NOTE — Telephone Encounter (Signed)
Left message for a return call, the patient should be returning a call to [facility] in order to schedule a appointment.

## 2019-06-06 NOTE — Progress Notes (Signed)
Patient has NPO orders. Unable to pass bedside YALE swallow. Medication held at this time. [place] notified.

## 2019-06-06 NOTE — Telephone Encounter (Signed)
Patient's daughter Derek Harrell called regarding her dad and the referral to Vascular and Vein and him having surgery. She said that she was returning a call to our office. Please call [phone]. Thank you
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
## 2019-06-06 NOTE — Progress Notes (Addendum)
eLink Physician-Brief Progress Note Patient Name: Derek Harrell DOB: 1946-03-14 MRN: [id number]   Date of Service  06/06/2019  HPI/Events of Note  Multiple issues: 1. OSA - Patient on home CPAP and 2. Frequent PVC's. Last K+ = 5.0.   eICU Interventions  Will order: 1. CPAP Q HS per home settings.  2. Mg++ level STAT.      Intervention Category Major Interventions: Other:  Lysle Dingwall 06/06/2019, 8:09 PM

## 2019-06-06 NOTE — Consult Note (Signed)
Patient is a 73 year old male who is postoperative Hollander #4 from an endovascular aneurysm stent graft repair by Dr. Trula Slade.  Patient presented to the ER today with fever confusion.  He apparently has a baseline element of dementia.  He was also noted to be hypertensive.  He has no abdominal pain.  Patient is alert and oriented x 0.  I am not sure of his baseline.  Physical Exam:  Vitals:   06/06/19 1550 06/06/19 1600 06/06/19 1610 06/06/19 1620  BP: (!) 161/87 (!) 171/94 (!) 151/85 (!) 165/93  Pulse: 77 78 76 75  Resp: 18 17 16 15   Temp:      TempSrc:      SpO2: 96% 98% 94% 96%  Weight:      Height:        2+ femoral pulses no hematoma 2+ DP right 1 + PT left No abdominal pain soft non tender.  A: Delirium undergoing eval by CCM P: seems ok from vascular standpoint.  Will let Dr Trula Slade know about pt admission. He is more familiar with his baseline.  Ruta Hinds, MD [facility] Office: [phone]

## 2019-06-06 NOTE — Progress Notes (Signed)
eLink Physician-Brief Progress Note Patient Name: Derek Harrell DOB: Mar 28, 1946 MRN: [id number]   Date of Service  06/06/2019  HPI/Events of Note  Patient on multiple PO meds. NPO unable to swallow d/t severe Parkinson's disease.   eICU Interventions  Will order: 1. Place NGT to to LIS.  2. Given medications per tube.      Intervention Category Major Interventions: Other:  Lysle Dingwall 06/06/2019, 10:55 PM

## 2019-06-07 ENCOUNTER — Inpatient Hospital Stay (HOSPITAL_COMMUNITY): Payer: Medicare Other

## 2019-06-07 DIAGNOSIS — G9340 Encephalopathy, unspecified: Secondary | ICD-10-CM

## 2019-06-07 DIAGNOSIS — Z9911 Dependence on respirator [ventilator] status: Secondary | ICD-10-CM

## 2019-06-07 DIAGNOSIS — L8990 Pressure ulcer of unspecified site, unspecified stage: Secondary | ICD-10-CM | POA: Insufficient documentation

## 2019-06-07 DIAGNOSIS — J9601 Acute respiratory failure with hypoxia: Secondary | ICD-10-CM

## 2019-06-07 DIAGNOSIS — R092 Respiratory arrest: Secondary | ICD-10-CM

## 2019-06-07 LAB — POCT I-STAT 7, (LYTES, BLD GAS, ICA,H+H)
Acid-Base Excess: 1 mmol/L (ref 0.0–2.0)
Bicarbonate: 24.7 mmol/L (ref 20.0–28.0)
Calcium, Ion: 1.14 mmol/L — ABNORMAL LOW (ref 1.15–1.40)
HCT: 36 % — ABNORMAL LOW (ref 39.0–52.0)
Hemoglobin: 12.2 g/dL — ABNORMAL LOW (ref 13.0–17.0)
O2 Saturation: 100 %
Patient temperature: 97.7
Potassium: 3.7 mmol/L (ref 3.5–5.1)
Sodium: 142 mmol/L (ref 135–145)
TCO2: 26 mmol/L (ref 22–32)
pCO2 arterial: 35.3 mmHg (ref 32.0–48.0)
pH, Arterial: 7.451 — ABNORMAL HIGH (ref 7.350–7.450)
pO2, Arterial: 218 mmHg — ABNORMAL HIGH (ref 83.0–108.0)

## 2019-06-07 LAB — CBC
HCT: 40.4 % (ref 39.0–52.0)
Hemoglobin: 13.1 g/dL (ref 13.0–17.0)
MCH: 27 pg (ref 26.0–34.0)
MCHC: 32.4 g/dL (ref 30.0–36.0)
MCV: 83.3 fL (ref 80.0–100.0)
Platelets: 206 10*3/uL (ref 150–400)
RBC: 4.85 MIL/uL (ref 4.22–5.81)
RDW: 13.3 % (ref 11.5–15.5)
WBC: 10.4 10*3/uL (ref 4.0–10.5)
nRBC: 0 % (ref 0.0–0.2)

## 2019-06-07 LAB — GLUCOSE, CAPILLARY
Glucose-Capillary: 105 mg/dL — ABNORMAL HIGH (ref 70–99)
Glucose-Capillary: 113 mg/dL — ABNORMAL HIGH (ref 70–99)
Glucose-Capillary: 127 mg/dL — ABNORMAL HIGH (ref 70–99)
Glucose-Capillary: 133 mg/dL — ABNORMAL HIGH (ref 70–99)
Glucose-Capillary: 139 mg/dL — ABNORMAL HIGH (ref 70–99)
Glucose-Capillary: 142 mg/dL — ABNORMAL HIGH (ref 70–99)

## 2019-06-07 LAB — BASIC METABOLIC PANEL
Anion gap: 15 (ref 5–15)
BUN: 20 mg/dL (ref 8–23)
CO2: 23 mmol/L (ref 22–32)
Calcium: 8.6 mg/dL — ABNORMAL LOW (ref 8.9–10.3)
Chloride: 104 mmol/L (ref 98–111)
Creatinine, Ser: 0.93 mg/dL (ref 0.61–1.24)
GFR calc Af Amer: >60 mL/min (ref >60)
GFR calc non Af Amer: >60 mL/min (ref >60)
Glucose, Bld: 152 mg/dL — ABNORMAL HIGH (ref 70–99)
Potassium: 3.7 mmol/L (ref 3.5–5.1)
Sodium: 142 mmol/L (ref 135–145)

## 2019-06-07 LAB — LACTIC ACID, PLASMA: Lactic Acid, Venous: 1.8 mmol/L (ref 0.5–1.9)

## 2019-06-07 LAB — TRIGLYCERIDES: Triglycerides: 77 mg/dL (ref <150)

## 2019-06-07 LAB — PHOSPHORUS
Phosphorus: 5.9 mg/dL — ABNORMAL HIGH (ref 2.5–4.6)
Phosphorus: 3.3 mg/dL (ref 2.5–4.6)
Phosphorus: 2.5 mg/dL (ref 2.5–4.6)

## 2019-06-07 LAB — MAGNESIUM
Magnesium: 2.4 mg/dL (ref 1.7–2.4)
Magnesium: 2.3 mg/dL (ref 1.7–2.4)
Magnesium: 2.4 mg/dL (ref 1.7–2.4)

## 2019-06-07 MED ORDER — CLOPIDOGREL BISULFATE 75 MG PO TABS
75.0000 mg | ORAL_TABLET | Freq: Every day | ORAL | Status: DC
  Administered 2019-06-07 – 2019-06-25 (×19): 75 mg
  Filled 2019-06-07 (×19): qty 1

## 2019-06-07 MED ORDER — PHENYLEPHRINE HCL-NACL 10-0.9 MG/250ML-% IV SOLN
INTRAVENOUS | Status: AC
  Filled 2019-06-07: qty 250

## 2019-06-07 MED ORDER — DEXMEDETOMIDINE HCL IN NACL 400 MCG/100ML IV SOLN
0.4000 ug/kg/h | INTRAVENOUS | Status: DC
  Administered 2019-06-07: 1 ug/kg/h via INTRAVENOUS
  Administered 2019-06-07 – 2019-06-08 (×2): 0.4 ug/kg/h via INTRAVENOUS
  Administered 2019-06-08: 0.6 ug/kg/h via INTRAVENOUS
  Filled 2019-06-07 (×3): qty 100

## 2019-06-07 MED ORDER — PROPOFOL 1000 MG/100ML IV EMUL: 0.0000 ug/kg/min | INTRAVENOUS | Status: DC

## 2019-06-07 MED ORDER — ORAL CARE MOUTH RINSE
15.0000 mL | OROMUCOSAL | Status: DC
  Administered 2019-06-07 – 2019-06-08 (×11): 15 mL via OROMUCOSAL

## 2019-06-07 MED ORDER — VITAL AF 1.2 CAL PO LIQD
1000.0000 mL | ORAL | Status: DC
Start: 2019-06-07 — End: 2019-06-08
  Administered 2019-06-07 – 2019-06-08 (×2): 1000 mL

## 2019-06-07 MED ORDER — ROCURONIUM BROMIDE 50 MG/5ML IV SOLN
70.0000 mg | Freq: Once | INTRAVENOUS | Status: AC
  Administered 2019-06-07: 70 mg via INTRAVENOUS

## 2019-06-07 MED ORDER — PRO-STAT SUGAR FREE PO LIQD
30.0000 mL | Freq: Two times a day (BID) | ORAL | Status: DC
Start: 2019-06-07 — End: 2019-06-07

## 2019-06-07 MED ORDER — INSULIN ASPART 100 UNIT/ML ~~LOC~~ SOLN
1.0000 [IU] | SUBCUTANEOUS | Status: DC
  Administered 2019-06-07 – 2019-06-09 (×7): 1 [IU] via SUBCUTANEOUS
  Administered 2019-06-10: 2 [IU] via SUBCUTANEOUS
  Administered 2019-06-10 – 2019-06-13 (×10): 1 [IU] via SUBCUTANEOUS

## 2019-06-07 MED ORDER — METOPROLOL TARTRATE 5 MG/5ML IV SOLN
INTRAVENOUS | Status: AC
  Administered 2019-06-07: 02:00:00 5 mg
  Filled 2019-06-07: qty 5

## 2019-06-07 MED ORDER — PROPOFOL 1000 MG/100ML IV EMUL
INTRAVENOUS | Status: AC
  Filled 2019-06-07: qty 100

## 2019-06-07 MED ORDER — HYDRALAZINE HCL 25 MG PO TABS
25.0000 mg | ORAL_TABLET | Freq: Three times a day (TID) | ORAL | Status: DC
  Administered 2019-06-07 – 2019-06-10 (×8): 25 mg
  Filled 2019-06-07 (×8): qty 1

## 2019-06-07 MED ORDER — CARBIDOPA-LEVODOPA ER 48.75-195 MG PO CPCR
1.0000 | ORAL_CAPSULE | Freq: Every day | ORAL | Status: DC
  Administered 2019-06-07 – 2019-07-04 (×132): 1
  Administered 2019-07-04 (×2)
  Administered 2019-07-05 – 2019-07-15 (×51): 1
  Administered 2019-07-15: 18:00:00
  Administered 2019-07-15 (×2): 1
  Administered 2019-07-15: 14:00:00
  Administered 2019-07-16 – 2019-07-25 (×46): 1
  Filled 2019-06-07 (×59): qty 1

## 2019-06-07 MED ORDER — NEBIVOLOL HCL 10 MG PO TABS
10.0000 mg | ORAL_TABLET | Freq: Every day | ORAL | Status: DC
  Administered 2019-06-07 – 2019-06-09 (×3): 10 mg
  Filled 2019-06-07 (×4): qty 1

## 2019-06-07 MED ORDER — FAMOTIDINE 40 MG/5ML PO SUSR
20.0000 mg | Freq: Every day | ORAL | Status: DC
  Administered 2019-06-07: 20 mg
  Filled 2019-06-07: qty 2.5

## 2019-06-07 MED ORDER — CHLORHEXIDINE GLUCONATE CLOTH 2 % EX PADS
6.0000 | MEDICATED_PAD | Freq: Every day | CUTANEOUS | Status: DC
  Administered 2019-06-07 – 2019-06-13 (×7): 6 via TOPICAL

## 2019-06-07 MED ORDER — ETOMIDATE 2 MG/ML IV SOLN
10.0000 mg | Freq: Once | INTRAVENOUS | Status: AC
  Administered 2019-06-07: 10 mg via INTRAVENOUS

## 2019-06-07 MED ORDER — CLOPIDOGREL BISULFATE 75 MG PO TABS: 75.0000 mg | ORAL_TABLET | Freq: Every day | ORAL | Status: DC

## 2019-06-07 MED ORDER — CHLORHEXIDINE GLUCONATE 0.12% ORAL RINSE (MEDLINE KIT)
15.0000 mL | Freq: Two times a day (BID) | OROMUCOSAL | Status: DC
  Administered 2019-06-07 – 2019-06-08 (×3): 15 mL via OROMUCOSAL

## 2019-06-07 MED ORDER — ASPIRIN 81 MG PO CHEW
81.0000 mg | CHEWABLE_TABLET | Freq: Every day | ORAL | Status: DC
  Administered 2019-06-07 – 2019-06-25 (×19): 81 mg
  Filled 2019-06-07 (×19): qty 1

## 2019-06-07 MED ORDER — PRO-STAT SUGAR FREE PO LIQD
30.0000 mL | Freq: Every day | ORAL | Status: DC
  Administered 2019-06-07 – 2019-06-08 (×2): 30 mL
  Filled 2019-06-07 (×2): qty 30

## 2019-06-07 MED ORDER — SODIUM CHLORIDE 0.9 % IV BOLUS
1000.0000 mL | Freq: Once | INTRAVENOUS | Status: AC
  Administered 2019-06-07: 1000 mL via INTRAVENOUS

## 2019-06-07 MED ORDER — FENTANYL CITRATE (PF) 100 MCG/2ML IJ SOLN
INTRAMUSCULAR | Status: AC
  Administered 2019-06-07: 02:00:00
  Filled 2019-06-07: qty 2

## 2019-06-07 MED ORDER — FENTANYL CITRATE (PF) 100 MCG/2ML IJ SOLN: 50.0000 ug | INTRAMUSCULAR | Status: DC | PRN

## 2019-06-07 MED ORDER — NEBIVOLOL HCL 10 MG PO TABS: 10.0000 mg | ORAL_TABLET | Freq: Every day | ORAL | Status: DC

## 2019-06-07 MED ORDER — PYRIDOSTIGMINE BROMIDE 60 MG PO TABS
60.0000 mg | ORAL_TABLET | Freq: Two times a day (BID) | ORAL | Status: DC
  Administered 2019-06-07 – 2019-06-09 (×5): 60 mg
  Filled 2019-06-07 (×7): qty 1

## 2019-06-07 MED ORDER — PHENYLEPHRINE HCL-NACL 10-0.9 MG/250ML-% IV SOLN: 0.0000 ug/min | INTRAVENOUS | Status: DC

## 2019-06-07 MED ORDER — ROSUVASTATIN CALCIUM 5 MG PO TABS
10.0000 mg | ORAL_TABLET | Freq: Every day | ORAL | Status: DC
  Administered 2019-06-07 – 2019-07-24 (×48): 10 mg
  Filled 2019-06-07 (×47): qty 2

## 2019-06-07 MED ORDER — FENTANYL CITRATE (PF) 100 MCG/2ML IJ SOLN
50.0000 ug | INTRAMUSCULAR | Status: DC | PRN
  Administered 2019-06-07: 50 ug via INTRAVENOUS
  Administered 2019-06-08: 06:00:00 100 ug via INTRAVENOUS
  Administered 2019-06-08: 50 ug via INTRAVENOUS
  Filled 2019-06-07 (×3): qty 2

## 2019-06-07 MED ORDER — VITAL HIGH PROTEIN PO LIQD: 1000.0000 mL | ORAL | Status: DC

## 2019-06-07 MED ORDER — DOCUSATE SODIUM 50 MG/5ML PO LIQD: 100.0000 mg | Freq: Two times a day (BID) | ORAL | Status: DC | PRN

## 2019-06-07 MED ORDER — BISACODYL 10 MG RE SUPP: 10.0000 mg | Freq: Every day | RECTAL | Status: DC | PRN

## 2019-06-07 NOTE — Procedures (Signed)
Intubation Procedure Note Derek Harrell [id number] Aug 27, 1945  Procedure: Intubation Indications: hypoxia  Procedure Details Consent: Unable to obtain consent because of emergent medical necessity. Time Out: Verified patient identification, verified procedure, site/side was marked, verified correct patient position, special equipment/implants available, medications/allergies/relevent history reviewed, required imaging and test results available.  Performed  Maximum sterile technique was used including gloves, hand hygiene and mask.  MAC and 3  Grade 3 view, first attempt, CO2 detector to confirm placement etominadate 52m iv Rocuronium 742mIV Fentanyl 10076m Evaluation Hemodynamic Status: BP stable throughout; O2 sats: hypoxic prior to intubation, resolved with intubation Patient's Current Condition: stable Complications: No apparent complications Patient did tolerate procedure well. Chest X-ray ordered to verify placement.  CXR: pending.   LauJulian Hy/10/2018

## 2019-06-07 NOTE — Progress Notes (Signed)
Initial Nutrition Assessment  DOCUMENTATION CODES:   Not applicable  INTERVENTION:    Vital AF 1.2 at 60 ml/h (1440 ml per [place])   Pro-stat 30 ml once daily   Provides 1828 kcal (2060 kcal total with kcal from Propofol), 123 gm protein, 1168 ml free water daily  NUTRITION DIAGNOSIS:   Inadequate oral intake related to inability to eat as evidenced by NPO status.  GOAL:   Patient will meet greater than or equal to 90% of their needs  MONITOR:   Vent status, Labs, TF tolerance, Skin  REASON FOR ASSESSMENT:   Ventilator, Consult Enteral/tube feeding initiation and management  ASSESSMENT:   73 yo male admitted with worsening weakness, MS change, HTN, fever. PMH includes HTN, AAA, HLD, CVD, obesity, stroke, myasthenia gravis, RLS, Parkinson's disease, dementia.  Received MD Consult for TF initiation and management. OG tube in place. Cortrak ordered.  Patient is currently intubated on ventilator support MV: 11.2 L/min Temp (24hrs), Avg:98.6 F (37 C), Min:97.7 F (36.5 C), Max:100.4 F (38 C)  Propofol: 8.8 ml/hr providing 232 kcal from lipid.  Cleviprex is currently off.  Labs reviewed. Phosphorus 5.9 CBG's: 142-105  Medications reviewed and include novolog, precedex, cleviprex, phenylephrine, propofol.  Weight stable over the past 6 months. Usual weights show a 1% weight loss in 6 months, not a significant amount.  Patient with mild-moderate-severe muscle depletion, which is most likely related to Parkinson's disease.  NUTRITION - FOCUSED PHYSICAL EXAM:    Most Recent Value  Orbital Region  No depletion  Upper Arm Region  No depletion  Thoracic and Lumbar Region  Unable to assess  Buccal Region  Unable to assess  Temple Region  Mild depletion  Clavicle Bone Region  Severe depletion  Clavicle and Acromion Bone Region  Mild depletion  Scapular Bone Region  Unable to assess  Dorsal Hand  Unable to assess  Patellar Region  Moderate depletion  Anterior  Thigh Region  Moderate depletion  Posterior Calf Region  Moderate depletion  Edema (RD Assessment)  Mild  Hair  Reviewed  Eyes  Unable to assess  Mouth  Unable to assess  Skin  Reviewed  Nails  Reviewed       Diet Order:   Diet Order            Diet NPO time specified  Diet effective now              EDUCATION NEEDS:   No education needs have been identified at this time  Skin:  Skin Assessment: Skin Integrity Issues: Skin Integrity Issues:: Stage II, Incisions Stage II: buttocks Incisions: groin  Last BM:  PTA  Height:   Ht Readings from Last 1 Encounters:  06/06/19 6' (1.829 m)    Weight:   Wt Readings from Last 1 Encounters:  06/07/19 82.4 kg    Ideal Body Weight:  80.9 kg  BMI:  Body mass index is 24.64 kg/m.  Estimated Nutritional Needs:   Kcal:  2040  Protein:  115-140 gm  Fluid:  >/= 2 L    Molli Barrows, RD, LDN, [place] Pager [phone] After Hours Pager [phone]

## 2019-06-07 NOTE — Progress Notes (Signed)
Son Dervon Ebersole. updated on patient condition.

## 2019-06-07 NOTE — Progress Notes (Signed)
RN noted patient to have an increase in oxygen demand despite being placed on CPAP and appeared to have agonal breathing. RN began to bag patient however, oxygen saturation began to quickly drop in 20's, became bradycardic and ultimately a code was called. Pulse was never lost. Chest compressions were not performed nor ACLS drugs given. Patient intubated by CCM MD.

## 2019-06-07 NOTE — Progress Notes (Signed)
CSW received consult for patient for placement, patient is not currently stable enough for traditional SNF placement at this time. Patient is currently on the ventilator and has not been evaluated by PT. CSW will continue monitoring chart for progress and will assess for placement when appropriate.  Madilyn Fireman, MSW, LCSW-A Transitions of Care  Clinical Social Worker  [facility] Emergency Departments  Medical ICU [phone]

## 2019-06-07 NOTE — Discharge Summary (Signed)
[facility] Discharge Summary   Patient ID:  Derek Harrell MRN: [id number] DOB/AGE: 71-Aug-1947 71 y.o.  Admit date: 06/02/2019 Discharge date: 06/03/19 Date of Surgery: 06/02/2019 Surgeon: Surgeon(s): Serafina Mitchell, MD  Admission Diagnosis: AAA (abdominal aortic aneurysm) [facility] [I71.4]  Discharge Diagnoses:  AAA (abdominal aortic aneurysm) ([place]) [I71.4]  Secondary Diagnoses: Past Medical History:  Diagnosis Date  . Abdominal aortic aneurysm ([place])   . Abnormality of gait 04/12/2013  . Cerebrovascular disease   . Constipation   . Dizziness   . Gait disorder   . Hyperlipidemia   . Hypertension   . 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 Myasthenia gravis ([place]) 01/07/2016  . Obesity   . Parkinson disease ([place])   . Pseudobulbar affect   . RLS (restless legs syndrome) 06/09/2016  . Sleep apnea    "I tried CPAP; didn't work for me" (08/22/2013)  . Stroke [facility]) ?2009   Left pontine stroke; denies residual on 08/22/2013    Procedure(s): ABDOMINAL AORTIC ENDOVASCULAR STENT GRAFT  Discharged Condition: stable  HPI: 73 y/o male seen in consult in the [facility] ER by Dr. Trula Slade on 05/21/2019 for asymptomatic AAA measuring 5.6 cm.  This was discovered after his Urologist ordered a CT for hematuria.  He has been followed by Dr. Gwenlyn Found.  His last AAA ultrasound on 10/15/18 showed largest aortic measurement is 4.8 cm. The largest aortic diameter remains essentially  unchanged compared to prior exam.    The patient takes a statin for hypercholesterolemia..  The patient does have a history of Parkinson's..  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  He also has a history of myasthenia gravis which has been well controlled.  He is medically managed for hypertension.   Hospital Course:  Derek Harrell is a 73 y.o. male is S/P  Procedure(s): ABDOMINAL AORTIC ENDOVASCULAR STENT GRAFT Post op  he was stable and at baseline with patent arterial blood flow to B LE. He was discharged home and scheduled a follow up CTA Abd/pelvis in 1 month to see Dr. Etta Quill.    Significant Diagnostic Studies: CBC Lab Results  Component Value Date   WBC 10.4 06/07/2019   HGB 12.2 (L) 06/07/2019   HCT 36.0 (L) 06/07/2019   MCV 83.3 06/07/2019   PLT 206 06/07/2019    BMET    Component Value Date/Time   NA 142 06/07/2019 0250   NA 142 01/07/2016 0950   K 3.7 06/07/2019 0250   CL 104 06/07/2019 0247   CO2 23 06/07/2019 0247   GLUCOSE 152 (H) 06/07/2019 0247   BUN 20 06/07/2019 0247   BUN 9 01/07/2016 0950   CREATININE 0.93 06/07/2019 0247   CREATININE 0.82 10/26/2017 0918   CALCIUM 8.6 (L) 06/07/2019 0247   GFRNONAA >60 06/07/2019 0247   GFRAA >60 06/07/2019 0247   COAG Lab Results  Component Value Date   INR 1.3 (H) 06/02/2019   INR 1.2 05/26/2019   INR 1.4 (H) 05/20/2019     Disposition:  Discharge to :Home Discharge Instructions    Call MD for:  redness, tenderness, or signs of infection (pain, swelling, bleeding, redness, odor or green/yellow discharge around incision site)   Complete by: As directed    Call MD for:  severe or increased pain, loss or decreased feeling  in affected limb(s)   Complete by: As directed    Call MD  for:  temperature >100.5   Complete by: As directed    Resume previous diet   Complete by: As directed      Allergies as of 06/03/2019   No Known Allergies     Medication List    TAKE these medications   aspirin 81 MG EC tablet Take 1 tablet (81 mg total) by mouth daily. Swallow whole.   clopidogrel 75 MG tablet Commonly known as: PLAVIX Take 75 mg by mouth daily.   ipratropium 0.06 % nasal spray Commonly known as: ATROVENT PLACE 2 SPRAYS IN EACH NOSTRIL FOUR TIMES DAILY FOR NASAL CONGESTION What changed: See the new instructions.   mirabegron ER 50 MG Tb24 tablet Commonly known as: Myrbetriq Take 1 tablet (50 mg total) by mouth  daily.   nebivolol 10 MG tablet Commonly known as: BYSTOLIC Take 10 mg by mouth daily.   Nuplazid 34 MG Caps Generic drug: Pimavanserin Tartrate TAKE 1 CAPSULE BY MOUTH ONCE DAILY What changed: how much to take   oxyCODONE 5 MG immediate release tablet Commonly known as: Oxy IR/ROXICODONE Take 1 tablet (5 mg total) by mouth every 6 (six) hours as needed for moderate pain.   pyridostigmine 60 MG tablet Commonly known as: MESTINON TAKE 1 TABLET BY MOUTH AT 8 AM, 1 TABLET AT 2 PM, AND 1 TABLET AT 8 PM What changed:   how much to take  how to take this  when to take this  additional instructions   rosuvastatin 10 MG tablet Commonly known as: CRESTOR TAKE 1 TABLET(10 MG) BY MOUTH DAILY What changed: See the new instructions.   Rytary 48.75-195 MG Cpcr Generic drug: Carbidopa-Levodopa ER Take 1 tablet by mouth 5 (five) times daily. What changed: additional instructions   Rytary 48.75-195 MG Cpcr Generic drug: Carbidopa-Levodopa ER Take 1 capsule by mouth 5 (five) times daily. What changed: Another medication with the same name was changed. Make sure you understand how and when to take each.   vitamin B-12 1000 MCG tablet Commonly known as: CYANOCOBALAMIN Take 1,000 mcg by mouth daily.      Verbal and written Discharge instructions given to the patient. Wound care per Discharge AVS Follow-up Information    Serafina Mitchell, MD Follow up in 4 week(s).   Specialties: Vascular Surgery, Cardiology Why: office will call Contact information: [place] [address] [phone]           Signed: Roxy Horseman 06/07/2019, 12:27 PM - For VQI Registry use --- Instructions: Press F2 to tab through selections.  Delete question if not applicable.   Post-op:  Time to Extubation: [ ]  In OR, [ ]  < 12 hrs, [ ]  12-24 hrs, [ ]  >=24 hrs Vasopressors Req. Post-op: No MI: [ ]  No, [ ]  Troponin only, [ ]  EKG or Clinical New Arrhythmia: No CHF: No ICU  Stay: 0 days Transfusion: No  If yes, 0 units given  Complications: Resp failure: [x ] none, [ ]  Pneumonia, [ ]  Ventilator Chg in renal function: [x ] none, [ ]  Inc. Cr > 0.5, [ ]  Temp. Dialysis, [ ]  Permanent dialysis Leg ischemia: [x ] No, [ ]  Yes, no Surgery needed, [ ]  Yes, Surgery needed, [ ]  Amputation Bowel ischemia: [x ] No, [ ]  Medical Rx, [ ]  Surgical Rx Wound complication: [x ] No, [ ]  Superficial separation/infection, [ ]  Return to OR Return to OR: No  Return to OR for bleeding: No Stroke: [x ] None, [ ]  Minor, [ ]   Major  Discharge medications: Statin use:  Yes ASA use:  Yes Plavix use:  Yes Beta blocker use:  Yes

## 2019-06-07 NOTE — Procedures (Signed)
Cortrak  Person Inserting Tube:  Rosezetta Schlatter, RD Tube Type:  Cortrak - 43 inches Tube Location:  Right nare Initial Placement:  Stomach Secured by: Bridle Technique Used to Measure Tube Placement:  Documented cm marking at nare/ corner of mouth Cortrak Secured At:  69 cm Procedure Comments:  Cortrak Tube Team Note:  Consult received to place a Cortrak feeding tube.   No x-ray is required. RN may begin using tube.   If the tube becomes dislodged please keep the tube and contact the Cortrak team at [email or web address] ([email or web address]) for replacement.  If after hours and replacement cannot be delayed, place a NG tube and confirm placement with an abdominal x-ray.      Jarome Matin, MS, RD, LDN, [facility] Inpatient Clinical Dietitian Pager # [phone] After hours/weekend pager # [phone]

## 2019-06-07 NOTE — Code Documentation (Signed)
Code blue called for respiratory arrest. Never lost pulse.  Third degree HB with bradycardia, agonal respirations, encephalopathy. Mask was removed by his nurse to suction his mouth, which did not improve his symptoms. BVM with saturations as low as 30%, increased to low 80s where he plateaued. Thick mucus suctioned from pharynx at intubation. Saturations improved to 100% with intubation. Sinus tachycardia with PACs, rate ~130 following intubation which responded minimally to fentanyl. Metoprolol 5mg  IV given.  BP stable throughout, but cleviprex titrated off.   CXR without lobar collapse or significant opacity.   Left message for son Arl, Vandorn to please call the unit for an update.  This patient is critically ill with multiple organ system failure which requires frequent high complexity decision making, assessment, support, evaluation, and titration of therapies. This was completed through the application of advanced monitoring technologies and extensive interpretation of multiple databases. During this encounter critical care time was devoted to patient care services described in this note for 25 minutes.   Julian Hy, DO 06/07/19 2:36 AM [facility]

## 2019-06-07 NOTE — H&P (Signed)
NAME:  Derek Harrell, MRN:  [id number], DOB:  November 01, 1945, LOS: 1 ADMISSION DATE:  06/06/2019, CONSULTATION DATE:  11/2 REFERRING MD:  Johnney Killian, CHIEF COMPLAINT:  Hypertensive crisis and fever    Brief History   73 year old male w/ h/o parkinson's dementia. admitted 11/2 3 days s/p endovascular infrarenal aortic aneurysm repair w/ worsening weakness, MS change, HTN and fever.   History of present illness   73 year old male who was just discharged from hospital 3 days PTA s/p endovascular infrarenal aortic aneurysm repair by [facility]. Has h/o parkinson's disease and what sounds like some degree of dementia. Since discharge to home had worsening confusion (baseline  2 yrs of intermittent confusion and hallucinations at home, baseline inc of urine wears depends), increased lethargy, poor PO intake, more difficulty w/ mobility (at baseline: has required cane when ambulates for > 2 yrs, poor gait about that long), and on [place] of presentation spiked fever of 101.7.  ->In ER: temp 100.4, hypertensive w/ initial BP 170/108 as high as SBPs over 200.  BUN/cr nml, nml LA, mild leukocytosis w/ WBC 11.7. was bradycardic in ER Started on cleviprex gtt. PCCM asked to admit.   Past Medical History  AAA, prior CVA, HTN, HL, MG, RLS, PD, OSA (did not tolerate CPAP), gait disorder   Significant Hospital Events   11/2-  admitted to ICU 11/3 -  Bradycardic arrest from mucus plugging.   Consults:  Vascular surgery called; recommended treating HTN and medical issues. No imaging at this point   Procedures:  11/3 - Intubation (DL MAC 3)  Significant Diagnostic Tests:  CT head 11/2 - Stable white matter disease.  Micro Data:  11/2 - Blood cultures NGTD 11/2 - SARS Cov2 negative.  Antimicrobials:  none  Interim history/subjective:  Confused but not in distress.   Objective   Blood pressure (!) 148/95, pulse 69, temperature 98.2 F (36.8 C), temperature source Axillary, resp. rate 18, height 6' (1.829 m),  weight 82.4 kg, SpO2 100 %.    Vent Mode: PRVC FiO2 (%):  [40 %-100 %] 40 % Set Rate:  [18 bmp] 18 bmp Vt Set:  [620 mL] 620 mL PEEP:  [5 cmH20] 5 cmH20 Plateau Pressure:  [15 cmH20-17 cmH20] 15 cmH20   Intake/Output Summary (Last 24 hours) at 06/07/2019 0750 Last data filed at 06/07/2019 0600 Gross per 24 hour  Intake 1175.78 ml  Output 650 ml  Net 525.78 ml   Filed Weights   06/06/19 1137 06/06/19 1900 06/07/19 0434  Weight: 82.1 kg 82.6 kg 82.4 kg    Examination: General: 73 year old aam resting in bed. Confused but not in acute distress  HENT: NCAT. Sp garbled. MMM Lungs: crackles both bases no accessory use  Cardiovascular: RRR w/ soft systolic murmur  Abdomen: soft not tender + bowel sounds  Extremities: + pulses no edema. Cool to palp.  Neuro: awake oriented X 2 moves all extremities w/ generalized weakness. LEs worse  GU: inc of urine   Resolved Hospital Problem list    Assessment & Plan:   Critically ill due to acute respiratory failure requiring mechanical ventilation.  - Continue full ventilatory support today - Aggressive pulmonary toilette as poor secretion clearance likely cause of intubation.  Acute post-operative delirium superimposed on underlying Parkinson's dementia.  No acute changes on CT - judicious sedative use to maintain comfort. - Will switch to Precedex as less likely to aggravate delirium than propofol.  Hypertensive urgency  SBP goal <170/DBP goal < 90 -  continue clevaprex gtt  - restart enteral medications, and intensify therapy if needed.   Parkinson's disease w/ progressive debilitation Worsening Parkinson's disease symptoms explain poor secretion clearance. Cont home meds  Will need PT consult Social work consult placed Will need SNF  Fever & mild leukocytosis  Likely aspiration in the context of worsening PD symptoms. - Follow cultures.  Best practice:  Diet: Start tube feeds Pain/Anxiety/Delirium protocol (if indicated):  propofol, prn fentanyl. AVOID ANTIPSYCHOTICS due to Parkinson's VAP protocol (if indicated): NA DVT prophylaxis: LMWH GI prophylaxis: famotidine Glucose control: SSI phase 1 Mobility: BR Code Status: full code  Family Communication: wife informed by Dr Loletta Specter. Disposition: ICU, plan to move to SBT tomorrow  Labs   CBC: Recent Labs  Lab 06/02/19 1159 06/03/19 0227 06/06/19 1147 06/07/19 0247 06/07/19 0250  WBC 8.1 9.0 11.7* 10.4  --   NEUTROABS  --   --  9.2*  --   --   HGB 13.6 11.9* 13.3 13.1 12.2*  HCT 41.3 35.3* 40.3 40.4 36.0*  MCV 82.6 82.1 82.6 83.3  --   PLT 152 136* 187 206  --     Basic Metabolic Panel: Recent Labs  Lab 06/02/19 1159 06/03/19 0227 06/06/19 1147 06/06/19 1348 06/07/19 0247 06/07/19 0250  NA 144 143 139  --  142 142  K 3.8 3.6 5.0  --  3.7 3.7  CL 109 109 105  --  104  --   CO2 _0 --  23  --   GLUCOSE 93 104* 118*  --  152*  --   BUN _1 --  20  --   CREATININE 0.86 0.83 0.87  --  0.93  --   CALCIUM 8.9 8.6* 8.6*  --  8.6*  --   MG 2.0  --   --  2.4 2.4  --   PHOS  --   --   --   --  5.9*  --    GFR: Estimated Creatinine Clearance: 80 mL/min (by C-G formula based on SCr of 0.93 mg/dL). Recent Labs  Lab 06/02/19 1159 06/03/19 0227 06/06/19 1147 06/07/19 0241 06/07/19 0247  WBC 8.1 9.0 11.7*  --  10.4  LATICACIDVEN  --   --  1.5 1.8  --     Liver Function Tests: Recent Labs  Lab 06/06/19 1147  AST 39  ALT 8  ALKPHOS 46  BILITOT 2.1*  PROT 6.6  ALBUMIN 3.2*   No results for input(s): LIPASE, AMYLASE in the last 168 hours. No results for input(s): AMMONIA in the last 168 hours.  ABG    Component Value Date/Time   PHART 7.451 (H) 06/07/2019 0250   PCO2ART 35.3 06/07/2019 0250   PO2ART 218.0 (H) 06/07/2019 0250   HCO3 24.7 06/07/2019 0250   TCO2 26 06/07/2019 0250   O2SAT 100.0 06/07/2019 0250     Coagulation Profile: Recent Labs  Lab 06/02/19 1159  INR 1.3*    Cardiac Enzymes: No results for  input(s): CKTOTAL, CKMB, CKMBINDEX, TROPONINI in the last 168 hours.  HbA1C: Hgb A1c MFr Bld  Date/Time Value Ref Range Status  04/30/2015 02:19 PM 5.4 4.6 - 6.5 % Final    Comment:    Glycemic Control Guidelines for People with Diabetes:Non Diabetic:  <6%Goal of Therapy: <7%Additional Action Suggested:  >8%     CBG: Recent Labs  Lab 06/06/19 1857 06/07/19 0228 06/07/19 0732  GLUCAP 118* 142* 105*    CRITICAL CARE Performed by:  Lakelyn Straus   Total critical care time: 40 minutes  Critical care time was exclusive of separately billable procedures and treating other patients.  Critical care was necessary to treat or prevent imminent or life-threatening deterioration.  Critical care was time spent personally by me on the following activities: development of treatment plan with patient and/or surrogate as well as nursing, discussions with consultants, evaluation of patient's response to treatment, examination of patient, obtaining history from patient or surrogate, ordering and performing treatments and interventions, ordering and review of laboratory studies, ordering and review of radiographic studies, pulse oximetry, re-evaluation of patient's condition and participation in multidisciplinary rounds.  Kipp Brood, MD [facility] ICU Physician [place]  Pager: [phone] Mobile: [phone] After hours: [phone].

## 2019-06-07 NOTE — Progress Notes (Signed)
Pt had NG tube placed at the beginning of shift however, it was removed during code. OG placed after intubation but unclear where the tube was placed. Orders given to remove and replace. Attempted to re-insert tube through both orally and nasally but unsuccessful.

## 2019-06-07 NOTE — Progress Notes (Signed)
Elink notified, patient had episode of bradycardia and increased oxygen demand. Patient suctioned and CPAP reapplied.

## 2019-06-07 NOTE — Progress Notes (Signed)
eLink Physician-Brief Progress Note Patient Name: Derek Harrell DOB: May 30, 1946 MRN: [id number]   Date of Service  06/07/2019  HPI/Events of Note  Hypotension - BP = 65/54 with MAP = 60.   eICU Interventions  Will order: 1. Bolus with 0.9 NaCl 1 liter IV over 1 hour now.  2. Phenylephrine IV infusion. Titrate to MAP >= 65.      Intervention Category Major Interventions: Hypotension - evaluation and management  Sommer,Steven Eugene 06/07/2019, 3:17 AM

## 2019-06-07 NOTE — Progress Notes (Signed)
eLink Physician-Brief Progress Note Patient Name: Derek Harrell DOB: 18-Mar-1946 MRN: [id number]   Date of Service  06/07/2019  HPI/Events of Note  Gastric tube tip likely in distal esophagus. However, it appears to follow the course of the L mainstem bronchus which makes me nervous.   eICU Interventions  Would recommend removing and replacing gastric tube.      Intervention Category Major Interventions: Other:  Lysle Dingwall 06/07/2019, 5:34 AM

## 2019-06-08 DIAGNOSIS — R0600 Dyspnea, unspecified: Secondary | ICD-10-CM | POA: Diagnosis not present

## 2019-06-08 DIAGNOSIS — Z7189 Other specified counseling: Secondary | ICD-10-CM

## 2019-06-08 DIAGNOSIS — R0902 Hypoxemia: Secondary | ICD-10-CM

## 2019-06-08 DIAGNOSIS — G9340 Encephalopathy, unspecified: Secondary | ICD-10-CM

## 2019-06-08 DIAGNOSIS — R4182 Altered mental status, unspecified: Secondary | ICD-10-CM

## 2019-06-08 LAB — GLUCOSE, CAPILLARY
Glucose-Capillary: 102 mg/dL — ABNORMAL HIGH (ref 70–99)
Glucose-Capillary: 119 mg/dL — ABNORMAL HIGH (ref 70–99)
Glucose-Capillary: 122 mg/dL — ABNORMAL HIGH (ref 70–99)
Glucose-Capillary: 126 mg/dL — ABNORMAL HIGH (ref 70–99)
Glucose-Capillary: 141 mg/dL — ABNORMAL HIGH (ref 70–99)
Glucose-Capillary: 98 mg/dL (ref 70–99)

## 2019-06-08 LAB — TRIGLYCERIDES: Triglycerides: 69 mg/dL (ref <150)

## 2019-06-08 LAB — MAGNESIUM: Magnesium: 2.4 mg/dL (ref 1.7–2.4)

## 2019-06-08 LAB — PHOSPHORUS: Phosphorus: 2.2 mg/dL — ABNORMAL LOW (ref 2.5–4.6)

## 2019-06-08 MED ORDER — POTASSIUM PHOSPHATES 15 MMOLE/5ML IV SOLN
30.0000 mmol | Freq: Once | INTRAVENOUS | Status: AC
  Administered 2019-06-08: 30 mmol via INTRAVENOUS
  Filled 2019-06-08: qty 10

## 2019-06-08 MED ORDER — FAMOTIDINE 40 MG/5ML PO SUSR
40.0000 mg | Freq: Every day | ORAL | Status: DC
  Administered 2019-06-08: 40 mg
  Filled 2019-06-08: qty 5

## 2019-06-08 MED ORDER — VITAL AF 1.2 CAL PO LIQD
1000.0000 mL | ORAL | Status: DC
  Administered 2019-06-09: 1000 mL
  Filled 2019-06-08 (×4): qty 1000

## 2019-06-08 NOTE — Progress Notes (Addendum)
    Subjective  -   Extubated today Denies any abdominal pain   Physical Exam:  Palpable femoral pulses Abdomen is soft Nonlabored breathing    Assessment/Plan:    No obvious complication from endovascular aneurysm repair.  Appreciate critical care's assistance.  No need for additional imaging of aneurysm at this time.  Family updated at bedside  Guin 06/08/2019 6:58 PM --  Vitals:   06/08/19 1700 06/08/19 1800  BP: (!) 150/84 (!) 152/78  Pulse: (!) 58 (!) 59  Resp: 17 16  Temp:    SpO2: 100% 100%    Intake/Output Summary (Last 24 hours) at 06/08/2019 1858 Last data filed at 06/08/2019 1800 Gross per 24 hour  Intake 1085.64 ml  Output 1375 ml  Net -289.36 ml     Laboratory CBC    Component Value Date/Time   WBC 10.4 06/07/2019 0247   HGB 12.2 (L) 06/07/2019 0250   HGB 14.8 01/07/2016 0950   HCT 36.0 (L) 06/07/2019 0250   HCT 43.5 01/07/2016 0950   PLT 206 06/07/2019 0247   PLT 179 01/07/2016 0950    BMET    Component Value Date/Time   NA 142 06/07/2019 0250   NA 142 01/07/2016 0950   K 3.7 06/07/2019 0250   CL 104 06/07/2019 0247   CO2 23 06/07/2019 0247   GLUCOSE 152 (H) 06/07/2019 0247   BUN 20 06/07/2019 0247   BUN 9 01/07/2016 0950   CREATININE 0.93 06/07/2019 0247   CREATININE 0.82 10/26/2017 0918   CALCIUM 8.6 (L) 06/07/2019 0247   GFRNONAA >60 06/07/2019 0247   GFRAA >60 06/07/2019 0247    COAG Lab Results  Component Value Date   INR 1.3 (H) 06/02/2019   INR 1.2 05/26/2019   INR 1.4 (H) 05/20/2019   No results found for: PTT  Antibiotics Anti-infectives (From admission, onward)   None       V. Leia Alf, M.D., [facility] [place] Office: [phone] Pager:  [phone]

## 2019-06-08 NOTE — Progress Notes (Signed)
eLink Physician-Brief Progress Note Patient Name: Izais Ganson DOB: [birth year] MRN: [id number]   Date of Service  06/08/2019  HPI/Events of Note  Request to restart enteral nutrition.   eICU Interventions  Will restart Vital AF 1.2 at 60 mL/hour.     Intervention Category Major Interventions: Other:  Lysle Dingwall 06/08/2019, 11:24 PM

## 2019-06-08 NOTE — Procedures (Signed)
Extubation Procedure Note  Patient Details:   Name: Derek Harrell DOB: 1946-02-04 MRN: [id number]   Airway Documentation:    Vent end date: 06/08/19 Vent end time: 1107   Evaluation  O2 sats: stable throughout Complications: No apparent complications Patient did tolerate procedure well. Bilateral Breath Sounds: Clear, Diminished   Yes   Pt extubated to 4L N/C.  No stridor noted.  RN @ bedside.  Donnetta Hail 06/08/2019, 11:08 AM

## 2019-06-08 NOTE — Progress Notes (Signed)
eLink Physician-Brief Progress Note Patient Name: Derek Harrell DOB: 1945-12-02 MRN: [id number]   Date of Service  06/08/2019  HPI/Events of Note  Agitation - Request for bilateral soft wrist restraints.   eICU Interventions  Will order bilateral soft wrist restraints X 2 hours. Rounding team will need to re-evaluate need for continued restraint.      Intervention Category Major Interventions: Delirium, psychosis, severe agitation - evaluation and management  [name] Eugene 06/08/2019, 6:44 AM

## 2019-06-08 NOTE — Progress Notes (Signed)
NAME:  Derek Harrell, MRN:  [id number], DOB:  08-18-1945, LOS: 2 ADMISSION DATE:  06/06/2019, CONSULTATION DATE:  11/2 REFERRING MD:  Johnney Killian, CHIEF COMPLAINT:  Hypertensive crisis and fever    Brief History   73 year old male w/ h/o parkinson's dementia. admitted 11/2 3 days s/p endovascular infrarenal aortic aneurysm repair w/ worsening weakness, MS change, HTN and fever.   History of present illness   73 year old male who was just discharged from hospital 3 days PTA s/p endovascular infrarenal aortic aneurysm repair by [facility]. Has h/o parkinson's disease and what sounds like some degree of dementia. Since discharge to home had worsening confusion (baseline  2 yrs of intermittent confusion and hallucinations at home, baseline inc of urine wears depends), increased lethargy, poor PO intake, more difficulty w/ mobility (at baseline: has required cane when ambulates for > 2 yrs, poor gait about that long), and on [place] of presentation spiked fever of 101.7.  ->In ER: temp 100.4, hypertensive w/ initial BP 170/108 as high as SBPs over 200.  BUN/cr nml, nml LA, mild leukocytosis w/ WBC 11.7. was bradycardic in ER Started on cleviprex gtt. PCCM asked to admit.   Past Medical History  AAA, prior CVA, HTN, HL, MG, RLS, PD, OSA (did not tolerate CPAP), gait disorder   Significant Hospital Events   11/2-  admitted to ICU 11/3 -  Bradycardic arrest from mucus plugging.   Consults:  Vascular surgery called; recommended treating HTN and medical issues. No imaging at this point   Procedures:  11/3 - Intubation (DL MAC 3)  Significant Diagnostic Tests:  CT head 11/2 - Stable white matter disease.  Micro Data:  11/2 - Blood cultures NGTD 11/2 - SARS Cov2 negative.  Antimicrobials:  none  Interim history/subjective:  Tolerating PSV well.  Follows basic commands  Objective   Blood pressure 115/66, pulse 66, temperature 98.2 F (36.8 C), temperature source Oral, resp. rate 18, height 6' (1.829  m), weight 83.9 kg, SpO2 100 %.    Vent Mode: CPAP;PSV FiO2 (%):  [40 %] 40 % Set Rate:  [18 bmp] 18 bmp Vt Set:  [620 mL] 620 mL PEEP:  [5 cmH20] 5 cmH20 Pressure Support:  [5 cmH20] 5 cmH20 Plateau Pressure:  [14 cmH20-17 cmH20] 14 cmH20   Intake/Output Summary (Last 24 hours) at 06/08/2019 0918 Last data filed at 06/08/2019 0600 Gross per 24 hour  Intake 1087.51 ml  Output 1075 ml  Net 12.51 ml   Filed Weights   06/06/19 1900 06/07/19 0434 06/08/19 0420  Weight: 82.6 kg 82.4 kg 83.9 kg    Examination: General: Adult male, resting in bed, in NAD. Neuro: Opens eyes to voice, follows basic commands. HEENT: [place]/AT. Sclerae anicteric. ETT in place. Cardiovascular: RRR, no M/R/G.  Lungs: Respirations even and unlabored.  CTA bilaterally, No W/R/R.  Abdomen: BS x 4, soft, NT/ND.  Musculoskeletal: No gross deformities, no edema.  Skin: Intact, warm, no rashes.  Assessment & Plan:   Acute respiratory failure requiring mechanical ventilation.  - Continue PSV and weaning of sedation - will hopefully be able to extubate later this AM. - Aggressive pulmonary toilette as poor secretion clearance likely cause of intubation. - F/u cultures  Acute post-operative delirium superimposed on underlying Parkinson's dementia.  No acute changes on CT. - Judicious sedative use to maintain comfort. - Continue precedex.  Hypertensive urgency - SBP goal <170/DBP goal < 90.  - Continue enteral medications, and intensify therapy if needed.  - Restart cleviprex  if needed.  Parkinson's disease w/ progressive debilitation - Worsening Parkinson's disease symptoms explain poor secretion clearance. - Cont home meds  - Will need PT consult - Social work consult placed as will need SNF   Best practice:  Diet: TFs Pain/Anxiety/Delirium protocol (if indicated): Precedex.  AVOID ANTIPSYCHOTICS due to Parkinson's VAP protocol (if indicated): in place DVT prophylaxis: LMWH GI prophylaxis: famotidine  Glucose control: SSI phase 1 Mobility: BR Code Status: full code  Family Communication: wife informed by Dr Loletta Specter. Disposition: ICU.  If extubated and tolerates, will watch in ICU overnight given risk of decompensation in setting poor airway clearance.  If stable, can then transfer to [facility] 11/5.   CC time: 35 min.   [name], [place] [facility] 06/08/2019, 9:39 AM

## 2019-06-09 DIAGNOSIS — R0902 Hypoxemia: Secondary | ICD-10-CM | POA: Diagnosis not present

## 2019-06-09 DIAGNOSIS — J9601 Acute respiratory failure with hypoxia: Secondary | ICD-10-CM | POA: Diagnosis not present

## 2019-06-09 DIAGNOSIS — G9340 Encephalopathy, unspecified: Secondary | ICD-10-CM | POA: Diagnosis not present

## 2019-06-09 DIAGNOSIS — I161 Hypertensive emergency: Secondary | ICD-10-CM | POA: Diagnosis not present

## 2019-06-09 LAB — GLUCOSE, CAPILLARY
Glucose-Capillary: 112 mg/dL — ABNORMAL HIGH (ref 70–99)
Glucose-Capillary: 126 mg/dL — ABNORMAL HIGH (ref 70–99)
Glucose-Capillary: 115 mg/dL — ABNORMAL HIGH (ref 70–99)
Glucose-Capillary: 114 mg/dL — ABNORMAL HIGH (ref 70–99)
Glucose-Capillary: 112 mg/dL — ABNORMAL HIGH (ref 70–99)

## 2019-06-09 LAB — BASIC METABOLIC PANEL
Anion gap: 8 (ref 5–15)
BUN: 29 mg/dL — ABNORMAL HIGH (ref 8–23)
CO2: 26 mmol/L (ref 22–32)
Calcium: 8.5 mg/dL — ABNORMAL LOW (ref 8.9–10.3)
Chloride: 111 mmol/L (ref 98–111)
Creatinine, Ser: 0.94 mg/dL (ref 0.61–1.24)
GFR calc Af Amer: >60 mL/min (ref >60)
GFR calc non Af Amer: >60 mL/min (ref >60)
Glucose, Bld: 119 mg/dL — ABNORMAL HIGH (ref 70–99)
Potassium: 3.7 mmol/L (ref 3.5–5.1)
Sodium: 145 mmol/L (ref 135–145)

## 2019-06-09 LAB — CBC
HCT: 37.4 % — ABNORMAL LOW (ref 39.0–52.0)
Hemoglobin: 12.3 g/dL — ABNORMAL LOW (ref 13.0–17.0)
MCH: 27.1 pg (ref 26.0–34.0)
MCHC: 32.9 g/dL (ref 30.0–36.0)
MCV: 82.4 fL (ref 80.0–100.0)
Platelets: 180 10*3/uL (ref 150–400)
RBC: 4.54 MIL/uL (ref 4.22–5.81)
RDW: 13.6 % (ref 11.5–15.5)
WBC: 9 10*3/uL (ref 4.0–10.5)
nRBC: 0 % (ref 0.0–0.2)

## 2019-06-09 LAB — PHOSPHORUS: Phosphorus: 3.8 mg/dL (ref 2.5–4.6)

## 2019-06-09 MED ORDER — AMLODIPINE BESYLATE 5 MG PO TABS
5.0000 mg | ORAL_TABLET | Freq: Every day | ORAL | Status: DC
  Administered 2019-06-09 – 2019-06-10 (×2): 5 mg via ORAL
  Filled 2019-06-09 (×2): qty 1
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
## 2019-06-09 NOTE — Progress Notes (Addendum)
NAME:  Derek Harrell, MRN:  [id number], DOB:  [birth year], LOS: 3 ADMISSION DATE:  06/06/2019, CONSULTATION DATE:  11/2 REFERRING MD:  Johnney Killian, CHIEF COMPLAINT:  Hypertensive crisis and fever    Brief History   73 year old male w/ h/o parkinson's dementia. admitted 11/2 3 days s/p endovascular infrarenal aortic aneurysm repair w/ worsening weakness, MS change, HTN and fever.   History of present illness   73 year old male who was just discharged from hospital 3 days PTA s/p endovascular infrarenal aortic aneurysm repair by [facility]. Has h/o parkinson's disease and what sounds like some degree of dementia. Since discharge to home had worsening confusion (baseline  2 yrs of intermittent confusion and hallucinations at home, baseline inc of urine wears depends), increased lethargy, poor PO intake, more difficulty w/ mobility (at baseline: has required cane when ambulates for > 2 yrs, poor gait about that long), and on [place] of presentation spiked fever of 101.7.  ->In ER: temp 100.4, hypertensive w/ initial BP 170/108 as high as SBPs over 200.  BUN/cr nml, nml LA, mild leukocytosis w/ WBC 11.7. was bradycardic in ER Started on cleviprex gtt. PCCM asked to admit.   Past Medical History  AAA, prior CVA, HTN, HL, MG, RLS, PD, OSA (did not tolerate CPAP), gait disorder   Significant Hospital Events   11/2-  admitted to ICU 11/3 -  Bradycardic arrest from mucus plugging.   Consults:  Vascular surgery called; recommended treating HTN and medical issues. No imaging at this point   Procedures:  ETT 11/3 > 11/4  Significant Diagnostic Tests:  CT head 11/2 - Stable white matter disease.  Micro Data:  11/2 - Blood cultures NGTD 11/2 - SARS Cov2 negative.  Antimicrobials:  none  Interim history/subjective:  Tolerated extubation well.  Undergoing SLP eval now.  Objective   Blood pressure (!) 158/66, pulse 97, temperature 98.7 F (37.1 C), temperature source Oral, resp. rate (!) 21, height 6' (1.829  m), weight 83.1 kg, SpO2 100 %.    FiO2 (%):  [40 %] 40 %   Intake/Output Summary (Last 24 hours) at 06/09/2019 0911 Last data filed at 06/09/2019 0800 Gross per 24 hour  Intake 1746.45 ml  Output 2150 ml  Net -403.55 ml   Filed Weights   06/07/19 0434 06/08/19 0420 06/09/19 0500  Weight: 82.4 kg 83.9 kg 83.1 kg    Examination: General: Adult male, resting in bed, in NAD. Neuro: A&O x 3, no deficits. HEENT: [place]/AT. Sclerae anicteric. Cardiovascular: RRR, no M/R/G.  Lungs: Respirations even and unlabored.  CTA bilaterally, No W/R/R.  Abdomen: BS x 4, soft, NT/ND.  Musculoskeletal: No gross deformities, no edema.  Skin: Intact, warm, no rashes.  Assessment & Plan:   Acute respiratory failure requiring mechanical ventilation - resolved, tolerated extubation well 11/4. - Continue aggressive pulmonary toilette as poor secretion clearance likely cause of intubation.  Hypertensive urgency - SBP goal <170/DBP goal < 90.  - Continue enteral medications, and intensify therapy if needed.   Parkinson's disease w/ progressive debilitation - Worsening Parkinson's disease symptoms explain poor secretion clearance. - Cont home meds  - PT / OT consult - Social work consult placed as will need SNF.  Dysphagia ?  - SLP eval (currently being seen).  Leave in ICU given difficulties swallowing and concern for airway protection.  Best practice:  Diet: TFs Pain/Anxiety/Delirium protocol (if indicated): N/A.  AVOID ANTIPSYCHOTICS due to Parkinson's. VAP protocol (if indicated): N/A. DVT prophylaxis: LMWH GI prophylaxis: N/A.  Glucose control: SSI. Mobility: BR Code Status: full code  Family Communication: wife informed by Dr Loletta Specter. Disposition: ICU.     [name], [place] [facility] 06/09/2019, 9:11 AM  Attending Note:  73 year old male with parkinson dementia who was admitted for AMS, HTN and fever.  No events overnight.  On exam, patient is  arousable with a very weak cough and profound concern for airway protection and coarse BS and transmitted upper airway sounds diffusely.  I reviewed CXR myself, infiltrate noted.  Discussed with PCCM-NP.  Will hold in the ICU given concern for airway protection.  Will consider palliative care involvement given dementia and concern for airway protection.  Remains full code for now.  PCCM will continue to manage.  The patient is critically ill with multiple organ systems failure and requires high complexity decision making for assessment and support, frequent evaluation and titration of therapies, application of advanced monitoring technologies and extensive interpretation of multiple databases.   Critical Care Time devoted to patient care services described in this note is  31  Minutes. This time reflects time of care of this signee Dr Jennet Maduro. This critical care time does not reflect procedure time, or teaching time or supervisory time of PA/NP/Med student/Med Resident etc but could involve care discussion time.  Rush Farmer, M.D. [facility].

## 2019-06-09 NOTE — Progress Notes (Signed)
PCCM Interval Progress Note  Re-evaluated this afternoon and has remained stable.  Discussed with Dr. Nelda Marseille as well.  OK for transfer out of ICU to med-surge.  Will ask TRH to assume care in AM 11/6 with PCCM off at that time.   [name], [place] [facility] 06/09/2019, 5:00 PM

## 2019-06-09 NOTE — Progress Notes (Signed)
Wife Sherlene Shams updated on patient transfer to the floor and provided with unit phone number.

## 2019-06-09 NOTE — Progress Notes (Addendum)
  Progress Note    06/09/2019 8:56 AM    Afebrile x 24 hrs  Vitals:   06/09/19 0700 06/09/19 0800  BP: (!) 158/66   Pulse: 97   Resp: (!) 21   Temp:  98.7 F (37.1 C)  SpO2: 100%     Physical Exam: General:  No distress Lungs:  extubated Incisions:  Bilateral groins are soft Extremities:  Bilateral feet are warm Abdomen:  Soft non tender  CBC    Component Value Date/Time   WBC 9.0 06/09/2019 0354   RBC 4.54 06/09/2019 0354   HGB 12.3 (L) 06/09/2019 0354   HGB 14.8 01/07/2016 0950   HCT 37.4 (L) 06/09/2019 0354   HCT 43.5 01/07/2016 0950   PLT 180 06/09/2019 0354   PLT 179 01/07/2016 0950   MCV 82.4 06/09/2019 0354   MCV 80 01/07/2016 0950   MCH 27.1 06/09/2019 0354   MCHC 32.9 06/09/2019 0354   RDW 13.6 06/09/2019 0354   RDW 13.8 01/07/2016 0950   LYMPHSABS 1.0 06/06/2019 1147   LYMPHSABS 1.5 01/07/2016 0950   MONOABS 1.2 (H) 06/06/2019 1147   EOSABS 0.3 06/06/2019 1147   EOSABS 0.3 01/07/2016 0950   BASOSABS 0.0 06/06/2019 1147   BASOSABS 0.1 01/07/2016 0950    BMET    Component Value Date/Time   NA 145 06/09/2019 0354   NA 142 01/07/2016 0950   K 3.7 06/09/2019 0354   CL 111 06/09/2019 0354   CO2 26 06/09/2019 0354   GLUCOSE 119 (H) 06/09/2019 0354   BUN 29 (H) 06/09/2019 0354   BUN 9 01/07/2016 0950   CREATININE 0.94 06/09/2019 0354   CREATININE 0.82 10/26/2017 0918   CALCIUM 8.5 (L) 06/09/2019 0354   GFRNONAA >60 06/09/2019 0354   GFRAA >60 06/09/2019 0354    INR    Component Value Date/Time   INR 1.3 (H) 06/02/2019 1159     Intake/Output Summary (Last 24 hours) at 06/09/2019 0856 Last data filed at 06/09/2019 0800 Gross per 24 hour  Intake 1827.48 ml  Output 2150 ml  Net -322.52 ml     Assessment:  73 y.o. male is s/p EVAR 06/02/2019 readmitted with worsening weakness  Plan: -pt remains extubated. -bilateral groins look good and abdomen is soft -still no obvious complications from EVAR    Leontine Locket, PA-C [facility] [phone] 06/09/2019 8:56 AM   I agree with the above.  COntinue current plan.  [name]

## 2019-06-09 NOTE — Evaluation (Signed)
Physical Therapy Evaluation Patient Details Name: Derek Harrell MRN: [id number] DOB: 26-Sep-1945 Today's Date: 06/09/2019   History of Present Illness  73 year old male w/ h/o parkinson's dementia. well controlled myasthenia gravis, CVA and fall in summer 2020. Admitted 11/2  s/p 10/29 endovascular infrarenal aortic aneurysm repair w/ worsening weakness, MS change, HTN and fever. Baseline dementia with confusion and hallucinations. 11/3 bradycardic arrest from mucus plugging requiring intubation 11/3-11/4.  Clinical Impression  Pt unable to provide information on PLOF and home set up. From chart review, pt returned home after AAA surgery without PT consult, mention also of walking with a cane at baseline. Pt is currently limited in safe mobility by decreased cognition, in presence of generalized weakness and decreased balance. Pt requires total Ax2 for bed mobility, and totalAx2 for sit>stand made more difficult by truncal extension with upright. PT recommending SNF level rehab at discharge. PT will continue to follow acutely.      Follow Up Recommendations SNF;Supervision/Assistance - 24 hour    Equipment Recommendations  Other (comment)(TBD at next venue)       Precautions / Restrictions Precautions Precautions: Fall Precaution Comments: hx of falls Restrictions Weight Bearing Restrictions: No      Mobility  Bed Mobility Overal bed mobility: Needs Assistance Bed Mobility: Supine to Sit;Sit to Supine     Supine to sit: Total assist;+2 for physical assistance Sit to supine: Total assist;+2 for physical assistance   General bed mobility comments: total Ax2 to come to EoB, unable to maintain balance without   Transfers Overall transfer level: Needs assistance Equipment used: 2 person hand held assist Transfers: Sit to/from Stand Sit to Stand: Total assist;+2 physical assistance;From elevated surface         General transfer comment: total Ax2 for sit>stand x2 able to achieve  upright with knees blocked however can not maintain due to excessive posterior lean        Balance Overall balance assessment: Needs assistance Sitting-balance support: Feet supported;Bilateral upper extremity supported Sitting balance-Leahy Scale: Poor Sitting balance - Comments: requires outside assist to maintain seated balance   Standing balance support: Bilateral upper extremity supported;During functional activity Standing balance-Leahy Scale: Zero Standing balance comment: unable to maintain upright without maximal outside assist                              Pertinent Vitals/Pain Pain Assessment: Faces Faces Pain Scale: Hurts a little bit Pain Location: generalized weakness with movement Pain Descriptors / Indicators: Moaning;Grimacing Pain Intervention(s): Limited activity within patient's tolerance;Monitored during session;Repositioned    Home Living Family/patient expects to be discharged to:: Skilled nursing facility                      Prior Function           Comments: unable to determine        Extremity/Trunk Assessment   Upper Extremity Assessment Upper Extremity Assessment: Difficult to assess due to impaired cognition(L side weaker than R)    Lower Extremity Assessment Lower Extremity Assessment: Difficult to assess due to impaired cognition(L side weaker than R)       Communication   Communication: Expressive difficulties  Cognition Arousal/Alertness: Awake/alert Behavior During Therapy: WFL for tasks assessed/performed Overall Cognitive Status: Impaired/Different from baseline Area of Impairment: Orientation;Attention;Following commands;Safety/judgement;Awareness;Problem solving;Memory                 Orientation Level: Disoriented to;Situation;Time(able to  state he is [name] but not where) Current Attention Level: Sustained Memory: Decreased short-term memory Following Commands: Follows one step commands  inconsistently;Follows one step commands with increased time Safety/Judgement: Decreased awareness of safety;Decreased awareness of deficits Awareness: Intellectual Problem Solving: Slow processing;Difficulty sequencing;Decreased initiation;Requires verbal cues;Requires tactile cues General Comments: Pt with undeylying dementia, requires increased time for illiciting movement and requires max multimodal cuing for command follow      General Comments General comments (skin integrity, edema, etc.): Pt on 2L O2 via [name] able to maintain SaO2 >93%O2 throughout session, BP 148/80        Assessment/Plan    PT Assessment Patient needs continued PT services  PT Problem List Decreased strength;Decreased range of motion;Decreased activity tolerance;Decreased balance;Decreased mobility;Decreased coordination;Decreased cognition;Decreased knowledge of precautions;Cardiopulmonary status limiting activity;Decreased safety awareness       PT Treatment Interventions DME instruction;Gait training;Functional mobility training;Therapeutic activities;Therapeutic exercise;Balance training;Neuromuscular re-education;Cognitive remediation;Patient/family education    PT Goals (Current goals can be found in the Care Plan section)  Acute Rehab PT Goals Patient Stated Goal: none stated PT Goal Formulation: Patient unable to participate in goal setting Time For Goal Achievement: 06/23/19 Potential to Achieve Goals: Fair    Frequency Min 2X/week    AM-PAC PT "6 Clicks" Mobility  Outcome Measure Help needed turning from your back to your side while in a flat bed without using bedrails?: Total Help needed moving from lying on your back to sitting on the side of a flat bed without using bedrails?: Total Help needed moving to and from a bed to a chair (including a wheelchair)?: Total Help needed standing up from a chair using your arms (e.g., wheelchair or bedside chair)?: Total Help needed to walk in hospital  room?: Total Help needed climbing 3-5 steps with a railing? : Total 6 Click Score: 6    End of Session Equipment Utilized During Treatment: Gait belt;Oxygen Activity Tolerance: Patient tolerated treatment well Patient left: in bed;with call bell/phone within reach;with bed alarm set;with nursing/sitter in room Nurse Communication: Mobility status PT Visit Diagnosis: Unsteadiness on feet (R26.81);Other abnormalities of gait and mobility (R26.89);Muscle weakness (generalized) (M62.81);History of falling (Z91.81);Difficulty in walking, not elsewhere classified (R26.2);Other symptoms and signs involving the nervous system (R29.898);Hemiplegia and hemiparesis Hemiplegia - Right/Left: Left Hemiplegia - caused by: Cerebral infarction    Time: [id number] PT Time Calculation (min) (ACUTE ONLY): 32 min   Charges:   PT Evaluation $PT Eval High Complexity: 1 High PT Treatments $Therapeutic Activity: 8-22 mins        Khady Vandenberg B. Migdalia Dk PT, DPT Acute Rehabilitation Services Pager [phone] Office [phone]   [place] 06/09/2019, 1:14 PM

## 2019-06-10 ENCOUNTER — Inpatient Hospital Stay (HOSPITAL_COMMUNITY): Payer: Medicare Other | Admitting: Registered Nurse

## 2019-06-10 ENCOUNTER — Inpatient Hospital Stay (HOSPITAL_COMMUNITY): Payer: Medicare Other

## 2019-06-10 DIAGNOSIS — F0390 Unspecified dementia without behavioral disturbance: Secondary | ICD-10-CM

## 2019-06-10 DIAGNOSIS — I469 Cardiac arrest, cause unspecified: Secondary | ICD-10-CM | POA: Diagnosis not present

## 2019-06-10 LAB — BASIC METABOLIC PANEL
Anion gap: 12 (ref 5–15)
Anion gap: 11 (ref 5–15)
Anion gap: 9 (ref 5–15)
BUN: 31 mg/dL — ABNORMAL HIGH (ref 8–23)
BUN: 39 mg/dL — ABNORMAL HIGH (ref 8–23)
BUN: 47 mg/dL — ABNORMAL HIGH (ref 8–23)
CO2: 21 mmol/L — ABNORMAL LOW (ref 22–32)
CO2: 21 mmol/L — ABNORMAL LOW (ref 22–32)
CO2: 21 mmol/L — ABNORMAL LOW (ref 22–32)
Calcium: 8.3 mg/dL — ABNORMAL LOW (ref 8.9–10.3)
Calcium: 8.6 mg/dL — ABNORMAL LOW (ref 8.9–10.3)
Calcium: 8 mg/dL — ABNORMAL LOW (ref 8.9–10.3)
Chloride: 113 mmol/L — ABNORMAL HIGH (ref 98–111)
Chloride: 117 mmol/L — ABNORMAL HIGH (ref 98–111)
Chloride: 117 mmol/L — ABNORMAL HIGH (ref 98–111)
Creatinine, Ser: 1.25 mg/dL — ABNORMAL HIGH (ref 0.61–1.24)
Creatinine, Ser: 1.28 mg/dL — ABNORMAL HIGH (ref 0.61–1.24)
Creatinine, Ser: 1.41 mg/dL — ABNORMAL HIGH (ref 0.61–1.24)
GFR calc Af Amer: >60 mL/min (ref >60)
GFR calc Af Amer: >60 mL/min (ref >60)
GFR calc Af Amer: 58 mL/min — ABNORMAL LOW (ref >60)
GFR calc non Af Amer: 58 mL/min — ABNORMAL LOW (ref >60)
GFR calc non Af Amer: 56 mL/min — ABNORMAL LOW (ref >60)
GFR calc non Af Amer: 50 mL/min — ABNORMAL LOW (ref >60)
Glucose, Bld: 197 mg/dL — ABNORMAL HIGH (ref 70–99)
Glucose, Bld: 177 mg/dL — ABNORMAL HIGH (ref 70–99)
Glucose, Bld: 122 mg/dL — ABNORMAL HIGH (ref 70–99)
Potassium: 4.6 mmol/L (ref 3.5–5.1)
Potassium: 4.2 mmol/L (ref 3.5–5.1)
Potassium: 3.9 mmol/L (ref 3.5–5.1)
Sodium: 146 mmol/L — ABNORMAL HIGH (ref 135–145)
Sodium: 149 mmol/L — ABNORMAL HIGH (ref 135–145)
Sodium: 147 mmol/L — ABNORMAL HIGH (ref 135–145)

## 2019-06-10 LAB — GLUCOSE, CAPILLARY
Glucose-Capillary: 121 mg/dL — ABNORMAL HIGH (ref 70–99)
Glucose-Capillary: 122 mg/dL — ABNORMAL HIGH (ref 70–99)
Glucose-Capillary: 134 mg/dL — ABNORMAL HIGH (ref 70–99)
Glucose-Capillary: 145 mg/dL — ABNORMAL HIGH (ref 70–99)
Glucose-Capillary: 160 mg/dL — ABNORMAL HIGH (ref 70–99)
Glucose-Capillary: 218 mg/dL — ABNORMAL HIGH (ref 70–99)

## 2019-06-10 LAB — POCT I-STAT 7, (LYTES, BLD GAS, ICA,H+H)
Acid-base deficit: 1 mmol/L (ref 0.0–2.0)
Acid-base deficit: 1 mmol/L (ref 0.0–2.0)
Bicarbonate: 23.2 mmol/L (ref 20.0–28.0)
Bicarbonate: 22.5 mmol/L (ref 20.0–28.0)
Calcium, Ion: 1.16 mmol/L (ref 1.15–1.40)
Calcium, Ion: 1.15 mmol/L (ref 1.15–1.40)
HCT: 35 % — ABNORMAL LOW (ref 39.0–52.0)
HCT: 34 % — ABNORMAL LOW (ref 39.0–52.0)
Hemoglobin: 11.9 g/dL — ABNORMAL LOW (ref 13.0–17.0)
Hemoglobin: 11.6 g/dL — ABNORMAL LOW (ref 13.0–17.0)
O2 Saturation: 100 %
O2 Saturation: 100 %
Patient temperature: 98.4
Patient temperature: 98.1
Potassium: 3.9 mmol/L (ref 3.5–5.1)
Potassium: 4.1 mmol/L (ref 3.5–5.1)
Sodium: 146 mmol/L — ABNORMAL HIGH (ref 135–145)
Sodium: 147 mmol/L — ABNORMAL HIGH (ref 135–145)
TCO2: 24 mmol/L (ref 22–32)
TCO2: 23 mmol/L (ref 22–32)
pCO2 arterial: 38.1 mmHg (ref 32.0–48.0)
pCO2 arterial: 31.3 mmHg — ABNORMAL LOW (ref 32.0–48.0)
pH, Arterial: 7.393 (ref 7.350–7.450)
pH, Arterial: 7.464 — ABNORMAL HIGH (ref 7.350–7.450)
pO2, Arterial: 512 mmHg — ABNORMAL HIGH (ref 83.0–108.0)
pO2, Arterial: 168 mmHg — ABNORMAL HIGH (ref 83.0–108.0)

## 2019-06-10 LAB — CBC WITH DIFFERENTIAL/PLATELET
Abs Immature Granulocytes: 0.25 10*3/uL — ABNORMAL HIGH (ref 0.00–0.07)
Basophils Absolute: 0.1 10*3/uL (ref 0.0–0.1)
Basophils Relative: 1 %
Eosinophils Absolute: 0.2 10*3/uL (ref 0.0–0.5)
Eosinophils Relative: 1 %
HCT: 38.2 % — ABNORMAL LOW (ref 39.0–52.0)
Hemoglobin: 12.1 g/dL — ABNORMAL LOW (ref 13.0–17.0)
Immature Granulocytes: 2 %
Lymphocytes Relative: 5 %
Lymphs Abs: 0.8 10*3/uL (ref 0.7–4.0)
MCH: 27.2 pg (ref 26.0–34.0)
MCHC: 31.7 g/dL (ref 30.0–36.0)
MCV: 85.8 fL (ref 80.0–100.0)
Monocytes Absolute: 0.8 10*3/uL (ref 0.1–1.0)
Monocytes Relative: 5 %
Neutro Abs: 15 10*3/uL — ABNORMAL HIGH (ref 1.7–7.7)
Neutrophils Relative %: 86 %
Platelets: 170 10*3/uL (ref 150–400)
RBC: 4.45 MIL/uL (ref 4.22–5.81)
RDW: 13.7 % (ref 11.5–15.5)
WBC: 17.1 10*3/uL — ABNORMAL HIGH (ref 4.0–10.5)
nRBC: 0 % (ref 0.0–0.2)

## 2019-06-10 LAB — BRAIN NATRIURETIC PEPTIDE: B Natriuretic Peptide: 249.4 pg/mL — ABNORMAL HIGH (ref 0.0–100.0)

## 2019-06-10 LAB — PHOSPHORUS
Phosphorus: 7.3 mg/dL — ABNORMAL HIGH (ref 2.5–4.6)
Phosphorus: 4.4 mg/dL (ref 2.5–4.6)

## 2019-06-10 LAB — TROPONIN I (HIGH SENSITIVITY)
Troponin I (High Sensitivity): 712 ng/L (ref <18)
Troponin I (High Sensitivity): 912 ng/L (ref <18)
Troponin I (High Sensitivity): 837 ng/L (ref <18)
Troponin I (High Sensitivity): 762 ng/L (ref <18)

## 2019-06-10 LAB — MAGNESIUM: Magnesium: 2.3 mg/dL (ref 1.7–2.4)

## 2019-06-10 LAB — LACTIC ACID, PLASMA
Lactic Acid, Venous: 3.8 mmol/L (ref 0.5–1.9)
Lactic Acid, Venous: 1.8 mmol/L (ref 0.5–1.9)
Lactic Acid, Venous: 1.4 mmol/L (ref 0.5–1.9)

## 2019-06-10 LAB — PROTIME-INR
INR: 1.7 — ABNORMAL HIGH (ref 0.8–1.2)
INR: 1.7 — ABNORMAL HIGH (ref 0.8–1.2)
Prothrombin Time: 19.5 seconds — ABNORMAL HIGH (ref 11.4–15.2)
Prothrombin Time: 19.3 seconds — ABNORMAL HIGH (ref 11.4–15.2)

## 2019-06-10 LAB — APTT: aPTT: 47 seconds — ABNORMAL HIGH (ref 24–36)

## 2019-06-10 MED ORDER — MIDAZOLAM HCL 2 MG/2ML IJ SOLN
1.0000 mg | INTRAMUSCULAR | Status: DC | PRN
  Administered 2019-06-10 (×2): 1 mg via INTRAVENOUS
  Filled 2019-06-10 (×2): qty 2

## 2019-06-10 MED ORDER — ORAL CARE MOUTH RINSE
15.0000 mL | OROMUCOSAL | Status: DC
  Administered 2019-06-10 – 2019-07-25 (×433): 15 mL via OROMUCOSAL

## 2019-06-10 MED ORDER — MIDAZOLAM HCL 2 MG/2ML IJ SOLN
1.0000 mg | INTRAMUSCULAR | Status: AC | PRN
  Administered 2019-06-10 (×3): 1 mg via INTRAVENOUS
  Filled 2019-06-10 (×3): qty 2

## 2019-06-10 MED ORDER — FENTANYL CITRATE (PF) 100 MCG/2ML IJ SOLN
25.0000 ug | INTRAMUSCULAR | Status: AC | PRN
  Administered 2019-06-10 – 2019-06-11 (×3): 25 ug via INTRAVENOUS
  Filled 2019-06-10 (×2): qty 2

## 2019-06-10 MED ORDER — SODIUM CHLORIDE 0.9 % IV SOLN: INTRAVENOUS | Status: DC | PRN

## 2019-06-10 MED ORDER — FENTANYL CITRATE (PF) 100 MCG/2ML IJ SOLN
25.0000 ug | Freq: Once | INTRAMUSCULAR | Status: AC
  Administered 2019-06-10: 25 ug via INTRAVENOUS

## 2019-06-10 MED ORDER — SODIUM CHLORIDE 0.9 % IV SOLN
2.0000 g | Freq: Three times a day (TID) | INTRAVENOUS | Status: DC
  Administered 2019-06-10 – 2019-06-14 (×13): 2 g via INTRAVENOUS
  Filled 2019-06-10 (×17): qty 2

## 2019-06-10 MED ORDER — SODIUM CHLORIDE 0.9 % IV SOLN
INTRAVENOUS | Status: DC
  Administered 2019-06-10 – 2019-06-12 (×5): via INTRAVENOUS

## 2019-06-10 MED ORDER — MIDAZOLAM 50MG/50ML (1MG/ML) PREMIX INFUSION
0.0000 mg/h | INTRAVENOUS | Status: DC
  Administered 2019-06-10: 5 mg/h via INTRAVENOUS
  Administered 2019-06-11: 2 mg/h via INTRAVENOUS
  Filled 2019-06-10 (×2): qty 50

## 2019-06-10 MED ORDER — HYDRALAZINE HCL 20 MG/ML IJ SOLN
10.0000 mg | Freq: Four times a day (QID) | INTRAMUSCULAR | Status: DC | PRN
  Administered 2019-06-18 – 2019-06-19 (×4): 10 mg via INTRAVENOUS
  Filled 2019-06-10 (×2): qty 1
  Filled 2019-06-10: qty 0.5
  Filled 2019-06-10 (×3): qty 1

## 2019-06-10 MED ORDER — AMLODIPINE BESYLATE 10 MG PO TABS
10.0000 mg | ORAL_TABLET | Freq: Every day | ORAL | Status: DC
  Administered 2019-06-11 – 2019-07-14 (×33): 10 mg
  Filled 2019-06-10 (×35): qty 1

## 2019-06-10 MED ORDER — PYRIDOSTIGMINE BROMIDE 60 MG/5ML PO SOLN
60.0000 mg | Freq: Two times a day (BID) | ORAL | Status: DC
  Administered 2019-06-10 – 2019-07-02 (×44): 60 mg
  Filled 2019-06-10 (×49): qty 5

## 2019-06-10 MED ORDER — NOREPINEPHRINE 4 MG/250ML-% IV SOLN: 0.0000 ug/min | INTRAVENOUS | Status: DC

## 2019-06-10 MED ORDER — VANCOMYCIN HCL IN DEXTROSE 1-5 GM/200ML-% IV SOLN
1000.0000 mg | Freq: Two times a day (BID) | INTRAVENOUS | Status: DC
  Administered 2019-06-10 – 2019-06-13 (×6): 1000 mg via INTRAVENOUS
  Filled 2019-06-10 (×7): qty 200

## 2019-06-10 MED ORDER — FENTANYL CITRATE (PF) 100 MCG/2ML IJ SOLN
25.0000 ug | INTRAMUSCULAR | Status: DC | PRN
  Administered 2019-06-10 (×2): 100 ug via INTRAVENOUS
  Administered 2019-06-10: 25 ug via INTRAVENOUS
  Administered 2019-06-10: 100 ug via INTRAVENOUS
  Administered 2019-06-12: 50 ug via INTRAVENOUS
  Administered 2019-06-12: 100 ug via INTRAVENOUS
  Administered 2019-06-12 (×2): 50 ug via INTRAVENOUS
  Filled 2019-06-10 (×4): qty 2

## 2019-06-10 MED ORDER — FENTANYL 2500MCG IN NS 250ML (10MCG/ML) PREMIX INFUSION
25.0000 ug/h | INTRAVENOUS | Status: DC
  Administered 2019-06-10: 50 ug/h via INTRAVENOUS
  Administered 2019-06-12: 07:00:00 25 ug/h via INTRAVENOUS
  Filled 2019-06-10 (×3): qty 250

## 2019-06-10 MED ORDER — MIDAZOLAM BOLUS VIA INFUSION
1.0000 mg | INTRAVENOUS | Status: DC | PRN
  Filled 2019-06-10: qty 2

## 2019-06-10 MED ORDER — SODIUM CHLORIDE 0.9 % IV BOLUS: 500.0000 mL | Freq: Once | INTRAVENOUS | Status: DC

## 2019-06-10 MED ORDER — PIMAVANSERIN TARTRATE 34 MG PO CAPS
34.0000 mg | ORAL_CAPSULE | Freq: Every day | ORAL | Status: DC
  Administered 2019-06-11 – 2019-06-24 (×14): 34 mg
  Administered 2019-06-25: 34
  Administered 2019-06-26 – 2019-07-25 (×29): 34 mg
  Filled 2019-06-10 (×48): qty 1

## 2019-06-10 MED ORDER — CHLORHEXIDINE GLUCONATE 0.12% ORAL RINSE (MEDLINE KIT)
15.0000 mL | Freq: Two times a day (BID) | OROMUCOSAL | Status: DC
  Administered 2019-06-10 – 2019-07-25 (×90): 15 mL via OROMUCOSAL
  Filled 2019-06-10: qty 15

## 2019-06-10 MED ORDER — PANTOPRAZOLE SODIUM 40 MG IV SOLR: 40.0000 mg | INTRAVENOUS | Status: DC

## 2019-06-10 MED ORDER — EPINEPHRINE 1 MG/10ML IJ SOSY
PREFILLED_SYRINGE | INTRAMUSCULAR | Status: AC
  Filled 2019-06-10: qty 10

## 2019-06-10 MED ORDER — "THROMBI-PAD 3""X3"" EX PADS"
1.0000 | MEDICATED_PAD | CUTANEOUS | Status: AC
  Administered 2019-06-10: 1 via TOPICAL
  Filled 2019-06-10: qty 1

## 2019-06-10 MED ORDER — NOREPINEPHRINE 4 MG/250ML-% IV SOLN
INTRAVENOUS | Status: AC
  Filled 2019-06-10: qty 250

## 2019-06-10 MED ORDER — HEPARIN SODIUM (PORCINE) 5000 UNIT/ML IJ SOLN
5000.0000 [IU] | Freq: Three times a day (TID) | INTRAMUSCULAR | Status: DC
  Administered 2019-06-10 – 2019-06-23 (×41): 5000 [IU] via SUBCUTANEOUS
  Filled 2019-06-10 (×41): qty 1

## 2019-06-10 MED ORDER — AMLODIPINE BESYLATE 5 MG PO TABS: 5.0000 mg | ORAL_TABLET | Freq: Every day | ORAL | Status: DC

## 2019-06-10 MED ORDER — PANTOPRAZOLE SODIUM 40 MG PO PACK
40.0000 mg | PACK | Freq: Every day | ORAL | Status: DC
  Administered 2019-06-10 – 2019-07-25 (×46): 40 mg
  Filled 2019-06-10 (×46): qty 20

## 2019-06-10 MED ORDER — "THROMBI-PAD 3""X3"" EX PADS"
1.0000 | MEDICATED_PAD | Freq: Once | CUTANEOUS | Status: AC
  Administered 2019-06-10: 1 via TOPICAL
  Filled 2019-06-10 (×2): qty 1

## 2019-06-10 MED ORDER — FENTANYL BOLUS VIA INFUSION
25.0000 ug | INTRAVENOUS | Status: DC | PRN
  Administered 2019-06-11 – 2019-06-12 (×3): 25 ug via INTRAVENOUS
  Filled 2019-06-10: qty 25

## 2019-06-10 MED FILL — Medication: Qty: 1 | Status: AC

## 2019-06-10 NOTE — Progress Notes (Signed)
PCCM Interval Progress Note  1.  Significant oozing from L IJ CVL insertion site despite thrombi pads, pressure dressing, sand bag.  - Dressing removed, single horizontal suture placed around line insertion site.  Manual pressure held for 5 minutes then thrombi pad placed over needle entry point just above line insertion site (from prior line attempt) and pressure dressing applied. - Site observed and no further bleeding noted.  2.  I had an extensive discussion with pt's wife, son, daughter, brother (and 2nd daughter on speaker phone). We discussed Derek Harrell's current circumstances and organ failures. We also discussed patient's prior wishes under circumstances such as this. The family would like to visit with pt and they will then have another family meeting to discuss how to proceed (Continue supportive care but institute DNR orders versus comfort care).  Additional CC time: 35 min.   [name], [facility] 06/10/2019, 10:07 AM

## 2019-06-10 NOTE — Progress Notes (Signed)
TTM routine EEG Completed; Results Pending

## 2019-06-10 NOTE — Progress Notes (Signed)
Events of today noted.  Patient now intubated and sedated and undergoing cooling protocol  Concern about mottling of his legs. I was present for LE arterial duplex.  This shows patent vessels down across the ankle.  Waveforms were normal with pressures of around 50 at the anks.  I do not think he has had a embolic event causing compromise to the blood flow in his feet.  The appearance is likely the result of cooling and pressors in addition to his arrest this am.  Continue to monitor doppler exam.    Derek Harrell

## 2019-06-10 NOTE — Progress Notes (Signed)
Chest pt not performed this round.

## 2019-06-10 NOTE — Progress Notes (Addendum)
ABI and bilateral lower extremity arterial duplex completed.  Refer to "CV Proc" under chart review to view preliminary results.  Preliminary results discussed with Dr. Trula Slade.  06/10/2019 2:54 PM Kelby Aline., MHA, RVT, RDCS, RDMS

## 2019-06-10 NOTE — Anesthesia Procedure Notes (Signed)
Procedure Name: Intubation Date/Time: 06/10/2019 3:30 AM Performed by: Jearld Pies, CRNA Pre-anesthesia Checklist: Patient identified, Emergency Drugs available, Suction available and Patient being monitored Patient Re-evaluated:Patient Re-evaluated prior to induction Oxygen Delivery Method: Ambu bag Preoxygenation: Pre-oxygenation with 100% oxygen Ventilation: Mask ventilation without difficulty Laryngoscope Size: Mac and 3 Grade View: Grade II Tube type: Oral Tube size: 7.5 mm Number of attempts: 1 Airway Equipment and Method: Stylet Placement Confirmation: ETT inserted through vocal cords under direct vision,  breath sounds checked- equal and bilateral and CO2 detector Secured at: 24 cm Tube secured with: Tape Dental Injury: Teeth and Oropharynx as per pre-operative assessment

## 2019-06-10 NOTE — Progress Notes (Signed)
Pharmacy Antibiotic Note  Derek Harrell is a 73 y.o. male admitted on 06/06/2019 with weakness.  Pharmacy has been consulted for Vancomycin/Cefepime dosing for r/o sepsis s/p cardiac arrest with ROSC. WBC is increased. Renal function OK but may worsen s/p arrest.   Plan: Vancomycin 1000 mg IV q12h >>Estimated AUC: 459 Cefepime 2g IV q8h Trend WBC, temp, renal function  F/U infectious work-up Drug levels as indicated   Height: 6' (182.9 cm) Weight: 181 lb 3.5 oz (82.2 kg) IBW/kg (Calculated) : 77.6  Temp (24hrs), Avg:98.5 F (36.9 C), Min:97.7 F (36.5 C), Max:100 F (37.8 C)  Recent Labs  Lab 06/06/19 1147 06/07/19 0241 06/07/19 0247 06/09/19 0354 06/10/19 0515 06/10/19 0517  WBC 11.7*  --  10.4 9.0 17.1*  --   CREATININE 0.87  --  0.93 0.94  --   --   LATICACIDVEN 1.5 1.8  --   --   --  3.8*    Estimated Creatinine Clearance: 79.1 mL/min (by C-G formula based on SCr of 0.94 mg/dL).    No Known Allergies  Narda Bonds, PharmD, BCPS Clinical Pharmacist Phone: [phone]

## 2019-06-10 NOTE — Progress Notes (Signed)
RN was called from the tele monitors that pt went to brady and before this pt did have a 4 bt run of Vtach, but non-sustained a little before.    RN went to check pt, pt was unresponsive, not breathing and no pulse.  RN called the code, started chest compressions and CPR.  Once the code team arrived ACLS protocol was started.  After 12 minutes of CPR, pt was back to ROSC.  Pt was then transferred to 2H18, Thanks Arvella Nigh RN.

## 2019-06-10 NOTE — Progress Notes (Signed)
Chaplain engaged in initial visit with Mr. Hainsworth family.  Chaplain accompanied nurse as she explained what they were doing to Mr. Milici body in reference to the Code Cool.  Chaplain let family know that she is here to offer them support as they may need it.  Chaplain assisted in providing updates to family around seeing Mr. Stansbury.  Chaplain will continue to follow-up.

## 2019-06-10 NOTE — Progress Notes (Signed)
Patient began opening eyes to the sound of my voice and tracking movement with his eyes. He became restless and hypertensive and I had already administered allotted prn sedation and pain medication. Notified Francine Graven. Versed and fentanyl drip ordered and initiated.

## 2019-06-10 NOTE — Procedures (Signed)
Patient Name: Derek Harrell  MRN: [id number]  Epilepsy Attending: Lora Havens  Referring Physician/Provider: Francine Graven, NP Date: 06/10/2019  Duration: 23.51 minutes  Patient history: 73 year old male status post cardiac arrest on TTM.  EEG to evaluate for seizures.  Level of alertness: Comatose/sedated  AEDs during EEG study: Versed  Technical aspects: This EEG study was done with scalp electrodes positioned according to the 10-20 International system of electrode placement. Electrical activity was acquired at a sampling rate of 500Hz  and reviewed with a high frequency filter of 70Hz  and a low frequency filter of 1Hz . EEG data were recorded continuously and digitally stored.   Description: EEG showed continuous generalized background suppression.  EEG was not reactive to tactile stimulation.  Hyperventilation and photic stimulation were not performed.  Abnormality -Background suppression, generalized  IMPRESSION: This study is suggestive of profound diffuse encephalopathy, nonspecific to etiology. No seizures or epileptiform discharges were seen throughout the recording.      Duvall Comes Barbra Sarks

## 2019-06-10 NOTE — Progress Notes (Signed)
Chaplain responded to the "Code Cool".  Chaplain notified the wife that she needs to come to the hospital.  The chaplain will pass care on to the unit chaplain.  Brion Aliment Chaplain Resident For questions concerning this note please contact me by pager [phone]

## 2019-06-10 NOTE — Progress Notes (Signed)
Pt asleep at time of scheduled CPT. Chest vest placed in room, will resume treatment in am.

## 2019-06-10 NOTE — Progress Notes (Signed)
Critical care bed requested.

## 2019-06-10 NOTE — Progress Notes (Addendum)
NAME:  Derek Harrell, MRN:  [id number], DOB:  03-Mar-1946, LOS: 4 ADMISSION DATE:  06/06/2019, CONSULTATION DATE:  11/2 REFERRING MD:  Johnney Killian, CHIEF COMPLAINT:  Hypertensive crisis and fever    Brief History   73 year old male w/ h/o parkinson's dementia. admitted 11/2 3 days s/p endovascular infrarenal aortic aneurysm repair w/ worsening weakness, MS change, HTN and fever.   Patient transferred out to the floor 11/5 afternoon. Then 11/6 early AM he suffered cardiac arrest. RN alerted by telemetry to a run of VT. He then became bradycardica and arrested. ACLS done for 12 mins prior to ROSC. He was unresponsive in the post-arrest setting. Transferred to [place] and intubated.   Past Medical History  AAA, prior CVA, HTN, HL, MG, RLS, PD, OSA (did not tolerate CPAP), gait disorder   Significant Hospital Events   11/2-  admitted to ICU 11/3 -  Bradycardic arrest from mucus plugging.   Consults:  Vascular surgery called; recommended treating HTN and medical issues. No imaging at this point   Procedures:  ETT 11/3 > 11/4, 11/6 >  Significant Diagnostic Tests:  CT head 11/2 - Stable white matter disease.  Micro Data:  11/2 - Blood cultures NGTD 11/2 - SARS Cov2 negative.  Antimicrobials:  none  Interim history/subjective:  Patient transferred out to the floor 11/5 afternoon. Then 11/6 early AM he suffered cardiac arrest. RN alerted by telemetry to a run of VT. He then became bradycardica and arrested. ACLS done for 12 mins prior to ROSC. He was unresponsive in the post-arrest setting. Transferred to [place] and intubated.   Objective   Blood pressure (!) 163/87, pulse 70, temperature 98.1 F (36.7 C), temperature source Oral, resp. rate 16, height 6' (1.829 m), weight 82.2 kg, SpO2 100 %.    Vent Mode: PRVC FiO2 (%):  [100 %] 100 % Set Rate:  [20 bmp] 20 bmp Vt Set:  [620 mL] 620 mL PEEP:  [8 cmH20] 8 cmH20 Plateau Pressure:  [21 cmH20] 21 cmH20   Intake/Output Summary (Last 24 hours) at  06/10/2019 0512 Last data filed at 06/10/2019 0100 Gross per 24 hour  Intake 1165 ml  Output 1925 ml  Net -760 ml   Filed Weights   06/08/19 0420 06/09/19 0500 06/09/19 2048  Weight: 83.9 kg 83.1 kg 82.2 kg    Examination:  General: Thin adult male Neuro: Unresponsive GCS 3T HEENT: [place]/AT. Sclerae anicteric. Cardiovascular: RRR, no M/R/G.  Lungs: Even, unlabored, clear.  Abdomen: Soft, NT, ND  Musculoskeletal: No acute deformity or edema.  Skin:Grosly intact  Assessment & Plan:   Cardiac arrest: etiology unclear. He has struggled with airway protection and has previously suffered cardiac arrest due to mucous plug. This arrest started as a run of NSVT and he then suffered a bradycardic arrest. 12 mins duration. He remains unresponsive post arrest with GCS of 3T.  - CT head - EEG - EKG - STAT CBC, CMP, mag, phos, lactic, troponin - TTM 6F protocol  Shock: suspect cardiogenic shock in the post-arrest setting. Cannot rule out septic shock with WBC rising to 17 overnight, especially considering ongoing aspiration concerns. Will add empiric ABX and send cultures.  - Telemetry monitoring - Norepinephrine for MAP goal 56mmHg - Echocardiogram - Careful with IVF - Will place CVL  Acute hypoxemic respiratory failure requiring mechanical ventilation - re intubated 11/6 early AM. It is unclear why he arrested, but it is speculated by the code team to have been a primary respiratory event, which  would make sense given the ongoing concerns for his swallowing/secretion management.  - Full vent support - CXR - ABG - VAP bundle - SBT/WUA once rewarmed.   Hypertensive urgency - now in shock - hold antihypertensives  Parkinson's disease w/ progressive debilitation - Worsening Parkinson's disease symptoms explain poor secretion clearance. - holding home meds  - Cancel PT / OT consult  Dysphagia ?  - will need further eval if able to progress. May benefit from early trach if there is  neurological recovery.   Myasthenia Gravis. May also explain poor secretion clearance.  - Supportive care. May complicate weaning and extubation. May need to consider PLEX if not improving.    OSA non-compliant with CPAP - Supportive care.   Best practice:  Diet: TFs Pain/Anxiety/Delirium protocol (if indicated): N/A.  AVOID ANTIPSYCHOTICS due to Parkinson's. VAP protocol (if indicated):Yes DVT prophylaxis: LMWH GI prophylaxis: PPI Glucose control: SSI. Mobility: BR Code Status: full code  Family Communication: Son Derek Harrell. Updated  Disposition: ICU.     Georgann Housekeeper, AGACNP-BC [place] Pager [phone] or [phone]  06/10/2019 5:31 AM

## 2019-06-10 NOTE — Code Documentation (Signed)
  Patient Name: Olaf Racey   MRN: [id number]   Date of Birth/ Sex: 22-Mar-1946 , male      Admission Date: 06/06/2019  Attending Provider: Marshell Garfinkel, MD  Primary Diagnosis: <principal problem not specified>   Indication: Pt was in his usual state of health until this AM, when he was noted to have several beats of V. tach which progressed to bradycardia and subsequently asystole. Code blue was subsequently called. At the time of arrival on scene, ACLS protocol was underway.   Technical Description:  - CPR performance duration:  12 minutes  - Was defibrillation or cardioversion used? no  - Was external pacer placed? no  - Was patient intubated pre/post CPR? Yes   Medications Administered: Y = Yes; Blank = No Amiodarone    Atropine    Calcium    Epinephrine  y  Lidocaine    Magnesium    Norepinephrine    Phenylephrine    Sodium bicarbonate    Vasopressin     Post CPR evaluation:  - Final Status - Was patient successfully resuscitated ? Yes - What is current rhythm? Sinus  - What is current hemodynamic status? Stable, intubated  Miscellaneous Information:  - Labs sent, including: CBC, BMP  - Primary team notified?  yes  - Family Notified? yes  - Additional notes/ transfer status:  Transferred to [id number]      Earlene Plater, MD Internal Medicine, PGY1 Pager: [phone]  06/10/2019,4:45 AM

## 2019-06-10 NOTE — Progress Notes (Deleted)
Pharmacy Antibiotic Note  Derek Harrell is a 73 y.o. male admitted on 06/06/2019 with weakness. Pharmacy initially consulted for vancomycin and cefepime dosing for rule out of sepsis status post cardiac arrest with ROSC. Pharmacy also consulted for zosyn dosing for aspiration pneumonia.     Plan: Zosyn 3.375g IV q8h (4 hour infusion).  Continue vancomycin 1000 mg IV every 12 hours Continue cefepime 2g IV every 8 hours Trend WBC, temp and renal function Follow cultures   Height: 6' (182.9 cm) Weight: 181 lb 3.5 oz (82.2 kg) IBW/kg (Calculated) : 77.6  Temp (24hrs), Avg:98.5 F (36.9 C), Min:97.7 F (36.5 C), Max:100 F (37.8 C)  Recent Labs  Lab 06/06/19 1147 06/07/19 0241 06/07/19 0247 06/09/19 0354 06/10/19 0515 06/10/19 0517  WBC 11.7*  --  10.4 9.0 17.1*  --   CREATININE 0.87  --  0.93 0.94  --  1.25*  LATICACIDVEN 1.5 1.8  --   --   --  3.8*    Estimated Creatinine Clearance: 59.5 mL/min (A) (by C-G formula based on SCr of 1.25 mg/dL (H)).    No Known Allergies  Antimicrobials this admission: Cefepime 11/6 >>  Vancomycin 11/6 >>  Zosyn 11/6 >>   Microbiology results: 11/2 COVID: neg 11/2 BCx x2: NGTD 11/6 trach asp: sent 11/6 BCx x2: sent 11/6 UCx: sent     Thank you for allowing pharmacy to participate in this patient's care.  Dionel Archey L. Devin Going, [place] PGY1 Pharmacy Resident 06/10/19      8:02 AM  Please check AMION for all [place] phone numbers After 10:00 PM, call the [facility] [phone]

## 2019-06-10 NOTE — Progress Notes (Signed)
Francine Graven notified of troponin levels.

## 2019-06-10 NOTE — Progress Notes (Signed)
SLP Cancellation Note  Patient Details Name: Derek Harrell MRN: [id number] DOB: May 23, 1946   Cancelled treatment:        Pt has experienced significant medical decline.  Now intubated and not appropriate for PO trials.  SLP will sign off at present.  Please re-consult ST when pt is medically appropriate.   Celedonio Savage, [place], [facility] Office: [phone] 06/10/2019, 10:27 AM

## 2019-06-10 NOTE — Progress Notes (Signed)
TTM LTM started; all leads under 5 kohms, educated nurse on event button.

## 2019-06-10 NOTE — Progress Notes (Signed)
Pt. Being transferred to Critical Care unit for higher level of care [id number]). Pts. Significant other, Sherlene Shams,  made aware.

## 2019-06-10 NOTE — Progress Notes (Signed)
Occupational Therapy Discharge Patient Details Name: Derek Harrell MRN: [id number] DOB: Sep 08, 1945 Today's Date: 06/10/2019 Time:  -     Patient discharged from OT services secondary to medical decline - will need to re-order OT to resume therapy services.  Please re-order once pt stable.  [place], OTR/L [place] Pager [phone] Office [phone]   Lucille Passy M 06/10/2019, 10:02 AM

## 2019-06-10 NOTE — Procedures (Signed)
Arterial Catheter Insertion Procedure Note Daegan Derflinger [id number] 1945/11/02  Procedure: Insertion of Arterial Catheter  Indications: Blood pressure monitoring  Procedure Details Consent: Unable to obtain consent because of emergent medical necessity. Time Out: Verified patient identification, verified procedure, site/side was marked, verified correct patient position, special equipment/implants available, medications/allergies/relevent history reviewed, required imaging and test results available.  Performed  Maximum sterile technique was used including antiseptics, cap, gloves, gown, hand hygiene, mask and sheet. Skin prep: Chlorhexidine; local anesthetic administered 20 gauge catheter was inserted into right radial artery using the Seldinger technique. ULTRASOUND GUIDANCE USED: YES Evaluation Blood flow good; BP tracing good. Complications: No apparent complications.   Marlowe Aschoff 06/10/2019

## 2019-06-10 NOTE — Procedures (Signed)
Central Venous Catheter Insertion Procedure Note Coulson Scholle [id number] [birth year]  Procedure: Insertion of Central Venous Catheter Indications: Assessment of intravascular volume, Drug and/or fluid administration and Frequent blood sampling  Procedure Details Consent: Unable to obtain consent because of emergent medical necessity. Time Out: Verified patient identification, verified procedure, site/side was marked, verified correct patient position, special equipment/implants available, medications/allergies/relevent history reviewed, required imaging and test results available.  Performed  Maximum sterile technique was used including antiseptics, cap, gloves, gown, hand hygiene, mask and sheet. Skin prep: Chlorhexidine; local anesthetic administered A antimicrobial bonded/coated triple lumen catheter was placed in the left internal jugular vein using the Seldinger technique. Catheter placed to 20 cm. Blood aspirated via all 3 ports and then flushed x 3. Line sutured x 2 and dressing applied.  Ultrasound guidance used.Yes.    Evaluation Blood flow good Complications: No apparent complications Patient did tolerate procedure well. Chest X-ray ordered to verify placement.  CXR: pending.   Georgann Housekeeper, AGACNP-BC [place] Pager [phone] or [phone]  06/10/2019 6:58 AM

## 2019-06-11 ENCOUNTER — Other Ambulatory Visit: Payer: Self-pay

## 2019-06-11 DIAGNOSIS — G9340 Encephalopathy, unspecified: Secondary | ICD-10-CM | POA: Diagnosis not present

## 2019-06-11 DIAGNOSIS — I469 Cardiac arrest, cause unspecified: Secondary | ICD-10-CM | POA: Diagnosis not present

## 2019-06-11 LAB — URINE CULTURE: Culture: NO GROWTH

## 2019-06-11 LAB — CULTURE, BLOOD (ROUTINE X 2)
Culture: NO GROWTH
Culture: NO GROWTH

## 2019-06-11 LAB — BASIC METABOLIC PANEL
Anion gap: 10 (ref 5–15)
BUN: 50 mg/dL — ABNORMAL HIGH (ref 8–23)
CO2: 20 mmol/L — ABNORMAL LOW (ref 22–32)
Calcium: 7.9 mg/dL — ABNORMAL LOW (ref 8.9–10.3)
Chloride: 119 mmol/L — ABNORMAL HIGH (ref 98–111)
Creatinine, Ser: 1.42 mg/dL — ABNORMAL HIGH (ref 0.61–1.24)
GFR calc Af Amer: 57 mL/min — ABNORMAL LOW (ref >60)
GFR calc non Af Amer: 49 mL/min — ABNORMAL LOW (ref >60)
Glucose, Bld: 107 mg/dL — ABNORMAL HIGH (ref 70–99)
Potassium: 3.8 mmol/L (ref 3.5–5.1)
Sodium: 149 mmol/L — ABNORMAL HIGH (ref 135–145)

## 2019-06-11 LAB — CBC
HCT: 33.2 % — ABNORMAL LOW (ref 39.0–52.0)
Hemoglobin: 10.6 g/dL — ABNORMAL LOW (ref 13.0–17.0)
MCH: 26.6 pg (ref 26.0–34.0)
MCHC: 31.9 g/dL (ref 30.0–36.0)
MCV: 83.2 fL (ref 80.0–100.0)
Platelets: 129 10*3/uL — ABNORMAL LOW (ref 150–400)
RBC: 3.99 MIL/uL — ABNORMAL LOW (ref 4.22–5.81)
RDW: 14 % (ref 11.5–15.5)
WBC: 13.4 10*3/uL — ABNORMAL HIGH (ref 4.0–10.5)
nRBC: 0 % (ref 0.0–0.2)

## 2019-06-11 LAB — MAGNESIUM: Magnesium: 2.3 mg/dL (ref 1.7–2.4)

## 2019-06-11 LAB — GLUCOSE, CAPILLARY
Glucose-Capillary: 105 mg/dL — ABNORMAL HIGH (ref 70–99)
Glucose-Capillary: 107 mg/dL — ABNORMAL HIGH (ref 70–99)
Glucose-Capillary: 108 mg/dL — ABNORMAL HIGH (ref 70–99)
Glucose-Capillary: 113 mg/dL — ABNORMAL HIGH (ref 70–99)
Glucose-Capillary: 124 mg/dL — ABNORMAL HIGH (ref 70–99)
Glucose-Capillary: 99 mg/dL (ref 70–99)

## 2019-06-11 MED ORDER — PRO-STAT SUGAR FREE PO LIQD
30.0000 mL | Freq: Three times a day (TID) | ORAL | Status: DC
  Administered 2019-06-11 – 2019-07-13 (×96): 30 mL
  Filled 2019-06-11 (×93): qty 30

## 2019-06-11 MED ORDER — VITAL AF 1.2 CAL PO LIQD
1000.0000 mL | ORAL | Status: DC
  Administered 2019-06-11 – 2019-06-29 (×17): 1000 mL
  Filled 2019-06-11 (×4): qty 1000

## 2019-06-11 NOTE — Progress Notes (Signed)
 Nutrition Follow-up   RD working remotely.  DOCUMENTATION CODES:   Not applicable  INTERVENTION:   Tube Feeding:  Vital AF 1.2 at 55 ml/hr Pro-Stat 30 mL TID Provides 1884 kcals, 144 g of protein and 1069 mL of free water Meets 100% estimated calorie and protein needs   NUTRITION DIAGNOSIS:   Inadequate oral intake related to inability to eat as evidenced by NPO status.  Being addressed via TF   GOAL:   Patient will meet greater than or equal to 90% of their needs  Progressing  MONITOR:   Vent status, Labs, TF tolerance, Skin  REASON FOR ASSESSMENT:   Ventilator, Consult Enteral/tube feeding initiation and management  ASSESSMENT:   73 yo male admitted with worsening weakness, MS change, HTN, fever. PMH includes HTN, AAA, HLD, CVD, obesity, stroke, myasthenia gravis, RLS, Parkinson's disease, dementia.  11/02 Admit 11/03 [place] 11/04 Extubated 11/06 Cardiac arrest, Re-intubated, TTM 36C   Patient is currently intubated on ventilator support MV: 12.1 L/min Temp (24hrs), Avg:96.9 F (36.1 C), Min:95.2 F (35.1 C), Max:98.6 F (37 C)  Propofol: NONE  TF has been off since cardiac arrest  Current wt 80.2 kg; admit weight 82 kg   Labs: sodium 149 (H), Creatinine 1.42, CBGs 99-124 Meds: ss novolog, NS at 125 ml/hr   Diet Order:   Diet Order            Diet NPO time specified  Diet effective now              EDUCATION NEEDS:   No education needs have been identified at this time  Skin:  Skin Assessment: Skin Integrity Issues: Skin Integrity Issues:: Stage II, Incisions Stage II: buttocks Incisions: groin  Last BM:  11/6  Height:   Ht Readings from Last 1 Encounters:  06/06/19 6' (1.829 m)    Weight:   Wt Readings from Last 1 Encounters:  06/10/19 80.2 kg    Ideal Body Weight:  80.9 kg  BMI:  Body mass index is 23.98 kg/m.  Estimated Nutritional Needs:   Kcal:  1876 kcals  Protein:  120-160  g  Fluid:  >/= 2  L     Kelsea Mousel MS, RDN, LDN, CNSC [phone] Pager  [phone] Weekend/On-Call Pager

## 2019-06-11 NOTE — Progress Notes (Signed)
Patient ID: Derek Harrell, male   DOB: 01/24/1946, 72 y.o.   MRN: [id number] Unresponsive on vent.  Feet warm with audible Doppler flow Ankle arm index 0.65 on the right and 1.0 on the left Lower extremity duplex yesterday with no obstruction Following from side

## 2019-06-11 NOTE — Progress Notes (Addendum)
NAME:  Derek Harrell, MRN:  [id number], DOB:  05-27-1946, LOS: 5 ADMISSION DATE:  06/06/2019, CONSULTATION DATE:  11/2 REFERRING MD:  Johnney Killian, CHIEF COMPLAINT:  Hypertensive crisis and fever    Brief History   73 year old male w/ h/o parkinson's dementia. admitted 11/2 3 days s/p endovascular infrarenal aortic aneurysm repair w/ worsening weakness, MS change, HTN and fever.   Patient transferred out to the floor 11/5 afternoon. Then 11/6 early AM he suffered cardiac arrest. RN alerted by telemetry to a run of VT. He then became bradycardica and arrested. ACLS done for 12 mins prior to ROSC. He was unresponsive in the post-arrest setting. Transferred to [place] and intubated.   Past Medical History  AAA, prior CVA, HTN, HL, MG, RLS, PD, OSA (did not tolerate CPAP), gait disorder   Significant Hospital Events   11/2-  admitted to ICU 11/3 -  Bradycardic arrest from mucus plugging.   Consults:  Vascular surgery called; recommended treating HTN and medical issues. No imaging at this point   Procedures:  ETT 11/3 > 11/4, 11/6 >  Significant Diagnostic Tests:  CT head 11/2 - Stable white matter disease.  Micro Data:  11/2 - Blood cultures NGTD 11/2 - SARS Cov2 negative. 11/6-blood cultures >> 11/6-urine culture >>   Antimicrobials:  Cefepime 11/6 >>  Vancomycin 11/6 >>   Interim history/subjective:  Remains mechanically ventilated following ACLS 11/6. RN reported some spontaneous movement, open eyes, possibly tracking on 11/6, Versed and fentanyl infusion started that afternoon. On TTM 36C, now rewarming to 37C  Objective   Blood pressure 119/76, pulse 70, temperature (!) 95.2 F (35.1 C), temperature source Esophageal, resp. rate 20, height 6' (1.829 m), weight 80.2 kg, SpO2 100 %. CVP:  [0 mmHg-20 mmHg] 11 mmHg  Vent Mode: PRVC FiO2 (%):  [40 %] 40 % Set Rate:  [20 bmp] 20 bmp Vt Set:  [620 mL] 620 mL PEEP:  [5 cmH20] 5 cmH20 Plateau Pressure:  [17 cmH20-19 cmH20] 19 cmH20    Intake/Output Summary (Last 24 hours) at 06/11/2019 1036 Last data filed at 06/11/2019 1000 Gross per 24 hour  Intake 3054.62 ml  Output 952 ml  Net 2102.62 ml   Filed Weights   06/09/19 0500 06/09/19 2048 06/10/19 0500  Weight: 83.1 kg 82.2 kg 80.2 kg    Examination:  General: Thin chronically ill-appearing man, intubated, sedated Neuro: Flickers eyes to sternal rub, both eyes spontaneously partially open, did not track, did not follow commands.  Left arm flexed but not contracted.  Currently on Versed, fentanyl HEENT: ET tube in place, sclera anicteric Cardiovascular: Regular, distant, no murmur Lungs: Clear bilaterally, no wheezing Abdomen: Nondistended, positive bowel sounds Musculoskeletal: No significant edema Skin: No rash noted  Assessment & Plan:   Cardiac arrest: etiology unclear. He has struggled with airway protection and has previously suffered cardiac arrest due to mucous plug. This arrest started as a run of NSVT and he then suffered a bradycardic arrest. 12 mins duration.  Suspect multifactorial respiratory failure led to cardiopulmonary arrest -Now in the rewarming phase of TTM, goal temperature 37 -Follow telemetry, consider repeat TTE depending on hemodynamics  Shock: suspect cardiogenic shock in the post-arrest setting. Cannot rule out septic shock with WBC rising to 17 overnight, especially considering ongoing aspiration concerns.  -Norepinephrine weaned to off, goal MAP 65 -Empiric cefepime and vancomycin initiated 11/6 given concern for possible aspiration or hospital-acquired infection -Consider stress dose steroids if recurrent shock  Acute hypoxemic respiratory failure requiring  mechanical ventilation - re intubated 11/6 early AM. It is unclear why he arrested, but it is speculated by the code team to have been a primary respiratory event, which would make sense given the ongoing concerns for his swallowing/secretion management.  -PRVC 8 cc/kg -Assess for  spontaneous breathing depending on improvement in his mental status. -Overall picture complicated by his poor airway protection and secretion management in the setting of Parkinson's, myasthenia.  Will need to discuss long-range plans, even possibly tracheostomy if he survives this acute event  HTN -shock resolved, started amlodipine, hydralazine prn  Acute encephalopathy.  Multifactorial but at risk for hypoxic injury due to arrest -Head CT reassuring, no acute change compared with prior -EEG consistent with encephalopathy without any active seizures -Okay to initiate weaning from sedation, assess for neurological status, recovery  Parkinson's disease w/ progressive debilitation - Worsening Parkinson's disease symptoms explain poor secretion clearance. -Rytary started per tube  Dysphagia   - will need further eval if able to progress. SLP eval certainly. May benefit from early trach if there is neurological recovery.   Myasthenia Gravis. May also explain poor secretion clearance.  -continue mestanon.  Weakness could influence ability to extubate, protect his airway.  Question whether he may require plasma exchange.  Would defer this until we sort out the details of his neurological recovery, mental status  OSA non-compliant with CPAP -Can pursue possible CPAP if he achieves extubation  Best practice:  Diet: TFs Pain/Anxiety/Delirium protocol (if indicated): N/A.  AVOID ANTIPSYCHOTICS due to Parkinson's. VAP protocol (if indicated):Yes DVT prophylaxis: LMWH GI prophylaxis: PPI Glucose control: SSI. Mobility: BR Code Status: full code  Family Communication: Wife, son, daughters updated by R. Shearon Stalls 11/6 Disposition: ICU.    Independent critical care time 33 minutes  Baltazar Apo, MD, PhD 06/11/2019, 11:00 AM [facility] [phone] or if no answer [phone]

## 2019-06-11 NOTE — Procedures (Addendum)
Patient Name: Derek Harrell  MRN: [id number]  Epilepsy Attending: Lora Havens  Referring Physician/Provider: Francine Graven, NP Duration: 06/10/2019 1111 to 06/11/2019 1111  Patient history: 73 year old male status post cardiac arrest on TTM.  EEG to evaluate for seizures.  Level of alertness: Comatose/sedated  AEDs during EEG study: Versed  Technical aspects: This EEG study was done with scalp electrodes positioned according to the 10-20 International system of electrode placement. Electrical activity was acquired at a sampling rate of 500Hz  and reviewed with a high frequency filter of 70Hz  and a low frequency filter of 1Hz . EEG data were recorded continuously and digitally stored.   Description: EEG initially showed continuous generalized background suppression. Gradually, after midnight, intermittent 2-3Hz  delta slowing was noted which continued to evolve into burst suppression pattern with 2-4 seconds of generalized suppression and 2-5seconds of 2-5hz  theta-delta slowing, at times with triphasic morphology.  EEG was reactive to tactile stimulation.  Hyperventilation and photic stimulation were not performed.  Abnormality -Burst suppression, generalized  IMPRESSION: This study is suggestive of profound diffuse encephalopathy, nonspecific to etiology. No seizures or epileptiform discharges were seen throughout the recording.   EEG is improved compared to routine eeg yesterday.  06/10/2019 1341    06/11/2019 [place]

## 2019-06-11 NOTE — Progress Notes (Signed)
LTM maint complete - no skin breakdown under:  Fp1, Fp2, and M1

## 2019-06-12 ENCOUNTER — Inpatient Hospital Stay (HOSPITAL_COMMUNITY): Payer: Medicare Other

## 2019-06-12 DIAGNOSIS — J9601 Acute respiratory failure with hypoxia: Secondary | ICD-10-CM

## 2019-06-12 DIAGNOSIS — I469 Cardiac arrest, cause unspecified: Secondary | ICD-10-CM | POA: Diagnosis not present

## 2019-06-12 LAB — BASIC METABOLIC PANEL
Anion gap: 8 (ref 5–15)
Anion gap: 7 (ref 5–15)
Anion gap: 6 (ref 5–15)
BUN: 59 mg/dL — ABNORMAL HIGH (ref 8–23)
BUN: 59 mg/dL — ABNORMAL HIGH (ref 8–23)
BUN: 58 mg/dL — ABNORMAL HIGH (ref 8–23)
CO2: 18 mmol/L — ABNORMAL LOW (ref 22–32)
CO2: 19 mmol/L — ABNORMAL LOW (ref 22–32)
CO2: 20 mmol/L — ABNORMAL LOW (ref 22–32)
Calcium: 7.6 mg/dL — ABNORMAL LOW (ref 8.9–10.3)
Calcium: 7.7 mg/dL — ABNORMAL LOW (ref 8.9–10.3)
Calcium: 8 mg/dL — ABNORMAL LOW (ref 8.9–10.3)
Chloride: 120 mmol/L — ABNORMAL HIGH (ref 98–111)
Chloride: 123 mmol/L — ABNORMAL HIGH (ref 98–111)
Chloride: 123 mmol/L — ABNORMAL HIGH (ref 98–111)
Creatinine, Ser: 1.49 mg/dL — ABNORMAL HIGH (ref 0.61–1.24)
Creatinine, Ser: 1.47 mg/dL — ABNORMAL HIGH (ref 0.61–1.24)
Creatinine, Ser: 1.32 mg/dL — ABNORMAL HIGH (ref 0.61–1.24)
GFR calc Af Amer: 54 mL/min — ABNORMAL LOW (ref >60)
GFR calc Af Amer: 55 mL/min — ABNORMAL LOW (ref >60)
GFR calc Af Amer: >60 mL/min (ref >60)
GFR calc non Af Amer: 47 mL/min — ABNORMAL LOW (ref >60)
GFR calc non Af Amer: 47 mL/min — ABNORMAL LOW (ref >60)
GFR calc non Af Amer: 54 mL/min — ABNORMAL LOW (ref >60)
Glucose, Bld: 140 mg/dL — ABNORMAL HIGH (ref 70–99)
Glucose, Bld: 130 mg/dL — ABNORMAL HIGH (ref 70–99)
Glucose, Bld: 127 mg/dL — ABNORMAL HIGH (ref 70–99)
Potassium: 3.6 mmol/L (ref 3.5–5.1)
Potassium: 3.5 mmol/L (ref 3.5–5.1)
Potassium: 4.1 mmol/L (ref 3.5–5.1)
Sodium: 146 mmol/L — ABNORMAL HIGH (ref 135–145)
Sodium: 149 mmol/L — ABNORMAL HIGH (ref 135–145)
Sodium: 149 mmol/L — ABNORMAL HIGH (ref 135–145)

## 2019-06-12 LAB — POCT I-STAT 7, (LYTES, BLD GAS, ICA,H+H)
Acid-base deficit: 6 mmol/L — ABNORMAL HIGH (ref 0.0–2.0)
Bicarbonate: 19.3 mmol/L — ABNORMAL LOW (ref 20.0–28.0)
Calcium, Ion: 1.24 mmol/L (ref 1.15–1.40)
HCT: 26 % — ABNORMAL LOW (ref 39.0–52.0)
Hemoglobin: 8.8 g/dL — ABNORMAL LOW (ref 13.0–17.0)
O2 Saturation: 99 %
Potassium: 4.4 mmol/L (ref 3.5–5.1)
Sodium: 152 mmol/L — ABNORMAL HIGH (ref 135–145)
TCO2: 20 mmol/L — ABNORMAL LOW (ref 22–32)
pCO2 arterial: 38.5 mmHg (ref 32.0–48.0)
pH, Arterial: 7.307 — ABNORMAL LOW (ref 7.350–7.450)
pO2, Arterial: 127 mmHg — ABNORMAL HIGH (ref 83.0–108.0)

## 2019-06-12 LAB — MAGNESIUM
Magnesium: 2.3 mg/dL (ref 1.7–2.4)
Magnesium: 2.3 mg/dL (ref 1.7–2.4)

## 2019-06-12 LAB — GLUCOSE, CAPILLARY
Glucose-Capillary: 110 mg/dL — ABNORMAL HIGH (ref 70–99)
Glucose-Capillary: 116 mg/dL — ABNORMAL HIGH (ref 70–99)
Glucose-Capillary: 119 mg/dL — ABNORMAL HIGH (ref 70–99)
Glucose-Capillary: 122 mg/dL — ABNORMAL HIGH (ref 70–99)
Glucose-Capillary: 123 mg/dL — ABNORMAL HIGH (ref 70–99)
Glucose-Capillary: 126 mg/dL — ABNORMAL HIGH (ref 70–99)
Glucose-Capillary: 129 mg/dL — ABNORMAL HIGH (ref 70–99)

## 2019-06-12 LAB — VANCOMYCIN, PEAK: Vancomycin Pk: 42 ug/mL — ABNORMAL HIGH (ref 30–40)

## 2019-06-12 LAB — CULTURE, RESPIRATORY W GRAM STAIN: Culture: NORMAL

## 2019-06-12 LAB — CBC
HCT: 27.7 % — ABNORMAL LOW (ref 39.0–52.0)
Hemoglobin: 8.8 g/dL — ABNORMAL LOW (ref 13.0–17.0)
MCH: 27 pg (ref 26.0–34.0)
MCHC: 31.8 g/dL (ref 30.0–36.0)
MCV: 85 fL (ref 80.0–100.0)
Platelets: 121 10*3/uL — ABNORMAL LOW (ref 150–400)
RBC: 3.26 MIL/uL — ABNORMAL LOW (ref 4.22–5.81)
RDW: 14.2 % (ref 11.5–15.5)
WBC: 12.9 10*3/uL — ABNORMAL HIGH (ref 4.0–10.5)
nRBC: 0 % (ref 0.0–0.2)

## 2019-06-12 LAB — ECHOCARDIOGRAM COMPLETE
Height: 72 in
Weight: 2903.02 oz

## 2019-06-12 MED ORDER — PANCRELIPASE (LIP-PROT-AMYL) 10440-39150 UNITS PO TABS
20880.0000 [IU] | ORAL_TABLET | Freq: Once | ORAL | Status: AC
  Administered 2019-06-12: 20880 [IU]
  Filled 2019-06-12: qty 2

## 2019-06-12 MED ORDER — MIDAZOLAM HCL 2 MG/2ML IJ SOLN
1.0000 mg | INTRAMUSCULAR | Status: DC | PRN
  Administered 2019-06-12: 11:00:00 1 mg via INTRAVENOUS
  Filled 2019-06-12: qty 2

## 2019-06-12 MED ORDER — SODIUM BICARBONATE 650 MG PO TABS
650.0000 mg | ORAL_TABLET | Freq: Once | ORAL | Status: AC
  Administered 2019-06-12: 650 mg
  Filled 2019-06-12: qty 1

## 2019-06-12 MED ORDER — MIDAZOLAM HCL 2 MG/2ML IJ SOLN
2.0000 mg | INTRAMUSCULAR | Status: DC | PRN
  Filled 2019-06-12: qty 2

## 2019-06-12 MED ORDER — FREE WATER
100.0000 mL | Status: DC
  Administered 2019-06-12 – 2019-06-13 (×7): 100 mL

## 2019-06-12 MED ORDER — POTASSIUM CHLORIDE 20 MEQ/15ML (10%) PO SOLN
40.0000 meq | Freq: Once | ORAL | Status: AC
  Administered 2019-06-12: 10:00:00 40 meq via ORAL
  Filled 2019-06-12: qty 30

## 2019-06-12 NOTE — Progress Notes (Signed)
LTM maint complete - no skin breakdown under:

## 2019-06-12 NOTE — Progress Notes (Signed)
  Echocardiogram 2D Echocardiogram has been performed.  Burnett Kanaris 06/12/2019, 2:36 PM

## 2019-06-12 NOTE — Consult Note (Addendum)
Neurology Consultation  Reason for Consult: Encephalopathy post cardiac arrest status post TTM Referring Physician: Dr. Lamonte Sakai  CC: Encephalopathy, prognostication  History is obtained from: Chart review  HPI: Derek Harrell is a 73 y.o. male who has a past medical history of myasthenia gravis, idiopathic Parkinson's disease, dementia, prior left pontine stroke, admitted on 06/07/2019, 3 days after his endovascular infrarenal aortic aneurysm repair with fevers, hypertensive crisis and worsening weakness. He had a temperature of 660.6 with systolic blood pressure in the 200s in the emergency room.  He became bradycardic and had respiratory distress requiring intubation.  Admitted to the ICU and was improving some in terms of his breathing-it was felt that his respiratory distress was due to his deteriorating dementia, Parkinson's and inability to clear his secretions.  He was extubated, 06/08/2019, doing reasonably well enough for consideration to transfer to floor was transferred to the floor where he had a PEA cardiac arrest on 11/6 early morning requiring CPR for 12 minutes. He was rewarmed at noon yesterday and continues to be extremely encephalopathic and having poor neuro exam for which a neurological consultation was placed. He was hooked up to EEG all throughout the duration of his hypothermia until this morning.  Burst suppression and some GPD's were observed but no seizure activity.  Per review of notes, he is a patient of Dr. Wells Guiles Tat at [facility] neurology for myasthenia gravis and parkinsonism-likely idiopathic Parkinson's disease.  On Rytary.  On Mestinon. Has exhibited a lot of hallucinations-likely related to his Parkinson's.  He was put on Nuplazid for his Parkinson's hallucinations associated with Parkinson's dementia.  Per the follow-up notes from the hospital, he has been having a progressively worsening course in terms of his mentation and ability to perform his ADLs.   ROS: ROS was  performed and is negative except as noted in the HPI.    Past Medical History:  Diagnosis Date  . Abdominal aortic aneurysm ([place])   . Abnormality of gait 04/12/2013  . Cerebrovascular disease   . Constipation   . Dizziness   . Gait disorder   . Hyperlipidemia   . Hypertension   . 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 Myasthenia gravis ([place]) 01/07/2016  . Obesity   . Parkinson disease ([place])   . Pseudobulbar affect   . RLS (restless legs syndrome) 06/09/2016  . Sleep apnea    "I tried CPAP; didn't work for me" (08/22/2013)  . Stroke [facility]) ?2009   Left pontine stroke; denies residual on 08/22/2013   Family History  Problem Relation Age of Onset  . Heart disease Mother   . Heart disease Father   . Colon cancer Brother   . Heart disease Brother   . Heart disease Brother   . Healthy Son   . Healthy Daughter   . Healthy Daughter    Social History:   reports that he quit smoking about 26 years ago. His smoking use included cigarettes. He has a 1.56 pack-year smoking history. He has never used smokeless tobacco. He reports previous alcohol use. He reports that he does not use drugs.  Medications  Current Facility-Administered Medications:  .  Place/Maintain arterial line, , , Until Discontinued **AND** 0.9 %  sodium chloride infusion, , Intra-arterial, PRN, Corey Harold, NP, Stopped at 06/10/19 0000 .  amLODipine (NORVASC) tablet 10 mg, 10 mg, Per Tube, Daily, Alfonzo Feller, NP, 10 mg at 06/12/19 0942 .  aspirin chewable tablet 81 mg, 81 mg, Per Tube, Daily, Mannam, Praveen,  MD, 81 mg at 06/12/19 0942 .  Carbidopa-Levodopa ER 48.75-195 MG CPCR 1 tablet, 1 tablet, Per Tube, 5 X Daily, Mannam, Praveen, MD, 1 tablet at 06/12/19 0942 .  ceFEPIme (MAXIPIME) 2 g in sodium chloride 0.9 % 100 mL IVPB, 2 g, Intravenous, Q8H, Erenest Blank, RPH, Last Rate: 200 mL/hr at 06/12/19 0546, 2 g at 06/12/19 0546 .  chlorhexidine gluconate (MEDLINE KIT) (PERIDEX) 0.12 % solution 15 mL, 15 mL, Mouth  Rinse, BID, Byrum, Rose Fillers, MD, 15 mL at 06/12/19 0800 .  Chlorhexidine Gluconate Cloth 2 % PADS 6 each, 6 each, Topical, Daily, Mannam, Praveen, MD, 6 each at 06/11/19 0936 .  clopidogrel (PLAVIX) tablet 75 mg, 75 mg, Per Tube, Daily, Mannam, Praveen, MD, 75 mg at 06/12/19 0942 .  feeding supplement (PRO-STAT SUGAR FREE 64) liquid 30 mL, 30 mL, Per Tube, TID, Collene Gobble, MD, 30 mL at 06/12/19 0941 .  feeding supplement (VITAL AF 1.2 CAL) liquid 1,000 mL, 1,000 mL, Per Tube, Continuous, Collene Gobble, MD, Last Rate: 55 mL/hr at 06/12/19 0947, 1,000 mL at 06/12/19 0947 .  fentaNYL (SUBLIMAZE) bolus via infusion 25 mcg, 25 mcg, Intravenous, Q15 min PRN, Alfonzo Feller, NP, 25 mcg at 06/12/19 0615 .  fentaNYL (SUBLIMAZE) injection 25-100 mcg, 25-100 mcg, Intravenous, Q30 min PRN, Alfonzo Feller, NP, 50 mcg at 06/12/19 1014 .  fentaNYL 257mg in NS 2525m(1022mml) infusion-PREMIX, 25-200 mcg/hr, Intravenous, Continuous, WriAlfonzo FellerP, Last Rate: 3.5 mL/hr at 06/12/19 1000, 35 mcg/hr at 06/12/19 1000 .  free water 100 mL, 100 mL, Per Tube, Q4H, EubOmar PersonP, 100 mL at 06/12/19 0943 .  heparin injection 5,000 Units, 5,000 Units, Subcutaneous, Q8H, WriAlfonzo FellerP, 5,000 Units at 06/12/19 0545 .  hydrALAZINE (APRESOLINE) injection 10 mg, 10 mg, Intravenous, Q6H PRN, WriAlfonzo FellerP .  insulin aspart (novoLOG) injection 1-3 Units, 1-3 Units, Subcutaneous, Q4H, AgaKipp BroodD, 1 Units at 06/11/19 1946 .  lactated ringers infusion, , Intravenous, Continuous, HofCorey HaroldP, Stopped at 06/08/19 1106 .  MEDLINE mouth rinse, 15 mL, Mouth Rinse, 10 times per [place], ByrCollene GobbleD, 15 mL at 06/12/19 0943 .  midazolam (VERSED) injection 1 mg, 1 mg, Intravenous, Q1H PRN, EubOmar PersonP, 1 mg at 06/12/19 1044 .  pantoprazole sodium (PROTONIX) 40 mg/20 mL oral suspension 40 mg, 40 mg, Per Tube, Daily, ByrCollene GobbleD, 40 mg at 06/12/19 0942 .   Pimavanserin Tartrate CAPS 34 mg, 34 mg, Per Tube, Daily, ByrCollene GobbleD, 34 mg at 06/12/19 0943 .  pyridostigmine (MESTINON) 60 MG/5ML solution 60 mg, 60 mg, Per Tube, BID, ByrCollene GobbleD, 60 mg at 06/12/19 0545 .  rosuvastatin (CRESTOR) tablet 10 mg, 10 mg, Per Tube, q1800, Mannam, Praveen, MD, 10 mg at 06/11/19 1820 .  vancomycin (VANCOCIN) IVPB 1000 mg/200 mL premix, 1,000 mg, Intravenous, Q12H, LedErenest BlankPH, Last Rate: 200 mL/hr at 06/12/19 0941, 1,000 mg at 06/12/19 0941  Exam: Current vital signs: BP (!) 176/88   Pulse 79   Temp 99 F (37.2 C) (Esophageal)   Resp 19   Ht 6' (1.829 m)   Wt 82.3 kg   SpO2 100%   BMI 24.61 kg/m  Vital signs in last 24 hours: Temp:  [98.1 F (36.7 C)-99 F (37.2 C)] 99 F (37.2 C) (11/08 0800) Pulse Rate:  [30-151] 79 (11/08 1000) Resp:  [19-27] 19 (11/08 1000) BP: (108-176)/(52-88)  176/88 (11/08 1000) SpO2:  [94 %-100 %] 100 % (11/08 1000) Arterial Line BP: (116-230)/(47-94) 230/94 (11/08 1000) FiO2 (%):  [40 %] 40 % (11/08 0800) Weight:  [82.3 kg] 82.3 kg (11/08 0402) General: Intubated, on sedation with some fentanyl. HEENT: Normocephalic atraumatic Lungs: Clear to auscultation, vented Cardiovascular: Regular rate rhythm Abdomen: Soft nondistended nontender Extremities warm well perfused Neurological exam He is intubated, on some sedation with fentanyl. He is breathing over the ventilator He has a strong cough and gag He has positive corneals and oculocephalics Cranial nerve examination: Pupils are equal round reactive to light, gaze is mildly disconjugate with right eye exotropia, does not blink to threat from either side, difficult ascertain facial symmetry due to the infected. Motor exam: No spontaneous movements noted. Sensory exam: No response to noxious stimulation other than some clenching of his eyelids.  The EEG that was hooked up was reactive to noxious stimulation. Coordination cannot be tested Gait  cannot be tested  Labs I have reviewed labs in epic and the results pertinent to this consultation are: CBC    Component Value Date/Time   WBC 12.9 (H) 06/12/2019 0348   RBC 3.26 (L) 06/12/2019 0348   HGB 8.8 (L) 06/12/2019 1008   HGB 14.8 01/07/2016 0950   HCT 26.0 (L) 06/12/2019 1008   HCT 43.5 01/07/2016 0950   PLT 121 (L) 06/12/2019 0348   PLT 179 01/07/2016 0950   MCV 85.0 06/12/2019 0348   MCV 80 01/07/2016 0950   MCH 27.0 06/12/2019 0348   MCHC 31.8 06/12/2019 0348   RDW 14.2 06/12/2019 0348   RDW 13.8 01/07/2016 0950   LYMPHSABS 0.8 06/10/2019 0515   LYMPHSABS 1.5 01/07/2016 0950   MONOABS 0.8 06/10/2019 0515   EOSABS 0.2 06/10/2019 0515   EOSABS 0.3 01/07/2016 0950   BASOSABS 0.1 06/10/2019 0515   BASOSABS 0.1 01/07/2016 0950    CMP     Component Value Date/Time   NA 152 (H) 06/12/2019 1008   NA 142 01/07/2016 0950   K 4.4 06/12/2019 1008   CL 123 (H) 06/12/2019 0348   CO2 19 (L) 06/12/2019 0348   GLUCOSE 130 (H) 06/12/2019 0348   BUN 59 (H) 06/12/2019 0348   BUN 9 01/07/2016 0950   CREATININE 1.47 (H) 06/12/2019 0348   CREATININE 0.82 10/26/2017 0918   CALCIUM 7.7 (L) 06/12/2019 0348   PROT 6.6 06/06/2019 1147   PROT 7.1 01/07/2016 0950   ALBUMIN 3.2 (L) 06/06/2019 1147   ALBUMIN 4.0 01/07/2016 0950   AST 39 06/06/2019 1147   ALT 8 06/06/2019 1147   ALKPHOS 46 06/06/2019 1147   BILITOT 2.1 (H) 06/06/2019 1147   BILITOT 0.7 01/07/2016 0950   GFRNONAA 47 (L) 06/12/2019 0348   GFRAA 55 (L) 06/12/2019 0348   Imaging I have reviewed the images obtained:  CT-scan of the brain on 06/06/2019 and 06/10/2019 unremarkable for any acute process.  Extensive chronic small vessel disease  LTM EEG for the past 24 hours shows profound encephalopathy nonspecific in etiology with no seizures or epileptiform discharges.  EEG has improved from the [place] prior.  He was hooked up to EEG for the entire duration of his therapeutic temperature management.  Please see Dr.  Karolee Stamps detailed reports of EEGs.  Assessment: 73 year old with what sounds like pretty severe history of myasthenia gravis, idiopathic parkinsonism, Parkinson's associated dementia and hallucinations, myasthenia gravis, recent infrarenal aortic aneurysm repair, restless leg, left pontine stroke in 2015 without residual deficits, admitted to the cardiac ICU  after he had a cardiac arrest on the floor-PEA for 12 minutes prior to return responded circulation. Remains encephalopathic after rewarming and being off of sedation. He was intubated initially for mucous plugging and inability to clear his secretions and then again after the cardiac arrest. He does have all his brainstem reflexes intact. I could not get anything more on the exam other than the brainstem reflexes but given the fact that at baseline he has such advanced neuro degenerative process, I would expect a recovery to be slow and I am not entirely certain that the recovery will be full back to the baseline. Going by the notes it seems like that he has been having a pretty substantial deterioration in his abilities to take care of himself over the past few months to years and it is also becoming tougher for his wife to take care of him.  As far as prognostication is concerned, a patient is undergoing TTM, requires waiting at least for 72 hours prior to any prognostication is made.  He was rewarmed on Saturday, 06/11/2019 at noon so a good exam that would provide more information for prognostication would be one that is done last known on 06/14/2019.  That said, poor baseline with extremely poor cognitive/brain reserve along with multiple medical conditions, prolonged hospitalization, cardiac arrest - these do not portend a good prognosis for neurologically meaningful recovery.  Neurology will follow with you to update assessment and recommendations.  Impression: Hypoxic anoxic encephalopathy Hypoxic/anoxic brain injury History of myasthenia  gravis History of Parkinson's  Recommendations: I would continue him on Mestinon 60 mg-3 times daily I would continue his carbidopa/levodopa at home doses well. I would recommend obtaining an MRI of the brain tomorrow without contrast to assess for hypoxic/anoxic damage that might have entailed. Correction of toxic metabolic derangements per primary team as you are. I will discontinue the LTM monitoring for now.  Neurology will follow with you.  Most beneficial exam for prognostication would be obtained on 06/14/2019.  I will be signing out his care to the oncoming neuro hospitalist for the week who will take over his care.  I discussed my plan with Hayden Pedro, NP from the critical care team.  -- Amie Portland, MD [facility] Pager: [phone] If 7pm to 7am, please call on call as listed on AMION.  CRITICAL CARE ATTESTATION Performed by: Amie Portland, MD Total critical care time: 35 minutes Critical care time was exclusive of separately billable procedures and treating other patients and/or supervising APPs/Residents/Students Critical care was necessary to treat or prevent imminent or life-threatening deterioration due to hypoxic ischemic encephalopathy This patient is critically ill and at significant risk for neurological worsening and/or death and care requires constant monitoring. Critical care was time spent personally by me on the following activities: development of treatment plan with patient and/or surrogate as well as nursing, discussions with consultants, evaluation of patient's response to treatment, examination of patient, obtaining history from patient or surrogate, ordering and performing treatments and interventions, ordering and review of laboratory studies, ordering and review of radiographic studies, pulse oximetry, re-evaluation of patient's condition, participation in multidisciplinary rounds and medical decision making of high complexity in the care  of this patient.

## 2019-06-12 NOTE — Progress Notes (Addendum)
NAME:  Derek Harrell, MRN:  [id number], DOB:  05/30/1946, LOS: 6 ADMISSION DATE:  06/06/2019, CONSULTATION DATE:  11/2 REFERRING MD:  Johnney Killian, CHIEF COMPLAINT:  Hypertensive crisis and fever    Brief History   73 year old male w/ h/o parkinson's dementia. admitted 11/2 3 days s/p endovascular infrarenal aortic aneurysm repair w/ worsening weakness, MS change, HTN and fever.   Patient transferred out to the floor 11/5 afternoon. Then 11/6 early AM he suffered cardiac arrest. RN alerted by telemetry to a run of VT. He then became bradycardica and arrested. ACLS done for 12 mins prior to ROSC. He was unresponsive in the post-arrest setting. Transferred to [place] and intubated.   Past Medical History  AAA, prior CVA, HTN, HL, MG, RLS, PD, OSA (did not tolerate CPAP), gait disorder   Significant Hospital Events   11/2-  admitted to ICU 11/3 -  Bradycardic arrest from mucus plugging.  11/6-  PEA Cardiac Arrest, ROSC after 12 minutes >> began TTM to 36 11/7- Re-warming 11/7 completed at noon   Consults:  Vascular surgery called; recommended treating HTN and medical issues. No imaging at this point   Procedures:  ETT 11/3 > 11/4, 11/6 > CVC LIJ 11/06 >> Right Radial Aline 11/06 >>   Significant Diagnostic Tests:  CT head 11/2 - Stable white matter disease.  Micro Data:  11/2 - Blood cultures NGTD 11/2 - SARS Cov2 negative. 11/6-blood cultures >> 11/6-urine culture >>   Antimicrobials:  Cefepime 11/6 >>  Vancomycin 11/6 >>   Interim history/subjective:  Rewarmed yesterday at noon. No events overnight. This AM nurse reports best neuro exam is cough/gag with non purposeful movement of head   Objective   Blood pressure 131/64, pulse 82, temperature 99 F (37.2 C), temperature source Esophageal, resp. rate (!) 22, height 6' (1.829 m), weight 82.3 kg, SpO2 100 %. CVP:  [2 mmHg-12 mmHg] 2 mmHg  Vent Mode: PRVC FiO2 (%):  [40 %] 40 % Set Rate:  [20 bmp] 20 bmp Vt Set:  RW:212346 mL] 620 mL  PEEP:  [5 cmH20] 5 cmH20 Plateau Pressure:  [16 cmH20-21 cmH20] 16 cmH20   Intake/Output Summary (Last 24 hours) at 06/12/2019 0843 Last data filed at 06/12/2019 0800 Gross per 24 hour  Intake 3397.84 ml  Output 1670 ml  Net 1727.84 ml   Filed Weights   06/09/19 2048 06/10/19 0500 06/12/19 0402  Weight: 82.2 kg 80.2 kg 82.3 kg    Examination:  General: adult male, chronically ill appearing  Neuro: does not open eyes, does not follow commands, flickers to painful stimulation, +gag/cough, pupils intact  HEENT: ET tube in place, OG/small bore in place  Cardiovascular: RRR, no MRG Lungs: Clear bilaterally, no wheezing/crackles  Abdomen: Nondistended, positive bowel sounds Musculoskeletal: -edema  Skin: intact   Assessment & Plan:   Cardiac arrest: etiology unclear. Suspect multifactorial respiratory failure led to cardiopulmonary arrest -He has struggled with airway protection and has previously suffered cardiac arrest due to mucous plug. This arrest started as a run of NSVT and he then suffered a bradycardic arrest. 12 mins duration. Shock secondary to cardiogenic s/p arrest vs septic with concerns for aspiration >> Resolved  Plan -Cardiac Monitoring  -ECHO pending  -Remains off Pressors, Continue Norvasc, PRN Hydralazine   Acute hypoxemic respiratory failure requiring mechanical ventilation concern for aspiration event with ongoing issues with swallowing and secretion management  OSA non-compliant with CPAP Plan -Vent Support -Neuro Status Barrier to extubation  -May need Tracheostomy for  long-term management  -Empiric cefepime and vancomycin initiated 11/6 given concern for possible aspiration or hospital-acquired infection, follow culture data   Acute encephalopathy. Multifactorial but at risk for hypoxic injury due to arrest -Head CT reassuring, no acute change compared with prior -EEG consistent with encephalopathy without any active seizures Parkinson's disease w/  progressive debilitation  Plan -S/P TTM, RASS goal 0/-1 -D/C Versed gtt, PRN versed added -Titrate Fentanyl gtt to RASS goal  -Continue Rytary per tube -Neurology Consulted, MRI ordered   Dysphagia   Plan -Once medically stable and extubated will need SLP, ?PEG   Myasthenia Gravis. Plan  -continue mestanon.   -Question whether he may require plasma exchange >> defer this until we sort out the details of his neurological recovery, mental status  Hypernatremia Hyperchloremia Hypokalemia  Plan -D/C NS Fluids  -Add Free Water Flushes  -Repeat BMP this afternoon   -Replace electrolytes as indicated >> K replacing now   Best practice:  Diet: TFs Pain/Anxiety/Delirium protocol (if indicated): N/A.  AVOID ANTIPSYCHOTICS due to Parkinson's. VAP protocol (if indicated):Yes DVT prophylaxis: LMWH GI prophylaxis: PPI Glucose control: SSI. Mobility: BR Code Status: full code  Family Communication: Wife, son, daughters updated by 11/8 Disposition: ICU.     Total critical care time: 34 minutes  Critical care time was exclusive of separately billable procedures and treating other patients.  Critical care was necessary to treat or prevent imminent or life-threatening deterioration.  Critical care was time spent personally by me on the following activities: development of treatment plan with patient and/or surrogate as well as nursing, discussions with consultants, evaluation of patient's response to treatment, examination of patient, obtaining history from patient or surrogate, ordering and performing treatments and interventions, ordering and review of laboratory studies, ordering and review of radiographic studies, pulse oximetry and re-evaluation of patient's condition.  Hayden Pedro, AGACNP-BC [facility]  Pgr: [phone]  PCCM Pgr: [phone]

## 2019-06-12 NOTE — Procedures (Addendum)
Patient Name:Derek Harrell [id number] Epilepsy Attending:Elroy Schembri Barbra Sarks Referring Physician/Provider:Rhonda Joya Gaskins, NP Duration:06/11/2019 1111 to 06/12/2019 1159  Patient history:73 year old male status post cardiac arrest on TTM. EEG to evaluate for seizures.  Level of alertness:Comatose/sedated  AEDs during EEG study:Versed  Technical aspects: This EEG study was done with scalp electrodes positioned according to the 10-20 International system of electrode placement. Electrical activity was acquired at a sampling rate of 500Hz  and reviewed with a high frequency filter of 70Hz  and a low frequency filter of 1Hz . EEG data were recorded continuously and digitally stored.  Description:EEG initially showed burst suppression pattern with 2-4 seconds of generalized suppression and 2-5seconds of 2-5hz  theta-delta slowing, at times with triphasic morphology. This gradually evolved into continuous generalized  2-5hz  theta-delta slowing. Maximal bifrontal. There were also generalized epileptiform discharge with triphasic morphology, maximal bifrontal, at 1Hz , mostly when patient was stimulated.EEG was reactive to tactile stimulation. Hyperventilation and photic stimulation were not performed.  Abnormality -Burst suppression, generalized - Continuous slow, generalized - Generalized periodic epileptiform discharges, triphasic morphology  IMPRESSION: This study issuggestive of profound diffuse encephalopathy, nonspecific to etiology.No seizures were seen throughout the recording.  EEG is continuing to improve compared to routine eeg yesterday.  06/10/2019 1341    06/11/2019 0526am     06/11/2019 [place]

## 2019-06-12 NOTE — Plan of Care (Signed)
  Problem: Clinical Measurements: Goal: Ability to maintain clinical measurements within normal limits will improve Outcome: Progressing Goal: Will remain free from infection Outcome: Progressing Goal: Diagnostic test results will improve Outcome: Progressing Goal: Respiratory complications will improve Outcome: Progressing Goal: Cardiovascular complication will be avoided Outcome: Progressing   Problem: Nutrition: Goal: Adequate nutrition will be maintained Outcome: Progressing   Problem: Coping: Goal: Level of anxiety will decrease Outcome: Progressing   Problem: Elimination: Goal: Will not experience complications related to bowel motility Outcome: Progressing Goal: Will not experience complications related to urinary retention Outcome: Progressing   Problem: Pain Managment: Goal: General experience of comfort will improve Outcome: Progressing   Problem: Safety: Goal: Ability to remain free from injury will improve Outcome: Progressing   Problem: Skin Integrity: Goal: Risk for impaired skin integrity will decrease Outcome: Progressing   Problem: Education: Goal: Knowledge of General Education information will improve Description: Including pain rating scale, medication(s)/side effects and non-pharmacologic comfort measures Outcome: Not Progressing   Problem: Health Behavior/Discharge Planning: Goal: Ability to manage health-related needs will improve Outcome: Not Progressing   Problem: Activity: Goal: Risk for activity intolerance will decrease Outcome: Not Progressing   

## 2019-06-13 ENCOUNTER — Inpatient Hospital Stay (HOSPITAL_COMMUNITY): Payer: Medicare Other

## 2019-06-13 DIAGNOSIS — G931 Anoxic brain damage, not elsewhere classified: Secondary | ICD-10-CM | POA: Diagnosis not present

## 2019-06-13 DIAGNOSIS — J9601 Acute respiratory failure with hypoxia: Secondary | ICD-10-CM | POA: Diagnosis not present

## 2019-06-13 DIAGNOSIS — I469 Cardiac arrest, cause unspecified: Secondary | ICD-10-CM | POA: Diagnosis not present

## 2019-06-13 LAB — BASIC METABOLIC PANEL
Anion gap: 6 (ref 5–15)
BUN: 62 mg/dL — ABNORMAL HIGH (ref 8–23)
CO2: 20 mmol/L — ABNORMAL LOW (ref 22–32)
Calcium: 8 mg/dL — ABNORMAL LOW (ref 8.9–10.3)
Chloride: 125 mmol/L — ABNORMAL HIGH (ref 98–111)
Creatinine, Ser: 1.31 mg/dL — ABNORMAL HIGH (ref 0.61–1.24)
GFR calc Af Amer: >60 mL/min (ref >60)
GFR calc non Af Amer: 54 mL/min — ABNORMAL LOW (ref >60)
Glucose, Bld: 130 mg/dL — ABNORMAL HIGH (ref 70–99)
Potassium: 4.3 mmol/L (ref 3.5–5.1)
Sodium: 151 mmol/L — ABNORMAL HIGH (ref 135–145)

## 2019-06-13 LAB — CBC
HCT: 25.1 % — ABNORMAL LOW (ref 39.0–52.0)
Hemoglobin: 8 g/dL — ABNORMAL LOW (ref 13.0–17.0)
MCH: 27.6 pg (ref 26.0–34.0)
MCHC: 31.9 g/dL (ref 30.0–36.0)
MCV: 86.6 fL (ref 80.0–100.0)
Platelets: 120 10*3/uL — ABNORMAL LOW (ref 150–400)
RBC: 2.9 MIL/uL — ABNORMAL LOW (ref 4.22–5.81)
RDW: 14.3 % (ref 11.5–15.5)
WBC: 10.7 10*3/uL — ABNORMAL HIGH (ref 4.0–10.5)
nRBC: 0 % (ref 0.0–0.2)

## 2019-06-13 LAB — VANCOMYCIN, TROUGH: Vancomycin Tr: 26 ug/mL (ref 15–20)

## 2019-06-13 LAB — GLUCOSE, CAPILLARY
Glucose-Capillary: 115 mg/dL — ABNORMAL HIGH (ref 70–99)
Glucose-Capillary: 125 mg/dL — ABNORMAL HIGH (ref 70–99)
Glucose-Capillary: 113 mg/dL — ABNORMAL HIGH (ref 70–99)
Glucose-Capillary: 119 mg/dL — ABNORMAL HIGH (ref 70–99)
Glucose-Capillary: 130 mg/dL — ABNORMAL HIGH (ref 70–99)
Glucose-Capillary: 113 mg/dL — ABNORMAL HIGH (ref 70–99)

## 2019-06-13 LAB — MAGNESIUM: Magnesium: 2.5 mg/dL — ABNORMAL HIGH (ref 1.7–2.4)

## 2019-06-13 LAB — PHOSPHORUS: Phosphorus: 2.6 mg/dL (ref 2.5–4.6)

## 2019-06-13 MED ORDER — PANCRELIPASE (LIP-PROT-AMYL) 10440-39150 UNITS PO TABS
20880.0000 [IU] | ORAL_TABLET | Freq: Once | ORAL | Status: DC
  Filled 2019-06-13: qty 2

## 2019-06-13 MED ORDER — FREE WATER
200.0000 mL | Status: DC
  Administered 2019-06-13 (×4): 200 mL
  Administered 2019-06-14: 300 mL
  Administered 2019-06-14: 200 mL

## 2019-06-13 MED ORDER — SODIUM BICARBONATE 650 MG PO TABS
650.0000 mg | ORAL_TABLET | Freq: Once | ORAL | Status: DC
  Filled 2019-06-13: qty 1

## 2019-06-13 MED ORDER — VANCOMYCIN HCL 10 G IV SOLR: 1500.0000 mg | INTRAVENOUS | Status: DC

## 2019-06-13 MED ORDER — VANCOMYCIN HCL 10 G IV SOLR
1500.0000 mg | INTRAVENOUS | Status: DC
  Administered 2019-06-13: 1500 mg via INTRAVENOUS
  Filled 2019-06-13 (×2): qty 1500

## 2019-06-13 NOTE — Progress Notes (Signed)
NAME:  Derek Harrell, MRN:  [id number], DOB:  1945/10/24, LOS: 7 ADMISSION DATE:  06/06/2019, CONSULTATION DATE:  11/2 REFERRING MD:  Johnney Killian, CHIEF COMPLAINT:  Hypertensive crisis and fever    Brief History   73 year old male w/ h/o parkinson's dementia. admitted 11/2 3 days s/p endovascular infrarenal aortic aneurysm repair w/ worsening weakness, MS change, HTN and fever.   Patient transferred out to the floor 11/5 afternoon. Then 11/6 early AM he suffered cardiac arrest. RN alerted by telemetry to a run of VT. He then became bradycardica and arrested. ACLS done for 12 mins prior to ROSC. He was unresponsive in the post-arrest setting. Transferred to [place] and intubated.   Past Medical History  AAA, prior CVA, HTN, HL, MG, RLS, PD, OSA (did not tolerate CPAP), gait disorder   Significant Hospital Events   11/2-  admitted to ICU 11/3 -  Bradycardic arrest from mucus plugging.  11/6-  PEA Cardiac Arrest, ROSC after 12 minutes >> began TTM to 36 11/7- Re-warming 11/7 completed at noon   Consults:  Vascular surgery called; recommended treating HTN and medical issues. No imaging at this point   Procedures:  ETT 11/3 > 11/4, 11/6 > CVC LIJ 11/06 >> Right Radial Aline 11/06 >>   Significant Diagnostic Tests:  CT head 11/2 - Stable white matter disease.  Micro Data:  11/2 - Blood cultures NGTD 11/2 - SARS Cov2 negative. 11/6-blood cultures >> 11/6-urine culture >>   Antimicrobials:  Cefepime 11/6 >>  Vancomycin 11/6 >>   Interim history/subjective:  Patient remains on arctic sun pads.  Unresponsive on exam with left downward gaze deviation. Remains on fentanyl drip at 25 mcg/hr.    Objective   Blood pressure (!) 113/51, pulse 96, temperature 98.6 F (37 C), resp. rate (!) 26, height 6' (1.829 m), weight 83.2 kg, SpO2 97 %. CVP:  [5 mmHg-19 mmHg] 10 mmHg  Vent Mode: CPAP;PSV FiO2 (%):  [40 %] 40 % Set Rate:  [20 bmp] 20 bmp Vt Set:  [620 mL] 620 mL PEEP:  [5 cmH20] 5 cmH20  Pressure Support:  [5 cmH20] 5 cmH20 Plateau Pressure:  [14 cmH20-20 cmH20] 15 cmH20   Intake/Output Summary (Last 24 hours) at 06/13/2019 1109 Last data filed at 06/13/2019 1100 Gross per 24 hour  Intake 1376.78 ml  Output 2230 ml  Net -853.22 ml   Filed Weights   06/10/19 0500 06/12/19 0402 06/13/19 0405  Weight: 80.2 kg 82.3 kg 83.2 kg    Examination:  General:  Intubated and unresponsive Neuro: Non eye opening, downward left gaze deviation HEENT:  AT/[place] Cardiovascular: Warm and well perfused. Trace edema Lungs: Scattered rhonchi, thick secretions via ETT Abdomen: soft/non tender/non distended Musculoskeletal:  No deformities Skin: Intact  Assessment & Plan:   Acute hypoxemic respiratory failure requiring mechanical ventilation concern for aspiration event with ongoing issues with swallowing and secretion management  OSA non-compliant with CPAP Plan -Empiric cefepime and vancomycin initiated 11/6 given concern for possible aspiration.  Follow up respiratory culture.  MRSA nares pending -Continue full vent support with lung protective strategies, neurological exam and oxygen requirements exclude extubation.   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 Plan  -continue  mestanon.    Hypernatremia Hyperchloremia Plan -Increase free water flushes  Cardiac arrest: etiology unclear. Suspect multifactorial respiratory failure led to cardiopulmonary arrest -He has struggled with airway protection and has previously suffered cardiac arrest due to mucous plug. This arrest started as a run of NSVT  and he then suffered a bradycardic arrest. 12 mins duration. Shock secondary to cardiogenic s/p arrest vs septic with concerns for aspiration >> Resolved  Plan -No acute interventions  Best practice:  Diet: TFs Pain/Anxiety/Delirium protocol (if indicated): N/A.  AVOID ANTIPSYCHOTICS due to Parkinson's. VAP protocol (if indicated):Yes DVT prophylaxis: Heparin subq GI prophylaxis: PPI Glucose control: SSI. Mobility: BR Code Status: full code  Family Communication: Plan to call and update wife after MRI completed Disposition: ICU.     Paulita Fujita, ACNP [facility]  Pager: [phone] After hours pager: [phone]  CCT: 40  min

## 2019-06-13 NOTE — Progress Notes (Signed)
Reason for consult:  Anoxic injury prognosticaton   Subjective: Patient remains intubated on minimal sedation.  Not following any commands or tracking examiner   ROS:  Unable to obtain due to poor mental status  Examination  Vital signs in last 24 hours: Temp:  [98.4 F (36.9 C)-98.8 F (37.1 C)] 98.6 F (37 C) (11/09 0800) Pulse Rate:  [40-98] 94 (11/09 1158) Resp:  [15-26] 24 (11/09 1158) BP: (113-173)/(51-90) 155/82 (11/09 1158) SpO2:  [97 %-100 %] 100 % (11/09 1158) Arterial Line BP: (93-249)/(48-103) 144/53 (11/09 1000) FiO2 (%):  [40 %] 40 % (11/09 1158) Weight:  [83.2 kg] 83.2 kg (11/09 0405)  General: lying in bed CVS: pulse-normal rate and rhythm RS: breathing comfortably Extremities: normal   Neuro: Mental Status: Patient does not respond to verbal stimuli.  Does not respond to deep sternal rub.  Does not follow commands.  No verbalizations noted.  Cranial Nerves: II: Pupils minimally reactive, 5 mm bilaterally III,IV,VI: doll's response present. V,VII: corneal reflex: present bilaterally VIII: patient does not respond to verbal stimuli IX,X: gag reflex + XI: trapezius strength unable to test bilaterally XII: tongue strength unable to test Motor: Withdraws minimally to noxious stimulus in bilateral upper extremities. Sensory: Hello Does not respond to noxious stimuli in any extremity. Plantars: mute Cerebellar: Unable to perform  Basic Metabolic Panel: Recent Labs  Lab 06/08/19 0447 06/09/19 0354  06/10/19 0517  06/10/19 0830  06/11/19 0355 06/11/19 2305 06/12/19 0348 06/12/19 1008 06/12/19 1710 06/13/19 0407  NA  --  145   < > 146*   < > 149*   < > 149* 146* 149* 152* 149* 151*  K  --  3.7   < > 4.6   < > 4.2   < > 3.8 3.6 3.5 4.4 4.1 4.3  CL  --  111  --  113*  --  117*   < > 119* 120* 123*  --  123* 125*  CO2  --  26  --  21*  --  21*   < > 20* 18* 19*  --  20* 20*  GLUCOSE  --  119*  --  197*  --  177*   < > 107* 140* 130*  --  127* 130*   BUN  --  29*  --  31*  --  39*   < > 50* 59* 59*  --  58* 62*  CREATININE  --  0.94  --  1.25*  --  1.28*   < > 1.42* 1.49* 1.47*  --  1.32* 1.31*  CALCIUM  --  8.5*  --  8.3*  --  8.6*   < > 7.9* 7.6* 7.7*  --  8.0* 8.0*  MG 2.4  --   --  2.3  --   --   --  2.3 2.3 2.3  --   --  2.5*  PHOS 2.2* 3.8  --  7.3*  --  4.4  --   --   --   --   --   --  2.6   < > = values in this interval not displayed.    CBC: Recent Labs  Lab 06/09/19 0354  06/10/19 0515 06/10/19 0824 06/11/19 0355 06/12/19 0348 06/12/19 1008 06/13/19 0407  WBC 9.0  --  17.1*  --  13.4* 12.9*  --  10.7*  NEUTROABS  --   --  15.0*  --   --   --   --   --  HGB 12.3*   < > 12.1* 11.6* 10.6* 8.8* 8.8* 8.0*  HCT 37.4*   < > 38.2* 34.0* 33.2* 27.7* 26.0* 25.1*  MCV 82.4  --  85.8  --  83.2 85.0  --  86.6  PLT 180  --  170  --  129* 121*  --  120*   < > = values in this interval not displayed.     Coagulation Studies: No results for input(s): LABPROT, INR in the last 72 hours.  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  The patient is now [place] 3, on minimal sedation.  He is still not tracking/following commands but does appear to have brainstem reflexes intact.  Recommend observation for few more days for improvement as well as MRI brain to assist in prognostication.  Anoxic brain injury/hypoxic ischemic encephalopathy -Continue Mestinon and Sinemet at home doses -MRI brain ordered, pending -Serial neurological examinations, will reassess again on 11/11.  However, if MRI brain shows severe anoxic injury, then will have family discussion regarding goals of care earlier.   I spent 25 minutes in the care of this patient.   Karena Addison Jermiah Soderman [facility] Pager Number [phone] For questions after 7pm please refer to AMION to  reach the Neurologist on call

## 2019-06-13 NOTE — Progress Notes (Signed)
Pharmacy Antibiotic Note  Derek Harrell is a 73 y.o. male admitted on 06/06/2019 with weakness.  Pharmacy has been consulted for Vancomycin/Cefepime dosing for r/o sepsis s/p cardiac arrest with ROSC.   Patient currently afebrile, WBC slightly elevated at 10. Renal fairly stable 1.3, uop of 0.82ml/kg/hr.   Vancomycin peak drawn early last night, resulted at 42. Trough this am was drawn appropriately and was 26. Unable to calculate AUC given peak drawn in such close proximity to dose.   Vancomycin 1500 mg IV Q 24 hrs. Goal AUC 400-550. Expected AUC: [id number] SCr used: 1.3  Vanc 11/6>> Cefepime 11/6>  11/2 Bcx: >> NG 11/6 Resp cx: nf 11/6 bldx2:ngtd 11/6 urine: ng  Plan: Will adjust vancomycin to 1500mg  q24 hours - plan on rechecking levels later this week No change in cefepime dosing  Height: 6' (182.9 cm) Weight: 183 lb 6.8 oz (83.2 kg) IBW/kg (Calculated) : 77.6  Temp (24hrs), Avg:98.6 F (37 C), Min:98.2 F (36.8 C), Max:98.8 F (37.1 C)  Recent Labs  Lab 06/06/19 1147 06/07/19 0241  06/09/19 0354 06/10/19 0515 06/10/19 0517 06/10/19 0830 06/10/19 1043  06/11/19 0355 06/11/19 2305 06/12/19 0348 06/12/19 1710 06/12/19 2300 06/13/19 0407 06/13/19 0900  WBC 11.7*  --    < > 9.0 17.1*  --   --   --   --  13.4*  --  12.9*  --   --  10.7*  --   CREATININE 0.87  --    < > 0.94  --  1.25* 1.28*  --    < > 1.42* 1.49* 1.47* 1.32*  --  1.31*  --   LATICACIDVEN 1.5 1.8  --   --   --  3.8* 1.8 1.4  --   --   --   --   --   --   --   --   VANCOTROUGH  --   --   --   --   --   --   --   --   --   --   --   --   --   --   --  26*  VANCOPEAK  --   --   --   --   --   --   --   --   --   --   --   --   --  42*  --   --    < > = values in this interval not displayed.    Estimated Creatinine Clearance: 56.8 mL/min (A) (by C-G formula based on SCr of 1.31 mg/dL (H)).    No Known Allergies  Erin Hearing PharmD., BCPS Clinical Pharmacist 06/13/2019 12:01 PM

## 2019-06-13 NOTE — Progress Notes (Signed)
PT Cancellation Note  Patient Details Name: Derek Harrell MRN: [id number] DOB: Dec 04, 1945   Cancelled Treatment:    Reason Eval/Treat Not Completed: Medical issues which prohibited therapy;Patient not medically ready s/p cardiac arrest 11/6 and intubated. Not medically ready at this time per RN. Will follow.   Marguarite Arbour A Kaitland Lewellyn 06/13/2019, 9:08 AM Marisa Severin, PT, DPT Acute Rehabilitation Services Pager [phone] Office [phone]

## 2019-06-13 NOTE — Progress Notes (Signed)
Coordinated with MRI staff and respiratory; Planning for MRI at approximately 1400 this afternoon.

## 2019-06-13 NOTE — Progress Notes (Addendum)
[facility]  Subjective  - Intubated and sedated.   Objective (!) 113/51 96 98.6 F (37 C) (!) 26 97%  Intake/Output Summary (Last 24 hours) at 06/13/2019 1103 Last data filed at 06/13/2019 1000 Gross per 24 hour  Intake 1314.3 ml  Output 2190 ml  Net -875.7 ml    Feet warm and well perfused with palpable DP on the left Feet appear well perfuse   Assessment/Planning: S/P EVAR B LE well perfused Respiratory failure  Roxy Horseman 06/13/2019 11:03 AM --  Laboratory Lab Results: Recent Labs    06/12/19 0348 06/12/19 1008 06/13/19 0407  WBC 12.9*  --  10.7*  HGB 8.8* 8.8* 8.0*  HCT 27.7* 26.0* 25.1*  PLT 121*  --  120*   BMET Recent Labs    06/12/19 1710 06/13/19 0407  NA 149* 151*  K 4.1 4.3  CL 123* 125*  CO2 20* 20*  GLUCOSE 127* 130*  BUN 58* 62*  CREATININE 1.32* 1.31*  CALCIUM 8.0* 8.0*    COAG Lab Results  Component Value Date   INR 1.7 (H) 06/10/2019   INR 1.7 (H) 06/10/2019   INR 1.3 (H) 06/02/2019   No results found for: PTT

## 2019-06-13 NOTE — Progress Notes (Signed)
Patient transported to MRI & back on ventilator with no problems.  Ashley Mariner RRT

## 2019-06-14 ENCOUNTER — Inpatient Hospital Stay (HOSPITAL_COMMUNITY): Payer: Medicare Other

## 2019-06-14 DIAGNOSIS — Z7189 Other specified counseling: Secondary | ICD-10-CM | POA: Diagnosis not present

## 2019-06-14 DIAGNOSIS — I469 Cardiac arrest, cause unspecified: Secondary | ICD-10-CM | POA: Diagnosis not present

## 2019-06-14 DIAGNOSIS — R001 Bradycardia, unspecified: Secondary | ICD-10-CM

## 2019-06-14 DIAGNOSIS — R69 Illness, unspecified: Secondary | ICD-10-CM | POA: Diagnosis not present

## 2019-06-14 DIAGNOSIS — G9340 Encephalopathy, unspecified: Secondary | ICD-10-CM | POA: Diagnosis not present

## 2019-06-14 DIAGNOSIS — I161 Hypertensive emergency: Secondary | ICD-10-CM | POA: Diagnosis not present

## 2019-06-14 DIAGNOSIS — J9601 Acute respiratory failure with hypoxia: Secondary | ICD-10-CM | POA: Diagnosis not present

## 2019-06-14 LAB — CBC
HCT: 25.5 % — ABNORMAL LOW (ref 39.0–52.0)
Hemoglobin: 7.9 g/dL — ABNORMAL LOW (ref 13.0–17.0)
MCH: 27.1 pg (ref 26.0–34.0)
MCHC: 31 g/dL (ref 30.0–36.0)
MCV: 87.3 fL (ref 80.0–100.0)
Platelets: 131 10*3/uL — ABNORMAL LOW (ref 150–400)
RBC: 2.92 MIL/uL — ABNORMAL LOW (ref 4.22–5.81)
RDW: 14.6 % (ref 11.5–15.5)
WBC: 10.4 10*3/uL (ref 4.0–10.5)
nRBC: 0 % (ref 0.0–0.2)

## 2019-06-14 LAB — BASIC METABOLIC PANEL
Anion gap: 4 — ABNORMAL LOW (ref 5–15)
BUN: 60 mg/dL — ABNORMAL HIGH (ref 8–23)
CO2: 21 mmol/L — ABNORMAL LOW (ref 22–32)
Calcium: 8.2 mg/dL — ABNORMAL LOW (ref 8.9–10.3)
Chloride: 126 mmol/L — ABNORMAL HIGH (ref 98–111)
Creatinine, Ser: 1.23 mg/dL (ref 0.61–1.24)
GFR calc Af Amer: >60 mL/min (ref >60)
GFR calc non Af Amer: 59 mL/min — ABNORMAL LOW (ref >60)
Glucose, Bld: 121 mg/dL — ABNORMAL HIGH (ref 70–99)
Potassium: 3.9 mmol/L (ref 3.5–5.1)
Sodium: 151 mmol/L — ABNORMAL HIGH (ref 135–145)

## 2019-06-14 LAB — GLUCOSE, CAPILLARY
Glucose-Capillary: 115 mg/dL — ABNORMAL HIGH (ref 70–99)
Glucose-Capillary: 110 mg/dL — ABNORMAL HIGH (ref 70–99)
Glucose-Capillary: 120 mg/dL — ABNORMAL HIGH (ref 70–99)
Glucose-Capillary: 112 mg/dL — ABNORMAL HIGH (ref 70–99)
Glucose-Capillary: 109 mg/dL — ABNORMAL HIGH (ref 70–99)
Glucose-Capillary: 118 mg/dL — ABNORMAL HIGH (ref 70–99)

## 2019-06-14 LAB — PHOSPHORUS: Phosphorus: 1.7 mg/dL — ABNORMAL LOW (ref 2.5–4.6)

## 2019-06-14 LAB — MAGNESIUM: Magnesium: 2.5 mg/dL — ABNORMAL HIGH (ref 1.7–2.4)

## 2019-06-14 MED ORDER — FREE WATER
300.0000 mL | Status: DC
  Administered 2019-06-14 – 2019-06-15 (×6): 300 mL

## 2019-06-14 MED ORDER — POTASSIUM PHOSPHATES 15 MMOLE/5ML IV SOLN
20.0000 mmol | Freq: Once | INTRAVENOUS | Status: AC
  Administered 2019-06-14: 20 mmol via INTRAVENOUS
  Filled 2019-06-14: qty 6.67

## 2019-06-14 MED ORDER — CHLORHEXIDINE GLUCONATE CLOTH 2 % EX PADS
6.0000 | MEDICATED_PAD | Freq: Every day | CUTANEOUS | Status: DC
  Administered 2019-06-14 – 2019-06-25 (×11): 6 via TOPICAL

## 2019-06-14 NOTE — Progress Notes (Addendum)
NEUROLOGY PROGRESS NOTE  Subjective: Currently patient is intubated and is following no commands.   Exam: Vitals:   06/14/19 1118 06/14/19 1134  BP:    Pulse:    Resp:    Temp:    SpO2: 99% 100%    ROS Unable to obtain secondary to patient being intubated and nonresponsive to answering questions   Physical Exam  Constitutional: Appears well-developed and well-nourished.  Eyes: No scleral injection HENT: No OP obstrucion Head: Normocephalic.  Cardiovascular: Normal rate and regular rhythm.  Respiratory: Breathing over the ventilator and nonlabored GI: Soft.  No distension.   Skin: WDI   Neuro:  Mental Status: Patient does not respond to verbal stimuli.  Does not respond to deep sternal rub.  Does not follow commands.  No verbalizations noted.  Intubated breathing over the vent Cranial Nerves: II: patient does not respond confrontation bilaterally,  III,IV,VI: doll's response present bilaterally.  Left gaze preference pupils right 2 mm, left 2 mm,and reactive bilaterally.  Today noted bilateral eyelid twitching along with left gaze preference as noted previously. V,VII: corneal reflex present bilaterally  VIII: patient does not respond to verbal stimuli IX,X: gag reflex present, XI: trapezius strength unable to test bilaterally XII: tongue strength unable to test Motor: Withdraws minimally in bilateral upper extremities Sensory: Does not respond to noxious stimuli in any extremity. Deep Tendon Reflexes:  2+ upper extremities and knee jerk Plantars: equivocal bilaterally Cerebellar: Unable to perform    Medications:  Scheduled: . amLODipine  10 mg Per Tube Daily  . aspirin  81 mg Per Tube Daily  . Carbidopa-Levodopa ER  1 tablet Per Tube 5 X Daily  . chlorhexidine gluconate (MEDLINE KIT)  15 mL Mouth Rinse BID  . Chlorhexidine Gluconate Cloth  6 each Topical Daily  . clopidogrel  75 mg Per Tube Daily  . feeding supplement (PRO-STAT SUGAR FREE 64)  30 mL Per  Tube TID  . free water  300 mL Per Tube Q4H  . heparin  5,000 Units Subcutaneous Q8H  . lipase/protease/amylase)  20,880 Units Per Tube Once   And  . sodium bicarbonate  650 mg Per Tube Once  . mouth rinse  15 mL Mouth Rinse 10 times per [place]isemann  . pantoprazole sodium  40 mg Per Tube Daily  . Pimavanserin Tartrate  34 mg Per Tube Daily  . pyridostigmine  60 mg Per Tube BID  . rosuvastatin  10 mg Per Tube q1800   Continuous: . sodium chloride Stopped (06/10/19 0000)  . feeding supplement (VITAL AF 1.2 CAL) 1,000 mL (06/14/19 0552)  . fentaNYL infusion INTRAVENOUS Stopped (06/13/19 1340)  . lactated ringers Stopped (06/08/19 1106)  . potassium PHOSPHATE IVPB (in mmol) 20 mmol (06/14/19 0933)   ASU:ORVIF/BPPHKFEX arterial line **AND** sodium chloride, fentaNYL, fentaNYL (SUBLIMAZE) injection, hydrALAZINE, midazolam  Pertinent Labs/Diagnostics:   Mr Brain Wo Contrast  Result Date: 06/13/2019 IMPRESSION: Restricted diffusion in the occipital lobes bilaterally and in the frontal parietal lobes over the convexity bilaterally compatible with acute or subacute infarction. Atrophy and extensive chronic ischemic change in the brain. Multiple areas of chronic microhemorrhage which have progressed since 2017. These are likely due to hypertension. Sinus mucosal disease diffusely. Electronically Signed   By: Franchot Gallo M.D.   On: 06/13/2019 15:33   Dr.Aoor did contact radiology and at this point it is believed this is not infarct however it more represents hypoxic ischemic injury  Dg Chest [facility] 1 View  Result Date: 06/13/2019 CLINICAL DATA:  Endotracheal  tube, respiratory failure. EXAM: PORTABLE CHEST 1 VIEW COMPARISON:  June 12, 2019. FINDINGS: Endotracheal, nasogastric and feeding tubes are unchanged in position. Left internal jugular catheter is unchanged. No pneumothorax is noted. Minimal bibasilar subsegmental atelectasis is noted. Bony thorax is unremarkable. No significant pleural effusion  is noted. IMPRESSION: Stable support apparatus. Minimal bibasilar subsegmental atelectasis. Electronically Signed   By: Marijo Conception M.D.   On: 06/13/2019 07:46  Etta Quill PA-C [facility] [phone]  NEUROHOSPITALIST ADDENDUM Performed a face to face diagnostic evaluation.   I have reviewed the contents of history and physical exam as documented by PA/ARNP/Resident and agree with above documentation.  I have discussed and formulated the above plan as documented. Edits to the note have been made as needed.     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  The patient is now post arrest Nesbitt 4.  All sedation has been turned off since yesterday.  On examination patient has intact brainstem reflexes and minimal withdrawal.  MRI was obtained and demonstrates hypoxic ischemic injury secondary to cardiac arrest (formal read says stroke but appears to be more consistent with hypoxic injury and discussed with another neuroradiologist who agrees more consistent with hypoxic injury)   On evaluation today patient does have eyelid twitching and left-sided gaze preference which is worrisome for seizure.  Obtained stat routine EEG which showed generalized slowing with triphasics but no epileptiform activity.   Impression Anoxic brain injury/hypoxic ischemic encephalopathy  Recommendations -Continue supportive care -Goals of care meeting with family, early to prognosticate at this time given intact brainstem reflexes and minimal withdrawal.  Would recommend waiting for few more days to watch for improvement.  Goals of care discussion also needs to be taken into consideration patient's significant medical comorbidities including Parkinson's disease, current cardiac disease.   06/14/2019, 11:50 AM    Karena Addison Aroor MD [facility] [phone]   If 7pm to 7am, please call on call as listed on AMION.

## 2019-06-14 NOTE — Consult Note (Addendum)
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 supportive measures - Do not suspect there will be any intervention at this time. Given current state he is not a candidate for a pacemaker. Can perform external pacing if necessary. Md to see  HCAP/Respiratory failure requiring full vent support - concern for aspiration PNA - IV abx per ID/CCM  Acute encephalopathy with possible hypoxic ischemic injury - EEG showed encephalopathy, no epileptic activity - MRI brain showed hypoxic ischemic injury secondary to cardiac arrest.  - Neurology following>> repeated EEG  Hypertension - Norvasc  Hyperlipidemia - crestor 10 mg daily  For questions or updates, please contact [facility] Please consult [email or web address] for contact info under     Signed, Cadence Ninfa Meeker, PA-C  06/14/2019 1:20 PM   I have personally seen and examined this patient with Cadence Kathlen Mody, PA-C. I agree with the assessment and plan as  outlined above. He is admitted with fever and confusion following aortic aneurysm stent graft repair. Bradycardic arrest in setting of mucus plugging on 11/3 and then VT arrest on 11/6. He has had heart rates in the mid 40s over the past few days but not hypotensive with the bradycardic episodes. He has parkinson's dementia and now with likely acute encephalopathic brain injury.  Labs reviewed Telemetry reviewed: sinus with PACs. Bradycardia earlier today with HR 40s.  Echo reviewed.   My exam:  General: Intubated, not responsive HEENT: OP clear, mucus membranes moist  SKIN: warm, dry. No rashes. Neuro: unable to assess Musculoskeletal: Muscle strength 5/5 all ext  Psychiatric: not responsive Neck: No JVD, no carotid bruits, no thyromegaly, no lymphadenopathy.  Lungs:Clear bilaterally, no wheezes, rhonci, crackles Cardiovascular: Regular rate and rhythm. No murmurs, gallops or rubs. Abdomen:Soft. Bowel sounds present. Non-tender.  Extremities: No lower extremity edema. Pulses are 2 + in the bilateral DP/PT.  Plan: Bradycardia: It is not clear that his bradycardia has driven any episodes of hypotension. The first cardiac arrest was in the setting of a mucus plug and the second arrest was reported to be VT. His LV systolic function is normal. He is in sinus. If he were to have complete neurological recovery and bradycardia leading to hemodynamic compromise, would consider a pacemaker but in the current setting, a pacemaker is not indicated. Cutaneous pads can be placed for backup pacing at this time. He is on no  AV nodal blocking agents. We will follow with you.   Lauree Chandler 06/14/2019 3:39 PM

## 2019-06-14 NOTE — Progress Notes (Signed)
PT Cancellation Note  Patient Details Name: Derek Harrell MRN: [id number] DOB: September 26, 1945   Cancelled Treatment:    Reason Eval/Treat Not Completed: (P) Medical issues which prohibited therapy;Patient not medically ready Pt is currently vented, and without sedation pt is not responding to painful stimulus. PT will follow back tomorrow to check on appropriateness.   Chevez Sambrano B. Migdalia Dk PT, DPT Acute Rehabilitation Services Pager [phone] Office [phone]    [place] 06/14/2019, 9:33 AM

## 2019-06-14 NOTE — Procedures (Addendum)
Patient Name: Derek Harrell  MRN: [id number]  Epilepsy Attending: Lora Havens  Referring Physician/Provider: Etta Quill, PA Date: 06/14/2019 Duration: 27.26 minutes  Patient history: 73 year old male status post cardiac arrest.  EEG to evaluate for seizure.  Level of alertness: Comatose  AEDs during EEG study: None  Technical aspects: This EEG study was done with scalp electrodes positioned according to the 10-20 International system of electrode placement. Electrical activity was acquired at a sampling rate of 500Hz  and reviewed with a high frequency filter of 70Hz  and a low frequency filter of 1Hz . EEG data were recorded continuously and digitally stored.   Description: EEG showed continuous generalized 2 to 5 Hz theta-delta slowing.  Intermittent generalized.  Intermittently, generalized epileptiform discharges with triphasic morphology were seen.  Sharp transients were seen in left temporal region.  Hyperventilation and photic stimulation were not performed.  Abnormality -Continuous slow, generalized -Generalized periodic epileptiform discharges, triphasic morphology.  IMPRESSION: This study is suggestive of profound diffuse encephalopathy, nonspecific to etiology. No seizures were seen throughout the recording.  EEG appears improved compared to previous EEG on 06/12/2019.

## 2019-06-14 NOTE — Progress Notes (Signed)
EEG complete - results pending 

## 2019-06-14 NOTE — Progress Notes (Signed)
OT Cancellation Note  Patient Details Name: Derek Harrell MRN: [id number] DOB: Aug 26, 1945   Cancelled Treatment:    Reason Eval/Treat Not Completed: Other (comment)/ Pt intubated, not sedated, but not following any commands, not opening eyes and not responding to pain. Will continue to follow.  [place], OTR/L Acute Rehab Services Pager [phone] Office [phone]     Almon Register 06/14/2019, 9:25 AM

## 2019-06-14 NOTE — Progress Notes (Addendum)
NAME:  Derek Harrell, MRN:  [id number], DOB:  [birth year], LOS: 8 ADMISSION DATE:  06/06/2019, CONSULTATION DATE:  11/2 REFERRING MD:  Johnney Killian, CHIEF COMPLAINT:  Hypertensive crisis and fever    Brief History   73 year old male w/ h/o parkinson's dementia. admitted 11/2 3 days s/p endovascular infrarenal aortic aneurysm repair w/ worsening weakness, MS change, HTN and fever.   Patient transferred out to the floor 11/5 afternoon. Then 11/6 early AM he suffered cardiac arrest. RN alerted by telemetry to a run of VT. He then became bradycardica and arrested. ACLS done for 12 mins prior to ROSC. He was unresponsive in the post-arrest setting. Transferred to [facility] and intubated.   Past Medical History  AAA, prior CVA, HTN, HL, MG, RLS, PD, OSA (did not tolerate CPAP), gait disorder   Significant Hospital Events   11/2 >>  admitted to ICU 11/3  >>Bradycardic arrest from mucus plugging.  11/6 >> PEA Cardiac Arrest, ROSC after 12 minutes >> began TTM to 36 11/7 >> Re-warming 11/7 completed at noon   Consults:  Vascular surgery called; recommended treating HTN and medical issues. No imaging at this point   Procedures:  ETT 11/3 >> 11/4, 11/6 >> CVC LIJ 11/06 >> Right Radial Aline 11/06 >>   Significant Diagnostic Tests:  CT head 11/2 >> Stable white matter disease.  MRI brain 11/9 >> Restricted diffusion in the occipital lobes bilaterally and in the frontal parietal lobes over the convexity bilaterally compatible with acute or subacute infarction. Atrophy and extensive chronic ischemic change in the brain. Multiple areas of chronic microhemorrhage which have progressed since 2017.   Micro Data:  Blood cultures 11/2>> negative   SARS Cov2 11/2 >> negative. Blood cultures 11/6 >> Urine culture 11/6 >> negative Tracheal aspirate 11/6 >> negative  Antimicrobials:  Cefepime 11/6 >>  Vancomycin 11/6 >>   Interim history/subjective:  RN reports intermittent bradycardia overnight, remains  unresponsive on exam with no continuous sedation.   Objective   Blood pressure (!) 159/74, pulse 67, temperature 99.2 F (37.3 C), temperature source Axillary, resp. rate 12, height 6' (1.829 m), weight 84.8 kg, SpO2 100 %. CVP:  [9 mmHg-19 mmHg] 9 mmHg  Vent Mode: PRVC FiO2 (%):  [30 %-40 %] 30 % Set Rate:  [20 bmp] 20 bmp Vt Set:  [620 mL] 620 mL PEEP:  [5 cmH20] 5 cmH20 Pressure Support:  [5 cmH20] 5 cmH20 Plateau Pressure:  [11 cmH20-20 cmH20] 20 cmH20   Intake/Output Summary (Last 24 hours) at 06/14/2019 0755 Last data filed at 06/14/2019 0600 Gross per 24 hour  Intake 1498.73 ml  Output 3015 ml  Net -1516.27 ml   Filed Weights   06/12/19 0402 06/13/19 0405 06/14/19 0345  Weight: 82.3 kg 83.2 kg 84.8 kg    Examination:  General: Chronically ill appearing elderly male on mechanical ventilation, in NAD HEENT: ETT, MM pink/moist, PERRL, nystagmus  Neuro: gap reflex induced with tracheal suction, no purposeful movement,  CV: s1s2 regular rate and rhythm, no murmur, rubs, or gallops,  PULM:  Clear top ascultation bilaterally, no increased work of breathing no added breath sounds  GI: soft, bowel sounds active in all 4 quadrants, non-tender, non-distended, tolerating TF Extremities: warm/dry, no edema  Skin: no rashes or lesions  Assessment & Plan:   Acute hypoxemic respiratory failure requiring mechanical ventilation concern for aspiration event with ongoing issues with swallowing and secretion management  OSA non-compliant with CPAP P: Continue ventilator support with lung protective strategies  Wean PEEP and FiO2 for sats greater than 92%. Head of bed elevated 30 degrees. Plateau pressures less than 30 cm H20.  Follow intermittent chest x-ray and ABG.   SAT/SBT as tolerated, mentation preclude extubation  Ensure adequate pulmonary hygiene  Follow cultures  VAP bundle in place  Continue empiric antibiotics given concern for aspiration  Cardiac arrest: etiology  unclear, s/p TTM Intermittent bradycardia  -Suspect multifactorial respiratory failure led to cardiopulmonary arrest -He has struggled with airway protection and has previously suffered cardiac arrest due to mucous plug. This arrest started as a run of NSVT and he then suffered a bradycardic arrest. 12 mins duration. 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 P: Continue Mestanon  Hypernatremia Hyperchloremia P: Increase free water flushes further today   Best practice:  Diet: TFs Pain/Anxiety/Delirium protocol (if indicated): N/A.  AVOID ANTIPSYCHOTICS due to Parkinson's. VAP protocol (if indicated):Yes DVT prophylaxis: Heparin subq GI prophylaxis: PPI Glucose control: SSI. Mobility: BR Code Status: full code  Family Communication: Will update family today  Disposition: ICU.    Critical care:   Performed by: Johnsie Cancel  Total critical care time: 35 minutes  Critical care time was exclusive of separately billable procedures and treating other patients.  Critical care was necessary to treat or prevent imminent or life-threatening deterioration.  Critical care was time spent personally by me on the following activities: development of treatment plan with patient and/or surrogate as well as nursing, discussions with consultants, evaluation of patient's response to treatment, examination of patient, obtaining history from patient or  surrogate, ordering and performing treatments and interventions, ordering and review of laboratory studies, ordering and review of radiographic studies, pulse oximetry and re-evaluation of patient's condition.  Signature:   Johnsie Cancel, NP-C [facility] After hours pager: [phone]. 06/14/2019, 8:23 AM

## 2019-06-14 NOTE — Progress Notes (Signed)
Cortrak Tube Team Note:  RN notified Cortrak team that tube is clogged, will not flush.   RD successfully unclogged Cortrak tube and tube was flushed multiple times without resistance. Cortrak is ok for use.  If the tube becomes dislodged please keep the tube and contact the Cortrak team at [email or web address] ([email or web address]) for replacement.  If after hours and replacement cannot be delayed, place a NG tube and confirm placement with an abdominal x-ray.    [employer] MS, RDN, LDN, CNSC [phone] Pager  [phone] Weekend/On-Call Pager

## 2019-06-14 NOTE — Progress Notes (Signed)
RT note-Patient with second episode of bradycardia, placed back to full support.

## 2019-06-15 DIAGNOSIS — G20 Parkinson's disease: Secondary | ICD-10-CM | POA: Diagnosis not present

## 2019-06-15 DIAGNOSIS — I469 Cardiac arrest, cause unspecified: Secondary | ICD-10-CM | POA: Diagnosis not present

## 2019-06-15 DIAGNOSIS — G9340 Encephalopathy, unspecified: Secondary | ICD-10-CM | POA: Diagnosis not present

## 2019-06-15 DIAGNOSIS — J9601 Acute respiratory failure with hypoxia: Secondary | ICD-10-CM | POA: Diagnosis not present

## 2019-06-15 LAB — CBC
HCT: 25.9 % — ABNORMAL LOW (ref 39.0–52.0)
Hemoglobin: 8.1 g/dL — ABNORMAL LOW (ref 13.0–17.0)
MCH: 27.2 pg (ref 26.0–34.0)
MCHC: 31.3 g/dL (ref 30.0–36.0)
MCV: 86.9 fL (ref 80.0–100.0)
Platelets: 146 10*3/uL — ABNORMAL LOW (ref 150–400)
RBC: 2.98 MIL/uL — ABNORMAL LOW (ref 4.22–5.81)
RDW: 14.5 % (ref 11.5–15.5)
WBC: 10.3 10*3/uL (ref 4.0–10.5)
nRBC: 0 % (ref 0.0–0.2)

## 2019-06-15 LAB — BASIC METABOLIC PANEL
Anion gap: 7 (ref 5–15)
BUN: 54 mg/dL — ABNORMAL HIGH (ref 8–23)
CO2: 21 mmol/L — ABNORMAL LOW (ref 22–32)
Calcium: 8.3 mg/dL — ABNORMAL LOW (ref 8.9–10.3)
Chloride: 123 mmol/L — ABNORMAL HIGH (ref 98–111)
Creatinine, Ser: 1.09 mg/dL (ref 0.61–1.24)
GFR calc Af Amer: >60 mL/min (ref >60)
GFR calc non Af Amer: >60 mL/min (ref >60)
Glucose, Bld: 110 mg/dL — ABNORMAL HIGH (ref 70–99)
Potassium: 3.9 mmol/L (ref 3.5–5.1)
Sodium: 151 mmol/L — ABNORMAL HIGH (ref 135–145)

## 2019-06-15 LAB — GLUCOSE, CAPILLARY
Glucose-Capillary: 93 mg/dL (ref 70–99)
Glucose-Capillary: 98 mg/dL (ref 70–99)
Glucose-Capillary: 97 mg/dL (ref 70–99)
Glucose-Capillary: 95 mg/dL (ref 70–99)

## 2019-06-15 LAB — CULTURE, BLOOD (ROUTINE X 2)
Culture: NO GROWTH
Culture: NO GROWTH
Special Requests: ADEQUATE

## 2019-06-15 MED ORDER — DOCUSATE SODIUM 50 MG/5ML PO LIQD
50.0000 mg | Freq: Every day | ORAL | Status: DC
  Administered 2019-06-15 – 2019-07-25 (×39): 50 mg
  Filled 2019-06-15 (×39): qty 10

## 2019-06-15 MED ORDER — NOREPINEPHRINE 4 MG/250ML-% IV SOLN
INTRAVENOUS | Status: AC
  Filled 2019-06-15: qty 250

## 2019-06-15 MED ORDER — POTASSIUM CHLORIDE 20 MEQ/15ML (10%) PO SOLN
20.0000 meq | Freq: Once | ORAL | Status: AC
  Administered 2019-06-15: 20 meq via ORAL
  Filled 2019-06-15: qty 15

## 2019-06-15 MED ORDER — FREE WATER
400.0000 mL | Status: DC
  Administered 2019-06-15 – 2019-06-20 (×31): 400 mL

## 2019-06-15 MED ORDER — PANCRELIPASE (LIP-PROT-AMYL) 10440-39150 UNITS PO TABS
20880.0000 [IU] | ORAL_TABLET | Freq: Once | ORAL | Status: AC
  Administered 2019-06-15: 23:00:00 20880 [IU]
  Filled 2019-06-15: qty 2

## 2019-06-15 MED ORDER — SODIUM BICARBONATE 650 MG PO TABS
650.0000 mg | ORAL_TABLET | Freq: Once | ORAL | Status: AC
  Administered 2019-06-15: 650 mg
  Filled 2019-06-15: qty 1

## 2019-06-15 NOTE — Progress Notes (Signed)
OT Cancellation Note  Patient Details Name: Derek Harrell MRN: [id number] DOB: 1945-12-01   Cancelled Treatment:    Reason Eval/Treat Not Completed: Patient not medically ready Pt remains on the vent with minimal sedation, and no response to stimulil. OT will sign off for now, please reorder when pt medically ready and responding to treatment.   Gus Rankin, OT Student  Gus Rankin 06/15/2019, 12:39 PM

## 2019-06-15 NOTE — Progress Notes (Addendum)
  Progress Note    06/15/2019 9:17 AM  Subjective:  Intubated and sedated   Vitals:   06/15/19 0700 06/15/19 0800  BP: (!) 159/76 137/74  Pulse: (!) 41 63  Resp: 19 20  Temp:  99.4 F (37.4 C)  SpO2: 100% 100%   Physical Exam: Lungs:  Mechanical ventilation Incisions:  Groin cath sites well healed without hematoma Extremities:  Palpable DP pulses bilaterally Abdomen:  soft Neurologic: sedated  CBC    Component Value Date/Time   WBC 10.3 06/15/2019 0428   RBC 2.98 (L) 06/15/2019 0428   HGB 8.1 (L) 06/15/2019 0428   HGB 14.8 01/07/2016 0950   HCT 25.9 (L) 06/15/2019 0428   HCT 43.5 01/07/2016 0950   PLT 146 (L) 06/15/2019 0428   PLT 179 01/07/2016 0950   MCV 86.9 06/15/2019 0428   MCV 80 01/07/2016 0950   MCH 27.2 06/15/2019 0428   MCHC 31.3 06/15/2019 0428   RDW 14.5 06/15/2019 0428   RDW 13.8 01/07/2016 0950   LYMPHSABS 0.8 06/10/2019 0515   LYMPHSABS 1.5 01/07/2016 0950   MONOABS 0.8 06/10/2019 0515   EOSABS 0.2 06/10/2019 0515   EOSABS 0.3 01/07/2016 0950   BASOSABS 0.1 06/10/2019 0515   BASOSABS 0.1 01/07/2016 0950    BMET    Component Value Date/Time   NA 151 (H) 06/15/2019 0428   NA 142 01/07/2016 0950   K 3.9 06/15/2019 0428   CL 123 (H) 06/15/2019 0428   CO2 21 (L) 06/15/2019 0428   GLUCOSE 110 (H) 06/15/2019 0428   BUN 54 (H) 06/15/2019 0428   BUN 9 01/07/2016 0950   CREATININE 1.09 06/15/2019 0428   CREATININE 0.82 10/26/2017 0918   CALCIUM 8.3 (L) 06/15/2019 0428   GFRNONAA >60 06/15/2019 0428   GFRAA >60 06/15/2019 0428    INR    Component Value Date/Time   INR 1.7 (H) 06/10/2019 1345     Intake/Output Summary (Last 24 hours) at 06/15/2019 0917 Last data filed at 06/15/2019 0810 Gross per 24 hour  Intake 3825.85 ml  Output 2655 ml  Net 1170.85 ml     Assessment/Plan:  73 y.o. male sedated on vent; EVAR on 06/02/19  Palpable DP pulses bilaterally; groin incisions healed Nothing to add from vascular standpoint    Dagoberto Ligas, PA-C [facility] [phone] 06/15/2019 9:17 AM

## 2019-06-15 NOTE — Progress Notes (Signed)
eLink Physician-Brief Progress Note Patient Name: Derek Harrell DOB: [birth year] MRN: [id number]   Date of Service  06/15/2019  HPI/Events of Note  Pt needs enteral feeding tube declogged  eICU Interventions  Unclogging order set ordered.        Kerry Kass Ogan 06/15/2019, 10:29 PM

## 2019-06-15 NOTE — Progress Notes (Signed)
PT Cancellation Note  Patient Details Name: Derek Harrell MRN: [id number] DOB: 1946/07/15   Cancelled Treatment:    Reason Eval/Treat Not Completed: (P) Medical issues which prohibited therapy;Patient's level of consciousness Pt continues to be vented with minimal sedation, with no response to stimuli. PT will sign off for now. Please reorder when pt will respond to therapy treatment.   Sherod Cisse B. Migdalia Dk PT, DPT Acute Rehabilitation Services Pager [phone] Office [phone]    [place] 06/15/2019, 8:41 AM

## 2019-06-15 NOTE — Progress Notes (Addendum)
NAME:  Derek Harrell, MRN:  [id number], DOB:  October 15, 1945, LOS: 9 ADMISSION DATE:  06/06/2019, CONSULTATION DATE:  11/2 REFERRING MD:  Johnney Killian, CHIEF COMPLAINT:  Hypertensive crisis and fever    Brief History   73 year old male w/ h/o parkinson's dementia. admitted 11/2 3 days s/p endovascular infrarenal aortic aneurysm repair w/ worsening weakness, MS change, HTN and fever.   Patient transferred out to the floor 11/5 afternoon. Then 11/6 early AM he suffered cardiac arrest. RN alerted by telemetry to a run of VT. He then became bradycardica and arrested. ACLS done for 12 mins prior to ROSC. He was unresponsive in the post-arrest setting. Transferred to [facility] and intubated.   Past Medical History  AAA, prior CVA, HTN, HL, MG, RLS, PD, OSA (did not tolerate CPAP), gait disorder   Significant Hospital Events   11/2 >>  admitted to ICU 11/3  >>Bradycardic arrest from mucus plugging.  11/6 >> PEA Cardiac Arrest, ROSC after 12 minutes >> began TTM to 36 11/7 >> Re-warming 11/7 completed at noon   Consults:  Vascular surgery called; recommended treating HTN and medical issues. No imaging at this point   Procedures:  ETT 11/3 >> 11/4, 11/6 >> CVC LIJ 11/06 >> Right Radial Aline 11/06 >>   Significant Diagnostic Tests:  CT head 11/2 >> Stable white matter disease.  MRI brain 11/9 >> Restricted diffusion in the occipital lobes bilaterally and in the frontal parietal lobes over the convexity bilaterally compatible with acute or subacute infarction. Atrophy and extensive chronic ischemic change in the brain. Multiple areas of chronic microhemorrhage which have progressed since 2017.  Micro Data:  Blood cultures 11/2>> negative   SARS Cov2 11/2 >> negative. Blood cultures 11/6 >> negative  Urine culture 11/6 >> negative Tracheal aspirate 11/6 >> negative  Antimicrobials:  Cefepime 11/6 >> 11/10 Vancomycin 11/6 >> 11/10  Interim history/subjective:  Reported episodes of intermittent  bradycardia with pauses and bigeminy afternoon of 11/10, no cardiac arrhythmias reported overnight. Remains minimally responsive on exam today   Objective   Blood pressure 137/74, pulse 63, temperature 98.9 F (37.2 C), temperature source Oral, resp. rate 20, height 6' (1.829 m), weight 86 kg, SpO2 100 %.    Vent Mode: PRVC FiO2 (%):  [30 %] 30 % Set Rate:  [20 bmp] 20 bmp Vt Set:  [620 mL] 620 mL PEEP:  [5 cmH20] 5 cmH20 Pressure Support:  [5 cmH20-8 cmH20] 5 cmH20 Plateau Pressure:  [14 cmH20-16 cmH20] 14 cmH20   Intake/Output Summary (Last 24 hours) at 06/15/2019 0811 Last data filed at 06/15/2019 0700 Gross per 24 hour  Intake 3470.85 ml  Output 2255 ml  Net 1215.85 ml   Filed Weights   06/13/19 0405 06/14/19 0345 06/15/19 0500  Weight: 83.2 kg 84.8 kg 86 kg    Examination:  General: Chronically ill appearing elderly maleon mechanical ventilation, in NAD HEENT: ETT, MM pink/moist, PERRL, nystagmus  Neuro: remains minimally responsive, gap reflux intact CV: s1s2 regular rate and rhythm, no murmur, rubs, or gallops,  PULM:  Clear to ascultation bilaterally, no added breath sounds GI: soft, bowel sounds active in all 4 quadrants, non-tender, non-distended, tolerating TF Extremities: warm/dry, no edema  Skin: no rashes or lesions  Assessment & Plan:   Acute hypoxemic respiratory failure requiring mechanical ventilation concern for aspiration event with ongoing issues with swallowing and secretion management  OSA non-compliant with CPAP P: Continue ventilator support with lung protective strategies   Wean PEEP and FiO2 for  sats greater than 90%. Head of bed elevated 30 degrees. Plateau pressures less than 30 cm H20.  Follow intermittent chest x-ray and ABG.   SAT/SBT as tolerated, mentation preclude extubation  Ensure adequate pulmonary hygiene  Follow cultures  VAP bundle in place   Cardiac arrest: etiology unclear, s/p TTM Intermittent bradycardia  -Suspect  multifactorial respiratory failure led to cardiopulmonary arrest -He has struggled with airway protection and has previously suffered cardiac arrest due to mucous plug. This arrest started as a run of NSVT and he then suffered a bradycardic arrest. 12 mins duration. 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  Parkinson's disease w/ progressive debilitation  -EEG x2 consistent with encephalopathy without any active seizures -Per neurology unable to determine full extent of damage from anoxic injury, will allow more time to assess mentation prior to full determination  -MRI brain consistent with anoxic injury  P: Neurology following, appreciate assistance  Continue to minimize sedation Continue home carbidopa-levodopa   Hypertension P: Continue Norvasc  Avoid AV nodal blockers   Dysphagia   P SLP once extubated and medically stable  If aggressive measures desired by family may need to consider PEG in the near future   Myasthenia Gravis. P: Continue Mestanon   Hypernatremia Hyperchloremia P: Will again increase free water flushes  Need for close monitoring of fluid status given large free water amount  As of 11/11 pt has a free water deficit of 957ml  Best practice:  Diet: TFs Pain/Anxiety/Delirium protocol (if indicated): N/A.  AVOID ANTIPSYCHOTICS due to Parkinson's. VAP protocol (if indicated):Yes DVT prophylaxis: Heparin subq GI prophylaxis: PPI Glucose control: SSI. Mobility: BR Code Status: full code  Family Communication: Will update family today  Disposition: ICU.    Critical care:   Performed by: Johnsie Cancel  Total critical care time: 40 minutes  Critical care time was exclusive of separately billable procedures and treating other patients.  Critical care was necessary to treat or prevent imminent or life-threatening  deterioration.  Critical care was time spent personally by me on the following activities: development of treatment plan with patient and/or surrogate as well as nursing, discussions with consultants, evaluation of patient's response to treatment, examination of patient, obtaining history from patient or surrogate, ordering and performing treatments and interventions, ordering and review of laboratory studies, ordering and review of radiographic studies, pulse oximetry and re-evaluation of patient's condition.  Signature:   Johnsie Cancel, NP-C [facility] After hours pager: [phone]. 06/15/2019, 8:11 AM

## 2019-06-16 DIAGNOSIS — G9340 Encephalopathy, unspecified: Secondary | ICD-10-CM | POA: Diagnosis not present

## 2019-06-16 DIAGNOSIS — I469 Cardiac arrest, cause unspecified: Secondary | ICD-10-CM | POA: Diagnosis not present

## 2019-06-16 DIAGNOSIS — G931 Anoxic brain damage, not elsewhere classified: Secondary | ICD-10-CM | POA: Diagnosis not present

## 2019-06-16 DIAGNOSIS — J9601 Acute respiratory failure with hypoxia: Secondary | ICD-10-CM | POA: Diagnosis not present

## 2019-06-16 LAB — BASIC METABOLIC PANEL
Anion gap: 6 (ref 5–15)
Anion gap: 8 (ref 5–15)
BUN: 50 mg/dL — ABNORMAL HIGH (ref 8–23)
BUN: 51 mg/dL — ABNORMAL HIGH (ref 8–23)
CO2: 23 mmol/L (ref 22–32)
CO2: 22 mmol/L (ref 22–32)
Calcium: 8.4 mg/dL — ABNORMAL LOW (ref 8.9–10.3)
Calcium: 7.9 mg/dL — ABNORMAL LOW (ref 8.9–10.3)
Chloride: 121 mmol/L — ABNORMAL HIGH (ref 98–111)
Chloride: 116 mmol/L — ABNORMAL HIGH (ref 98–111)
Creatinine, Ser: 1.1 mg/dL (ref 0.61–1.24)
Creatinine, Ser: 0.99 mg/dL (ref 0.61–1.24)
GFR calc Af Amer: >60 mL/min (ref >60)
GFR calc Af Amer: >60 mL/min (ref >60)
GFR calc non Af Amer: >60 mL/min (ref >60)
GFR calc non Af Amer: >60 mL/min (ref >60)
Glucose, Bld: 124 mg/dL — ABNORMAL HIGH (ref 70–99)
Glucose, Bld: 113 mg/dL — ABNORMAL HIGH (ref 70–99)
Potassium: 3.9 mmol/L (ref 3.5–5.1)
Potassium: 3.7 mmol/L (ref 3.5–5.1)
Sodium: 150 mmol/L — ABNORMAL HIGH (ref 135–145)
Sodium: 146 mmol/L — ABNORMAL HIGH (ref 135–145)

## 2019-06-16 LAB — GLUCOSE, CAPILLARY
Glucose-Capillary: 100 mg/dL — ABNORMAL HIGH (ref 70–99)
Glucose-Capillary: 115 mg/dL — ABNORMAL HIGH (ref 70–99)
Glucose-Capillary: 115 mg/dL — ABNORMAL HIGH (ref 70–99)

## 2019-06-16 LAB — CBC
HCT: 25.8 % — ABNORMAL LOW (ref 39.0–52.0)
Hemoglobin: 8.3 g/dL — ABNORMAL LOW (ref 13.0–17.0)
MCH: 27.3 pg (ref 26.0–34.0)
MCHC: 32.2 g/dL (ref 30.0–36.0)
MCV: 84.9 fL (ref 80.0–100.0)
Platelets: 151 10*3/uL (ref 150–400)
RBC: 3.04 MIL/uL — ABNORMAL LOW (ref 4.22–5.81)
RDW: 14.2 % (ref 11.5–15.5)
WBC: 10 10*3/uL (ref 4.0–10.5)
nRBC: 0 % (ref 0.0–0.2)

## 2019-06-16 LAB — PHOSPHORUS: Phosphorus: 3.4 mg/dL (ref 2.5–4.6)

## 2019-06-16 LAB — MAGNESIUM: Magnesium: 2.1 mg/dL (ref 1.7–2.4)

## 2019-06-16 MED ORDER — MIDAZOLAM HCL 2 MG/2ML IJ SOLN: 2.0000 mg | INTRAMUSCULAR | Status: DC | PRN

## 2019-06-16 MED ORDER — SODIUM CHLORIDE 0.9 % IV SOLN
INTRAVENOUS | Status: DC | PRN
  Administered 2019-06-16 – 2019-06-20 (×3): 250 mL via INTRAVENOUS

## 2019-06-16 NOTE — Progress Notes (Signed)
eLink Physician-Brief Progress Note Patient Name: Derek Harrell DOB: 11-21-1945 MRN: [id number]   Date of Service  06/16/2019  HPI/Events of Note  13 beat run of wide complex tachycardia, patient has not had recent labs.  eICU Interventions  BMET, phosphorus, Mg, stat.        Kerry Kass Tavio Biegel 06/16/2019, 9:15 PM

## 2019-06-16 NOTE — Progress Notes (Signed)
Nutrition Follow-up  RD working remotely.  DOCUMENTATION CODES:   Not applicable  INTERVENTION:   - Plan for Cortrak team to evaluate clogged Cortrak tomorrow and attempt to unclog vs replace  Continue tube feeds via OG: - Vital AF 1.2 @ 55 ml/hr (1320 ml/Hilleary) - Pro-stat 30 ml TID - Free water per CCM, currently 400 ml q 4 hours  Tube feeding regimen and current free water provides 1884 kcal, 144 grams of protein, and 3469 ml of H2O.   NUTRITION DIAGNOSIS:   Inadequate oral intake related to inability to eat as evidenced by NPO status.  Ongoing, being addressed via TF  GOAL:   Patient will meet greater than or equal to 90% of their needs  Met via TF  MONITOR:   Vent status, Labs, Weight trends, TF tolerance, Skin, I & O's, Other (GOC)  REASON FOR ASSESSMENT:   Ventilator, Consult Enteral/tube feeding initiation and management  ASSESSMENT:   73 yo male admitted with worsening weakness, MS change, HTN, fever. PMH includes HTN, AAA, HLD, CVD, obesity, stroke, myasthenia gravis, RLS, Parkinson's disease, dementia.  11/02 - admit 11/03 - Cortrak placed, tip gastric per Cortrak team 11/04 - extubated 11/06 - cardiac arrest, reintubated, TTM 36 11/10 - Cortrak unclogged  Per RN, Cortrak is clogged for a second time. Protocol to unclog was not successful. Okay to run TF via OG tube per MD. Janyce Llanos consult placed and Cortrak team to evaluation tomorrow, 11/13, for ability to unclog vs need to replace Cortrak.  Per Neurology, "MRI brain does show bihemispheric restriction diffusion consistent with hypoxic ischemic encephalopathy."  Per CCM, "underlying neuromuscular disease is a block to any attempted extubation. Suspect he will require tracheostomy or goals of care discussion with comfort being goal." Wife reviewing options with pt's children. CCM considering whether pt will require PEG long-term if aggressive treatment is continued.  Palliative care team consult has  been recommended.  Weight up 4 lbs compared to admit weight. Suspect weight gain related to positive fluid balance. Will continue to monitor trends. EDW: 80.2 kg.  Per RN edema assessment, pt with mild pitting edema to BUE and BLE as well as generalized mild pitting edema.  Current TF: Vital AF 1.2 @ 55 ml/hr, Pro-stat 30 ml TID, free water 400 ml q 4 hours  Patient is currently intubated on ventilator support MV: 11.7 L/min Temp (24hrs), Avg:98.6 F (37 C), Min:98 F (36.7 C), Max:99.2 F (37.3 C) BP (cuff): 156/73 MAP (cuff): 91  Drips: NS: 10 ml/hr  Medications reviewed and include: Colace, Protonix  Labs reviewed: sodium 150, chloride 121, BUN 50, hemoglobin 8.3 CBG's: 95-115 x 24 hours  UOP: 3350 ml x 24 hours I/O's: +2.9 L since admit  Diet Order:   Diet Order            Diet NPO time specified  Diet effective now              EDUCATION NEEDS:   No education needs have been identified at this time  Skin:  Skin Assessment: Skin Integrity Issues: Skin Integrity Issues: Stage II: buttocks Incisions: groin  Last BM:  06/15/19 type 6  Height:   Ht Readings from Last 1 Encounters:  06/06/19 6' (1.829 m)    Weight:   Wt Readings from Last 1 Encounters:  06/16/19 84.6 kg    Ideal Body Weight:  80.9 kg  BMI:  Body mass index is 25.3 kg/m.  Estimated Nutritional Needs:   Kcal:  1866  Protein:  120-160  g  Fluid:  >/= 2 L    Gaynell Face, MS, RD, LDN Inpatient Clinical Dietitian Pager: [phone] Weekend/After Hours: [phone]

## 2019-06-16 NOTE — Progress Notes (Signed)
Reason for consult:   Subjective: No significant change in my exam.  Apparently yesterday Dr. Loanne Drilling a CCM felt he was following simple commands such as sticking his tongue out consistently as well as tracking his wife which is definitely an improvement.  MRI brain does show bihemispheric restriction diffusion consistent with hypoxic ischemic encephalopathy (rather than subacute infarcts, report has been addended by radiologist).    ROS: Unable to obtain due to poor mental status  Examination  Vital signs in last 24 hours: Temp:  [98 F (36.7 C)-99.2 F (37.3 C)] 98.7 F (37.1 C) (11/12 0818) Pulse Rate:  [62-75] 67 (11/12 0600) Resp:  [15-22] 20 (11/12 0600) BP: (117-173)/(68-86) 149/82 (11/12 0600) SpO2:  [100 %] 100 % (11/12 0600) FiO2 (%):  [30 %] 30 % (11/12 0735) Weight:  [84.6 kg] 84.6 kg (11/12 0321)  General: lying in bed** CVS: pulse-normal rate and rhythm RS: breathing comfortably Extremities: normal   Neuro: MS: Not following any commands CN: pupils equal and reactive, no forced gaze deviation see, eyes have roving movements but does not track examiner, corneal and gag reflexes intact Motor: Withdraws minimally in the left upper extremity, could not elicit withdrawal in the right upper extremity Reflexes: 2+ bilaterally over patella, biceps, Gait: not tested  Basic Metabolic Panel: Recent Labs  Lab 06/10/19 0517  06/10/19 0830  06/11/19 0355 06/11/19 2305 06/12/19 0348  06/12/19 1710 06/13/19 0407 06/14/19 0333 06/15/19 0428 06/16/19 0322  NA 146*   < > 149*   < > 149* 146* 149*   < > 149* 151* 151* 151* 150*  K 4.6   < > 4.2   < > 3.8 3.6 3.5   < > 4.1 4.3 3.9 3.9 3.9  CL 113*  --  117*   < > 119* 120* 123*  --  123* 125* 126* 123* 121*  CO2 21*  --  21*   < > 20* 18* 19*  --  20* 20* 21* 21* 23  GLUCOSE 197*  --  177*   < > 107* 140* 130*  --  127* 130* 121* 110* 124*  BUN 31*  --  39*   < > 50* 59* 59*  --  58* 62* 60* 54* 50*  CREATININE 1.25*   --  1.28*   < > 1.42* 1.49* 1.47*  --  1.32* 1.31* 1.23 1.09 1.10  CALCIUM 8.3*  --  8.6*   < > 7.9* 7.6* 7.7*  --  8.0* 8.0* 8.2* 8.3* 8.4*  MG 2.3  --   --   --  2.3 2.3 2.3  --   --  2.5* 2.5*  --   --   PHOS 7.3*  --  4.4  --   --   --   --   --   --  2.6 1.7*  --   --    < > = values in this interval not displayed.    CBC: Recent Labs  Lab 06/10/19 0515  06/12/19 0348 06/12/19 1008 06/13/19 0407 06/14/19 0333 06/15/19 0428 06/16/19 0322  WBC 17.1*   < > 12.9*  --  10.7* 10.4 10.3 10.0  NEUTROABS 15.0*  --   --   --   --   --   --   --   HGB 12.1*   < > 8.8* 8.8* 8.0* 7.9* 8.1* 8.3*  HCT 38.2*   < > 27.7* 26.0* 25.1* 25.5* 25.9* 25.8*  MCV 85.8   < > 85.0  --  86.6 87.3 86.9 84.9  PLT 170   < > 121*  --  120* 131* 146* 151   < > = values in this interval not displayed.     Coagulation Studies: No results for input(s): LABPROT, INR in the last 72 hours.  Imaging Reviewed: MRI brain    ASSESSMENT AND PLAN  Anoxic brain injury Parkinsonism with suspected Parkinson's dementia Myasthenia gravis Cardiac arrest on 11/ 6  Recommendations Palliative care consult Continue Parkinson's medications and Mestinon Correction of toxic metabolic derangements   Spoke with the patient's wife and discussed prognosis.  Patient certainly has suffered anoxic brain injury visible on MRI brain however does have brainstem reflexes intact and minimal withdrawal and possibly even following commands.  While it is early to prognosticate neurological recovery from his anoxic brain injury just 6 days out - especially if he was truly following commands as witnessed by CCM yesterday, I did express that I am very concerned about his ability to make good neurological recovery given his underlying Parkinson's disease/dementia and myasthenia gravis.  Wife appreciated input and said that she will speak to critical care team.  Goals of care discussion should be kept in mind patient's multiple medical  problems and functional baseline, and whether patient would necessarily want measures such as trach and PEG and prolonged nursing home stay.  The patient is neurologically critically ill due to anoxic brain injury, myasthenia gravis as well as Parkinson's disease.  I spent 35 minutes of critical care time with this patient including neurological examination, reviewing chart and labs, discussion with critical care team, complex decision making and time spent with family.  Karena Addison Aroor [facility] Pager Number [phone] For questions after 7pm please refer to AMION to reach the Neurologist on call

## 2019-06-16 NOTE — Progress Notes (Signed)
13 beat run of Vtach. Notified MD. Awaiting orders.  Jackalyn Lombard

## 2019-06-16 NOTE — Progress Notes (Signed)
Upon assessment, cortrak found clogged. Followed protocol to unclog, but unsuccessful. MD notified. Tube feeds running through OG tube.   Derek Harrell

## 2019-06-16 NOTE — Progress Notes (Signed)
NAME:  Derek Harrell, MRN:  [id number], DOB:  Feb 15, 1946, LOS: 56 ADMISSION DATE:  06/06/2019, CONSULTATION DATE:  11/2 REFERRING MD:  Johnney Killian, CHIEF COMPLAINT:  Hypertensive crisis and fever    Brief History   73 year old male w/ h/o parkinson's dementia. admitted 11/2 3 days s/p endovascular infrarenal aortic aneurysm repair w/ worsening weakness, MS change, HTN and fever.   Patient transferred out to the floor 11/5 afternoon. Then 11/6 early AM he suffered cardiac arrest. RN alerted by telemetry to a run of VT. He then became bradycardica and arrested. ACLS done for 12 mins prior to ROSC. He was unresponsive in the post-arrest setting. Transferred to [place] and intubated.   Past Medical History  AAA, prior CVA, HTN, HL, MG, RLS, PD, OSA (did not tolerate CPAP), gait disorder   Significant Hospital Events   11/2 >>  admitted to ICU 11/3  >>Bradycardic arrest from mucus plugging.  11/6 >> PEA Cardiac Arrest, ROSC after 12 minutes >> began TTM to 36 11/7 >> Re-warming 11/7 completed at noon   Consults:  Vascular surgery called; recommended treating HTN and medical issues. No imaging at this point   Procedures:  ETT 11/3 >> 11/4, 11/6 >> CVC LIJ 11/06 >> Right Radial Aline 11/06 >>   Significant Diagnostic Tests:  CT head 11/2 >> Stable white matter disease.  MRI brain 11/9 >> Restricted diffusion in the occipital lobes bilaterally and in the frontal parietal lobes over the convexity bilaterally compatible with acute or subacute infarction. Atrophy and extensive chronic ischemic change in the brain. Multiple areas of chronic microhemorrhage which have progressed since 2017.  Micro Data:  Blood cultures 11/2>> negative   SARS Cov2 11/2 >> negative. Blood cultures 11/6 >> negative  Urine culture 11/6 >> negative Tracheal aspirate 11/6 >> negative  Antimicrobials:  Cefepime 11/6 >> 11/10 Vancomycin 11/6 >> 11/10  Interim history/subjective:  No significant change.  Mains poorly  responsive.  Objective   Blood pressure (!) 149/82, pulse 67, temperature 98.7 F (37.1 C), temperature source Axillary, resp. rate 20, height 6' (1.829 m), weight 84.6 kg, SpO2 100 %.    Vent Mode: PRVC FiO2 (%):  [30 %] 30 % Set Rate:  [20 bmp] 20 bmp Vt Set:  [620 mL] 620 mL PEEP:  [5 cmH20] 5 cmH20 Pressure Support:  [5 cmH20] 5 cmH20 Plateau Pressure:  [14 cmH20-19 cmH20] 15 cmH20   Intake/Output Summary (Last 24 hours) at 06/16/2019 0838 Last data filed at 06/16/2019 0600 Gross per 24 hour  Intake 3427.56 ml  Output 2950 ml  Net 477.56 ml   Filed Weights   06/14/19 0345 06/15/19 0500 06/16/19 0321  Weight: 84.8 kg 86 kg 84.6 kg    Examination:  General: Ill-appearing male who is poorly responsive HEENT: Endotracheal tube is in place Neuro: Poorly responsive.  Extremely peripheral weakness.  Intermittently follows some commands CV: Heart sounds are regular PULM: Coarse rhonchi GI: soft, bsx4 active  Extremities: warm/dry 2+ edema and flaccid Skin: no rashes or lesions   Assessment & Plan:   Acute hypoxemic respiratory failure requiring mechanical ventilation concern for aspiration event with ongoing issues with swallowing and secretion management  OSA non-compliant with CPAP P:  Wean per protocol Underlying neuromuscular disease is a block to any attempted extubation Suspect he will require tracheostomy or goals of care discussion with comfort being goal Serial chest x-ray Pulmonary toilet  Cardiac arrest: etiology unclear, s/p TTM Intermittent bradycardia  -Suspect multifactorial respiratory failure led to cardiopulmonary  arrest -He has struggled with airway protection and has previously suffered cardiac arrest due to mucous plug. This arrest started as a run of NSVT and he then suffered a bradycardic arrest. 12 mins duration. P: Continue to monitor in ICU Monitor replete electrolytes  Acute encephalopathy with suspected hypoxic ischemic encephalopathy   Parkinson's disease w/ progressive debilitation  -EEG x2 consistent with encephalopathy without any active seizures -Per neurology unable to determine full extent of damage from anoxic injury, will allow more time to assess mentation prior to full determination  -MRI brain consistent with anoxic injury  P: Appreciate neurology's input Minimize sedation Continue parkinsonian medications  Hypertension P: Continue Norvasc Avoid AV nodal blockade  Dysphagia   P  He is swallowing well once extubated Due to his progressive neuromuscular days will probably require PEG aggressive treatment is continued   Myasthenia Gravis. P: Continue Mestinon  Hypernatremia Hyperchloremia P: Will again increase free water flushes  Need for close monitoring of fluid status given large free water amount  As of 11/11 pt has a free water deficit of 967ml  Best practice:  Diet: TFs Pain/Anxiety/Delirium protocol (if indicated): N/A.  AVOID ANTIPSYCHOTICS due to Parkinson's. VAP protocol (if indicated):Yes DVT prophylaxis: Heparin subq GI prophylaxis: PPI Glucose control: SSI. Mobility: BR Code Status: full code  Family Communication: Regular family updates Disposition: ICU.    Critical care:  App cct 40 min  Signature:   Richardson Landry Minor ACNP [facility] PCCM

## 2019-06-17 ENCOUNTER — Inpatient Hospital Stay (HOSPITAL_COMMUNITY): Payer: Medicare Other

## 2019-06-17 DIAGNOSIS — J9601 Acute respiratory failure with hypoxia: Secondary | ICD-10-CM | POA: Diagnosis not present

## 2019-06-17 DIAGNOSIS — G9340 Encephalopathy, unspecified: Secondary | ICD-10-CM | POA: Diagnosis not present

## 2019-06-17 DIAGNOSIS — I469 Cardiac arrest, cause unspecified: Secondary | ICD-10-CM | POA: Diagnosis not present

## 2019-06-17 LAB — CBC WITH DIFFERENTIAL/PLATELET
Abs Immature Granulocytes: 0.64 10*3/uL — ABNORMAL HIGH (ref 0.00–0.07)
Basophils Absolute: 0.1 10*3/uL (ref 0.0–0.1)
Basophils Relative: 1 %
Eosinophils Absolute: 0.6 10*3/uL — ABNORMAL HIGH (ref 0.0–0.5)
Eosinophils Relative: 6 %
HCT: 26.6 % — ABNORMAL LOW (ref 39.0–52.0)
Hemoglobin: 8.2 g/dL — ABNORMAL LOW (ref 13.0–17.0)
Immature Granulocytes: 6 %
Lymphocytes Relative: 12 %
Lymphs Abs: 1.4 10*3/uL (ref 0.7–4.0)
MCH: 26.4 pg (ref 26.0–34.0)
MCHC: 30.8 g/dL (ref 30.0–36.0)
MCV: 85.5 fL (ref 80.0–100.0)
Monocytes Absolute: 0.9 10*3/uL (ref 0.1–1.0)
Monocytes Relative: 8 %
Neutro Abs: 8.2 10*3/uL — ABNORMAL HIGH (ref 1.7–7.7)
Neutrophils Relative %: 67 %
Platelets: 171 10*3/uL (ref 150–400)
RBC: 3.11 MIL/uL — ABNORMAL LOW (ref 4.22–5.81)
RDW: 14.3 % (ref 11.5–15.5)
WBC: 11.7 10*3/uL — ABNORMAL HIGH (ref 4.0–10.5)
nRBC: 0 % (ref 0.0–0.2)

## 2019-06-17 LAB — BASIC METABOLIC PANEL
Anion gap: 6 (ref 5–15)
BUN: 50 mg/dL — ABNORMAL HIGH (ref 8–23)
CO2: 23 mmol/L (ref 22–32)
Calcium: 8.2 mg/dL — ABNORMAL LOW (ref 8.9–10.3)
Chloride: 117 mmol/L — ABNORMAL HIGH (ref 98–111)
Creatinine, Ser: 1.04 mg/dL (ref 0.61–1.24)
GFR calc Af Amer: >60 mL/min (ref >60)
GFR calc non Af Amer: >60 mL/min (ref >60)
Glucose, Bld: 114 mg/dL — ABNORMAL HIGH (ref 70–99)
Potassium: 3.8 mmol/L (ref 3.5–5.1)
Sodium: 146 mmol/L — ABNORMAL HIGH (ref 135–145)

## 2019-06-17 LAB — MAGNESIUM: Magnesium: 2.1 mg/dL (ref 1.7–2.4)

## 2019-06-17 LAB — GLUCOSE, CAPILLARY
Glucose-Capillary: 103 mg/dL — ABNORMAL HIGH (ref 70–99)
Glucose-Capillary: 107 mg/dL — ABNORMAL HIGH (ref 70–99)
Glucose-Capillary: 107 mg/dL — ABNORMAL HIGH (ref 70–99)
Glucose-Capillary: 114 mg/dL — ABNORMAL HIGH (ref 70–99)

## 2019-06-17 LAB — PHOSPHORUS: Phosphorus: 3.5 mg/dL (ref 2.5–4.6)

## 2019-06-17 MED ORDER — PNEUMOCOCCAL VAC POLYVALENT 25 MCG/0.5ML IJ INJ
0.5000 mL | INJECTION | INTRAMUSCULAR | Status: AC
  Administered 2019-06-20: 10:00:00 0.5 mL via INTRAMUSCULAR
  Filled 2019-06-17: qty 0.5

## 2019-06-17 MED ORDER — SODIUM CHLORIDE 0.9% FLUSH: 10.0000 mL | INTRAVENOUS | Status: DC | PRN

## 2019-06-17 MED ORDER — POLYETHYLENE GLYCOL 3350 17 G PO PACK
17.0000 g | PACK | Freq: Every day | ORAL | Status: DC | PRN
  Administered 2019-06-17 – 2019-07-21 (×4): 17 g via ORAL
  Filled 2019-06-17 (×4): qty 1

## 2019-06-17 NOTE — Telephone Encounter (Signed)
Long discussion with pts wife.  Discussed goals of care.  She thanked me for call and will let me know if she needs anything from [place]

## 2019-06-17 NOTE — Telephone Encounter (Signed)
Patients wife called, patient is in the hospital on a ventilator. He was moved from ICU to a regular room and within 24 hours he went into cardiac arrest and was unresponsive for 24 minutes. She has to decide on putting in a trach for him or not. She said he is still on his medications. She is very upset, patient is not showing any signs of responses but they are not stating he has no brain function. Just a [place]

## 2019-06-17 NOTE — Telephone Encounter (Signed)
Left vm message on wife vm that I returned call

## 2019-06-17 NOTE — Progress Notes (Signed)
NAME:  Derek Harrell, MRN:  [id number], DOB:  [birth year], LOS: 59 ADMISSION DATE:  06/06/2019, CONSULTATION DATE:  11/2 REFERRING MD:  Johnney Killian, CHIEF COMPLAINT:  Hypertensive crisis and fever    Brief History   73 year old male w/ h/o parkinson's dementia. admitted 11/2 3 days s/p endovascular infrarenal aortic aneurysm repair w/ worsening weakness, MS change, HTN and fever.   Patient transferred out to the floor 11/5 afternoon. Then 11/6 early AM he suffered cardiac arrest. RN alerted by telemetry to a run of VT. He then became bradycardica and arrested. ACLS done for 12 mins prior to ROSC. He was unresponsive in the post-arrest setting. Transferred to [place] and intubated.   Past Medical History  AAA, prior CVA, HTN, HL, MG, RLS, PD, OSA (did not tolerate CPAP), gait disorder   Significant Hospital Events   11/2 >>  admitted to ICU 11/3  >>Bradycardic arrest from mucus plugging.  11/6 >> PEA Cardiac Arrest, ROSC after 12 minutes >> began TTM to 36 11/7 >> Re-warming 11/7 completed at noon   Consults:  Vascular surgery called; recommended treating HTN and medical issues. No imaging at this point   Procedures:  ETT 11/3 >> 11/4, 11/6 >> CVC LIJ 11/06 >> Right Radial Aline 11/06 >>   Significant Diagnostic Tests:  CT head 11/2 >> Stable white matter disease.  MRI brain 11/9 >> Restricted diffusion in the occipital lobes bilaterally and in the frontal parietal lobes over the convexity bilaterally compatible with acute or subacute infarction. Atrophy and extensive chronic ischemic change in the brain. Multiple areas of chronic microhemorrhage which have progressed since 2017.  Micro Data:  Blood cultures 11/2>> negative   SARS Cov2 11/2 >> negative. Blood cultures 11/6 >> negative  Urine culture 11/6 >> negative Tracheal aspirate 11/6 >> negative  Antimicrobials:  Cefepime 11/6 >> 11/10 Vancomycin 11/6 >> 11/10  Interim history/subjective:  No significant change.  Ongoing discussions  with the family concerning continued aggressive measures i.e. tracheostomy PEG placement and transfer to a skilled nursing facility versus comfort care.  A.m. labs  Objective   Blood pressure (!) 151/81, pulse 66, temperature 98.2 F (36.8 C), temperature source Oral, resp. rate 13, height 6' (1.829 m), weight 85.2 kg, SpO2 99 %.    Vent Mode: PSV;CPAP FiO2 (%):  [30 %] 30 % Set Rate:  [20 bmp] 20 bmp Vt Set:  [id number] mL] 620 mL PEEP:  [5 cmH20] 5 cmH20 Pressure Support:  [10 cmH20] 10 cmH20 Plateau Pressure:  [14 cmH20-15 cmH20] 14 cmH20   Intake/Output Summary (Last 24 hours) at 06/17/2019 [id number] Last data filed at 06/17/2019 0600 Gross per 24 hour  Intake 2524.24 ml  Output 2100 ml  Net 424.24 ml   Filed Weights   06/15/19 0500 06/16/19 0321 06/17/19 0308  Weight: 86 kg 84.6 kg 85.2 kg    Examination:  General: Flaccid male who is nonresponsive HEENT: Pupils equal reactive light does not track or follow Neuro: Flaccid in all extremities does not respond to noxious stimuli CV: Currently in sinus rhythm reported to have prolonged ventricular tachycardia during the night PULM: Diminished in the bases GI: soft, bsx4 active  Extremities: warm/dry, 2+ edema  Skin: no rashes or lesions    Assessment & Plan:   Acute hypoxemic respiratory failure requiring mechanical ventilation concern for aspiration event with ongoing issues with swallowing and secretion management  OSA non-compliant with CPAP P:  Not weanable currently secondary to anoxic brain injury coupled with Parkinson's disease and  myasthenia gravis Intermittent chest x-ray  Cardiac arrest: etiology unclear, s/p TTM Intermittent bradycardia /runs of ventricular tachycardia -Suspect multifactorial respiratory failure led to cardiopulmonary arrest -He has struggled with airway protection and has previously suffered cardiac arrest due to mucous plug. This arrest started as a run of NSVT and he then suffered a bradycardic  arrest. 12 mins duration. 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  Parkinson's disease w/ progressive debilitation coupled with myasthenia gravis -EEG x2 consistent with encephalopathy without any active seizures -Per neurology unable to determine full extent of damage from anoxic injury, will allow more time to assess mentation prior to full determination  -MRI brain consistent with anoxic injury  P: Appreciate neurology's input Overall very poor prognosis Continue discussions with family concerning goals of care  Hypertension P: Continue Norvasc  Dysphagia   P Due to severity of his neuromuscular disease unable to evaluate Currently on tube feedings  Myasthenia Gravis. P: Continue Mestinon  Hypernatremia Hyperchloremia Recent Labs  Lab 06/16/19 0322 06/16/19 2125 06/17/19 0307  NA 150* 146* 146*    P: Sodium is decreased to 146 Continue to monitor intake and output  Best practice:  Diet: TFs Pain/Anxiety/Delirium protocol (if indicated): N/A.  AVOID ANTIPSYCHOTICS due to Parkinson's. VAP protocol (if indicated):Yes DVT prophylaxis: Heparin subq GI prophylaxis: PPI Glucose control: SSI. Mobility: BR Code Status: full code  Family Communication: Wife and family regularly updated per critical care and neurology. Disposition: ICU.    Critical care:  App cct 40 min  Signature:   Richardson Landry  ACNP [facility] PCCM

## 2019-06-17 NOTE — Progress Notes (Signed)
Cortrak Tube Team Note:  RN notified Cortrak team that tube is clogged, will not flush.   RD successfully unclogged Cortrak tube and tube was flushed multiple times without resistance. Cortrak is ok for use.  Please use OGT for medication administration.   If the tube becomes dislodged please keep the tube and contact the Cortrak team at [email or web address] ([email or web address]) for replacement.  If after hours and replacement cannot be delayed, place a NG tube and confirm placement with an abdominal x-ray.   Mariana Single RD, LDN Clinical Nutrition Pager # [phone]

## 2019-06-18 ENCOUNTER — Inpatient Hospital Stay (HOSPITAL_COMMUNITY): Payer: Medicare Other

## 2019-06-18 DIAGNOSIS — F0280 Dementia in other diseases classified elsewhere without behavioral disturbance: Secondary | ICD-10-CM

## 2019-06-18 DIAGNOSIS — G20 Parkinson's disease: Secondary | ICD-10-CM

## 2019-06-18 DIAGNOSIS — I469 Cardiac arrest, cause unspecified: Secondary | ICD-10-CM | POA: Diagnosis not present

## 2019-06-18 DIAGNOSIS — J9601 Acute respiratory failure with hypoxia: Secondary | ICD-10-CM | POA: Diagnosis not present

## 2019-06-18 DIAGNOSIS — G931 Anoxic brain damage, not elsewhere classified: Secondary | ICD-10-CM

## 2019-06-18 DIAGNOSIS — G9340 Encephalopathy, unspecified: Secondary | ICD-10-CM | POA: Diagnosis not present

## 2019-06-18 LAB — BASIC METABOLIC PANEL
Anion gap: 10 (ref 5–15)
BUN: 49 mg/dL — ABNORMAL HIGH (ref 8–23)
CO2: 23 mmol/L (ref 22–32)
Calcium: 8.3 mg/dL — ABNORMAL LOW (ref 8.9–10.3)
Chloride: 111 mmol/L (ref 98–111)
Creatinine, Ser: 0.88 mg/dL (ref 0.61–1.24)
GFR calc Af Amer: >60 mL/min (ref >60)
GFR calc non Af Amer: >60 mL/min (ref >60)
Glucose, Bld: 109 mg/dL — ABNORMAL HIGH (ref 70–99)
Potassium: 3.9 mmol/L (ref 3.5–5.1)
Sodium: 144 mmol/L (ref 135–145)

## 2019-06-18 LAB — CBC WITH DIFFERENTIAL/PLATELET
Abs Immature Granulocytes: 0.55 10*3/uL — ABNORMAL HIGH (ref 0.00–0.07)
Basophils Absolute: 0 10*3/uL (ref 0.0–0.1)
Basophils Relative: 0 %
Eosinophils Absolute: 0.5 10*3/uL (ref 0.0–0.5)
Eosinophils Relative: 5 %
HCT: 25.4 % — ABNORMAL LOW (ref 39.0–52.0)
Hemoglobin: 8.1 g/dL — ABNORMAL LOW (ref 13.0–17.0)
Immature Granulocytes: 5 %
Lymphocytes Relative: 11 %
Lymphs Abs: 1.2 10*3/uL (ref 0.7–4.0)
MCH: 27.3 pg (ref 26.0–34.0)
MCHC: 31.9 g/dL (ref 30.0–36.0)
MCV: 85.5 fL (ref 80.0–100.0)
Monocytes Absolute: 0.7 10*3/uL (ref 0.1–1.0)
Monocytes Relative: 7 %
Neutro Abs: 7.7 10*3/uL (ref 1.7–7.7)
Neutrophils Relative %: 72 %
Platelets: 193 10*3/uL (ref 150–400)
RBC: 2.97 MIL/uL — ABNORMAL LOW (ref 4.22–5.81)
RDW: 14.3 % (ref 11.5–15.5)
WBC: 10.8 10*3/uL — ABNORMAL HIGH (ref 4.0–10.5)
nRBC: 0 % (ref 0.0–0.2)

## 2019-06-18 LAB — GLUCOSE, CAPILLARY
Glucose-Capillary: 100 mg/dL — ABNORMAL HIGH (ref 70–99)
Glucose-Capillary: 106 mg/dL — ABNORMAL HIGH (ref 70–99)
Glucose-Capillary: 111 mg/dL — ABNORMAL HIGH (ref 70–99)
Glucose-Capillary: 114 mg/dL — ABNORMAL HIGH (ref 70–99)
Glucose-Capillary: 122 mg/dL — ABNORMAL HIGH (ref 70–99)
Glucose-Capillary: 87 mg/dL (ref 70–99)
Glucose-Capillary: 88 mg/dL (ref 70–99)

## 2019-06-18 LAB — MAGNESIUM: Magnesium: 2 mg/dL (ref 1.7–2.4)

## 2019-06-18 LAB — PHOSPHORUS: Phosphorus: 3.8 mg/dL (ref 2.5–4.6)

## 2019-06-18 MED ORDER — FUROSEMIDE 10 MG/ML IJ SOLN
20.0000 mg | Freq: Two times a day (BID) | INTRAMUSCULAR | Status: AC
  Administered 2019-06-18 – 2019-06-20 (×4): 20 mg via INTRAVENOUS
  Filled 2019-06-18 (×4): qty 2

## 2019-06-18 NOTE — Progress Notes (Signed)
NAME:  Derek Harrell, MRN:  [id number], DOB:  1946-01-27, LOS: 12 ADMISSION DATE:  06/06/2019, CONSULTATION DATE:  11/2 REFERRING MD:  Johnney Killian, CHIEF COMPLAINT:  Hypertensive crisis and fever    Brief History   73 year old male w/ h/o parkinson's dementia. admitted 11/2 3 days s/p endovascular infrarenal aortic aneurysm repair w/ worsening weakness, MS change, HTN and fever.   Patient transferred out to the floor 11/5 afternoon. Then 11/6 early AM he suffered cardiac arrest. RN alerted by telemetry to a run of VT. He then became bradycardica and arrested. ACLS done for 12 mins prior to ROSC. He was unresponsive in the post-arrest setting. Transferred to [facility] and intubated.   Past Medical History  AAA, prior CVA, HTN, HL, MG, RLS, PD, OSA (did not tolerate CPAP), gait disorder   Significant Hospital Events   11/2 >>  admitted to ICU 11/3  >>Bradycardic arrest from mucus plugging.  11/6 >> PEA Cardiac Arrest, ROSC after 12 minutes >> began TTM to 36 11/7 >> Re-warming 11/7 completed at noon   Consults:  Vascular surgery called; recommended treating HTN and medical issues. No imaging at this point   Procedures:  ETT 11/3 >> 11/4, 11/6 >> CVC LIJ 11/06 >> Right Radial Aline 11/06 >>   Significant Diagnostic Tests:  CT head 11/2 >> Stable white matter disease.  MRI brain 11/9 >> Restricted diffusion in the occipital lobes bilaterally and in the frontal parietal lobes over the convexity bilaterally compatible with acute or subacute infarction. Atrophy and extensive chronic ischemic change in the brain. Multiple areas of chronic microhemorrhage which have progressed since 2017.  Micro Data:  Blood cultures 11/2>> negative   SARS Cov2 11/2 >> negative. Blood cultures 11/6 >> negative  Urine culture 11/6 >> negative Tracheal aspirate 11/6 >> negative  Antimicrobials:  Cefepime 11/6 >> 11/10 Vancomycin 11/6 >> 11/10  Interim history/subjective:   Patient remains intubated on mechanical  life support in the intensive care unit  Objective   Blood pressure (!) 143/77, pulse 82, temperature 99.2 F (37.3 C), temperature source Oral, resp. rate 18, height 6' (1.829 m), weight 85.4 kg, SpO2 100 %.    Vent Mode: PRVC FiO2 (%):  [30 %] 30 % Set Rate:  [20 bmp] 20 bmp Vt Set:  [620 mL] 620 mL PEEP:  [5 cmH20] 5 cmH20 Pressure Support:  [5 cmH20] 5 cmH20 Plateau Pressure:  [16 cmH20-18 cmH20] 18 cmH20   Intake/Output Summary (Last 24 hours) at 06/18/2019 0948 Last data filed at 06/18/2019 0600 Gross per 24 hour  Intake 4194.83 ml  Output 3100 ml  Net 1094.83 ml   Filed Weights   06/16/19 0321 06/17/19 0308 06/18/19 0344  Weight: 84.6 kg 85.2 kg 85.4 kg    Examination:  General: Elderly male intubated on mechanical life support, unresponsive, off sedation HEENT: Pupils reactive to light, does not track has roving eye movements this morning, has random fluttering of the eyes but no clear blinking. Neuro: Flaccid 4 extremities, will not respond to any painful stimuli, also does not have cough or gag today. CV: Sinus on telemetry, S1-S2, regular rate and rhythm PULM: Lateral ventilated breath sounds GI: Soft nondistended Extremities: Dependent edema in the bilateral upper and lower extremities Skin: No rash   Assessment & Plan:   Acute hypoxemic respiratory failure requiring mechanical ventilation concern for aspiration event with ongoing issues with swallowing and secretion management  OSA non-compliant with CPAP Mental status precludes liberation from mechanical ventilator P: Unable to  wean from mechanical ventilator due to severe anoxic brain injury, Parkinson's disease and myasthenia gravis. I believe the most appropriate next step would be liberation from mechanical ventilator as a one-way extubation and if the patient does not tolerate transition to comfort care measures.  Cardiac arrest: etiology unclear, s/p TTM Intermittent bradycardia /runs of ventricular  tachycardia -Suspect multifactorial respiratory failure led to cardiopulmonary arrest -He has struggled with airway protection and has previously suffered cardiac arrest due to mucous plug. This arrest started as a run of NSVT and he then suffered a bradycardic arrest. 12 mins duration. 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 Carles Collet.  Hypertension P: Continue home Norvasc  Dysphagia   P Related to use neuromuscular disease as stated above, Parkinson's and myasthenia Ultimately if family decides for tracheostomy tube will also need PEG  Myasthenia Gravis. P: Continue home Mestinon  Hypernatremia Hyperchloremia Recent Labs  Lab 06/16/19 2125 06/17/19 0307 06/18/19 0344  NA 146* 146* 144   P: Free water with tube feeds continued.  Best practice:  Diet: TFs Pain/Anxiety/Delirium protocol (if indicated): N/A.  AVOID ANTIPSYCHOTICS due to Parkinson's. VAP protocol (if indicated):Yes DVT prophylaxis: Heparin subq GI prophylaxis: PPI Glucose control: SSI. Mobility: BR Code Status: full code  Family Communication: family making decision by Monday  Disposition: ICU.     This patient is critically ill with multiple organ system failure; which, requires frequent high complexity decision making, assessment, support, evaluation, and titration of therapies. This was completed through the application of  advanced monitoring technologies and extensive interpretation of multiple databases. During this encounter critical care time was devoted to patient care services described in this note for 31 minutes.  Garner Nash, DO [facility] 06/18/2019 9:48 AM

## 2019-06-19 DIAGNOSIS — G20 Parkinson's disease: Secondary | ICD-10-CM | POA: Diagnosis not present

## 2019-06-19 DIAGNOSIS — Z7189 Other specified counseling: Secondary | ICD-10-CM

## 2019-06-19 DIAGNOSIS — J9601 Acute respiratory failure with hypoxia: Secondary | ICD-10-CM | POA: Diagnosis not present

## 2019-06-19 DIAGNOSIS — I469 Cardiac arrest, cause unspecified: Secondary | ICD-10-CM | POA: Diagnosis not present

## 2019-06-19 LAB — GLUCOSE, CAPILLARY
Glucose-Capillary: 104 mg/dL — ABNORMAL HIGH (ref 70–99)
Glucose-Capillary: 110 mg/dL — ABNORMAL HIGH (ref 70–99)
Glucose-Capillary: 117 mg/dL — ABNORMAL HIGH (ref 70–99)
Glucose-Capillary: 123 mg/dL — ABNORMAL HIGH (ref 70–99)
Glucose-Capillary: 125 mg/dL — ABNORMAL HIGH (ref 70–99)
Glucose-Capillary: 88 mg/dL (ref 70–99)

## 2019-06-19 NOTE — Progress Notes (Signed)
Patient ran a 7-beat run of v-tach. VSS. Elink notified and labs ordered and drawn. Will continue to monitor patient.

## 2019-06-19 NOTE — Progress Notes (Addendum)
Patient's heart rate dropped to 40s and goes back to 70-80s. BP stable. Patient's baseline is non-responsive, not alert. Potassium, magnesium, and phosphorous WNL. MD Icard made aware. No new orders. Will continue to monitor.

## 2019-06-19 NOTE — Progress Notes (Signed)
NAME:  Derek Harrell, MRN:  [id number], DOB:  1945-10-22, LOS: 94 ADMISSION DATE:  06/06/2019, CONSULTATION DATE:  11/2 REFERRING MD:  Johnney Killian, CHIEF COMPLAINT:  Hypertensive crisis and fever    Brief History   73 year old male w/ h/o parkinson's dementia. admitted 11/2 3 days s/p endovascular infrarenal aortic aneurysm repair w/ worsening weakness, MS change, HTN and fever.   Patient transferred out to the floor 11/5 afternoon. Then 11/6 early AM he suffered cardiac arrest. RN alerted by telemetry to a run of VT. He then became bradycardica and arrested. ACLS done for 12 mins prior to ROSC. He was unresponsive in the post-arrest setting. Transferred to [facility] and intubated.   Past Medical History  AAA, prior CVA, HTN, HL, MG, RLS, PD, OSA (did not tolerate CPAP), gait disorder   Significant Hospital Events   11/2 >>  admitted to ICU 11/3  >>Bradycardic arrest from mucus plugging.  11/6 >> PEA Cardiac Arrest, ROSC after 12 minutes >> began TTM to 36 11/7 >> Re-warming 11/7 completed at noon   Consults:  Vascular surgery called; recommended treating HTN and medical issues. No imaging at this point   Procedures:  ETT 11/3 >> 11/4, 11/6 >> CVC LIJ 11/06 >> Right Radial Aline 11/06 >>   Significant Diagnostic Tests:  CT head 11/2 >> Stable white matter disease.  MRI brain 11/9 >> Restricted diffusion in the occipital lobes bilaterally and in the frontal parietal lobes over the convexity bilaterally compatible with acute or subacute infarction. Atrophy and extensive chronic ischemic change in the brain. Multiple areas of chronic microhemorrhage which have progressed since 2017.  Micro Data:  Blood cultures 11/2>> negative   SARS Cov2 11/2 >> negative. Blood cultures 11/6 >> negative  Urine culture 11/6 >> negative Tracheal aspirate 11/6 >> negative  Antimicrobials:  Cefepime 11/6 >> 11/10 Vancomycin 11/6 >> 11/10  Interim history/subjective:  Patient remains intubated on mechanical  life support in the intensive care unit.  Objective   Blood pressure (!) 155/71, pulse 78, temperature 97.8 F (36.6 C), temperature source Axillary, resp. rate 20, height 6' (1.829 m), weight 86.2 kg, SpO2 100 %.    Vent Mode: PRVC FiO2 (%):  [30 %] 30 % Set Rate:  [20 bmp] 20 bmp Vt Set:  [620 mL] 620 mL PEEP:  [5 cmH20] 5 cmH20 Plateau Pressure:  [14 cmH20-17 cmH20] 14 cmH20   Intake/Output Summary (Last 24 hours) at 06/19/2019 0834 Last data filed at 06/19/2019 0800 Gross per 24 hour  Intake 3629.86 ml  Output 2975 ml  Net 654.86 ml   Filed Weights   06/17/19 0308 06/18/19 0344 06/19/19 0500  Weight: 85.2 kg 85.4 kg 86.2 kg    Examination:  General: Elderly male intubated on mechanical life support unresponsive has been off sedation for several days HEENT: Pupils are reactive does not track has roving eye movements and random eye fluttering does not blink to attack Neuro: Acid 4 extremities no response to noxious stimuli no cough no gag. CV: Sinus on telemetry, S1-S2, no MRG, regular rate and rhythm PULM: Bilateral ventilated breath sounds GI: Soft nontender nondistended Extremities: Dependent edema in the upper and lower extremities Skin: No obvious rash   Assessment & Plan:   Acute hypoxemic respiratory failure requiring mechanical ventilation concern for aspiration event with ongoing issues with swallowing and secretion management  OSA non-compliant with CPAP Mental status precludes liberation from mechanical ventilator P:  Unable to wean from mechanical support due to severe anoxic brain  injury, Parkinson disease and myasthenia gravis. I believe the next best of appropriate step would be one-way extubation.  And if the patient does not maintain or is unable to protect his airway that we would transition to comfort care measures.  This will be expressed to the family.  Cardiac arrest: etiology unclear, s/p TTM Intermittent bradycardia /runs of ventricular  tachycardia -Suspect multifactorial respiratory failure led to cardiopulmonary arrest -He has struggled with airway protection and has previously suffered cardiac arrest due to mucous plug. This arrest started as a run of NSVT and he then suffered a bradycardic arrest. 12 mins duration. P: Patient remains on telemetry Occasional bradycardic episodes again occurred this morning Continue to observe I suspect this is related to multifactorial including his brain injury.  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 I called and discussed this with neurology yesterday.  Hypertension P: Continue home Norvasc  Dysphagia   P He will also need PEG tube if they decide for tracheostomy  Myasthenia Gravis. P: Continue home Mestinon  Hypernatremia Hyperchloremia Recent Labs  Lab 06/16/19 2125 06/17/19 0307 06/18/19 0344  NA 146* 146* 144   P: Continue free water  Best practice:  Diet: TFs Pain/Anxiety/Delirium protocol (if indicated): N/A.  AVOID ANTIPSYCHOTICS due to Parkinson's. VAP protocol (if indicated):Yes DVT prophylaxis: Heparin subq GI prophylaxis: PPI Glucose control: SSI. Mobility: BR Code Status: full code  Family Communication: family making decision by Monday  Disposition: ICU.     This patient is critically ill with multiple organ system failure; which, requires frequent high complexity decision making, assessment, support, evaluation, and titration of therapies. This was completed through the application of advanced monitoring technologies and extensive interpretation of  multiple databases. During this encounter critical care time was devoted to patient care services described in this note for 32 minutes.   Garner Nash, DO [facility] 06/19/2019 8:34 AM

## 2019-06-20 ENCOUNTER — Encounter (HOSPITAL_COMMUNITY): Payer: Self-pay | Admitting: Primary Care

## 2019-06-20 DIAGNOSIS — I469 Cardiac arrest, cause unspecified: Secondary | ICD-10-CM | POA: Diagnosis not present

## 2019-06-20 DIAGNOSIS — Z515 Encounter for palliative care: Secondary | ICD-10-CM | POA: Diagnosis not present

## 2019-06-20 DIAGNOSIS — Z7189 Other specified counseling: Secondary | ICD-10-CM | POA: Diagnosis not present

## 2019-06-20 DIAGNOSIS — J9601 Acute respiratory failure with hypoxia: Secondary | ICD-10-CM | POA: Diagnosis not present

## 2019-06-20 LAB — BASIC METABOLIC PANEL
Anion gap: 10 (ref 5–15)
BUN: 49 mg/dL — ABNORMAL HIGH (ref 8–23)
CO2: 23 mmol/L (ref 22–32)
Calcium: 8.3 mg/dL — ABNORMAL LOW (ref 8.9–10.3)
Chloride: 107 mmol/L (ref 98–111)
Creatinine, Ser: 0.94 mg/dL (ref 0.61–1.24)
GFR calc Af Amer: >60 mL/min (ref >60)
GFR calc non Af Amer: >60 mL/min (ref >60)
Glucose, Bld: 111 mg/dL — ABNORMAL HIGH (ref 70–99)
Potassium: 3.8 mmol/L (ref 3.5–5.1)
Sodium: 140 mmol/L (ref 135–145)

## 2019-06-20 LAB — MAGNESIUM: Magnesium: 2 mg/dL (ref 1.7–2.4)

## 2019-06-20 LAB — GLUCOSE, CAPILLARY
Glucose-Capillary: 107 mg/dL — ABNORMAL HIGH (ref 70–99)
Glucose-Capillary: 113 mg/dL — ABNORMAL HIGH (ref 70–99)
Glucose-Capillary: 116 mg/dL — ABNORMAL HIGH (ref 70–99)
Glucose-Capillary: 91 mg/dL (ref 70–99)
Glucose-Capillary: 112 mg/dL — ABNORMAL HIGH (ref 70–99)
Glucose-Capillary: 112 mg/dL — ABNORMAL HIGH (ref 70–99)

## 2019-06-20 MED ORDER — FREE WATER
300.0000 mL | Status: DC
  Administered 2019-06-20 – 2019-06-21 (×5): 300 mL

## 2019-06-20 NOTE — Progress Notes (Signed)
NAME:  Derek Harrell, MRN:  [id number], DOB:  08-Feb-1946, LOS: 61 ADMISSION DATE:  06/06/2019, CONSULTATION DATE:  11/2 REFERRING MD:  Johnney Killian, CHIEF COMPLAINT:  Hypertensive crisis and fever    Brief History   73 year old male w/ h/o parkinson's dementia.  Admitted 11/2, 3 days s/p endovascular infrarenal aortic aneurysm repair w/ worsening weakness, MS change, HTN and fever.   Patient transferred out to the floor 11/5 afternoon.  Then 11/6 early AM he suffered cardiac arrest.  RN alerted by telemetry to a run of VT.  He then became bradycardica and arrested. ACLS done for 12 mins prior to ROSC. He was unresponsive in the post-arrest setting. Transferred to [facility] and intubated.   Past Medical History  AAA, prior CVA, HTN, HL, MG, RLS, PD, OSA (did not tolerate CPAP), gait disorder   Significant Hospital Events   11/2 >>  admitted to ICU 11/3  >>Bradycardic arrest from mucus plugging.  11/6 >> PEA Cardiac Arrest, ROSC after 12 minutes >> began TTM to 36 11/7 >> Re-warming 11/7 completed at noon   Consults:  Vascular surgery called; recommended treating HTN and medical issues. No imaging at this point   Procedures:  ETT 11/3 >> 11/4, 11/6 >> CVC LIJ 11/06 >> Right Radial Aline 11/06 >>   Significant Diagnostic Tests:  CT head 11/2 >> Stable white matter disease.  MRI brain 11/9 >> Restricted diffusion in the occipital lobes bilaterally and in the frontal parietal lobes over the convexity bilaterally compatible with acute or subacute infarction. Atrophy and extensive chronic ischemic change in the brain. Multiple areas of chronic microhemorrhage which have progressed since 2017.  Micro Data:  Blood cultures 11/2>> negative   SARS Cov2 11/2 >> negative. Blood cultures 11/6 >> negative  Urine culture 11/6 >> negative Tracheal aspirate 11/6 >> negative  Antimicrobials:  Cefepime 11/6 >> 11/10 Vancomycin 11/6 >> 11/10  Interim history/subjective:  No changes overnight  Remains  afebrile, no changes  Objective   Blood pressure 134/71, pulse 69, temperature 98.9 F (37.2 C), temperature source Oral, resp. rate 20, height 6' (1.829 m), weight 85.6 kg, SpO2 100 %.    Vent Mode: PRVC FiO2 (%):  [30 %] 30 % Set Rate:  [20 bmp] 20 bmp Vt Set:  [id number] mL] 620 mL PEEP:  [5 cmH20] 5 cmH20 Plateau Pressure:  [9 cmH20-17 cmH20] 9 cmH20   Intake/Output Summary (Last 24 hours) at 06/20/2019 1058 Last data filed at 06/20/2019 1000 Gross per 24 hour  Intake 3869.68 ml  Output 4450 ml  Net -580.32 ml   Filed Weights   06/18/19 0344 06/19/19 0500 06/20/19 0500  Weight: 85.4 kg 86.2 kg 85.6 kg    Examination:  General:  Older male on MV in NAD HEENT: MM pink/moist, ETT/ R nare cortrak, pupils 4/reactive  Neuro: Eyes flutter, otherwise, no response to verbal or noxious stimuli  CV: rr, no murmur PULM:  MV supported breaths, clear, scant white secretions, flipped to PSV 10/5 doing well GI: soft, bs +, ND, condom cath  Extremities: warm/dry, generalized dependent edema Skin: no rashes    Assessment & Plan:   Acute hypoxemic respiratory failure requiring mechanical ventilation concern for aspiration event with ongoing issues with swallowing and secretion management  OSA non-compliant with CPAP Mental status precludes liberation from mechanical ventilator P: Full MV support  Daily SBT- doing ok thus far today on PSV 10/5/ mental status will be barrier to extubation  Pending family discussions for GOCs/ trach/ PEG  VAP bundle   Cardiac arrest: etiology unclear, s/p TTM Intermittent bradycardia /runs of ventricular tachycardia -Suspect multifactorial respiratory failure led to cardiopulmonary arrest -He has struggled with airway protection and has previously suffered cardiac arrest due to mucous plug. This arrest started as a run of NSVT and he then suffered a bradycardic arrest. 12 mins duration. P: Unclear etiology of bradycardic episodes, likely multifactorial,  possibly related to brain injury vs ongoing mucous clearance  Tele monitoring Goal K >4/ Mag > 2   Hypoxic Acute encephalopathy with Parkinson's disease w/ progressive debilitation coupled with myasthenia gravis -EEG x2 consistent with encephalopathy without any active seizures -MRI brain consistent with anoxic injury  P: Appreciate neurology's input Neurologic prognostication remains guarded   Hypertension P: Continue norvasc  Dysphagia   P Continue TF GOC pending- PEG if tracheostomy is pursued    Myasthenia Gravis P: Continue home Mestinon  Hypernatremia Hyperchloremia Recent Labs  Lab 06/17/19 0307 06/18/19 0344 06/19/19 2306  NA 146* 144 140   P: Decrease free water to 300 ml q 4 hrs  Best practice:  Diet: TFs at goal  Pain/Anxiety/Delirium protocol (if indicated): N/A.  AVOID ANTIPSYCHOTICS due to Parkinson's. VAP protocol (if indicated):Yes DVT prophylaxis: Heparin subq GI prophylaxis: PPI Glucose control: SSI Mobility: BR Code Status: full code  Family Communication: wife called by Dr. Tamala Julian  Disposition: ICU.    CCT  30 mins   Kennieth Rad, MSN, AGACNP-BC [facility] 06/20/2019, 10:58 AM

## 2019-06-20 NOTE — Consult Note (Signed)
Consultation Note Date: 06/20/2019   Patient Name: Derek Harrell  DOB: 02-02-1946  MRN: [id number]  Age / Sex: 73 y.o., male  PCP: [name], MD Referring Physician: Laurin Coder, MD  Reason for Consultation: Establishing goals of care and Psychosocial/spiritual support  HPI/Patient Profile: 73 y.o. male  with past medical history of  discharged from hospital 3 days PTA s/p endovascular infrarenal AAA repair, h/o parkinson's disease, some degree of dementia. had worsening confusion (baseline  2 yrs of intermittent confusion and hallucinations at home, prior CVA, OSA not tolerated CPAP, baseline inc of urine wears depends), increased lethargy, poor PO intake, more difficulty w/ mobility admitted on 06/06/2019 with brady arrest from mucous plugging.   Clinical Assessment and Goals of Care: Mr. Sherrin is resting quietly in bed.  He is intubated/ventilated, but not sedated.  He does not open his eyes to voice or touch.  There is no family at bedside at this time.  Conference with CCM related to patient condition, needs, family meeting today at 3 PM.  Family meeting with CCM NP Moshe Cipro, wife [name] at bedside with adult children Merrilee Seashore, [place]p [place], and Ramona via phone.  We talk about his acute and chronic health issues.We talk about his functional status.  Family asks questions about goals of care, trach/PEG vs Hospice type care.   PMT to continue to follow.    HCPOA    NEXT OF KIN - spouse Sherlene Shams, married " a few years", shares she is the 4th wife. .   Adult son Starling Christofferson. Merrilee Seashore), daughters Raquel and [place].     SUMMARY OF RECOMMENDATIONS   Full code/full scope  Code Status/Advance Care Planning:  Full code  Symptom Management:   Per hospitalist, no additional needs at this time.  Palliative Prophylaxis:   Oral Care and Turn Reposition  Additional Recommendations (Limitations, Scope,  Preferences):  Full Scope Treatment  Psycho-social/Spiritual:   Desire for further Chaplaincy support:no  Additional Recommendations: Caregiving  Support/Resources and Education on Hospice  Prognosis:   Unable to determine, based on outcomes.  Discharge Planning: To be determined, based on outcomes.      Primary Diagnoses: Present on Admission: **None**   I have reviewed the medical record, interviewed the patient and family, and examined the patient. The following aspects are pertinent.  Past Medical History:  Diagnosis Date  . Abdominal aortic aneurysm ([place])   . Abnormality of gait 04/12/2013  . Cerebrovascular disease   . Constipation   . Dizziness   . Gait disorder   . Hyperlipidemia   . Hypertension   . 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 Myasthenia gravis ([place]) 01/07/2016  . Obesity   . Parkinson disease ([place])   . Pseudobulbar affect   . RLS (restless legs syndrome) 06/09/2016  . Sleep apnea    "I tried CPAP; didn't work for me" (08/22/2013)  . Stroke [facility]) ?2009   Left pontine stroke; denies residual on 08/22/2013   Social History   Socioeconomic History  . Marital status: Married  Spouse name: Not on file  . Number of children: 3  . Years of education: college  . Highest education level: Some college, no degree  Occupational History  . Occupation: Retired    Fish farm manager: [employer]  . Financial resource strain: Not on file  . Food insecurity    Worry: Not on file    Inability: Not on file  . Transportation needs    Medical: Not on file    Non-medical: Not on file  Tobacco Use  . Smoking status: Former Smoker    Packs/Bubeck: 0.06    Years: 26.00    Pack years: 1.56    Types: Cigarettes    Quit date: 03/23/1993    Years since quitting: 26.2  . Smokeless tobacco: Never Used  Substance and Sexual Activity  . Alcohol use: Not Currently  . Drug use: No  . Sexual activity: Yes  Lifestyle  . Physical activity    Days per week:  Not on file    Minutes per session: Not on file  . Stress: Not on file  Relationships  . Social Herbalist on phone: Not on file    Gets together: Not on file    Attends religious service: Not on file    Active member of club or organization: Not on file    Attends meetings of clubs or organizations: Not on file    Relationship status: Not on file  Other Topics Concern  . Not on file  Social History Narrative   Patient is married, has 3 children   Patient is right handed   Education level is some college   Caffeine consumption is 1 cup daily   Pt lives at home with spouse one story home   Family History  Problem Relation Age of Onset  . Heart disease Mother   . Heart disease Father   . Colon cancer Brother   . Heart disease Brother   . Heart disease Brother   . Healthy Son   . Healthy Daughter   . Healthy Daughter    Scheduled Meds: . amLODipine  10 mg Per Tube Daily  . aspirin  81 mg Per Tube Daily  . Carbidopa-Levodopa ER  1 tablet Per Tube 5 X Daily  . chlorhexidine gluconate (MEDLINE KIT)  15 mL Mouth Rinse BID  . Chlorhexidine Gluconate Cloth  6 each Topical Daily  . clopidogrel  75 mg Per Tube Daily  . docusate  50 mg Per Tube Daily  . feeding supplement (PRO-STAT SUGAR FREE 64)  30 mL Per Tube TID  . free water  300 mL Per Tube Q4H  . heparin  5,000 Units Subcutaneous Q8H  . mouth rinse  15 mL Mouth Rinse 10 times per [place]  . pantoprazole sodium  40 mg Per Tube Daily  . Pimavanserin Tartrate  34 mg Per Tube Daily  . pyridostigmine  60 mg Per Tube BID  . rosuvastatin  10 mg Per Tube q1800   Continuous Infusions: . sodium chloride Stopped (06/10/19 0000)  . sodium chloride 10 mL/hr at 06/20/19 1200  . feeding supplement (VITAL AF 1.2 CAL) 1,000 mL (06/20/19 0600)   PRN Meds:.Place/Maintain arterial line **AND** sodium chloride, sodium chloride, hydrALAZINE, polyethylene glycol, sodium chloride flush Medications Prior to Admission:  Prior to  Admission medications   Medication Sig Start Date End Date Taking? Authorizing Provider  aspirin 81 MG EC tablet Take 1 tablet (81 mg total) by mouth daily. Swallow  whole. 11/23/12  Yes Charolette Forward, MD  Carbidopa-Levodopa ER (RYTARY) 48.75-195 MG CPCR Take 1 tablet by mouth 5 (five) times daily. Patient taking differently: Take 1 tablet by mouth 5 (five) times daily. 7am, 10am, 1pm, 4pm, 7pm 04/22/19  Yes Tat, Eustace Quail, DO  clopidogrel (PLAVIX) 75 MG tablet Take 75 mg by mouth daily.   Yes [provider]  diphenhydramine-acetaminophen (TYLENOL PM) 25-500 MG TABS tablet Take 1-2 tablets by mouth 2 (two) times daily as needed (pain).   Yes [provider]  ipratropium (ATROVENT) 0.06 % nasal spray PLACE 2 SPRAYS IN EACH NOSTRIL FOUR TIMES DAILY FOR NASAL CONGESTION Patient taking differently: Place 2 sprays into both nostrils 4 (four) times daily as needed for rhinitis.  07/29/16  Yes [name], MD  mirabegron ER (MYRBETRIQ) 50 MG TB24 tablet Take 1 tablet (50 mg total) by mouth daily. 03/10/19  Yes [name], MD  nebivolol (BYSTOLIC) 10 MG tablet Take 10 mg by mouth daily.   Yes [provider]  NUPLAZID 34 MG CAPS TAKE 1 CAPSULE BY MOUTH ONCE DAILY Patient taking differently: Take 34 mg by mouth daily.  04/12/19  Yes Tat, Eustace Quail, DO  oxyCODONE (OXY IR/ROXICODONE) 5 MG immediate release tablet Take 1 tablet (5 mg total) by mouth every 6 (six) hours as needed for moderate pain. 06/03/19  Yes Collins, Emma M, PA-C  pyridostigmine (MESTINON) 60 MG tablet TAKE 1 TABLET BY MOUTH AT 8 AM, 1 TABLET AT 2 PM, AND 1 TABLET AT 8 PM Patient taking differently: Take 60 mg by mouth See admin instructions. Take one tablet (60 mg) by mouth twice daily - 7am and 4pm 02/23/19  Yes Patel, Donika K, DO  rosuvastatin (CRESTOR) 10 MG tablet TAKE 1 TABLET(10 MG) BY MOUTH DAILY Patient taking differently: Take 10 mg by mouth daily.  05/24/19  Yes [name], MD   vitamin B-12 (CYANOCOBALAMIN) 1000 MCG tablet Take 1,000 mcg by mouth daily.   Yes [provider]  Carbidopa-Levodopa ER (RYTARY) 48.75-195 MG CPCR Take 1 capsule by mouth 5 (five) times daily. Patient not taking: Reported on 06/06/2019 04/22/19   Tat, Eustace Quail, DO   No Known Allergies Review of Systems  Unable to perform ROS: Intubated    Physical Exam Vitals signs and nursing note reviewed.  Constitutional:      General: He is not in acute distress.    Appearance: He is normal weight.     Comments: Intubated/ventilated, no sedation   HENT:     Head: Normocephalic and atraumatic.     Vital Signs: BP 130/68   Pulse 67   Temp 98.9 F (37.2 C) (Axillary)   Resp 13   Ht 6' (1.829 m)   Wt 85.6 kg   SpO2 100%   BMI 25.59 kg/m  Pain Scale: CPOT   Pain Score: 0-No pain   SpO2: SpO2: 100 % O2 Device:SpO2: 100 % O2 Flow Rate: .O2 Flow Rate (L/min): 2 L/min  IO: Intake/output summary:   Intake/Output Summary (Last 24 hours) at 06/20/2019 1247 Last data filed at 06/20/2019 1200 Gross per 24 hour  Intake 3359.68 ml  Output 4000 ml  Net -640.32 ml    LBM: Last BM Date: 06/15/19 Baseline Weight: Weight: 82.1 kg Most recent weight: Weight: 85.6 kg     Palliative Assessment/Data:     Time In: 1350      And  1520 Time Out: 1420  1620 Time Total: 30 + 60  = 90 minutes    Greater than 50%  of this time was spent counseling and coordinating care related to the above assessment and plan.  Signed by: Drue Novel, NP   Please contact Palliative Medicine Team phone at [phone] for questions and concerns.  For individual provider: See Shea Evans

## 2019-06-20 NOTE — TOC Progression Note (Signed)
Transition of Care [facility]) - Progression Note    Patient Details  Name: Derek Harrell MRN: [id number] Date of Birth: 12-Jan-1946  Transition of Care [facility]) CM/SW Contact  Sharlet Salina Mila Homer, LCSW Phone Number: 06/20/2019, 12:01 PM  Clinical Narrative:  CSW received call from patient's wife, Mrs. Cardinal [phone]) regarding her husband. Mrs. Cavendish wanted to know the process for transferring her husband to a [place] hospital or hospital in [place]. Per wife, she has talked with her family and they want a second opinion. Mrs. Gorley reported that she has talked with someone at the [place] office and was given [phone], ext. [phone] as the contact number.  CSW went to unit and talked with patient's nurse regarding call and MD (who was on the unit and on the phone) was talking with patient's wife per RN. CSW will continue to follow and provide CSW intervention services as needed.       Expected Discharge Plan and Services  n/a at this time.                                             Social Determinants of Health (SDOH) Interventions  None needed at this time  Readmission Risk Interventions Readmission Risk Prevention Plan 06/03/2019  Transportation Screening Complete  PCP or Specialist Appt within 3-5 Days Complete  HRI or [place] Complete  Social Work Consult for [place] Planning/Counseling Complete  Palliative Care Screening Not Applicable  Medication Review Press photographer) Complete  Some recent data might be hidden

## 2019-06-20 NOTE — Plan of Care (Signed)
Interdisciplinary Goals of Care Family Meeting   Date carried out:: 06/20/2019  Location of the meeting: Conference room  Member's involved: Nurse Practitioner, Family Member or next of kin and Palliative care team member.  Derek Axe, NP from [place] or acting medical decision maker: wife, Derek Harrell in person and patient's three children by phone (Derek Harrell, Derek Harrell)  Discussion: We discussed patients declining functional history for the last two years, chronic health conditions, and current health conditions.  We then discussed goals of care for [name] moving forward of comfort care vs moving forward with long term life support and possible complications associated with.  We are [name] 10 of his ETT.  Family have requested earlier in the [name] transfer to tertiary care facility for second opinion.  [facility] was contacted by Dr. Tamala Julian who spoke with Dr. Gareth Eagle; patient not accepted.  This was relayed to the family.  Family not ready at this time to make a decision.  Needs more time. All questions answered at this time.   Code status: Full Code  Disposition: Continue current acute care, pending cumulative family decision on how to move forward- comfort care vs trach/ PEG.  Family leaning towards trach/ PEG.  I have asked for CSW to assist in getting a list of possible vent/ SNF facilities available for family per request of wife.    Time spent for the meeting: 55 mins    Kennieth Rad, MSN, AGACNP-BC [facility] 06/20/2019, 4:19 PM

## 2019-06-21 LAB — RENAL FUNCTION PANEL
Albumin: 2.2 g/dL — ABNORMAL LOW (ref 3.5–5.0)
Anion gap: 11 (ref 5–15)
BUN: 45 mg/dL — ABNORMAL HIGH (ref 8–23)
CO2: 25 mmol/L (ref 22–32)
Calcium: 8.5 mg/dL — ABNORMAL LOW (ref 8.9–10.3)
Chloride: 103 mmol/L (ref 98–111)
Creatinine, Ser: 0.86 mg/dL (ref 0.61–1.24)
GFR calc Af Amer: >60 mL/min (ref >60)
GFR calc non Af Amer: >60 mL/min (ref >60)
Glucose, Bld: 116 mg/dL — ABNORMAL HIGH (ref 70–99)
Phosphorus: 3.9 mg/dL (ref 2.5–4.6)
Potassium: 3.7 mmol/L (ref 3.5–5.1)
Sodium: 139 mmol/L (ref 135–145)

## 2019-06-21 LAB — TSH: TSH: 2.428 u[IU]/mL (ref 0.350–4.500)

## 2019-06-21 LAB — CBC
HCT: 26.7 % — ABNORMAL LOW (ref 39.0–52.0)
Hemoglobin: 8.8 g/dL — ABNORMAL LOW (ref 13.0–17.0)
MCH: 27.2 pg (ref 26.0–34.0)
MCHC: 33 g/dL (ref 30.0–36.0)
MCV: 82.4 fL (ref 80.0–100.0)
Platelets: 244 10*3/uL (ref 150–400)
RBC: 3.24 MIL/uL — ABNORMAL LOW (ref 4.22–5.81)
RDW: 14.2 % (ref 11.5–15.5)
WBC: 8.1 10*3/uL (ref 4.0–10.5)
nRBC: 0 % (ref 0.0–0.2)

## 2019-06-21 LAB — GLUCOSE, CAPILLARY
Glucose-Capillary: 93 mg/dL (ref 70–99)
Glucose-Capillary: 110 mg/dL — ABNORMAL HIGH (ref 70–99)
Glucose-Capillary: 104 mg/dL — ABNORMAL HIGH (ref 70–99)
Glucose-Capillary: 118 mg/dL — ABNORMAL HIGH (ref 70–99)
Glucose-Capillary: 110 mg/dL — ABNORMAL HIGH (ref 70–99)
Glucose-Capillary: 118 mg/dL — ABNORMAL HIGH (ref 70–99)

## 2019-06-21 LAB — MAGNESIUM: Magnesium: 2 mg/dL (ref 1.7–2.4)

## 2019-06-21 MED ORDER — FREE WATER
250.0000 mL | Status: DC
  Administered 2019-06-21 – 2019-06-24 (×16): 250 mL

## 2019-06-21 NOTE — Progress Notes (Signed)
NAME:  Derek Harrell, MRN:  [id number], DOB:  [birth year], LOS: 69 ADMISSION DATE:  06/06/2019, CONSULTATION DATE:  11/2 REFERRING MD:  Johnney Killian, CHIEF COMPLAINT:  Hypertensive crisis and fever    Brief History   73 year old male w/ h/o parkinson's dementia.  Admitted 11/2, 3 days s/p endovascular infrarenal aortic aneurysm repair w/ worsening weakness, MS change, HTN and fever.   Patient transferred out to the floor 11/5 afternoon.  Then 11/6 early AM he suffered cardiac arrest.  RN alerted by telemetry to a run of VT.  He then became bradycardica and arrested. ACLS done for 12 mins prior to ROSC. He was unresponsive in the post-arrest setting. Transferred to [place] and intubated.   Past Medical History  AAA, prior CVA, HTN, HL, MG, RLS, PD, OSA (did not tolerate CPAP), gait disorder   Significant Hospital Events   11/2 >>  admitted to ICU 11/3  >>Bradycardic arrest from mucus plugging.  11/6 >> PEA Cardiac Arrest, ROSC after 12 minutes >> began TTM to 36 11/7 >> Re-warming 11/7 completed at noon   Consults:  Vascular surgery called; recommended treating HTN and medical issues. No imaging at this point   Procedures:  ETT 11/3 >> 11/4, 11/6 >> CVC LIJ 11/06 >> Right Radial Aline 11/06 >>   Significant Diagnostic Tests:  CT head 11/2 >> Stable white matter disease.  MRI brain 11/9 >> Restricted diffusion in the occipital lobes bilaterally and in the frontal parietal lobes over the convexity bilaterally compatible with acute or subacute infarction. Atrophy and extensive chronic ischemic change in the brain. Multiple areas of chronic microhemorrhage which have progressed since 2017.  Micro Data:  Blood cultures 11/2>> negative   SARS Cov2 11/2 >> negative. Blood cultures 11/6 >> negative  Urine culture 11/6 >> negative Tracheal aspirate 11/6 >> negative  Antimicrobials:  Cefepime 11/6 >> 11/10 Vancomycin 11/6 >> 11/10  Interim history/subjective:  No significant changes Objective    Blood pressure 129/68, pulse (!) 59, temperature 98.5 F (36.9 C), temperature source Oral, resp. rate 16, height 6' (1.829 m), weight 85.8 kg, SpO2 100 %.    Vent Mode: PRVC FiO2 (%):  [30 %] 30 % Set Rate:  [16 bmp] 16 bmp Vt Set:  [620 mL] 620 mL PEEP:  [5 cmH20] 5 cmH20 Pressure Support:  [8 cmH20] 8 cmH20 Plateau Pressure:  [11 cmH20-15 cmH20] 11 cmH20   Intake/Output Summary (Last 24 hours) at 06/21/2019 0936 Last data filed at 06/21/2019 0800 Gross per 24 hour  Intake 2697.53 ml  Output 3190 ml  Net -492.47 ml   Filed Weights   06/19/19 0500 06/20/19 0500 06/21/19 0351  Weight: 86.2 kg 85.6 kg 85.8 kg    Examination:  General: Elderly male who is nonresponsive HEENT: Pupils equal reactive light Neuro: Does not follow any verbal commands CV: Heart sounds are distant PULM: Diminished in the bases GI: soft, bsx4 active  Extremities: warm/dry, 1+ edema  Skin: no rashes or lesions    Assessment & Plan:   Acute hypoxemic respiratory failure requiring mechanical ventilation concern for aspiration event with ongoing issues with swallowing and secretion management  OSA non-compliant with CPAP Mental status precludes liberation from mechanical ventilator P: Continue full vent support Most likely need tracheostomy in the near future After tracheostomy PEG pursue skilled nursing facility placement   Cardiac arrest: etiology unclear, s/p TTM Intermittent bradycardia /runs of ventricular tachycardia -Suspect multifactorial respiratory failure led to cardiopulmonary arrest -He has struggled with airway protection and  has previously suffered cardiac arrest due to mucous plug. This arrest started as a run of NSVT and he then suffered a bradycardic arrest. 12 mins duration. P: Monitor electrolytes   Hypoxic Acute encephalopathy with Parkinson's disease w/ progressive debilitation coupled with myasthenia gravis -EEG x2 consistent with encephalopathy without any active  seizures -MRI brain consistent with anoxic injury  P: Appreciate neurology's input Continue to monitor neurological status   Hypertension P: Continue Norvasc  Dysphagia   P Continue tube feeding We will need PEG in the near future   Myasthenia Gravis P: Continue home [place]  Hypernatremia Hyperchloremia Recent Labs  Lab 06/18/19 0344 06/19/19 2306 06/21/19 0704  NA 144 140 139   P: 06/21/2019 free water decreased to 250 cc every 4 hours  Best practice:  Diet: TFs at goal  Pain/Anxiety/Delirium protocol (if indicated): N/A.  AVOID ANTIPSYCHOTICS due to Parkinson's. VAP protocol (if indicated):Yes DVT prophylaxis: Heparin subq GI prophylaxis: PPI Glucose control: SSI Mobility: BR Code Status: full code  Family Communication: wife called by Dr. Tamala Julian  Disposition: ICU.    CCT  30 mins   Richardson Landry Minor ACNP Maryanna Shape PCCM

## 2019-06-21 NOTE — Plan of Care (Signed)
  Problem: Clinical Measurements: Goal: Cardiovascular complication will be avoided Outcome: Progressing   Problem: Nutrition: Goal: Adequate nutrition will be maintained Outcome: Progressing   

## 2019-06-21 NOTE — Progress Notes (Signed)
   Prior consult note from Dr. Angelena Form reviewed as well as subsequent notes and data.  Please let us know if we can be of further assistance.  Candee Furbish, MD

## 2019-06-22 ENCOUNTER — Inpatient Hospital Stay (HOSPITAL_COMMUNITY): Payer: Medicare Other

## 2019-06-22 DIAGNOSIS — J9601 Acute respiratory failure with hypoxia: Secondary | ICD-10-CM | POA: Diagnosis not present

## 2019-06-22 DIAGNOSIS — Z9911 Dependence on respirator [ventilator] status: Secondary | ICD-10-CM | POA: Diagnosis not present

## 2019-06-22 DIAGNOSIS — I161 Hypertensive emergency: Secondary | ICD-10-CM | POA: Diagnosis not present

## 2019-06-22 DIAGNOSIS — G20 Parkinson's disease: Secondary | ICD-10-CM | POA: Diagnosis not present

## 2019-06-22 DIAGNOSIS — Z7189 Other specified counseling: Secondary | ICD-10-CM | POA: Diagnosis not present

## 2019-06-22 DIAGNOSIS — Z515 Encounter for palliative care: Secondary | ICD-10-CM | POA: Diagnosis not present

## 2019-06-22 DIAGNOSIS — I469 Cardiac arrest, cause unspecified: Secondary | ICD-10-CM | POA: Diagnosis not present

## 2019-06-22 LAB — GLUCOSE, CAPILLARY
Glucose-Capillary: 109 mg/dL — ABNORMAL HIGH (ref 70–99)
Glucose-Capillary: 109 mg/dL — ABNORMAL HIGH (ref 70–99)
Glucose-Capillary: 111 mg/dL — ABNORMAL HIGH (ref 70–99)
Glucose-Capillary: 98 mg/dL (ref 70–99)
Glucose-Capillary: 109 mg/dL — ABNORMAL HIGH (ref 70–99)
Glucose-Capillary: 104 mg/dL — ABNORMAL HIGH (ref 70–99)

## 2019-06-22 LAB — BASIC METABOLIC PANEL
Anion gap: 9 (ref 5–15)
BUN: 46 mg/dL — ABNORMAL HIGH (ref 8–23)
CO2: 26 mmol/L (ref 22–32)
Calcium: 8.5 mg/dL — ABNORMAL LOW (ref 8.9–10.3)
Chloride: 104 mmol/L (ref 98–111)
Creatinine, Ser: 0.86 mg/dL (ref 0.61–1.24)
GFR calc Af Amer: >60 mL/min (ref >60)
GFR calc non Af Amer: >60 mL/min (ref >60)
Glucose, Bld: 109 mg/dL — ABNORMAL HIGH (ref 70–99)
Potassium: 4 mmol/L (ref 3.5–5.1)
Sodium: 139 mmol/L (ref 135–145)

## 2019-06-22 LAB — PHOSPHORUS: Phosphorus: 3.9 mg/dL (ref 2.5–4.6)

## 2019-06-22 LAB — MAGNESIUM: Magnesium: 2.1 mg/dL (ref 1.7–2.4)

## 2019-06-22 NOTE — TOC Progression Note (Signed)
Transition of Care [facility]) - Progression Note    Patient Details  Name: Ezechiel Bensing MRN: [id number] Date of Birth: 1945-09-03  Transition of Care [facility]) CM/SW Contact  Bartholomew Crews, RN Phone Number: [phone] 06/22/2019, 3:58 PM  Clinical Narrative:    Spoke with [facility] liaison about possible transition to [place] after trach and peg placed. [facility] reviewing patient for possible LTAC transition with plans for patient to transition to SNF side at [facility].    Expected Discharge Plan: Long Term Acute Care (LTAC) Barriers to Discharge: Continued Medical Work up  Expected Discharge Plan and Services Expected Discharge Plan: [place] (LTAC)                                               Social Determinants of Health (SDOH) Interventions    Readmission Risk Interventions Readmission Risk Prevention Plan 06/03/2019  Transportation Screening Complete  PCP or Specialist Appt within 3-5 Days Complete  HRI or [place] Complete  Social Work Consult for [place] Planning/Counseling Complete  Palliative Care Screening Not Applicable  Medication Review Press photographer) Complete  Some recent data might be hidden

## 2019-06-22 NOTE — Progress Notes (Signed)
Palliative: Derek Harrell is lying quietly in bed.  He is intubated/ventilated but not requiring sedation.  He remains unchanged, no meaningful improvements.  He is unable to make his basic needs known.  There is no family at bedside at this time.  Call to wife, Derek Harrell at [phone].  Derek Harrell tells me that she has no questions at this time.  She shares that family has decided they would like to move ahead with placement of tracheostomy and PEG tube.  She states understanding that he would require long-term placement, and that this may be out of state.  Conference with medical team and bedside nursing related to patient condition, needs, family's goals of care discussion.   Plan: Family desires to move forward with trach/PEG, long-term/SNF trach placement facility. Continue full scope/full code.  58 minutes Derek Axe, NP Palliative Medicine Team Team Phone # [phone] Greater than 50% of this time was spent counseling and coordinating care related to the above assessment and plan.

## 2019-06-22 NOTE — Progress Notes (Signed)
Nutrition Follow-up  DOCUMENTATION CODES:   Not applicable  INTERVENTION:   Continue tube feeds via Cortrak: - Vital AF 1.2 @ 55 ml/hr (1320 ml/[place]) - Pro-stat 30 ml TID Provides 1884 kcals, 144 g of protein and 1069 mL of free water Meets 100% estimated calorie and protein needs   NUTRITION DIAGNOSIS:   Inadequate oral intake related to inability to eat as evidenced by NPO status.  Being addressed via TF   GOAL:   Patient will meet greater than or equal to 90% of their needs  Met  MONITOR:   Vent status, Labs, Weight trends, TF tolerance, Skin, I & O's, Other (Comment)(GOC)  REASON FOR ASSESSMENT:   Ventilator, Consult Enteral/tube feeding initiation and management  ASSESSMENT:   73 yo male admitted with worsening weakness, MS change, HTN, fever. PMH includes HTN, AAA, HLD, CVD, obesity, stroke, myasthenia gravis, RLS, Parkinson's disease, dementia.  11/02 - admit 11/03 - Cortrak placed, tip gastric per Cortrak team 11/04 - extubated 11/06 - cardiac arrest, reintubated, TTM 36 11/10 - Cortrak unclogged 11/12 Cortrak tube clogged 11/13 Cortrak re-placed  Per palliative care, family has decided to proceeded with trach and PEG   Patient is currently intubated on ventilator support MV: 7.7 L/min Temp (24hrs), Avg:98.4 F (36.9 C), Min:97.9 F (36.6 C), Max:98.7 F (37.1 C)   Noted meds to be administered via OG tube; pt with capsules that must be opened prior to administration and the beads have clogged Cortrak tube on muttiple occassions  Vital AF 1.2 at 55 ml/hr, Pro-Stat 30 mL TID, free water 250 mL q 4 hours per Cortrak  Current wt 86 kg; lowest weight 80.2 kg. Admit weight 82.1 kg.   Labs: reviewed Meds: reviewed   Diet Order:   Diet Order            Diet NPO time specified  Diet effective now              EDUCATION NEEDS:   No education needs have been identified at this time  Skin:  Skin Assessment: Skin Integrity Issues: Skin  Integrity Issues:: Stage II, Incisions Stage II: buttocks Incisions: groin  Last BM:  11/18  Height:   Ht Readings from Last 1 Encounters:  06/06/19 6' (1.829 m)    Weight:   Wt Readings from Last 1 Encounters:  06/22/19 86 kg    Ideal Body Weight:  80.9 kg  BMI:  Body mass index is 25.71 kg/m.  Estimated Nutritional Needs:   Kcal:  1866  Protein:  120-160  g  Fluid:  >/= 2 L   [place]  MS, RDN, LDN, CNSC [phone] Pager  [phone] Weekend/On-Call Pager  

## 2019-06-22 NOTE — Progress Notes (Signed)
NAME:  Vijay Lister, MRN:  [id number], DOB:  1946-02-05, LOS: 24 ADMISSION DATE:  06/06/2019, CONSULTATION DATE:  11/2 REFERRING MD:  Johnney Killian, CHIEF COMPLAINT:  Hypertensive crisis and fever    Brief History   73 year old male w/ h/o parkinson's dementia.  Admitted 11/2, 3 days s/p endovascular infrarenal aortic aneurysm repair on 10/29 w/ worsening weakness, MS change, HTN and fever.   Admitted to ICU, developed third degree heart block with agonal respirations with thick mucous suctioned requiring intubation.  Extubated on 11/4.  Ttransferred out to the floor 11/5 afternoon.  Then 11/6 early AM he suffered cardiac arrest.  RN alerted by telemetry to a run of VT.  He then became bradycardica and arrested. ACLS done for 12 mins prior to ROSC. He was unresponsive in the post-arrest setting requiring intubation. Transferred to ICU for TTM.  Post rewarming, patient has had no improvement in mental status with MRI consistent with hypoxic encephalopathy.   Past Medical History  AAA, prior CVA, HTN, HL, MG, RLS, PD, OSA (did not tolerate CPAP), gait disorder   Significant Hospital Events   11/2 >>  admitted to ICU 11/3  >>Bradycardic arrest from mucus plugging.  11/4 >> extubated 11/5 >> transferred to tele/ [facility] 11/6 >> PEA Cardiac Arrest, ROSC after 12 minutes >> began TTM to 36 11/7 >> Re-warming 11/7 completed at noon   Consults:  Vascular surgery  PMT   Procedures:  ETT 11/3 >> 11/4, 11/6 >> CVC LIJ 11/06 >> 11/11 Right Radial Aline 11/06 >> 11/9  Significant Diagnostic Tests:  CT head 11/2 >> Stable white matter disease.  MRI brain 11/9 >> Restricted diffusion in the occipital lobes bilaterally and in the frontal parietal lobes over the convexity bilaterally compatible with acute or subacute infarction. Atrophy and extensive chronic ischemic change in the brain. Multiple areas of chronic microhemorrhage which have progressed since 2017.  Micro Data:  Blood cultures 11/2>> negative    SARS Cov2 11/2 >> negative. Blood cultures 11/6 >> negative  Urine culture 11/6 >> negative Tracheal aspirate 11/6 >> negative  Antimicrobials:  Cefepime 11/6 >> 11/10 Vancomycin 11/6 >> 11/10  Interim history/subjective:  Stable labs, hemodynamically stable, afebrile Weaned 8 hours yesterday 8/5 No changes overnight or significant neurological improvements Currently weaning well PSV 5/5  Objective   Blood pressure 122/75, pulse 70, temperature 98.7 F (37.1 C), temperature source Axillary, resp. rate 16, height 6' (1.829 m), weight 86 kg, SpO2 100 %.    Vent Mode: PRVC FiO2 (%):  [30 %] 30 % Set Rate:  [16 bmp] 16 bmp Vt Set:  [620 mL] 620 mL PEEP:  [5 cmH20] 5 cmH20 Pressure Support:  [8 cmH20] 8 cmH20 Plateau Pressure:  [11 cmH20-15 cmH20] 14 cmH20   Intake/Output Summary (Last 24 hours) at 06/22/2019 0849 Last data filed at 06/22/2019 0800 Gross per 24 hour  Intake 1667.25 ml  Output 2900 ml  Net -1232.75 ml   Filed Weights   06/20/19 0500 06/21/19 0351 06/22/19 0500  Weight: 85.6 kg 85.8 kg 86 kg   Examination:  General:  Elderly male on MV in NAD HEENT: MM pink/moist, pupils 4/reactive/ anicteric, ETT/ OGT (clamped), left nare cortrak  Neuro: will open eyes with stimulation and chewing movements of mouth, does not f/c, track, or move spontaneously, or respond to noxious stimuli CV: rr ir, no mumur PULM:  PSV 5/5, non labored, CTA, slightly diminished in bases, scant secretions GI: soft, bs active, ND, condom cath- cyu Extremities: warm/dry,  generalized edema  Skin: no rashes  Assessment & Plan:   Acute hypoxemic respiratory failure requiring mechanical ventilation concern for aspiration event with ongoing issues with swallowing and secretion management  OSA non-compliant with CPAP Mental status precludes liberation from mechanical ventilator P: Continue full vent support Daily SBT, mental status is barrier to extubation VAP bundle Intermittent CXR [place]  12 of ETT, awaiting decision from family to pursue trach/ peg then SNF   Cardiac arrest: etiology unclear, s/p TTM Intermittent bradycardia /runs of ventricular tachycardia -Suspect multifactorial respiratory failure led to cardiopulmonary arrest -He has struggled with airway protection and has previously suffered cardiac arrest due to mucous plug. This arrest started as a run of NSVT and he then suffered a bradycardic arrest. 12 mins duration. P: Tele monitoring Follow electrolytes    Hypoxic Acute encephalopathy with Parkinson's disease w/ progressive debilitation coupled with myasthenia gravis -EEG x2 consistent with encephalopathy without any active seizures -MRI brain consistent with anoxic injury  P: Continue to monitor neurological status Appreciate ongoing Palliative care assistance - patient remains in persistent vegetative state and given underlying neurodegenerative disorder, hope for meaningful neurologic recovery is poor.  Family are still discussing future goals.  Wife has wanted a second opinion.  [facility] contacted 11/16 and denied.  Pending family's decision.    Hypertension P: Remains hemodynamically stable  Continue Norvasc  Dysphagia   P TF at goal Pending decision from family for PEG  Myasthenia Gravis P: Continue Mestinon  Hypernatremia- resolved Hyperchloremia- resolved Recent Labs  Lab 06/19/19 2306 06/21/19 0704 06/22/19 0311  NA 140 139 139   P: Continue free water 250 cc every 4 hours  Best practice:  Diet: TFs at goal  Pain/Anxiety/Delirium protocol (if indicated): Not needed.  AVOID ANTIPSYCHOTICS due to Parkinson's. VAP protocol (if indicated):Yes DVT prophylaxis: Heparin subq GI prophylaxis: PPI Glucose control: SSI Mobility: BR Code Status: full code  Family Communication: pending update for today.  No family at bedside.  Will touch base with wife later today if she does not visit.  Disposition: ICU.    CCT 35 mins   Kennieth Rad, MSN, AGACNP-BC [place] [facility] 06/22/2019, 8:49 AM

## 2019-06-22 NOTE — Progress Notes (Addendum)
Family has decided to move forward with trach and PEG and continue full code and care. PCCM will not be available for bedside trach till next week therefore I have consulted [facility].  They will see tomorrow with tententive schedule for bedside trach/ peg on Friday.    Kennieth Rad, MSN, AGACNP-BC [place] [facility] 06/22/2019, 3:54 PM

## 2019-06-23 ENCOUNTER — Encounter (HOSPITAL_COMMUNITY): Payer: Self-pay | Admitting: Anesthesiology

## 2019-06-23 ENCOUNTER — Encounter: Payer: Self-pay | Admitting: Surgery

## 2019-06-23 DIAGNOSIS — G20 Parkinson's disease: Secondary | ICD-10-CM | POA: Diagnosis not present

## 2019-06-23 DIAGNOSIS — J9601 Acute respiratory failure with hypoxia: Secondary | ICD-10-CM | POA: Diagnosis not present

## 2019-06-23 DIAGNOSIS — I469 Cardiac arrest, cause unspecified: Secondary | ICD-10-CM | POA: Diagnosis not present

## 2019-06-23 DIAGNOSIS — I161 Hypertensive emergency: Secondary | ICD-10-CM | POA: Diagnosis not present

## 2019-06-23 LAB — GLUCOSE, CAPILLARY
Glucose-Capillary: 97 mg/dL (ref 70–99)
Glucose-Capillary: 111 mg/dL — ABNORMAL HIGH (ref 70–99)
Glucose-Capillary: 100 mg/dL — ABNORMAL HIGH (ref 70–99)
Glucose-Capillary: 90 mg/dL (ref 70–99)
Glucose-Capillary: 112 mg/dL — ABNORMAL HIGH (ref 70–99)

## 2019-06-23 LAB — CBC
HCT: 25.7 % — ABNORMAL LOW (ref 39.0–52.0)
Hemoglobin: 8.4 g/dL — ABNORMAL LOW (ref 13.0–17.0)
MCH: 27.1 pg (ref 26.0–34.0)
MCHC: 32.7 g/dL (ref 30.0–36.0)
MCV: 82.9 fL (ref 80.0–100.0)
Platelets: 255 10*3/uL (ref 150–400)
RBC: 3.1 MIL/uL — ABNORMAL LOW (ref 4.22–5.81)
RDW: 14.3 % (ref 11.5–15.5)
WBC: 7.9 10*3/uL (ref 4.0–10.5)
nRBC: 0 % (ref 0.0–0.2)
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
## 2019-06-23 NOTE — TOC Progression Note (Signed)
Transition of Care [facility]) - Progression Note    Patient Details  Name: Harsha Alcivar MRN: [id number] Date of Birth: 1946/07/25  Transition of Care [facility]) CM/SW Contact  Bartholomew Crews, RN Phone Number: [phone] 06/23/2019, 12:38 PM  Clinical Narrative:    Spoke with liaison at [facility]. [facility] is unable to offer LTAC bed d/t poor prognosis and lacking of eligibility for LTAC needs.   Disposition will be for vent snf. Patient's spouse will need to apply for long term medicaid. Most SNFs will require that the trach be at least 30 days before accepting patient.   TOC team following for transition needs.   Expected Discharge Plan: Long Term Acute Care (LTAC) Barriers to Discharge: Continued Medical Work up  Expected Discharge Plan and Services Expected Discharge Plan: [place] (LTAC)                                               Social Determinants of Health (SDOH) Interventions    Readmission Risk Interventions Readmission Risk Prevention Plan 06/03/2019  Transportation Screening Complete  PCP or Specialist Appt within 3-5 Days Complete  HRI or [place] Complete  Social Work Consult for [place] Planning/Counseling Complete  Palliative Care Screening Not Applicable  Medication Review [employer]) Complete  Some recent data might be hidden

## 2019-06-23 NOTE — Progress Notes (Signed)
NAME:  Derek Harrell, MRN:  [id number], DOB:  14-Jul-1946, LOS: 55 ADMISSION DATE:  06/06/2019, CONSULTATION DATE:  11/2 REFERRING MD:  Johnney Killian, CHIEF COMPLAINT:  Hypertensive crisis and fever    Brief History   73 year old male w/ h/o parkinson's dementia.  Admitted 11/2, 3 days s/p endovascular infrarenal aortic aneurysm repair on 10/29 w/ worsening weakness, MS change, HTN and fever.   Admitted to ICU, developed third degree heart block with agonal respirations with thick mucous suctioned requiring intubation.  Extubated on 11/4.  Ttransferred out to the floor 11/5 afternoon.  Then 11/6 early AM he suffered cardiac arrest.  RN alerted by telemetry to a run of VT.  He then became bradycardica and arrested. ACLS done for 12 mins prior to ROSC. He was unresponsive in the post-arrest setting requiring intubation. Transferred to ICU for TTM.  Post rewarming, patient has had no improvement in mental status with MRI consistent with hypoxic encephalopathy.   Past Medical History  AAA, prior CVA, HTN, HL, MG, RLS, PD, OSA (did not tolerate CPAP), gait disorder   Significant Hospital Events   11/2 >>  admitted to ICU 11/3  >>Bradycardic arrest from mucus plugging.  11/4 >> extubated 11/5 >> transferred to tele/ [facility] 11/6 >> PEA Cardiac Arrest, ROSC after 12 minutes >> began TTM to 36 11/7 >> Re-warming 11/7 completed at noon   Consults:  Vascular surgery  PMT   Procedures:  ETT 11/3 >> 11/4, 11/6 >> CVC LIJ 11/06 >> 11/11 Right Radial Aline 11/06 >> 11/9  Significant Diagnostic Tests:  CT head 11/2 >> Stable white matter disease.  MRI brain 11/9 >> Restricted diffusion in the occipital lobes bilaterally and in the frontal parietal lobes over the convexity bilaterally compatible with acute or subacute infarction. Atrophy and extensive chronic ischemic change in the brain. Multiple areas of chronic microhemorrhage which have progressed since 2017.  Micro Data:  Blood cultures 11/2>> negative    SARS Cov2 11/2 >> negative. Blood cultures 11/6 >> negative  Urine culture 11/6 >> negative Tracheal aspirate 11/6 >> negative  Antimicrobials:  Cefepime 11/6 >> 11/10 Vancomycin 11/6 >> 11/10  Interim history/subjective:  Stable labs, hemodynamically stable, afebrile No changes overnight, no neurological changes On pressure support, tolerating it well  Objective   Blood pressure 112/68, pulse 64, temperature 98.5 F (36.9 C), temperature source Axillary, resp. rate 13, height 6' (1.829 m), weight 85 kg, SpO2 100 %.    Vent Mode: CPAP;PSV FiO2 (%):  [30 %] 30 % Set Rate:  [16 bmp] 16 bmp Vt Set:  [620 mL] 620 mL PEEP:  [5 cmH20] 5 cmH20 Pressure Support:  [5 cmH20] 5 cmH20 Plateau Pressure:  [11 cmH20-18 cmH20] 11 cmH20   Intake/Output Summary (Last 24 hours) at 06/23/2019 [id number] Last data filed at 06/23/2019 0600 Gross per 24 hour  Intake 2275 ml  Output 2475 ml  Net -200 ml   Filed Weights   06/21/19 0351 06/22/19 0500 06/23/19 0441  Weight: 85.8 kg 86 kg 85 kg   Examination:  General: Elderly, does not appear to be in distress HEENT: Pupils size 4, reactive, anicteric Neuro: No response to noxious stimuli, actual movement, does not track CV: S1-S2, no murmur PULM:  clear to auscultation GI: soft, bs active, ND, condom cath- cyu  Extremities: warm/dry, generalized edema  Skin: no skin rashes  Chest x-ray reviewed by myself 06/22/2019-stable findings, no new infiltrate, ET tube in good position  Assessment & Plan:   Acute hypoxemic  respiratory failure require mechanical ventilation Concern for aspiration event with ongoing issues with swallowing and secretion management Obstructive sleep apnea, noncompliant with CPAP Mental status precludes liberation from mechanical ventilator Continue full vent support Daily SBT VAP bundle Intermittent chest x-ray Hamid 13 of ETT Surgery consulted for possible PEG and trach Plan will be transition to skilled nursing  facility   Cardiac arrest, unclear etiology Intermittent bradycardia Runs of SVT Did have a bradycardic arrest lasting 12 minutes -Telemetry monitoring -Trend electrolytes  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  Avoid antipsychotics-Parkinson's VAP protocol (if indicated): In place DVT prophylaxis: Subcu heparin GI prophylaxis: Continue PPI Glucose control: SSI Mobility: BR Code Status: full code  Family Communication: Will update Disposition: ICU.    The patient is critically ill with multiple organ systems failure and requires high complexity decision making for assessment and support, frequent evaluation and titration of therapies, application of advanced monitoring technologies and extensive interpretation of multiple databases. Critical Care Time devoted to patient care services described in this note independent of APP/resident time (if applicable)  is 32 minutes.   Sherrilyn Rist MD [facility] Personal pager: [phone] If unanswered, please page CCM On-call: [phone]

## 2019-06-24 ENCOUNTER — Encounter (HOSPITAL_COMMUNITY)
Admission: EM | Disposition: A | Discharge status: Hospice | Payer: Self-pay | Source: Home / Self Care | Attending: Internal Medicine

## 2019-06-24 ENCOUNTER — Inpatient Hospital Stay (HOSPITAL_COMMUNITY): Payer: Medicare Other

## 2019-06-24 DIAGNOSIS — Z515 Encounter for palliative care: Secondary | ICD-10-CM

## 2019-06-24 DIAGNOSIS — Z7189 Other specified counseling: Secondary | ICD-10-CM | POA: Diagnosis not present

## 2019-06-24 DIAGNOSIS — J9601 Acute respiratory failure with hypoxia: Secondary | ICD-10-CM | POA: Diagnosis not present

## 2019-06-24 DIAGNOSIS — I469 Cardiac arrest, cause unspecified: Secondary | ICD-10-CM | POA: Diagnosis not present

## 2019-06-24 DIAGNOSIS — G20 Parkinson's disease: Secondary | ICD-10-CM | POA: Diagnosis not present

## 2019-06-24 DIAGNOSIS — I161 Hypertensive emergency: Secondary | ICD-10-CM | POA: Diagnosis not present

## 2019-06-24 HISTORY — PX: PEG PLACEMENT: SHX5437

## 2019-06-24 HISTORY — PX: ESOPHAGOGASTRODUODENOSCOPY: SHX5428

## 2019-06-24 LAB — CBC WITH DIFFERENTIAL/PLATELET
Abs Immature Granulocytes: 0.05 10*3/uL (ref 0.00–0.07)
Basophils Absolute: 0 10*3/uL (ref 0.0–0.1)
Basophils Relative: 0 %
Eosinophils Absolute: 0.1 10*3/uL (ref 0.0–0.5)
Eosinophils Relative: 1 %
HCT: 27.4 % — ABNORMAL LOW (ref 39.0–52.0)
Hemoglobin: 8.8 g/dL — ABNORMAL LOW (ref 13.0–17.0)
Immature Granulocytes: 0 %
Lymphocytes Relative: 6 %
Lymphs Abs: 0.8 10*3/uL (ref 0.7–4.0)
MCH: 27.1 pg (ref 26.0–34.0)
MCHC: 32.1 g/dL (ref 30.0–36.0)
MCV: 84.3 fL (ref 80.0–100.0)
Monocytes Absolute: 0.8 10*3/uL (ref 0.1–1.0)
Monocytes Relative: 5 %
Neutro Abs: 12.7 10*3/uL — ABNORMAL HIGH (ref 1.7–7.7)
Neutrophils Relative %: 88 %
Platelets: 253 10*3/uL (ref 150–400)
RBC: 3.25 MIL/uL — ABNORMAL LOW (ref 4.22–5.81)
RDW: 14.3 % (ref 11.5–15.5)
WBC: 14.5 10*3/uL — ABNORMAL HIGH (ref 4.0–10.5)
nRBC: 0 % (ref 0.0–0.2)

## 2019-06-24 LAB — GLUCOSE, CAPILLARY
Glucose-Capillary: 106 mg/dL — ABNORMAL HIGH (ref 70–99)
Glucose-Capillary: 112 mg/dL — ABNORMAL HIGH (ref 70–99)
Glucose-Capillary: 122 mg/dL — ABNORMAL HIGH (ref 70–99)
Glucose-Capillary: 90 mg/dL (ref 70–99)
Glucose-Capillary: 93 mg/dL (ref 70–99)
Glucose-Capillary: 94 mg/dL (ref 70–99)
Glucose-Capillary: 96 mg/dL (ref 70–99)

## 2019-06-24 LAB — FIBRINOGEN: Fibrinogen: 384 mg/dL (ref 210–475)

## 2019-06-24 LAB — APTT: aPTT: 20 seconds — ABNORMAL LOW (ref 24–36)

## 2019-06-24 LAB — PROTIME-INR
INR: 1.2 (ref 0.8–1.2)
Prothrombin Time: 15.2 seconds (ref 11.4–15.2)

## 2019-06-24 SURGERY — CREATION, TRACHEOSTOMY, PERCUTANEOUS
Anesthesia: LOCAL

## 2019-06-24 SURGERY — EGD (ESOPHAGOGASTRODUODENOSCOPY)
Anesthesia: Moderate Sedation

## 2019-06-24 MED ORDER — FREE WATER
250.0000 mL | Freq: Three times a day (TID) | Status: DC
  Administered 2019-06-24 – 2019-06-30 (×17): 250 mL

## 2019-06-24 MED ORDER — FENTANYL CITRATE (PF) 100 MCG/2ML IJ SOLN
100.0000 ug | Freq: Once | INTRAMUSCULAR | Status: AC
  Administered 2019-06-24: 14:00:00 100 ug via INTRAVENOUS

## 2019-06-24 MED ORDER — FENTANYL CITRATE (PF) 100 MCG/2ML IJ SOLN
INTRAMUSCULAR | Status: AC
  Administered 2019-06-24: 100 ug via INTRAVENOUS
  Filled 2019-06-24: qty 2

## 2019-06-24 MED ORDER — ROCURONIUM BROMIDE 50 MG/5ML IV SOLN
100.0000 mg | Freq: Once | INTRAVENOUS | Status: AC
  Administered 2019-06-24: 100 mg via INTRAVENOUS
  Filled 2019-06-24: qty 10

## 2019-06-24 MED ORDER — MIDAZOLAM HCL 2 MG/2ML IJ SOLN
10.0000 mg | Freq: Once | INTRAMUSCULAR | Status: AC
  Administered 2019-06-24: 14:00:00 10 mg via INTRAVENOUS
  Filled 2019-06-24: qty 10

## 2019-06-24 MED ORDER — ROCURONIUM BROMIDE 10 MG/ML (PF) SYRINGE
PREFILLED_SYRINGE | INTRAVENOUS | Status: AC
  Filled 2019-06-24: qty 10

## 2019-06-24 MED ORDER — SODIUM BICARBONATE 650 MG PO TABS
650.0000 mg | ORAL_TABLET | Freq: Once | ORAL | Status: DC
  Filled 2019-06-24: qty 1

## 2019-06-24 MED ORDER — ROCURONIUM BROMIDE 10 MG/ML (PF) SYRINGE
PREFILLED_SYRINGE | INTRAVENOUS | Status: AC
  Administered 2019-06-24: 100 mg
  Filled 2019-06-24: qty 10

## 2019-06-24 MED ORDER — MIDAZOLAM HCL 2 MG/2ML IJ SOLN
INTRAMUSCULAR | Status: AC
  Administered 2019-06-24: 10 mg via INTRAVENOUS
  Filled 2019-06-24: qty 10

## 2019-06-24 MED ORDER — PANCRELIPASE (LIP-PROT-AMYL) 10440-39150 UNITS PO TABS
20880.0000 [IU] | ORAL_TABLET | Freq: Once | ORAL | Status: DC
  Filled 2019-06-24: qty 2

## 2019-06-24 NOTE — Progress Notes (Signed)
eLink Physician-Brief Progress Note Patient Name: Derek Harrell DOB: 03/22/1946 MRN: [id number]   Date of Service  06/24/2019  HPI/Events of Note  Bleeding from trach site - Patient on Heparin [place] and SCD's.  eICU Interventions  Will order: 1. D/C Heparin . 2. CBC with platelets, PT/INR and PTT now.  3. Will ask ground team to evaluate at bedside.      Intervention Category Major Interventions: Hemorrhage - evaluation and management  Chantrell Apsey [place] 06/24/2019, 9:45 PM

## 2019-06-24 NOTE — Progress Notes (Signed)
Called to speak to wife regarding consent for trach/PEG. Attempted to reach her at [phone], [phone], [phone], and [phone] (fourth number provided by the patient's son). Left voicemails at the first three numbers. I was told she was not present at the fourth number. Requested the patient's son try to reach her and notify her I am trying to get in contact with her.   Jesusita Oka, MD General and [facility]

## 2019-06-24 NOTE — Progress Notes (Signed)
Trach and PEG placed at bedside. Pt continuing to bleed from both sites. MD notified of bleeding. Awaiting assessment or new orders at this time.

## 2019-06-24 NOTE — Progress Notes (Signed)
RT assisted Dr. Bobbye Morton and PA with bedside trach. #6 cuffed Shiley trach placed. Pt placed on ventilator with good return volumes and bilateral breath sounds. SpO2 remained 100% during procedure. Pt tolerated well. CXR pending.

## 2019-06-24 NOTE — Progress Notes (Signed)
NAME:  Derek Harrell, MRN:  [id number], DOB:  Apr 09, 1946, LOS: 66 ADMISSION DATE:  06/06/2019, CONSULTATION DATE:  11/2 REFERRING MD:  Johnney Killian, CHIEF COMPLAINT:  Hypertensive crisis and fever    Brief History   73 year old male w/ h/o parkinson's dementia.  Admitted 11/2, 3 days s/p endovascular infrarenal aortic aneurysm repair on 10/29 w/ worsening weakness, MS change, HTN and fever.   Admitted to ICU, developed third degree heart block with agonal respirations with thick mucous suctioned requiring intubation.  Extubated on 11/4.  Ttransferred out to the floor 11/5 afternoon.  Then 11/6 early AM he suffered cardiac arrest.  RN alerted by telemetry to a run of VT.  He then became bradycardica and arrested. ACLS done for 12 mins prior to ROSC. He was unresponsive in the post-arrest setting requiring intubation. Transferred to ICU for TTM.  Post rewarming, patient has had no improvement in mental status with MRI consistent with hypoxic encephalopathy.   Past Medical History  AAA, prior CVA, HTN, HL, MG, RLS, PD, OSA (did not tolerate CPAP), gait disorder   Significant Hospital Events   11/2 >>  admitted to ICU 11/3  >>Bradycardic arrest from mucus plugging.  11/4 >> extubated 11/5 >> transferred to tele/ [facility] 11/6 >> PEA Cardiac Arrest, ROSC after 12 minutes >> began TTM to 36 11/7 >> Re-warming 11/7 completed at noon   Consults:  Vascular surgery  PMT   Procedures:  ETT 11/3 >> 11/4, 11/6 >> CVC LIJ 11/06 >> 11/11 Right Radial Aline 11/06 >> 11/9  Significant Diagnostic Tests:  CT head 11/2 >> Stable white matter disease.  MRI brain 11/9 >> Restricted diffusion in the occipital lobes bilaterally and in the frontal parietal lobes over the convexity bilaterally compatible with acute or subacute infarction. Atrophy and extensive chronic ischemic change in the brain. Multiple areas of chronic microhemorrhage which have progressed since 2017.  Micro Data:  Blood cultures 11/2>> negative    SARS Cov2 11/2 >> negative. Blood cultures 11/6 >> negative  Urine culture 11/6 >> negative Tracheal aspirate 11/6 >> negative  Antimicrobials:  Cefepime 11/6 >> 11/10 Vancomycin 11/6 >> 11/10  Interim history/subjective:  No significant change in neurological status 06/24/2019 plan for trach and PEG today Seek long-term care facility with trach PEG patient stabilized  Objective   Blood pressure 132/74, pulse 69, temperature 98.8 F (37.1 C), temperature source Oral, resp. rate 16, height 6' (1.829 m), weight 84.1 kg, SpO2 100 %.    Vent Mode: PRVC FiO2 (%):  [30 %] 30 % Set Rate:  [16 bmp] 16 bmp Vt Set:  [620 mL] 620 mL PEEP:  [5 cmH20] 5 cmH20 Pressure Support:  [5 cmH20] 5 cmH20 Plateau Pressure:  [13 cmH20-17 cmH20] 13 cmH20   Intake/Output Summary (Last 24 hours) at 06/24/2019 0859 Last data filed at 06/24/2019 0500 Gross per 24 hour  Intake 2430 ml  Output 2050 ml  Net 380 ml   Filed Weights   06/22/19 0500 06/23/19 0441 06/24/19 0453  Weight: 86 kg 85 kg 84.1 kg   Examination:  General: No significant change in neurological status remains unresponsive HEENT: Endotracheal tube feeding tube in place Neuro: Does not respond to verbal stimulus, disconjugate gaze, overbreathing the vent by 1. CV: Heart sounds are distant PULM: Decreased breath sounds in the base GI: soft, bsx4 active  Extremities: warm/dry, 1+ edema  Skin: no rashes or lesions     Assessment & Plan:   Acute hypoxemic respiratory failure require mechanical  ventilation Concern for aspiration event with ongoing issues with swallowing and secretion management Obstructive sleep apnea, noncompliant with CPAP Mental status precludes liberation from mechanical ventilator Unable to wean we will continue full mechanical dilatory support [place] 14 of endotracheal tube plan for tracheostomy 06/24/2019 Continue to wean as FiO2 as tolerated Pulmonary toilet He will need placement in a skilled nursing  facility once trach and PEG have been inserted   Cardiac arrest, unclear etiology Intermittent bradycardia Runs of SVT Did have a bradycardic arrest lasting 12 minutes Cardiac monitor Trend electrolytes  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  Avoid antipsychotics-Parkinson's VAP protocol (if indicated): In place DVT prophylaxis: Subcu heparin GI prophylaxis: Continue PPI Glucose control: SSI Mobility: BR Code Status: full code  Family Communication: Wife updated daily Disposition: ICU.    App CCT 30 min  Richardson Landry Keelan Tripodi ACNP Maryanna Shape PCCM

## 2019-06-24 NOTE — Progress Notes (Signed)
Palliative: Derek Harrell is lying quietly in bed.  He is intubated/ventilated but not requiring sedation.  He remains unchanged, no meaningful improvements.  He is unable to make his basic needs known. Wife, Derek Harrell is at bedside.  Family has elected trach/PEG, shceuled for today. Derek Harrell denies questions at this time.  RN CM is working with family related to long-term placement, and that this may be [place] of state.  PMT to shadow as goals are set.  PMT will reengage if Derek Harrell worsens while still hospitalized.   Plan:  Trach/PEG, long-term/SNF trach placement facility.   Continue full scope/full code.  31 minutes Derek Axe, NP Palliative Medicine Team Team Phone # [phone] Greater than 50% of this time was spent counseling and coordinating care related to the above assessment and plan.

## 2019-06-24 NOTE — Progress Notes (Signed)
Call made to RN regarding question about midline dressing change. Provided appropriate securement device. No other questions at this time.

## 2019-06-24 NOTE — Progress Notes (Signed)
Dr. Bobbye Morton notified of pt continuing to bleed at trach site. Will be up to see pt. RN notified.

## 2019-06-24 NOTE — TOC Initial Note (Signed)
Transition of Care [facility]) - Initial/Assessment Note    Patient Details  Name: Derek Harrell MRN: [id number] Date of Birth: 04/26/1946  [facility]) CM/SW Contact:    Bartholomew Crews, RN Phone Number: [phone] 06/24/2019, 1:37 PM  Clinical Narrative:                 Spoke with patient's wife, Derek Harrell, at the bedside. PTA patient was home with spouse who works full time for [employer]. Patient has a wheelchair and a walker, and did require some assistance at home.   Discussed LTAC not being an option d/t patient not having any rehab goals. Best disposition would be for patient to transition to a vent SNF when medically ready and a bed available. Further discussed the need for [name] to call DSS and initiate the Medicaid process for long term care.   [name] also asked about bring him home. NCM advised that while it may be possible that the care needed would be very demanding and 24/7.   Patient scheduled for trach and peg today.  TOC following for transition needs.   Expected Discharge Plan: Long Term Nursing Home Barriers to Discharge: Continued Medical Work up   Patient Goals and CMS Choice   CMS Medicare.gov Compare Post Acute Care list provided to:: Patient Represenative (must comment) Choice offered to / list presented to : Spouse  Expected Discharge Plan and Services Expected Discharge Plan: [place] In-house Referral: Clinical Social Work Discharge Planning Services: CM Consult   Living arrangements for the past 2 months: [name]                                      Prior Living Arrangements/Services Living arrangements for the past 2 months: Single Family Home Lives with:: Self, Spouse              Current home services: DME    Activities of Daily Living Home Assistive Devices/Equipment: Environmental consultant (specify type), Cane (specify quad or straight), Wheelchair ADL Screening (condition at time of admission) Patient's cognitive  ability adequate to safely complete daily activities?: No Is the patient deaf or have difficulty hearing?: No Does the patient have difficulty seeing, even when wearing glasses/contacts?: No(UTA) Does the patient have difficulty concentrating, remembering, or making decisions?: Yes Patient able to express need for assistance with ADLs?: No Does the patient have difficulty dressing or bathing?: Yes Independently performs ADLs?: No Does the patient have difficulty walking or climbing stairs?: Yes Weakness of Legs: Both Weakness of Arms/Hands: Both  Permission Sought/Granted                  Emotional Assessment   Attitude/Demeanor/Rapport: Intubated (Following Commands or Not Following Commands)          Admission diagnosis:  Hypertensive emergency without congestive heart failure [I16.1] Severe comorbid illness [R69] Patient Active Problem List   Diagnosis Date Noted  . Ventilator dependent ([place])   . Palliative care by specialist   . Acute respiratory failure with hypoxia ([place])   . Dementia without behavioral disturbance ([place])   . Cardiac arrest ([place])   . Goals of care, counseling/discussion   . Hypoxia   . Encephalopathy acute   . Pressure injury of skin 06/07/2019  . Hypertensive emergency without congestive heart failure 06/06/2019  . Severe comorbid illness   . AAA (abdominal aortic aneurysm) ([place]) 06/02/2019  .  Nocturia 02/21/2019  . Educated about COVID-19 virus infection 12/06/2018  . Allergic rhinitis 07/14/2018  . Humerus fracture 02/12/2018  . Enuresis 01/22/2018  . Closed fracture of left humerus 01/20/2018  . Lipoma 09/13/2016  . Bilateral hearing loss 09/11/2016  . Fatigue 06/11/2016  . RLS (restless legs syndrome) 06/09/2016  . Myasthenia gravis ([place]) 01/07/2016  . Diplopia 09/13/2015  . B12 deficiency 08/31/2015  . Routine general medical examination at a health care facility 06/01/2015  . Memory change 06/01/2015  . Insomnia 05/02/2015  .  Subjective visual disturbance of both eyes 01/23/2015  . Obesity (BMI 30-39.9) 08/30/2013  . Generalized weakness 08/21/2013  . Weakness due to cerebrovascular accident 08/21/2013  . Parkinsonian syndrome ([place]) 04/12/2013  . Abnormality of gait 04/12/2013  . AAA (abdominal aortic aneurysm) without rupture ([place]) 03/23/2013  . Essential hypertension 03/23/2013  . Hyperlipidemia 03/23/2013   PCP:  [name], MD Pharmacy:   [facility] [place], [address] [place] [address] Phone: [phone] Fax: [phone]  Mady Haagensen [place], [place] [id number] [place] [id number] Phone: [phone] Fax: [phone]     Social Determinants of Health (SDOH) Interventions    Readmission Risk Interventions Readmission Risk Prevention Plan 06/03/2019  Transportation Screening Complete  PCP or Specialist Appt within 3-5 Days Complete  HRI or [place] Complete  Social Work Consult for [place] Planning/Counseling Complete  Palliative Care Screening Not Applicable  Medication Review Press photographer) Complete  Some recent data might be hidden

## 2019-06-24 NOTE — Progress Notes (Signed)
RT changed patient's ETT holder. Patient tolerated well. No complications. RT will continue to monitor.

## 2019-06-24 NOTE — Progress Notes (Signed)
MD assessed bleeding from trach and PEG, SurgiSeal placed to PEG site and gauze changed to trach site. MD stated to watch bleeding and continue to assess for any hemodynamic instability. On going night shift RN was advised to check hemoglobin if VS trends begin to decline per MD.

## 2019-06-24 NOTE — Progress Notes (Signed)
PCCM progress:  Pt evaluated for bleeding from Trach and PEG site, placed today by surgery.  Continued oozing noted at the trach site without severe hemorrhage.  Pt hemodynamically stable, he was suctioned without significant blood return.   Labwork is pending, will check CXR and otherwise continue to monitor.

## 2019-06-25 DIAGNOSIS — G20 Parkinson's disease: Secondary | ICD-10-CM | POA: Diagnosis not present

## 2019-06-25 DIAGNOSIS — I469 Cardiac arrest, cause unspecified: Secondary | ICD-10-CM | POA: Diagnosis not present

## 2019-06-25 DIAGNOSIS — G9340 Encephalopathy, unspecified: Secondary | ICD-10-CM | POA: Diagnosis not present

## 2019-06-25 DIAGNOSIS — J9601 Acute respiratory failure with hypoxia: Secondary | ICD-10-CM | POA: Diagnosis not present

## 2019-06-25 LAB — BASIC METABOLIC PANEL
Anion gap: 9 (ref 5–15)
BUN: 45 mg/dL — ABNORMAL HIGH (ref 8–23)
CO2: 25 mmol/L (ref 22–32)
Calcium: 8.6 mg/dL — ABNORMAL LOW (ref 8.9–10.3)
Chloride: 106 mmol/L (ref 98–111)
Creatinine, Ser: 0.95 mg/dL (ref 0.61–1.24)
GFR calc Af Amer: >60 mL/min (ref >60)
GFR calc non Af Amer: >60 mL/min (ref >60)
Glucose, Bld: 128 mg/dL — ABNORMAL HIGH (ref 70–99)
Potassium: 4.3 mmol/L (ref 3.5–5.1)
Sodium: 140 mmol/L (ref 135–145)

## 2019-06-25 LAB — CBC
HCT: 27 % — ABNORMAL LOW (ref 39.0–52.0)
Hemoglobin: 8.7 g/dL — ABNORMAL LOW (ref 13.0–17.0)
MCH: 27.1 pg (ref 26.0–34.0)
MCHC: 32.2 g/dL (ref 30.0–36.0)
MCV: 84.1 fL (ref 80.0–100.0)
Platelets: 287 10*3/uL (ref 150–400)
RBC: 3.21 MIL/uL — ABNORMAL LOW (ref 4.22–5.81)
RDW: 14.4 % (ref 11.5–15.5)
WBC: 12.7 10*3/uL — ABNORMAL HIGH (ref 4.0–10.5)
nRBC: 0 % (ref 0.0–0.2)

## 2019-06-25 LAB — PREPARE RBC (CROSSMATCH)

## 2019-06-25 LAB — GLUCOSE, CAPILLARY
Glucose-Capillary: 120 mg/dL — ABNORMAL HIGH (ref 70–99)
Glucose-Capillary: 122 mg/dL — ABNORMAL HIGH (ref 70–99)
Glucose-Capillary: 141 mg/dL — ABNORMAL HIGH (ref 70–99)
Glucose-Capillary: 114 mg/dL — ABNORMAL HIGH (ref 70–99)
Glucose-Capillary: 139 mg/dL — ABNORMAL HIGH (ref 70–99)

## 2019-06-25 LAB — HEMOGLOBIN A1C
Hgb A1c MFr Bld: 5.3 % (ref 4.8–5.6)
Mean Plasma Glucose: 105.41 mg/dL

## 2019-06-25 LAB — MAGNESIUM: Magnesium: 2.1 mg/dL (ref 1.7–2.4)

## 2019-06-25 LAB — HEMOGLOBIN AND HEMATOCRIT, BLOOD
HCT: 17.3 % — ABNORMAL LOW (ref 39.0–52.0)
Hemoglobin: 5.8 g/dL — CL (ref 13.0–17.0)

## 2019-06-25 LAB — PHOSPHORUS: Phosphorus: 4.4 mg/dL (ref 2.5–4.6)

## 2019-06-25 MED ORDER — CHLORHEXIDINE GLUCONATE CLOTH 2 % EX PADS: 6.0000 | MEDICATED_PAD | Freq: Every day | CUTANEOUS | Status: DC

## 2019-06-25 MED ORDER — SODIUM CHLORIDE 0.9% IV SOLUTION
Freq: Once | INTRAVENOUS | Status: AC
  Administered 2019-06-25: 12:00:00 via INTRAVENOUS

## 2019-06-25 MED ORDER — "THROMBI-PAD 3""X3"" EX PADS"
1.0000 | MEDICATED_PAD | Freq: Once | CUTANEOUS | Status: DC
  Filled 2019-06-25: qty 1

## 2019-06-25 MED ORDER — SODIUM CHLORIDE 0.9% IV SOLUTION
Freq: Once | INTRAVENOUS | Status: AC
  Administered 2019-06-25: 08:00:00 via INTRAVENOUS

## 2019-06-25 MED ORDER — CHLORHEXIDINE GLUCONATE CLOTH 2 % EX PADS
6.0000 | MEDICATED_PAD | Freq: Every day | CUTANEOUS | Status: DC
  Administered 2019-06-26 – 2019-07-25 (×30): 6 via TOPICAL

## 2019-06-25 MED ORDER — INSULIN ASPART 100 UNIT/ML ~~LOC~~ SOLN
0.0000 [IU] | Freq: Three times a day (TID) | SUBCUTANEOUS | Status: DC
  Administered 2019-06-26 – 2019-06-29 (×4): 2 [IU] via SUBCUTANEOUS
  Administered 2019-06-30 (×2): 3 [IU] via SUBCUTANEOUS
  Administered 2019-07-01 – 2019-07-04 (×5): 2 [IU] via SUBCUTANEOUS
  Administered 2019-07-05: 3 [IU] via SUBCUTANEOUS
  Administered 2019-07-05 – 2019-07-10 (×5): 2 [IU] via SUBCUTANEOUS
  Administered 2019-07-12: 16:00:00 3 [IU] via SUBCUTANEOUS
  Administered 2019-07-12 – 2019-07-17 (×4): 2 [IU] via SUBCUTANEOUS
  Administered 2019-07-18: 17:00:00 3 [IU] via SUBCUTANEOUS
  Administered 2019-07-19 – 2019-07-21 (×3): 2 [IU] via SUBCUTANEOUS
  Administered 2019-07-22 (×2): 3 [IU] via SUBCUTANEOUS
  Administered 2019-07-23: 2 [IU] via SUBCUTANEOUS
  Administered 2019-07-23 (×2): 3 [IU] via SUBCUTANEOUS

## 2019-06-25 MED ORDER — THROMBIN 20000 UNITS EX SOLR
20000.0000 [IU] | Freq: Once | CUTANEOUS | Status: AC
  Administered 2019-06-25: 09:00:00 20000 [IU] via TOPICAL
  Filled 2019-06-25 (×2): qty 20000

## 2019-06-25 NOTE — Progress Notes (Addendum)
MD Lovick at bedside inspecting trach. Instructed to hold plavix and aspirin in the future. She also instructed to not change the trach ties and to do as little manipulation as possible. She said to only change the top gauze.  Peg tube-no gauze should go under the shield only over top.

## 2019-06-25 NOTE — Significant Event (Signed)
E-link has ordered 2U pRBC. Coags ok but was on plavix, last dose yesterday. Not tachycardic but on beta blockade. There is probably small hematoma near trach site but having ongoing oozing there and at PEG site. His belly is not tender. Bedside US FAST exam shows no free fluid in hepatorenal, splenorenal spaces, no free fluid near bladder. Placing another IV, rechecking CBC, will notify surgery as well in case he needs cautery at stoma sites.

## 2019-06-25 NOTE — Progress Notes (Signed)
Spoke with spouse  Have consent for transfusion  Will transfuse unit of platelets   Hold aspirin and plavix

## 2019-06-25 NOTE — Progress Notes (Signed)
CRITICAL VALUE ALERT  Critical Value:  hgb 5.8  Date & Time Notied:  06/25/19 5:46 AM  Provider Notified: Dr. Oletta Darter  Orders Received/Actions taken: see new orders

## 2019-06-25 NOTE — Progress Notes (Signed)
Asked MD about plavix and aspirin since patient is oozing blood from trach stoma and peg site. MD said to continue with both as scheduled.

## 2019-06-25 NOTE — Progress Notes (Signed)
CCS notified regarding continued bleeding at trach and PEG site throughout the night. Inquired about CCM's order to place thrombi pad, CCS requested not to place at this time. MD stated to continue to monitor and they would assess on rounds in am. No new orders at this time. Pts VSS. Trach dressing gently changed per MD's instruction. Nursing will continue to monitor.

## 2019-06-25 NOTE — Progress Notes (Signed)
NAME:  Derek Harrell, MRN:  [id number], DOB:  1946/03/08, LOS: 52 ADMISSION DATE:  06/06/2019, CONSULTATION DATE:  11/2 REFERRING MD:  Johnney Killian, CHIEF COMPLAINT:  Hypertensive crisis and fever    Brief History   73 year old male w/ h/o parkinson's dementia.  Admitted 11/2, 3 days s/p endovascular infrarenal aortic aneurysm repair on 10/29 w/ worsening weakness, MS change, HTN and fever.   Admitted to ICU, developed third degree heart block with agonal respirations with thick mucous suctioned requiring intubation.  Extubated on 11/4.  Ttransferred out to the floor 11/5 afternoon.  Then 11/6 early AM he suffered cardiac arrest.  RN alerted by telemetry to a run of VT.  He then became bradycardica and arrested. ACLS done for 12 mins prior to ROSC. He was unresponsive in the post-arrest setting requiring intubation. Transferred to ICU for TTM.  Post rewarming, patient has had no improvement in mental status with MRI consistent with hypoxic encephalopathy.   S/p tracheostomy/PEG tube placement 06/24/2019  Past Medical History  AAA, prior CVA, HTN, HL, MG, RLS, PD, OSA (did not tolerate CPAP), gait disorder   Significant Hospital Events   11/2 >>  admitted to ICU 11/3  >>Bradycardic arrest from mucus plugging.  11/4 >> extubated 11/5 >> transferred to tele/ [facility] 11/6 >> PEA Cardiac Arrest, ROSC after 12 minutes >> began TTM to 36 11/7 >> Re-warming 11/7 completed at noon   Consults:  Vascular surgery  PMT  General surgery  Procedures:  ETT 11/3 >> 11/4, 11/6 >> CVC LIJ 11/06 >> 11/11 Right Radial Aline 11/06 >> 11/9 PEG tube placement 11/20 Tracheostomy 11/20  Significant Diagnostic Tests:  CT head 11/2 >> Stable white matter disease.  MRI brain 11/9 >> Restricted diffusion in the occipital lobes bilaterally and in the frontal parietal lobes over the convexity bilaterally compatible with acute or subacute infarction. Atrophy and extensive chronic ischemic change in the brain. Multiple areas  of chronic microhemorrhage which have progressed since 2017.  Micro Data:  Blood cultures 11/2>> negative   SARS Cov2 11/2 >> negative. Blood cultures 11/6 >> negative  Urine culture 11/6 >> negative Tracheal aspirate 11/6 >> negative  Antimicrobials:  Cefepime 11/6 >> 11/10 Vancomycin 11/6 >> 11/10  Interim history/subjective:  Hemodynamically stable, afebrile Oozing around PEG site and tracheostomy site   Objective   Blood pressure 136/77, pulse 74, temperature 98.2 F (36.8 C), temperature source Axillary, resp. rate 16, height 6' (1.829 m), weight 84 kg, SpO2 99 %.    Vent Mode: PRVC FiO2 (%):  [30 %] 30 % Set Rate:  [16 bmp] 16 bmp Vt Set:  [620 mL-650 mL] 620 mL PEEP:  [5 cmH20] 5 cmH20 Plateau Pressure:  [14 cmH20-20 cmH20] 14 cmH20   Intake/Output Summary (Last 24 hours) at 06/25/2019 [id number] Last data filed at 06/25/2019 0600 Gross per 24 hour  Intake 571.08 ml  Output 1450 ml  Net -878.92 ml   Filed Weights   06/23/19 0441 06/24/19 0453 06/25/19 0433  Weight: 85 kg 84.1 kg 84 kg   Examination:  General: Elderly, does not appear to be in distress HEENT: Pupils about 4 mm, reactive Neuro: No response to noxious stimuli CV: S1-S2, no murmur PULM: Clear breath sounds GI: Bowel sounds appreciated Extremities: Warm/dry, generalized edema Skin: no skin rashes  Chest x-ray reviewed by myself 11/20: Left basal atelectasis  Assessment & Plan:   Acute hypoxemic respiratory failure Concern for aspiration event with ongoing issues with swallowing and secretion management Obstructive sleep  apnea, noncompliant with treatment at baseline Mental status precludes liberation from mechanical ventilator S/p PEG and trach 06/24/2019 Oozing from trach and PEG site -VAP bundle-intermittent chest x-ray -We will transition to nursing facility when able -Continue ventilator support -Monitor oozing from trach and PEG site  Cardiac arrest, unclear etiology Intermittent  bradycardia New onset of SVT Status post bradycardic arrest lasting about 12 minutes -Telemetry monitoring -Trend electrolytes  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 coupled with myasthenia gravis at baseline MRIs consistent with anoxic injury -Continue neuro monitoring -Appreciate palliative care involvement -Likelihood of meaningful recovery is poor  Hypertension -Remains hemodynamically stable -Continue Norvasc  Nutrition -Tube feeds at goal  Myasthenia gravis Continue Mestinon  Hypernatremia/hyperchloremia -continue free water  Oozing from trach and PEG site related to being on antiplatelets -We will continue to monitor -May need platelet transfusion -Await surgery evaluation today  Best practice:  Diet: TFs at goal  Pain/Anxiety/Delirium protocol (if indicated): Not needed.  Avoid antipsychotics-Parkinson's VAP protocol (if indicated): In place DVT prophylaxis: Subcu heparin GI prophylaxis: Continue PPI Glucose control: SSI Mobility: BR Code Status: full code  Family Communication: Will update Disposition: ICU.    The patient is critically ill with multiple organ systems failure and requires high complexity decision making for assessment and support, frequent evaluation and titration of therapies, application of advanced monitoring technologies and extensive interpretation of multiple databases. Critical Care Time devoted to patient care services described in this note independent of APP/resident time (if applicable)  is 35 minutes.   Sherrilyn Rist MD [place] [facility] Personal pager: [phone] If unanswered, please page CCM On-call: [phone]

## 2019-06-25 NOTE — Progress Notes (Signed)
CCS notified regarding continued bleeding and new hgb drop of 5.8. MD on call gave verbal orders for thrombin spray at bedside and rounding team will see patient first thing. VSS. Pt no signs of distress, nursing will continue to monitor.

## 2019-06-25 NOTE — Progress Notes (Signed)
[facility] Progress Note Patient Name: Derek Harrell DOB: Jun 01, 1946 MRN: [id number]   Date of Service  06/25/2019  HPI/Events of Note  Patient continue to bleed from trach site - Bedside nurse voices concern.   eICU Interventions  Will order: 1. H/H STAT. 2. Thrombi-pad to trach site. 3. Will ask ground team to re-evaluate the patient at bedside.      Intervention Category Major Interventions: Hemorrhage - evaluation and management  Sommer,Steven Eugene 06/25/2019, 3:15 AM

## 2019-06-25 NOTE — Progress Notes (Signed)
SLP Cancellation Note  Patient Details Name: Derek Harrell MRN: [id number] DOB: 09-26-1945   Cancelled treatment:       Reason Eval/Treat Not Completed: Patient not medically ready  Patient with new tracheostomy. Orders for SLP eval and treat for PMSV and swallowing received. Will follow pt closely for readiness for SLP interventions as appropriate.   Herbie Baltimore, MA CCC-SLP  Acute Rehabilitation Services Pager [phone] Office [phone]  Lynann Beaver 06/25/2019, 7:41 AM

## 2019-06-25 NOTE — Progress Notes (Signed)
eLink Physician-Brief Progress Note Patient Name: Derek Harrell DOB: 30-Aug-1945 MRN: [id number]   Date of Service  06/25/2019  HPI/Events of Note  Anemia - Hgb = 5.8 d/t bleeding from new trach.   eICU Interventions  Will order: 1. Transfuse 2 units PRBC.      Intervention Category Major Interventions: Hemorrhage - evaluation and management  Chanise Habeck Eugene 06/25/2019, 5:54 AM

## 2019-06-25 NOTE — Progress Notes (Signed)
Patient seen and examined. Continued bleeding from trach, but evidence of some clotting. Thrombin applied. PEG site similar in appearance. Surgicel in place.   Patient re-evaluated later in the [place] for continued bleeding. Has received dual AP agents today. Recommend transfusion of platelets.   Jesusita Oka, MD General and [facility]

## 2019-06-25 NOTE — Procedures (Signed)
Operative Note  Date: 06/24/2019  Procedure: percutaneous tracheostomy without bronchoscopic assistance  Pre-op diagnosis: prolonged mechanical ventilation Post-op diagnosis: same  Indication and clinical history: The patient  is a 73 y.o. year old male with prolonged mechanical ventilation and dysphagia secondary to anoxic brain injury.  Surgeon: Jesusita Oka, MD Assistant: Alferd Apa, PA  Findings:  . Specimen: none . EBL: <5cc  . Drains/Implants: #6 Shiley cuffed,   tracheostomy tube  Disposition: ICU  Description of procedure: The patient was positioned supine with a shoulder roll. Time-out was performed verifying correct patient, procedure, signature of informed consent. The patient was confirmed to be on 100% FiO2. The neck was prepped and draped in the usual sterile fashion. Palpation of neck anatomy was performed. A longitudinal incision was made in the neck and deepened down to the trachea. The pilot balloon was deflated and the endotracheal tube slowly retracted proximally until the endotracheal tube was palpated just below the cricoid cartilage. An introducer needle was inserted between the second and third tracheal rings. A guidewire was passed through the introducer needle and the needle removed. Serial dilation was performed and a #6 cuffed   Shiley and stylet were inserted over the guidewire and the guidewire and stylet removed. The inner cannula was inserted, the pilot balloon on the tracheostomy inflated, and the ventilator tubing disconnected from the endotracheal tube and connected to the tracheostomy. Chest rise, appropriate end-tidal CO2, and return tidal volumes were confirmed. The tracheostomy tube was sutured in four quadrants and a tracheostomy tie applied to the neck. The endotracheal tube was removed. All sponge and instrument counts were correct at the conclusion of the procedure. The patient tolerated the procedure well. There were no complications.      Procedure Note  Date: 06/24/2019  Procedure: esophagogastroduodenoscopy (EGD) and percutaneous endoscopic gastrostomy (PEG) tube placement  Pre-op diagnosis: dysphagia, malnutrition Post-op diagnosis: same  Indication and clinical history: As above.  Surgeon: Jesusita Oka, MD Assistant: Alferd Apa  Findings: normal EGD . Specimen: none . EBL: <5cc . Drains/Implants: PEG tube, 4cm at the skin   Disposition: ICU/PACU  Description of Procedure: The patient was positioned semi-recumbent. Time-out was performed verifying correct patient, procedure, signature of informed consent. The endoscope was inserted into the oropharynx and advanced down the esophagus into the stomach and into the duodenum. The visualized esophagus and duodenum were unremarkable. The endoscope was retracted back into the stomach and the stomach was insufflated. The stomach was inspected including retroflexion and was also normal. Transillumination was performed. The light was visible on the external skin and dimpling of the stomach was noted endoscopically with manual pressure. The abdomen was prepped and draped in the usual sterile fashion. Transillumination and dimpling were repeated and local anesthetic was infiltrated to make a skin wheal at the site of transillumination. The needle was inserted perpendicularly to the skin and the tip of the needle was visualized endoscopically. As the needle was retracted, the tract was also anesthetized. A skin nick was made at the site of the wheal and an introducer needle and sheath were inserted. The needle was removed and guidewire inserted. The guidewire was grasped by an endoscopic snare and the snare, guidewire, and endoscope retracted out of the oropharynx. The PEG tube was secured to the guidewire and retracted through the mouth and esophagus into the stomach. The PEG tube was secured with a bolster and was visualized endoscopically to spin freely circumferentially  and also be without gaps between the internal  bumper and the stomach wall. There was no evidence of bleeding. The PEG bolster was secured at 4cm at the skin and there were no gaps between the bolster and the abdominal wall. The stomach was desufflated endoscopically and the endoscope removed. The bite block was also removed. The patient tolerated the procedure well and there were no complications.   The patient may have water and medications administered via the PEG tube beginning immediately and tube feeds may be initiated four hours post-procedure.     Jesusita Oka, MD General and [facility]

## 2019-06-26 ENCOUNTER — Encounter (HOSPITAL_COMMUNITY): Payer: Self-pay | Admitting: Surgery

## 2019-06-26 DIAGNOSIS — G20 Parkinson's disease: Secondary | ICD-10-CM | POA: Diagnosis not present

## 2019-06-26 DIAGNOSIS — J9601 Acute respiratory failure with hypoxia: Secondary | ICD-10-CM | POA: Diagnosis not present

## 2019-06-26 DIAGNOSIS — I469 Cardiac arrest, cause unspecified: Secondary | ICD-10-CM | POA: Diagnosis not present

## 2019-06-26 DIAGNOSIS — G9340 Encephalopathy, unspecified: Secondary | ICD-10-CM | POA: Diagnosis not present

## 2019-06-26 LAB — PREPARE PLATELET PHERESIS: Unit division: 0

## 2019-06-26 LAB — CBC
HCT: 21.6 % — ABNORMAL LOW (ref 39.0–52.0)
Hemoglobin: 7 g/dL — ABNORMAL LOW (ref 13.0–17.0)
MCH: 27 pg (ref 26.0–34.0)
MCHC: 32.4 g/dL (ref 30.0–36.0)
MCV: 83.4 fL (ref 80.0–100.0)
Platelets: 232 10*3/uL (ref 150–400)
RBC: 2.59 MIL/uL — ABNORMAL LOW (ref 4.22–5.81)
RDW: 14.6 % (ref 11.5–15.5)
WBC: 12.4 10*3/uL — ABNORMAL HIGH (ref 4.0–10.5)
nRBC: 0 % (ref 0.0–0.2)

## 2019-06-26 LAB — GLUCOSE, CAPILLARY
Glucose-Capillary: 104 mg/dL — ABNORMAL HIGH (ref 70–99)
Glucose-Capillary: 113 mg/dL — ABNORMAL HIGH (ref 70–99)
Glucose-Capillary: 114 mg/dL — ABNORMAL HIGH (ref 70–99)
Glucose-Capillary: 126 mg/dL — ABNORMAL HIGH (ref 70–99)
Glucose-Capillary: 132 mg/dL — ABNORMAL HIGH (ref 70–99)
Glucose-Capillary: 132 mg/dL — ABNORMAL HIGH (ref 70–99)

## 2019-06-26 LAB — PREPARE RBC (CROSSMATCH)

## 2019-06-26 LAB — BPAM PLATELET PHERESIS
Blood Product Expiration Date: 202011232359
ISSUE DATE / TIME: 202011211150
Unit Type and Rh: 6200

## 2019-06-26 MED ORDER — SODIUM CHLORIDE 0.9% IV SOLUTION
Freq: Once | INTRAVENOUS | Status: AC
  Administered 2019-06-26: 08:00:00 via INTRAVENOUS

## 2019-06-26 NOTE — Progress Notes (Addendum)
NAME:  Derek Harrell, MRN:  [id number], DOB:  04-23-1946, LOS: 49 ADMISSION DATE:  06/06/2019, CONSULTATION DATE:  11/2 REFERRING MD:  Johnney Killian, CHIEF COMPLAINT:  Hypertensive crisis and fever    Brief History   73 year old male w/ h/o parkinson's dementia.  Admitted 11/2, 3 days s/p endovascular infrarenal aortic aneurysm repair on 10/29 w/ worsening weakness, MS change, HTN and fever.   Admitted to ICU, developed third degree heart block with agonal respirations with thick mucous suctioned requiring intubation.  Extubated on 11/4.  Ttransferred out to the floor 11/5 afternoon.  Then 11/6 early AM he suffered cardiac arrest.  RN alerted by telemetry to a run of VT.  He then became bradycardica and arrested. ACLS done for 12 mins prior to ROSC. He was unresponsive in the post-arrest setting requiring intubation. Transferred to ICU for TTM.  Post rewarming, patient has had no improvement in mental status with MRI consistent with hypoxic encephalopathy.   S/p tracheostomy/PEG tube placement 06/24/2019  Past Medical History  AAA, prior CVA, HTN, HL, MG, RLS, PD, OSA (did not tolerate CPAP), gait disorder   Significant Hospital Events   11/2 >>  admitted to ICU 11/3  >>Bradycardic arrest from mucus plugging.  11/4 >> extubated 11/5 >> transferred to tele/ [facility] 11/6 >> PEA Cardiac Arrest, ROSC after 12 minutes >> began TTM to 36 11/7 >> Re-warming 11/7 completed at noon   Consults:  Vascular surgery  PMT  General surgery  Procedures:  ETT 11/3 >> 11/4, 11/6 >> CVC LIJ 11/06 >> 11/11 Right Radial Aline 11/06 >> 11/9 PEG tube placement 11/20 Tracheostomy 11/20  Significant Diagnostic Tests:  CT head 11/2 >> Stable white matter disease.  MRI brain 11/9 >> Restricted diffusion in the occipital lobes bilaterally and in the frontal parietal lobes over the convexity bilaterally compatible with acute or subacute infarction. Atrophy and extensive chronic ischemic change in the brain. Multiple areas  of chronic microhemorrhage which have progressed since 2017.  Micro Data:  Blood cultures 11/2>> negative   SARS Cov2 11/2 >> negative. Blood cultures 11/6 >> negative  Urine culture 11/6 >> negative Tracheal aspirate 11/6 >> negative  Antimicrobials:  Cefepime 11/6 >> 11/10 Vancomycin 11/6 >> 11/10  Interim history/subjective:  Hemodynamically stable, afebrile, continues to ooze around tracheostomy site   Objective   Blood pressure (!) 129/53, pulse 78, temperature 99.7 F (37.6 C), temperature source Oral, resp. rate 16, height 6' (1.829 m), weight 85 kg, SpO2 99 %.    Vent Mode: PRVC FiO2 (%):  [30 %] 30 % Set Rate:  [16 bmp] 16 bmp Vt Set:  [620 mL] 620 mL PEEP:  [5 cmH20] 5 cmH20 Pressure Support:  [5 cmH20] 5 cmH20 Plateau Pressure:  [13 cmH20-15 cmH20] 15 cmH20   Intake/Output Summary (Last 24 hours) at 06/26/2019 0749 Last data filed at 06/26/2019 0600 Gross per 24 hour  Intake 2460 ml  Output 2375 ml  Net 85 ml   Filed Weights   06/24/19 0453 06/25/19 0433 06/26/19 0500  Weight: 84.1 kg 84 kg 85 kg   Examination:  General: Elderly, does not appear to be in distress HEENT: Pupils reactive Neuro: No response to noxious stimuli CV: S1-S2 appreciated, no murmur PULM: Clear breath sounds bilaterally GI: Bowel sounds appreciated Extremities: Skin is warm and dry Skin: No skin rashes  Chest x-ray reviewed by myself 11/20: Left basal atelectasis  Assessment & Plan:   Acute hypoxemic respiratory failure Concern for aspiration event with ongoing issues  with swallowing and secretion management Obstructive sleep apnea, noncompliant with treatment at baseline Mental status precludes liberation from mechanical ventilator S/p PEG and trach 06/24/2019 Active oozing from trach and PEG site -VAP bundle -Intermittent chest x-ray -Transition to nursing facility when stable -Continue full ventilator support -Monitor oozing from trach site and PEG site -We will  transfuse 2 units of blood and 1 unit of platelets  Cardiac arrest, unclear etiology Intermittent bradycardia S/p bradycardic arrest lasting about 12 minutes -Telemetry monitoring -Trend electrolytes  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 coupled with myasthenia gravis at baseline MRI is consistent with anoxic injury -Continue neuro monitoring -Appreciate palliative care involvement -Likelihood of meaningful recovery is poor  Hypertension -Remained hemodynamically stable -Continue Norvasc  Nutrition -Tube feeds at goal  Myasthenia gravis -Continue Mestinon  Oozing from trach and PEG site related to being on antiplatelets -Transfuse platelets today  Anemia from blood loss -Transfuse 2 units packed red blood cells    Best practice:  Diet: Tube feeds at goal Pain/Anxiety/Delirium protocol (if indicated): Above antipsychotics-Parkinson's VAP protocol (if indicated): In place DVT prophylaxis: Subcu heparin-held since 19th, scd GI prophylaxis: Continue PPI Glucose control: SSI Mobility: BR Code Status: full code  Family Communication: Spoke with spouse this morning 06/26/2019 Disposition: ICU.    The patient is critically ill with multiple organ systems failure and requires high complexity decision making for assessment and support, frequent evaluation and titration of therapies, application of advanced monitoring technologies and extensive interpretation of multiple databases. Critical Care Time devoted to patient care services described in this note independent of APP/resident time (if applicable)  is 35 minutes.   Sherrilyn Rist MD [place] [facility] Critical [facility] Personal pager: [phone] If unanswered, please page CCM On-call: [phone]

## 2019-06-26 NOTE — Progress Notes (Signed)
2 Days Post-Op   Subjective/Chief Complaint: Pt still with some oozing overnight to trach site. PRBC and plt trx ongoing   Objective: Vital signs in last 24 hours: Temp:  [98.3 F (36.8 C)-99.7 F (37.6 C)] 98.8 F (37.1 C) (11/22 0819) Pulse Rate:  [42-82] 73 (11/22 0820) Resp:  [15-20] 16 (11/22 0820) BP: (100-166)/(53-86) 117/69 (11/22 0820) SpO2:  [97 %-100 %] 99 % (11/22 0820) FiO2 (%):  [30 %] 30 % (11/22 0731) Weight:  [85 kg] 85 kg (11/22 0500) Last BM Date: 06/24/19  Intake/Output from previous [place]: 11/21 0701 - 11/22 0700 In: 2515 [NG/GT:2515] Out: 2375 [Urine:2375] Intake/Output this shift: Total I/O In: 55 [NG/GT:55] Out: -   Trach site with clotted blood PEG site c/d/i  Lab Results:  Recent Labs    06/25/19 0614 06/26/19 0430  WBC 12.7* 12.4*  HGB 8.7* 7.0*  HCT 27.0* 21.6*  PLT 287 232   BMET Recent Labs    06/25/19 0423  NA 140  K 4.3  CL 106  CO2 25  GLUCOSE 128*  BUN 45*  CREATININE 0.95  CALCIUM 8.6*   PT/INR Recent Labs    06/24/19 2210  LABPROT 15.2  INR 1.2   ABG No results for input(s): PHART, HCO3 in the last 72 hours.  Invalid input(s): PCO2, PO2  Studies/Results: Dg Chest Port 1 View  Result Date: 06/24/2019 CLINICAL DATA:  Respiratory failure. EXAM: PORTABLE CHEST 1 VIEW COMPARISON:  Radiograph earlier this [place] at 325 hour FINDINGS: Tracheostomy tube at the thoracic inlet. Normal heart size with unchanged mediastinal contours. No pulmonary edema, pleural effusion or pneumothorax. Mild left lung base atelectasis is increased from prior. Multiple overlying monitoring devices. No acute osseous abnormalities are seen. IMPRESSION: 1. Increasing left lung base atelectasis. 2. Tracheostomy tube tip at the thoracic inlet. Electronically Signed   By: Keith Rake M.D.   On: 06/24/2019 22:34   Dg Chest Port 1 View  Result Date: 06/24/2019 CLINICAL DATA:  73 year old male with a history of tracheostomy EXAM: PORTABLE  CHEST 1 VIEW COMPARISON:  06/22/2011 FINDINGS: Cardiomediastinal silhouette unchanged in size and contour. Interval removal of gastric tube and endotracheal tube. Interval placement of tracheostomy which terminates over the midline trachea. No pneumothorax. No pleural effusion. Low lung volumes persist with no new confluent airspace disease. Scoliotic curvature of the spine. IMPRESSION: Interval placement of tracheostomy, with removal of endotracheal tube and gastric tube. Low lung volumes persist. Electronically Signed   By: Corrie Mckusick D.O.   On: 06/24/2019 15:41    Anti-infectives: Anti-infectives (From admission, onward)   Start     Dose/Rate Route Frequency Ordered Stop   06/14/19 0600  vancomycin (VANCOCIN) 1,500 mg in sodium chloride 0.9 % 500 mL IVPB  Status:  Discontinued     1,500 mg 250 mL/hr over 120 Minutes Intravenous Every 24 hours 06/13/19 1207 06/13/19 1220   06/13/19 1800  vancomycin (VANCOCIN) 1,500 mg in sodium chloride 0.9 % 500 mL IVPB  Status:  Discontinued     1,500 mg 250 mL/hr over 120 Minutes Intravenous Every 24 hours 06/13/19 1220 06/14/19 0944   06/10/19 0615  vancomycin (VANCOCIN) IVPB 1000 mg/200 mL premix  Status:  Discontinued     1,000 mg 200 mL/hr over 60 Minutes Intravenous Every 12 hours 06/10/19 0605 06/13/19 1207   06/10/19 0615  ceFEPIme (MAXIPIME) 2 g in sodium chloride 0.9 % 100 mL IVPB  Status:  Discontinued     2 g 200 mL/hr over 30 Minutes  Intravenous Every 8 hours 06/10/19 0605 06/14/19 0944      Assessment/Plan: s/p Procedure(s): ESOPHAGOGASTRODUODENOSCOPY (EGD) (N/A) PERCUTANEOUS ENDOSCOPIC GASTROSTOMY (PEG) PLACEMENT (N/A) -agree with ongoing transfusion -d/w CCM doc -will con't to monitor  LOS: 20 days    Ralene Ok 06/26/2019

## 2019-06-27 ENCOUNTER — Inpatient Hospital Stay: Admission: RE | Admit: 2019-06-27 | Payer: Medicare Other | Source: Ambulatory Visit

## 2019-06-27 DIAGNOSIS — R0902 Hypoxemia: Secondary | ICD-10-CM | POA: Diagnosis not present

## 2019-06-27 LAB — CBC
HCT: 25.8 % — ABNORMAL LOW (ref 39.0–52.0)
Hemoglobin: 8.5 g/dL — ABNORMAL LOW (ref 13.0–17.0)
MCH: 27.8 pg (ref 26.0–34.0)
MCHC: 32.9 g/dL (ref 30.0–36.0)
MCV: 84.3 fL (ref 80.0–100.0)
Platelets: 233 10*3/uL (ref 150–400)
RBC: 3.06 MIL/uL — ABNORMAL LOW (ref 4.22–5.81)
RDW: 14.8 % (ref 11.5–15.5)
WBC: 13.8 10*3/uL — ABNORMAL HIGH (ref 4.0–10.5)
nRBC: 0 % (ref 0.0–0.2)

## 2019-06-27 LAB — GLUCOSE, CAPILLARY
Glucose-Capillary: 110 mg/dL — ABNORMAL HIGH (ref 70–99)
Glucose-Capillary: 116 mg/dL — ABNORMAL HIGH (ref 70–99)
Glucose-Capillary: 117 mg/dL — ABNORMAL HIGH (ref 70–99)
Glucose-Capillary: 118 mg/dL — ABNORMAL HIGH (ref 70–99)
Glucose-Capillary: 127 mg/dL — ABNORMAL HIGH (ref 70–99)
Glucose-Capillary: 134 mg/dL — ABNORMAL HIGH (ref 70–99)

## 2019-06-27 LAB — BPAM PLATELET PHERESIS
Blood Product Expiration Date: 202011232359
ISSUE DATE / TIME: 202011221132
Unit Type and Rh: 6200

## 2019-06-27 LAB — BPAM RBC
Blood Product Expiration Date: 202012222359
Blood Product Expiration Date: 202012222359
ISSUE DATE / TIME: 202011220812
ISSUE DATE / TIME: 202011221435
Unit Type and Rh: 5100
Unit Type and Rh: 5100

## 2019-06-27 LAB — PREPARE PLATELET PHERESIS: Unit division: 0

## 2019-06-27 LAB — TYPE AND SCREEN
ABO/RH(D): O POS
Antibody Screen: NEGATIVE
Unit division: 0
Unit division: 0

## 2019-06-27 NOTE — Plan of Care (Signed)
TRH pickup on 11/24 from PCCM.  See TRH communication is for further details.

## 2019-06-27 NOTE — TOC Progression Note (Signed)
Transition of Care [facility]) - Progression Note    Patient Details  Name: Derek Harrell MRN: [id number] Date of Birth: 11-07-1945  [facility]) CM/SW Contact  Bartholomew Crews, RN Phone Number: [phone] 06/27/2019, 10:45 AM  Clinical Narrative:    Noted notes stating stable for LTAC - not LTAC eligible d/t poor prognosis at baseline per [facility]. Spoke with spouse on Friday 11/20 about applying for Medicaid for long term care vs private pay for vent SNF.    Expected Discharge Plan: Long Term Nursing Home Barriers to Discharge: Continued Medical Work up  Expected Discharge Plan and Services Expected Discharge Plan: [place] In-house Referral: Clinical Social Work Discharge Planning Services: CM Consult   Living arrangements for the past 2 months: [place] Determinants of Health (SDOH) Interventions    Readmission Risk Interventions Readmission Risk Prevention Plan 06/03/2019  Transportation Screening Complete  PCP or Specialist Appt within 3-5 Days Complete  HRI or [place] Complete  Social Work Consult for [place] Planning/Counseling Complete  Palliative Care Screening Not Applicable  Medication Review Press photographer) Complete  Some recent data might be hidden

## 2019-06-27 NOTE — Progress Notes (Addendum)
NAME:  Derek Harrell, MRN:  [id number], DOB:  Apr 09, 1946, LOS: 21 ADMISSION DATE:  06/06/2019, CONSULTATION DATE:  11/2 REFERRING MD:  Johnney Killian, CHIEF COMPLAINT:  Hypertensive crisis and fever    Brief History   73 year old male w/ h/o parkinson's dementia.  Admitted 11/2, 3 days s/p endovascular infrarenal aortic aneurysm repair on 10/29 w/ worsening weakness, MS change, HTN and fever.   Admitted to ICU, developed third degree heart block with agonal respirations with thick mucous suctioned requiring intubation.  Extubated on 11/4.  Ttransferred out to the floor 11/5 afternoon.  Then 11/6 early AM he suffered cardiac arrest.  RN alerted by telemetry to a run of VT.  He then became bradycardica and arrested. ACLS done for 12 mins prior to ROSC. He was unresponsive in the post-arrest setting requiring intubation. Transferred to ICU for TTM.  Post rewarming, patient has had no improvement in mental status with MRI consistent with hypoxic encephalopathy.   S/p tracheostomy/PEG tube placement 06/24/2019  Past Medical History  AAA, prior CVA, HTN, HL, MG, RLS, PD, OSA (did not tolerate CPAP), gait disorder   Significant Hospital Events   11/2 >>  admitted to ICU 11/3  >>Bradycardic arrest from mucus plugging.  11/4 >> extubated 11/5 >> transferred to tele/ [facility] 11/6 >> PEA Cardiac Arrest, ROSC after 12 minutes >> began TTM to 36 11/7 >> Re-warming 11/7 completed at noon   Consults:  Vascular surgery  PMT  General surgery  Procedures:  ETT 11/3 >> 11/4, 11/6 >> CVC LIJ 11/06 >> 11/11 Right Radial Aline 11/06 >> 11/9 PEG tube placement 11/20 Tracheostomy 11/20  Significant Diagnostic Tests:  CT head 11/2 >> Stable white matter disease.  MRI brain 11/9 >> Restricted diffusion in the occipital lobes bilaterally and in the frontal parietal lobes over the convexity bilaterally compatible with acute or subacute infarction. Atrophy and extensive chronic ischemic change in the brain. Multiple areas  of chronic microhemorrhage which have progressed since 2017.  Micro Data:  Blood cultures 11/2>> negative   SARS Cov2 11/2 >> negative. Blood cultures 11/6 >> negative  Urine culture 11/6 >> negative Tracheal aspirate 11/6 >> negative  Antimicrobials:  Cefepime 11/6 >> 11/10 Vancomycin 11/6 >> 11/10  Interim history/subjective:  11/23: Pt with no acute event overnight.  11/22:Hemodynamically stable, afebrile, continues to ooze around tracheostomy site   Objective   Blood pressure 100/62, pulse 76, temperature 99 F (37.2 C), temperature source Axillary, resp. rate 18, height 6' (1.829 m), weight 85.2 kg, SpO2 99 %.    Vent Mode: PRVC FiO2 (%):  [30 %] 30 % Set Rate:  [16 bmp] 16 bmp Vt Set:  [620 mL] 620 mL PEEP:  [5 cmH20] 5 cmH20 Plateau Pressure:  [14 cmH20-15 cmH20] 15 cmH20   Intake/Output Summary (Last 24 hours) at 06/27/2019 0810 Last data filed at 06/27/2019 0800 Gross per 24 hour  Intake 2524.92 ml  Output 2325 ml  Net 199.92 ml   Filed Weights   06/25/19 0433 06/26/19 0500 06/27/19 0500  Weight: 84 kg 85 kg 85.2 kg   Examination:  General: Elderly, does not appear to be in distress, unresponsive on vent HEENT: NCAT ,Pupils reactive, trach in place with large clot adherent  Neuro: No response to noxious stimuli CV: S1-S2 appreciated, no murmur PULM: Clear breath sounds bilaterally GI: Bowel sounds appreciated Extremities: Skin is warm and dry Skin: No skin rashes  Chest x-ray reviewed by myself 11/20: Left basal atelectasis  Assessment & Plan:  Acute hypoxemic respiratory failure Concern for aspiration event with ongoing issues with swallowing and secretion management Obstructive sleep apnea, noncompliant with treatment at baseline Mental status precludes liberation from mechanical ventilator S/p PEG and trach 06/24/2019 Active oozing from trach and PEG site -VAP bundle -Intermittent chest x-ray -Transition to nursing facility when  stable -Continue full ventilator support -Monitor oozing from trach site and PEG site.. improved  Cardiac arrest, unclear etiology Intermittent bradycardia S/p bradycardic arrest lasting about 12 minutes -Telemetry monitoring -Trend electrolytes  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 coupled with myasthenia gravis at baseline MRI is consistent with anoxic injury -Continue neuro monitoring -Appreciate palliative care involvement -Likelihood of meaningful recovery is poor  Hypertension -Remained hemodynamically stable -Continue Norvasc  Nutrition -Tube feeds at goal  Myasthenia gravis -Continue Mestinon  Oozing from trach and PEG site related to being on antiplatelets -Transfuse platelets today  Anemia from blood loss -Transfuse 2 units packed red blood cells    Best practice:  Diet: Tube feeds at goal Pain/Anxiety/Delirium protocol (if indicated): Above antipsychotics-Parkinson's VAP protocol (if indicated): In place DVT prophylaxis: Subcu heparin-held since 19th, scd GI prophylaxis: Continue PPI Glucose control: SSI Mobility: BR Code Status: full code  Family Communication: Spoke with spouse  06/26/2019 Disposition: ICU, stable for transfer to [facility]... will ask TRH to assume care and we will follow for trach if placement not imminent  Critical care time: The patient is critically ill with multiple organ systems failure and requires high complexity decision making for assessment and support, frequent evaluation and titration of therapies, application of advanced monitoring technologies and extensive interpretation of multiple databases.  Critical care time 38 mins. This represents my time independent of the NPs time taking care of the pt. This is excluding procedures.    Audria Nine DO After hours pager: [phone]  [facility] 06/27/2019, 8:10 AM

## 2019-06-27 NOTE — TOC Progression Note (Addendum)
Transition of Care [facility]) - Progression Note    Patient Details  Name: Derek Harrell MRN: [id number] Date of Birth: [birth year]  Transition of [facility]) CM/SW Contact  Bartholomew Crews, RN Phone Number: [phone] 06/27/2019, 5:39 PM  Clinical Narrative:    Spoke with spouse at the bedside. She stated that she called DSS today, and they collected some information and are sending her a packet. Spouse advised that patient will need to be placed within 45 days for long term Medicaid to be processed stating that per DSS patient must be in a facility to complete process.   Discussed care at home, but providing 24/7 caregiving would be too overwhelming.   Discussed long term care in a vent SNF that may be out of state, and the lack of visitation.   Discussed [facility] which would allow patient to be at home on the ventilator for up to 4 weeks before withdrawing care.   Discussed conversations about what he would want, but their conversations were about cremation vs burial.   Spouse is overwhelmed with wanting to do the right thing by patient. She expressed appreciation for time spent with her.   Asked if other family members had videoed in to see and talk to patient. She said she sent a picture after his trach, so they could see his face. Spouse stated that she would like other family members to be able to visit, but had been advised that only one visitor could be assigned.   NCM advised that palliative care would continue to follow and would be available if she wanted to talk to them more. Spouse verbalized understanding.   TOC following for transition needs.    Expected Discharge Plan: Long Term Nursing Home Barriers to Discharge: Continued Medical Work up  Expected Discharge Plan and Services Expected Discharge Plan: [place] In-house Referral: Clinical Social Work Discharge Planning Services: CM Consult   Living arrangements for the past 2 months: [place] Determinants of Health (SDOH) Interventions    Readmission Risk Interventions Readmission Risk Prevention Plan 06/03/2019  Transportation Screening Complete  PCP or Specialist Appt within 3-5 Days Complete  HRI or [place] Complete  Social Work Consult for [place] Planning/Counseling Complete  Palliative Care Screening Not Applicable  Medication Review [employer] photographer) Complete  Some recent data might be hidden

## 2019-06-27 NOTE — Progress Notes (Signed)
SLP Cancellation Note  Patient Details Name: Derek Harrell MRN: [id number] DOB: 09-30-1945   Cancelled treatment:       Reason Eval/Treat Not Completed: Patient not medically ready. Will continue to follow along for potential for PMV.   Aspyn Warnke L. Tivis Ringer, [place] Office number [phone] Pager [phone]    Juan Quam Laurice 06/27/2019, 8:55 AM

## 2019-06-28 DIAGNOSIS — I469 Cardiac arrest, cause unspecified: Secondary | ICD-10-CM | POA: Diagnosis not present

## 2019-06-28 DIAGNOSIS — J9601 Acute respiratory failure with hypoxia: Secondary | ICD-10-CM | POA: Diagnosis not present

## 2019-06-28 DIAGNOSIS — Z9911 Dependence on respirator [ventilator] status: Secondary | ICD-10-CM | POA: Diagnosis not present

## 2019-06-28 DIAGNOSIS — G9340 Encephalopathy, unspecified: Secondary | ICD-10-CM | POA: Diagnosis not present

## 2019-06-28 LAB — GLUCOSE, CAPILLARY
Glucose-Capillary: 105 mg/dL — ABNORMAL HIGH (ref 70–99)
Glucose-Capillary: 115 mg/dL — ABNORMAL HIGH (ref 70–99)
Glucose-Capillary: 122 mg/dL — ABNORMAL HIGH (ref 70–99)
Glucose-Capillary: 124 mg/dL — ABNORMAL HIGH (ref 70–99)
Glucose-Capillary: 91 mg/dL (ref 70–99)
Glucose-Capillary: 97 mg/dL (ref 70–99)

## 2019-06-28 LAB — CBC
HCT: 25.8 % — ABNORMAL LOW (ref 39.0–52.0)
Hemoglobin: 8.1 g/dL — ABNORMAL LOW (ref 13.0–17.0)
MCH: 27.4 pg (ref 26.0–34.0)
MCHC: 31.4 g/dL (ref 30.0–36.0)
MCV: 87.2 fL (ref 80.0–100.0)
Platelets: 209 10*3/uL (ref 150–400)
RBC: 2.96 MIL/uL — ABNORMAL LOW (ref 4.22–5.81)
RDW: 15.1 % (ref 11.5–15.5)
WBC: 10 10*3/uL (ref 4.0–10.5)
nRBC: 0 % (ref 0.0–0.2)

## 2019-06-28 LAB — COMPREHENSIVE METABOLIC PANEL
ALT: 9 U/L (ref 0–44)
AST: 29 U/L (ref 15–41)
Albumin: 2.2 g/dL — ABNORMAL LOW (ref 3.5–5.0)
Alkaline Phosphatase: 78 U/L (ref 38–126)
Anion gap: 7 (ref 5–15)
BUN: 50 mg/dL — ABNORMAL HIGH (ref 8–23)
CO2: 27 mmol/L (ref 22–32)
Calcium: 8.4 mg/dL — ABNORMAL LOW (ref 8.9–10.3)
Chloride: 112 mmol/L — ABNORMAL HIGH (ref 98–111)
Creatinine, Ser: 0.9 mg/dL (ref 0.61–1.24)
GFR calc Af Amer: >60 mL/min (ref >60)
GFR calc non Af Amer: >60 mL/min (ref >60)
Glucose, Bld: 123 mg/dL — ABNORMAL HIGH (ref 70–99)
Potassium: 3.8 mmol/L (ref 3.5–5.1)
Sodium: 146 mmol/L — ABNORMAL HIGH (ref 135–145)
Total Bilirubin: 1 mg/dL (ref 0.3–1.2)
Total Protein: 5.6 g/dL — ABNORMAL LOW (ref 6.5–8.1)

## 2019-06-28 LAB — MAGNESIUM: Magnesium: 2.2 mg/dL (ref 1.7–2.4)

## 2019-06-28 NOTE — Progress Notes (Signed)
Patient ID: Derek Harrell, male   DOB: 1945-10-26, 73 y.o.   MRN: [id number]  PROGRESS NOTE    Derek Harrell  [id number] DOB: October 14, 1945 DOA: 06/06/2019 PCP: [name], MD   Brief Narrative:  73 year old male with history of Parkinson's dementia, hypertension, hyperlipidemia, OSA (did not tolerate CPAP), AAA, history of recent endovascular repair of aortic aneurysm 3 days prior to presentation on 06/02/2019, prior unspecified CVA, cellulitis, restless leg syndrome presented on 06/06/2019 with worsening weakness, mental status change, hypertension and fever.  He was admitted to ICU, developed third-degree heart block with agonal respirations with thick mucus suctioned requiring intubation.  He was extubated on 06/08/2019.  Transferred out to the floor on 06/09/2019.  On 11 6:20 AM, he suffered cardiac arrest requiring ACLS and intubation.  He was transferred back to ICU.  Post rewarming, patient has had no improvement in mental status with MRI consistent with hypoxic encephalopathy.  He is status post tracheostomy/PEG tube placement on 06/24/2019.  He is still on a ventilator via tracheostomy.  He was transferred to [facility] service on 06/28/2019.  PCCM will keep following for tracheostomy and vent.  Palliative care following with patient remains full code.  Assessment & Plan:   Acute hypoxic respiratory failure status post intubation and subsequent tracheostomy on a ventilator Concern for aspiration event with ongoing issues with swallowing and secretion management Obstructive sleep apnea, noncompliant with treatment at baseline Status post PEG tube placement -Patient has had a long hospitalization and could not be liberated from the ventilator because of mental status changes.  Subsequently tracheostomy and PEG tube were placed on 06/24/2019.  Transferred to [facility] service on 06/28/2019.  PCCM will keep following for vent/tracheostomy management. -Continue tube feeding as per dietitian recommendations.   Cardiac arrest, unclear etiology Intermittent bradycardia -Status post bradycardic arrest lasting for about 12 minutes on 06/10/2019.  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 -Palliative care has had multiple discussions with the family.  Patient remains full code.  Overall prognosis is poor.  Consider hospice/comfort measures.  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  Continue Norvasc  Acute on chronic anemia -Transfused 2 units of packed red cells on 06/26/2019.  Monitor hemoglobin.  Transfuse if hemoglobin less than 7.  Hemoglobin 8.1 today.    DVT prophylaxis: SCDs.  Subcutaneous heparin held since 06/23/2019 Code Status: Full Family Communication: None at bedside Disposition Plan: Depends on clinical outcome Consultants: PCCM/vascular surgery/palliative care/general surgery  Procedures:  Endotracheal tube: 06/07/2019-06/08/2019; 06/10/2019 onwards Tracheostomy and PEG tube placement on 06/24/2019 CVC left IJ: 06/10/2019-06/15/2019  Antimicrobials:  Anti-infectives (From admission, onward)   Start     Dose/Rate Route Frequency Ordered Stop   06/14/19 0600  vancomycin (VANCOCIN) 1,500 mg in sodium chloride 0.9 % 500 mL IVPB  Status:  Discontinued     1,500 mg 250 mL/hr over 120 Minutes Intravenous Every 24 hours 06/13/19 1207 06/13/19 1220   06/13/19 1800  vancomycin (VANCOCIN) 1,500 mg in sodium chloride 0.9 % 500 mL IVPB  Status:  Discontinued     1,500 mg 250 mL/hr over 120 Minutes Intravenous Every 24 hours 06/13/19 1220 06/14/19 0944   06/10/19 0615  vancomycin (VANCOCIN) IVPB 1000 mg/200 mL premix  Status:  Discontinued     1,000 mg 200 mL/hr over 60 Minutes Intravenous Every 12 hours 06/10/19 0605 06/13/19 1207  06/10/19 0615  ceFEPIme  (MAXIPIME) 2 g in sodium chloride 0.9 % 100 mL IVPB  Status:  Discontinued     2 g 200 mL/hr over 30 Minutes Intravenous Every 8 hours 06/10/19 0605 06/14/19 0944       Subjective: Patient seen and examined at bedside.  He is unresponsive on a ventilator via tracheostomy.  No overnight fever noted.  Objective: Vitals:   06/28/19 0800 06/28/19 0812 06/28/19 0900 06/28/19 1000  BP: 132/77  127/75 130/74  Pulse: 71  69 69  Resp: '16  15 15  '$ Temp: 98.6 F (37 C)     TempSrc: Oral     SpO2: 99% 99% 99% 100%  Weight:      Height:        Intake/Output Summary (Last 24 hours) at 06/28/2019 1106 Last data filed at 06/28/2019 1000 Gross per 24 hour  Intake 2460 ml  Output 1900 ml  Net 560 ml   Filed Weights   06/26/19 0500 06/27/19 0500 06/28/19 0405  Weight: 85 kg 85.2 kg 81.7 kg    Examination:  General exam: Looks chronically ill.  Unresponsive on a ventilator via tracheostomy Neck: Tracheostomy present Respiratory system: Bilateral decreased breath sounds at bases with scattered crackles Cardiovascular system: S1 & S2 heard, Rate controlled Gastrointestinal system: Abdomen is nondistended, soft and PEG tube present. Normal bowel sounds heard. Extremities: No cyanosis, clubbing; trace lower extremity edema Central nervous system: Unresponsive  skin: No rashes, lesions or ulcers Psychiatry: Could not be assessed because of patient being unresponsive    Data Reviewed: I have personally reviewed following labs and imaging studies  CBC: Recent Labs  Lab 06/24/19 2210 06/25/19 0423 06/25/19 0614 06/26/19 0430 06/26/19 1945 06/28/19 0200  WBC 14.5*  --  12.7* 12.4* 13.8* 10.0  NEUTROABS 12.7*  --   --   --   --   --   HGB 8.8* 5.8* 8.7* 7.0* 8.5* 8.1*  HCT 27.4* 17.3* 27.0* 21.6* 25.8* 25.8*  MCV 84.3  --  84.1 83.4 84.3 87.2  PLT 253  --  287 232 233 494   Basic Metabolic Panel: Recent Labs  Lab 06/22/19 0311 06/25/19 0423 06/28/19 0200  NA 139 140 146*   K 4.0 4.3 3.8  CL 104 106 112*  CO2 '26 25 27  '$ GLUCOSE 109* 128* 123*  BUN 46* 45* 50*  CREATININE 0.86 0.95 0.90  CALCIUM 8.5* 8.6* 8.4*  MG 2.1 2.1 2.2  PHOS 3.9 4.4  --    GFR: Estimated Creatinine Clearance: 82.6 mL/min (by C-G formula based on SCr of 0.9 mg/dL). Liver Function Tests: Recent Labs  Lab 06/28/19 0200  AST 29  ALT 9  ALKPHOS 78  BILITOT 1.0  PROT 5.6*  ALBUMIN 2.2*   No results for input(s): LIPASE, AMYLASE in the last 168 hours. No results for input(s): AMMONIA in the last 168 hours. Coagulation Profile: Recent Labs  Lab 06/24/19 2210  INR 1.2   Cardiac Enzymes: No results for input(s): CKTOTAL, CKMB, CKMBINDEX, TROPONINI in the last 168 hours. BNP (last 3 results) No results for input(s): PROBNP in the last 8760 hours. HbA1C: No results for input(s): HGBA1C in the last 72 hours. CBG: Recent Labs  Lab 06/27/19 1520 06/27/19 2230 06/28/19 0051 06/28/19 0405 06/28/19 0754  GLUCAP 116* 110* 122* 97 124*   Lipid Profile: No results for input(s): CHOL, HDL, LDLCALC, TRIG, CHOLHDL, LDLDIRECT in the last 72 hours. Thyroid Function Tests: No results for input(s): TSH, T4TOTAL, FREET4,  T3FREE, THYROIDAB in the last 72 hours. Anemia Panel: No results for input(s): VITAMINB12, FOLATE, FERRITIN, TIBC, IRON, RETICCTPCT in the last 72 hours. Sepsis Labs: No results for input(s): PROCALCITON, LATICACIDVEN in the last 168 hours.  No results found for this or any previous visit (from the past 240 hour(s)).       Radiology Studies: No results found.      Scheduled Meds: . amLODipine  10 mg Per Tube Daily  . Carbidopa-Levodopa ER  1 tablet Per Tube 5 X Daily  . chlorhexidine gluconate (MEDLINE KIT)  15 mL Mouth Rinse BID  . Chlorhexidine Gluconate Cloth  6 each Topical Daily  . docusate  50 mg Per Tube Daily  . feeding supplement (PRO-STAT SUGAR FREE 64)  30 mL Per Tube TID  . free water  250 mL Per Tube Q8H  . insulin aspart  0-15 Units  Subcutaneous TID WC  . mouth rinse  15 mL Mouth Rinse 10 times per [place]  . pantoprazole sodium  40 mg Per Tube Daily  . Pimavanserin Tartrate  34 mg Per Tube Daily  . pyridostigmine  60 mg Per Tube BID  . rosuvastatin  10 mg Per Tube q1800   Continuous Infusions: . sodium chloride Stopped (06/10/19 0000)  . sodium chloride Stopped (06/21/19 1744)  . feeding supplement (VITAL AF 1.2 CAL) 55 mL/hr at 06/27/19 1800          Akashdeep Chuba Starla Link, MD [facility] 06/28/2019, 11:06 AM

## 2019-06-29 DIAGNOSIS — Z9911 Dependence on respirator [ventilator] status: Secondary | ICD-10-CM | POA: Diagnosis not present

## 2019-06-29 DIAGNOSIS — I469 Cardiac arrest, cause unspecified: Secondary | ICD-10-CM | POA: Diagnosis not present

## 2019-06-29 DIAGNOSIS — G9340 Encephalopathy, unspecified: Secondary | ICD-10-CM | POA: Diagnosis not present

## 2019-06-29 DIAGNOSIS — J9601 Acute respiratory failure with hypoxia: Secondary | ICD-10-CM | POA: Diagnosis not present

## 2019-06-29 LAB — COMPREHENSIVE METABOLIC PANEL
ALT: 9 U/L (ref 0–44)
AST: 33 U/L (ref 15–41)
Albumin: 2.2 g/dL — ABNORMAL LOW (ref 3.5–5.0)
Alkaline Phosphatase: 85 U/L (ref 38–126)
Anion gap: 8 (ref 5–15)
BUN: 51 mg/dL — ABNORMAL HIGH (ref 8–23)
CO2: 27 mmol/L (ref 22–32)
Calcium: 8.6 mg/dL — ABNORMAL LOW (ref 8.9–10.3)
Chloride: 111 mmol/L (ref 98–111)
Creatinine, Ser: 0.88 mg/dL (ref 0.61–1.24)
GFR calc Af Amer: >60 mL/min (ref >60)
GFR calc non Af Amer: >60 mL/min (ref >60)
Glucose, Bld: 127 mg/dL — ABNORMAL HIGH (ref 70–99)
Potassium: 3.8 mmol/L (ref 3.5–5.1)
Sodium: 146 mmol/L — ABNORMAL HIGH (ref 135–145)
Total Bilirubin: 1.1 mg/dL (ref 0.3–1.2)
Total Protein: 5.9 g/dL — ABNORMAL LOW (ref 6.5–8.1)

## 2019-06-29 LAB — GLUCOSE, CAPILLARY
Glucose-Capillary: 104 mg/dL — ABNORMAL HIGH (ref 70–99)
Glucose-Capillary: 107 mg/dL — ABNORMAL HIGH (ref 70–99)
Glucose-Capillary: 107 mg/dL — ABNORMAL HIGH (ref 70–99)
Glucose-Capillary: 119 mg/dL — ABNORMAL HIGH (ref 70–99)
Glucose-Capillary: 123 mg/dL — ABNORMAL HIGH (ref 70–99)
Glucose-Capillary: 124 mg/dL — ABNORMAL HIGH (ref 70–99)
Glucose-Capillary: 166 mg/dL — ABNORMAL HIGH (ref 70–99)

## 2019-06-29 LAB — CBC
HCT: 25.3 % — ABNORMAL LOW (ref 39.0–52.0)
Hemoglobin: 8.2 g/dL — ABNORMAL LOW (ref 13.0–17.0)
MCH: 27.8 pg (ref 26.0–34.0)
MCHC: 32.4 g/dL (ref 30.0–36.0)
MCV: 85.8 fL (ref 80.0–100.0)
Platelets: 192 10*3/uL (ref 150–400)
RBC: 2.95 MIL/uL — ABNORMAL LOW (ref 4.22–5.81)
RDW: 15 % (ref 11.5–15.5)
WBC: 8.2 10*3/uL (ref 4.0–10.5)
nRBC: 0 % (ref 0.0–0.2)

## 2019-06-29 LAB — MAGNESIUM: Magnesium: 2.3 mg/dL (ref 1.7–2.4)

## 2019-06-29 MED ORDER — OSMOLITE 1.5 CAL PO LIQD
474.0000 mL | Freq: Three times a day (TID) | ORAL | Status: DC
  Administered 2019-06-29 – 2019-07-03 (×13): 474 mL
  Administered 2019-07-04: 237 mL
  Administered 2019-07-04 – 2019-07-25 (×63): 474 mL
  Filled 2019-06-29 (×5): qty 474
  Filled 2019-06-29 (×2): qty 1000
  Filled 2019-06-29 (×15): qty 474
  Filled 2019-06-29: qty 1000
  Filled 2019-06-29 (×9): qty 474
  Filled 2019-06-29: qty 1000
  Filled 2019-06-29 (×6): qty 474
  Filled 2019-06-29: qty 1000
  Filled 2019-06-29: qty 474
  Filled 2019-06-29 (×2): qty 1000
  Filled 2019-06-29 (×4): qty 474
  Filled 2019-06-29: qty 1000
  Filled 2019-06-29 (×16): qty 474
  Filled 2019-06-29 (×2): qty 1000
  Filled 2019-06-29 (×2): qty 474
  Filled 2019-06-29: qty 1000
  Filled 2019-06-29 (×2): qty 474
  Filled 2019-06-29: qty 1000
  Filled 2019-06-29: qty 474
  Filled 2019-06-29: qty 1000
  Filled 2019-06-29 (×7): qty 474
  Filled 2019-06-29: qty 1000
  Filled 2019-06-29 (×3): qty 474

## 2019-06-29 NOTE — Progress Notes (Signed)
Spoke w/ Lorriane Shire, LCSW to relay that pts wife requested to speak w/ her or Wendi, [facility] regarding any updates on placement for pt.  Lorriane Shire states will contact pts wife.

## 2019-06-29 NOTE — Progress Notes (Signed)
Spoke w/ pts wife to provide updates. Pts wife appreciative, wishing to speak w/ CM. Will relay to SW/CM.

## 2019-06-29 NOTE — Progress Notes (Signed)
Patient transported form room 3M08 to 3M03 on ventilator with no problems.  Ashley Mariner RRT

## 2019-06-29 NOTE — Clinical Social Work Note (Signed)
CSW informed that wife had requested a call from SW. Call made to Derek Harrell [phone]) and talked with her regarding SNF placement for her husband and if the [place] could assist and this was discussed. CSW advised that she has a call in to someone at the [facility] and is waiting for a call back. Derek Harrell informed that she should definitely discuss SNF placement through the [place] with a [facility] representative. CSW will continue to follow and assist as needed with SNF placement.  Derek Harrell, MSW, LCSW Licensed Clinical Social Worker [place] [phone]

## 2019-06-29 NOTE — Progress Notes (Signed)
 Nutrition Follow-up  DOCUMENTATION CODES:   Not applicable  INTERVENTION:   Tube Feeding:  Change to bolus feedings of Osmolite 1.5 474 mL (2 cans) TID Pro-Stat 30 mL TID Provides 2430 kcals, 134 g of protein and 1086 mL of free water  NUTRITION DIAGNOSIS:   Inadequate oral intake related to inability to eat as evidenced by NPO status.  Being addressed via TF  GOAL:   Patient will meet greater than or equal to 90% of their needs  Progressing  MONITOR:   Vent status, Labs, Weight trends, TF tolerance, Skin, I & O's, Other (Comment)(GOC)  REASON FOR ASSESSMENT:   Ventilator, Consult Enteral/tube feeding initiation and management  ASSESSMENT:   73 yo male admitted with worsening weakness, MS change, HTN, fever. PMH includes HTN, AAA, HLD, CVD, obesity, stroke, myasthenia gravis, RLS, Parkinson's disease, dementia.  11/02 - admit 11/03 - Cortrak placed, tip gastric per Cortrak team 11/04 - extubated 11/06 - cardiac arrest, reintubated, TTM 36 11/20 - Trach and PEG placed  Tolerating PS weans  Vital AF 1.2 at 55 ml/hr, Pro-Stat 30 mL TID, 250 mL of free water q 8 hours   Labs: sodium 146 (H), Creatinine wdl Meds: reviewed  Diet Order:   Diet Order            Diet NPO time specified  Diet effective now              EDUCATION NEEDS:   No education needs have been identified at this time  Skin:  Skin Assessment: Skin Integrity Issues: Skin Integrity Issues:: Stage II, Incisions Stage II: buttocks Incisions: groin  Last BM:  11/25  Height:   Ht Readings from Last 1 Encounters:  06/06/19 6' (1.829 m)    Weight:   Wt Readings from Last 1 Encounters:  06/29/19 81 kg    Ideal Body Weight:  80.9 kg  BMI:  Body mass index is 24.22 kg/m.  Estimated Nutritional Needs:   Kcal:  2200-2400 kcals  Protein:  110-130 g  Fluid:  >/= 2 L   Camora Tremain MS, RDN, LDN, CNSC [phone] Pager  [phone] Weekend/On-Call Pager

## 2019-06-29 NOTE — Progress Notes (Signed)
NAME:  Derek Harrell, MRN:  [id number], DOB:  1946/05/01, LOS: 23 ADMISSION DATE:  06/06/2019, CONSULTATION DATE:  11/2 REFERRING MD:  Johnney Killian, CHIEF COMPLAINT:  Hypertensive crisis and fever    Brief History   73 year old male w/ h/o parkinson's dementia.  Admitted 11/2, 3 days s/p endovascular infrarenal aortic aneurysm repair on 10/29 w/ worsening weakness, MS change, HTN and fever.   Admitted to ICU, developed third degree heart block with agonal respirations with thick mucous suctioned requiring intubation.  Extubated on 11/4.  Ttransferred out to the floor 11/5 afternoon.  Then 11/6 early AM he suffered cardiac arrest.  RN alerted by telemetry to a run of VT.  He then became bradycardica and arrested. ACLS done for 12 mins prior to ROSC. He was unresponsive in the post-arrest setting requiring intubation. Transferred to ICU for TTM.  Post rewarming, patient has had no improvement in mental status with MRI consistent with hypoxic encephalopathy.   S/p tracheostomy/PEG tube placement 06/24/2019  Past Medical History  AAA, prior CVA, HTN, HL, MG, RLS, PD, OSA (did not tolerate CPAP), gait disorder   Significant Hospital Events   11/2 >>  admitted to ICU 11/3  >>Bradycardic arrest from mucus plugging.  11/4 >> extubated 11/5 >> transferred to tele/ TRH 11/6 >> PEA Cardiac Arrest, ROSC after 12 minutes >> began TTM to 36 11/7 >> Re-warming 11/7 completed at noon  11/20 >> trach, PEG by surgery. Had bleeding post procedure 11/24 >> Transfer to hospitalist service  Consults:  Vascular surgery  PMT  General surgery  Procedures:  ETT 11/3 >> 11/4, 11/6 >> CVC LIJ 11/06 >> 11/11 Right Radial Aline 11/06 >> 11/9 PEG tube placement 11/20 Tracheostomy 11/20  Significant Diagnostic Tests:  CT head 11/2 >> Stable white matter disease.  MRI brain 11/9 >> Restricted diffusion in the occipital lobes bilaterally and in the frontal parietal lobes over the convexity bilaterally compatible with  acute or subacute infarction. Atrophy and extensive chronic ischemic change in the brain. Multiple areas of chronic microhemorrhage which have progressed since 2017.  Micro Data:  Blood cultures 11/2>> negative   SARS Cov2 11/2 >> negative. Blood cultures 11/6 >> negative  Urine culture 11/6 >> negative Tracheal aspirate 11/6 >> negative  Antimicrobials:  Cefepime 11/6 >> 11/10 Vancomycin 11/6 >> 11/10  Interim history/subjective:  On pressure support weans 10/5.  Mental status remains unchanged. No active bleeding from tracheostomy site.  Objective   Blood pressure 132/77, pulse 73, temperature 98.6 F (37 C), temperature source Oral, resp. rate 16, height 6' (1.829 m), weight 81 kg, SpO2 100 %.    Vent Mode: PRVC FiO2 (%):  [30 %] 30 % Set Rate:  [16 bmp] 16 bmp Vt Set:  [620 mL] 620 mL PEEP:  [5 cmH20] 5 cmH20 Pressure Support:  [10 cmH20] 10 cmH20 Plateau Pressure:  [14 cmH20-17 cmH20] 14 cmH20   Intake/Output Summary (Last 24 hours) at 06/29/2019 1004 Last data filed at 06/29/2019 0900 Gross per 24 hour  Intake 1220 ml  Output 2250 ml  Net -1030 ml   Filed Weights   06/27/19 0500 06/28/19 0405 06/29/19 0409  Weight: 85.2 kg 81.7 kg 81 kg   Examination:  Gen:      No acute distress HEENT:  EOMI, sclera anicteric Neck:     No masses; no thyromegaly trach in place Lungs:    Clear to auscultation bilaterally; normal respiratory effort CV:         Regular rate  and rhythm; no murmurs Abd:      + bowel sounds; soft, non-tender; no palpable masses, no distension Ext:    No edema; adequate peripheral perfusion Skin:      Warm and dry; no rash Neuro: Unresponsive, pupils reactive  Assessment & Plan:   Acute hypoxemic respiratory failure Obstructive sleep apnea, noncompliant with treatment at baseline Anoxic encephalopathy.   Mental status precludes liberation from mechanical ventilator Active oozing from trach and PEG site > improved Continue pressure support  weans as tolerated Intermittent chest x-ray Routine trach care  Best practice:  Diet: Tube feeds at goal Pain/Anxiety/Delirium protocol (if indicated): Above antipsychotics-Parkinson's VAP protocol (if indicated): In place DVT prophylaxis: Subcu heparin-held since 19th, scd GI prophylaxis: Continue PPI Glucose control: SSI Mobility: BR Code Status: full code  Family Communication: Per primary Disposition: ICU, awaiting placement  Marshell Garfinkel MD [facility] 06/29/2019, 10:07 AM

## 2019-06-29 NOTE — Progress Notes (Signed)
Patient ID: Derek Harrell, male   DOB: [birth year], 73 y.o.   MRN: [id number]  PROGRESS NOTE    Derek Harrell  [id number] DOB: 06-11-1946 DOA: 06/06/2019 PCP: [name], MD   Brief Narrative:  73 year old male with history of Parkinson's dementia, hypertension, hyperlipidemia, OSA (did not tolerate CPAP), AAA, history of recent endovascular repair of aortic aneurysm 3 days prior to presentation on 06/02/2019, prior unspecified CVA, cellulitis, restless leg syndrome presented on 06/06/2019 with worsening weakness, mental status change, hypertension and fever.  He was admitted to ICU, developed third-degree heart block with agonal respirations with thick mucus suctioned requiring intubation.  He was extubated on 06/08/2019.  Transferred out to the floor on 06/09/2019.  On 11 6:20 AM, he suffered cardiac arrest requiring ACLS and intubation.  He was transferred back to ICU.  Post rewarming, patient has had no improvement in mental status with MRI consistent with hypoxic encephalopathy.  He is status post tracheostomy/PEG tube placement on 06/24/2019.  He is still on a ventilator via tracheostomy.  He was transferred to [facility] service on 06/28/2019.  PCCM will keep following for tracheostomy and vent.  Palliative care following with patient remains full code.  Assessment & Plan:   Acute hypoxic respiratory failure status post intubation and subsequent tracheostomy on a ventilator Concern for aspiration event with ongoing issues with swallowing and secretion management Obstructive sleep apnea, noncompliant with treatment at baseline Status post PEG tube placement -Patient has had a long hospitalization and could not be liberated from the ventilator because of mental status changes.  Subsequently tracheostomy and PEG tube were placed on 06/24/2019.  Transferred to [facility] service on 06/28/2019.  PCCM will keep following for vent/tracheostomy management. -Continue tube feeding as per dietitian recommendations.   Cardiac arrest, unclear etiology Intermittent bradycardia -Status post bradycardic arrest lasting for about 12 minutes on 06/10/2019.  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 -Palliative care has had multiple discussions with the family.  Patient remains full code.  Overall prognosis is poor.  Consider hospice/comfort measures.  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  Continue Norvasc  Acute on chronic anemia -Transfused 2 units of packed red cells on 06/26/2019.  Monitor hemoglobin.  Transfuse if hemoglobin less than 7.  Hemoglobin 8.2 today.    DVT prophylaxis: SCDs.  Subcutaneous heparin held since 06/23/2019 because of oozing around tracheostomy and PEG tube site Code Status: Full Family Communication: None at bedside Disposition Plan: Depends on clinical outcome Consultants: PCCM/vascular surgery/palliative care/general surgery  Procedures:  Endotracheal tube: 06/07/2019-06/08/2019; 06/10/2019 onwards Tracheostomy and PEG tube placement on 06/24/2019 CVC left IJ: 06/10/2019-06/15/2019  Antimicrobials:  Anti-infectives (From admission, onward)   Start     Dose/Rate Route Frequency Ordered Stop   06/14/19 0600  vancomycin (VANCOCIN) 1,500 mg in sodium chloride 0.9 % 500 mL IVPB  Status:  Discontinued     1,500 mg 250 mL/hr over 120 Minutes Intravenous Every 24 hours 06/13/19 1207 06/13/19 1220   06/13/19 1800  vancomycin (VANCOCIN) 1,500 mg in sodium chloride 0.9 % 500 mL IVPB  Status:  Discontinued     1,500 mg 250 mL/hr over 120 Minutes Intravenous Every 24 hours 06/13/19 1220 06/14/19 0944   06/10/19 0615  vancomycin (VANCOCIN) IVPB 1000 mg/200 mL premix  Status:  Discontinued     1,000 mg 200 mL/hr over 60  Minutes Intravenous Every 12 hours  06/10/19 0605 06/13/19 1207   06/10/19 0615  ceFEPIme (MAXIPIME) 2 g in sodium chloride 0.9 % 100 mL IVPB  Status:  Discontinued     2 g 200 mL/hr over 30 Minutes Intravenous Every 8 hours 06/10/19 0605 06/14/19 0944       Subjective: Patient seen and examined at bedside.  He is unresponsive on a ventilator via tracheostomy.  No overnight vomiting or fever noted.  Objective: Vitals:   06/29/19 0400 06/29/19 0409 06/29/19 0500 06/29/19 0600  BP: 131/77  131/71 132/77  Pulse: 76  67 73  Resp: '16  16 16  '$ Temp:  99.4 F (37.4 C)    TempSrc:  Oral    SpO2: 100%  100% 100%  Weight:  81 kg    Height:        Intake/Output Summary (Last 24 hours) at 06/29/2019 0728 Last data filed at 06/29/2019 0600 Gross per 24 hour  Intake 1265 ml  Output 2050 ml  Net -785 ml   Filed Weights   06/27/19 0500 06/28/19 0405 06/29/19 0409  Weight: 85.2 kg 81.7 kg 81 kg    Examination:  General exam: Looks chronically ill.  Unresponsive on a ventilator via tracheostomy.  No acute distress. Neck: Trach present. Respiratory system: Bilateral decreased breath sounds at bases with no wheezing.  Some scattered crackles  cardiovascular system: Rate controlled, S1-S2 heard Gastrointestinal system: Abdomen is nondistended, soft and PEG tube present. Normal bowel sounds heard. Extremities: No cyanosis; trace lower extremity edema Central nervous system: Unresponsive  skin: No rashes, lesions or ulcers Psychiatry: Could not be assessed because of patient being unresponsive    Data Reviewed: I have personally reviewed following labs and imaging studies  CBC: Recent Labs  Lab 06/24/19 2210  06/25/19 0614 06/26/19 0430 06/26/19 1945 06/28/19 0200 06/29/19 0251  WBC 14.5*  --  12.7* 12.4* 13.8* 10.0 8.2  NEUTROABS 12.7*  --   --   --   --   --   --   HGB 8.8*   < > 8.7* 7.0* 8.5* 8.1* 8.2*  HCT 27.4*   < > 27.0* 21.6* 25.8* 25.8* 25.3*  MCV 84.3  --  84.1 83.4 84.3 87.2 85.8  PLT 253  --  287  232 233 209 192   < > = values in this interval not displayed.   Basic Metabolic Panel: Recent Labs  Lab 06/25/19 0423 06/28/19 0200 06/29/19 0251  NA 140 146* 146*  K 4.3 3.8 3.8  CL 106 112* 111  CO2 '25 27 27  '$ GLUCOSE 128* 123* 127*  BUN 45* 50* 51*  CREATININE 0.95 0.90 0.88  CALCIUM 8.6* 8.4* 8.6*  MG 2.1 2.2 2.3  PHOS 4.4  --   --    GFR: Estimated Creatinine Clearance: 84.5 mL/min (by C-G formula based on SCr of 0.88 mg/dL). Liver Function Tests: Recent Labs  Lab 06/28/19 0200 06/29/19 0251  AST 29 33  ALT 9 9  ALKPHOS 78 85  BILITOT 1.0 1.1  PROT 5.6* 5.9*  ALBUMIN 2.2* 2.2*   No results for input(s): LIPASE, AMYLASE in the last 168 hours. No results for input(s): AMMONIA in the last 168 hours. Coagulation Profile: Recent Labs  Lab 06/24/19 2210  INR 1.2   Cardiac Enzymes: No results for input(s): CKTOTAL, CKMB, CKMBINDEX, TROPONINI in the last 168 hours. BNP (last 3 results) No results for input(s): PROBNP in the last 8760 hours. HbA1C: No results for input(s): HGBA1C  in the last 72 hours. CBG: Recent Labs  Lab 06/28/19 1529 06/28/19 1950 06/29/19 0030 06/29/19 0349 06/29/19 0723  GLUCAP 91 115* 123* 107* 124*   Lipid Profile: No results for input(s): CHOL, HDL, LDLCALC, TRIG, CHOLHDL, LDLDIRECT in the last 72 hours. Thyroid Function Tests: No results for input(s): TSH, T4TOTAL, FREET4, T3FREE, THYROIDAB in the last 72 hours. Anemia Panel: No results for input(s): VITAMINB12, FOLATE, FERRITIN, TIBC, IRON, RETICCTPCT in the last 72 hours. Sepsis Labs: No results for input(s): PROCALCITON, LATICACIDVEN in the last 168 hours.  No results found for this or any previous visit (from the past 240 hour(s)).       Radiology Studies: No results found.      Scheduled Meds: . amLODipine  10 mg Per Tube Daily  . Carbidopa-Levodopa ER  1 tablet Per Tube 5 X Daily  . chlorhexidine gluconate (MEDLINE KIT)  15 mL Mouth Rinse BID  .  Chlorhexidine Gluconate Cloth  6 each Topical Daily  . docusate  50 mg Per Tube Daily  . feeding supplement (PRO-STAT SUGAR FREE 64)  30 mL Per Tube TID  . free water  250 mL Per Tube Q8H  . insulin aspart  0-15 Units Subcutaneous TID WC  . mouth rinse  15 mL Mouth Rinse 10 times per [place]  . pantoprazole sodium  40 mg Per Tube Daily  . Pimavanserin Tartrate  34 mg Per Tube Daily  . pyridostigmine  60 mg Per Tube BID  . rosuvastatin  10 mg Per Tube q1800   Continuous Infusions: . sodium chloride Stopped (06/10/19 0000)  . sodium chloride Stopped (06/21/19 1744)  . feeding supplement (VITAL AF 1.2 CAL) 1,000 mL (06/28/19 1200)          Aline August, MD [facility] 06/29/2019, 7:28 AM

## 2019-06-29 NOTE — Progress Notes (Signed)
SLP Cancellation Note  Patient Details Name: Dayshaun Basaldua MRN: [id number] DOB: [birth year]   Cancelled treatment:       Reason Eval/Treat Not Completed: Patient not medically ready   Delshawn Stech, Selinda Orion 06/29/2019, 12:36 PM

## 2019-06-30 DIAGNOSIS — G20 Parkinson's disease: Secondary | ICD-10-CM | POA: Diagnosis not present

## 2019-06-30 DIAGNOSIS — I469 Cardiac arrest, cause unspecified: Secondary | ICD-10-CM | POA: Diagnosis not present

## 2019-06-30 DIAGNOSIS — G9340 Encephalopathy, unspecified: Secondary | ICD-10-CM | POA: Diagnosis not present

## 2019-06-30 DIAGNOSIS — J9601 Acute respiratory failure with hypoxia: Secondary | ICD-10-CM | POA: Diagnosis not present

## 2019-06-30 LAB — GLUCOSE, CAPILLARY
Glucose-Capillary: 116 mg/dL — ABNORMAL HIGH (ref 70–99)
Glucose-Capillary: 106 mg/dL — ABNORMAL HIGH (ref 70–99)
Glucose-Capillary: 167 mg/dL — ABNORMAL HIGH (ref 70–99)
Glucose-Capillary: 151 mg/dL — ABNORMAL HIGH (ref 70–99)
Glucose-Capillary: 148 mg/dL — ABNORMAL HIGH (ref 70–99)

## 2019-06-30 LAB — CBC
HCT: 25.2 % — ABNORMAL LOW (ref 39.0–52.0)
Hemoglobin: 7.9 g/dL — ABNORMAL LOW (ref 13.0–17.0)
MCH: 27.2 pg (ref 26.0–34.0)
MCHC: 31.3 g/dL (ref 30.0–36.0)
MCV: 86.9 fL (ref 80.0–100.0)
Platelets: 171 10*3/uL (ref 150–400)
RBC: 2.9 MIL/uL — ABNORMAL LOW (ref 4.22–5.81)
RDW: 14.9 % (ref 11.5–15.5)
WBC: 7.6 10*3/uL (ref 4.0–10.5)
nRBC: 0 % (ref 0.0–0.2)

## 2019-06-30 LAB — COMPREHENSIVE METABOLIC PANEL
ALT: 10 U/L (ref 0–44)
AST: 32 U/L (ref 15–41)
Albumin: 2.2 g/dL — ABNORMAL LOW (ref 3.5–5.0)
Alkaline Phosphatase: 86 U/L (ref 38–126)
Anion gap: 9 (ref 5–15)
BUN: 48 mg/dL — ABNORMAL HIGH (ref 8–23)
CO2: 27 mmol/L (ref 22–32)
Calcium: 8.6 mg/dL — ABNORMAL LOW (ref 8.9–10.3)
Chloride: 112 mmol/L — ABNORMAL HIGH (ref 98–111)
Creatinine, Ser: 0.92 mg/dL (ref 0.61–1.24)
GFR calc Af Amer: >60 mL/min (ref >60)
GFR calc non Af Amer: >60 mL/min (ref >60)
Glucose, Bld: 175 mg/dL — ABNORMAL HIGH (ref 70–99)
Potassium: 3.7 mmol/L (ref 3.5–5.1)
Sodium: 148 mmol/L — ABNORMAL HIGH (ref 135–145)
Total Bilirubin: 0.8 mg/dL (ref 0.3–1.2)
Total Protein: 5.7 g/dL — ABNORMAL LOW (ref 6.5–8.1)

## 2019-06-30 LAB — MAGNESIUM: Magnesium: 2.3 mg/dL (ref 1.7–2.4)

## 2019-06-30 MED ORDER — FREE WATER
300.0000 mL | Freq: Three times a day (TID) | Status: DC
  Administered 2019-06-30 – 2019-07-01 (×3): 300 mL

## 2019-06-30 NOTE — Progress Notes (Signed)
Velcro on PT's trach ties no longer adhering due to old crusted blood. Trach not fully secured. Lurline Idol ties carefully exchanged. No new bleeding. Will continue to monitor site for new blood or drainage.

## 2019-06-30 NOTE — Progress Notes (Signed)
Patient ID: Derek Harrell, male   DOB: 1946-02-16, 73 y.o.   MRN: [id number]  PROGRESS NOTE    Derek Harrell  [id number] DOB: Oct 15, 1945 DOA: 06/06/2019 PCP: [name], MD   Brief Narrative:  73 year old male with history of Parkinson's dementia, hypertension, hyperlipidemia, OSA (did not tolerate CPAP), AAA, history of recent endovascular repair of aortic aneurysm 3 days prior to presentation on 06/02/2019, prior unspecified CVA, cellulitis, restless leg syndrome presented on 06/06/2019 with worsening weakness, mental status change, hypertension and fever.  He was admitted to ICU, developed third-degree heart block with agonal respirations with thick mucus suctioned requiring intubation.  He was extubated on 06/08/2019.  Transferred out to the floor on 06/09/2019.  On 11 6:20 AM, he suffered cardiac arrest requiring ACLS and intubation.  He was transferred back to ICU.  Post rewarming, patient has had no improvement in mental status with MRI consistent with hypoxic encephalopathy.  He is status post tracheostomy/PEG tube placement on 06/24/2019.  He is still on a ventilator via tracheostomy.  He was transferred to [facility] service on 06/28/2019.  PCCM will keep following for tracheostomy and vent.  Palliative care has evaluated the patient.  Patient remains full code.  Assessment & Plan:   Acute hypoxic respiratory failure status post intubation and subsequent tracheostomy on a ventilator Concern for aspiration event with ongoing issues with swallowing and secretion management Obstructive sleep apnea, noncompliant with treatment at baseline Status post PEG tube placement -Patient has had a long hospitalization and could not be liberated from the ventilator because of mental status changes.  Subsequently tracheostomy and PEG tube were placed on 06/24/2019.  Transferred to [facility] service on 06/28/2019.  PCCM will keep following for vent/tracheostomy management. -Continue tube feeding as per dietitian  recommendations. -Currently afebrile and hemodynamically stable.  Cardiac arrest, unclear etiology Intermittent bradycardia -Status post bradycardic arrest lasting for about 12 minutes on 06/10/2019.  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 -Palliative care has had multiple discussions with the family.  Patient remains full code.  Overall prognosis is poor.  Consider hospice/comfort measures.  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  Continue Norvasc  Acute on chronic anemia of chronic disease -Transfused 2 units of packed red cells on 06/26/2019.  Monitor hemoglobin.  Transfuse if hemoglobin less than 7.  Hemoglobin 7.9 today.  Hypernatremia -Monitor.  Continue free water via tube.    DVT prophylaxis: SCDs.  Subcutaneous heparin held since 06/23/2019 because of oozing around tracheostomy and PEG tube site Code Status: Full Family Communication: None at bedside Disposition Plan: Depends on clinical outcome Consultants: PCCM/vascular surgery/palliative care/general surgery/cardiology/neurology  Procedures:  Endotracheal tube: 06/07/2019-06/08/2019; 06/10/2019 onwards Tracheostomy and PEG tube placement on 06/24/2019 CVC left IJ: 06/10/2019-06/15/2019  Antimicrobials:  Anti-infectives (From admission, onward)   Start     Dose/Rate Route Frequency Ordered Stop   06/14/19 0600  vancomycin (VANCOCIN) 1,500 mg in sodium chloride 0.9 % 500 mL IVPB  Status:  Discontinued     1,500 mg 250 mL/hr over 120 Minutes Intravenous Every 24 hours 06/13/19 1207 06/13/19 1220   06/13/19 1800  vancomycin (VANCOCIN) 1,500 mg in sodium chloride 0.9 % 500 mL IVPB  Status:  Discontinued     1,500 mg 250 mL/hr over 120 Minutes Intravenous Every 24 hours 06/13/19 1220  06/14/19 0944   06/10/19 0615  vancomycin (VANCOCIN) IVPB 1000 mg/200 mL premix  Status:  Discontinued     1,000 mg 200 mL/hr over 60 Minutes Intravenous Every 12 hours 06/10/19 0605 06/13/19 1207   06/10/19 0615  ceFEPIme (MAXIPIME) 2 g in sodium chloride 0.9 % 100 mL IVPB  Status:  Discontinued     2 g 200 mL/hr over 30 Minutes Intravenous Every 8 hours 06/10/19 0605 06/14/19 0944       Subjective: Patient seen and examined at bedside.  No overnight fever, vomiting noted.  Unresponsive on the ventilator. Objective: Vitals:   06/30/19 0400 06/30/19 0404 06/30/19 0500 06/30/19 0600  BP: 118/67 127/69 121/67 118/71  Pulse: 72 66 66 68  Resp: '16 16 16 16  '$ Temp:      TempSrc:      SpO2: 99% 100% 99% 100%  Weight:      Height:        Intake/Output Summary (Last 24 hours) at 06/30/2019 0758 Last data filed at 06/30/2019 0600 Gross per 24 hour  Intake 1430.67 ml  Output 2400 ml  Net -969.33 ml   Filed Weights   06/28/19 0405 06/29/19 0409 06/30/19 0338  Weight: 81.7 kg 81 kg 79.2 kg    Examination:  General exam: No distress.  Looks chronically ill.  Unresponsive on a ventilator via tracheostomy.   Neck: Trach present. Respiratory system: Bilateral decreased breath sounds at bases with scattered crackles.   Cardiovascular system: S1-S2 heard, rate controlled Gastrointestinal system: Abdomen is nondistended, soft and PEG tube present. Normal bowel sounds heard. Extremities: No cyanosis; trace lower extremity edema   Data Reviewed: I have personally reviewed following labs and imaging studies  CBC: Recent Labs  Lab 06/24/19 2210  06/26/19 0430 06/26/19 1945 06/28/19 0200 06/29/19 0251 06/30/19 0048  WBC 14.5*   < > 12.4* 13.8* 10.0 8.2 7.6  NEUTROABS 12.7*  --   --   --   --   --   --   HGB 8.8*   < > 7.0* 8.5* 8.1* 8.2* 7.9*  HCT 27.4*   < > 21.6* 25.8* 25.8* 25.3* 25.2*  MCV 84.3   < > 83.4 84.3 87.2 85.8 86.9  PLT 253   < > 232 233 209 192 171   < > =  values in this interval not displayed.   Basic Metabolic Panel: Recent Labs  Lab 06/25/19 0423 06/28/19 0200 06/29/19 0251 06/30/19 0048  NA 140 146* 146* 148*  K 4.3 3.8 3.8 3.7  CL 106 112* 111 112*  CO2 '25 27 27 27  '$ GLUCOSE 128* 123* 127* 175*  BUN 45* 50* 51* 48*  CREATININE 0.95 0.90 0.88 0.92  CALCIUM 8.6* 8.4* 8.6* 8.6*  MG 2.1 2.2 2.3 2.3  PHOS 4.4  --   --   --    GFR: Estimated Creatinine Clearance: 80.8 mL/min (by C-G formula based on SCr of 0.92 mg/dL). Liver Function Tests: Recent Labs  Lab 06/28/19 0200 06/29/19 0251 06/30/19 0048  AST 29 33 32  ALT '9 9 10  '$ ALKPHOS 78 85 86  BILITOT 1.0 1.1 0.8  PROT 5.6* 5.9* 5.7*  ALBUMIN 2.2* 2.2* 2.2*   No results for input(s): LIPASE, AMYLASE in the last 168 hours. No results for input(s): AMMONIA in the last 168 hours. Coagulation Profile: Recent Labs  Lab 06/24/19 2210  INR 1.2   Cardiac Enzymes: No results for input(s): CKTOTAL, CKMB, CKMBINDEX, TROPONINI in the last 168 hours. BNP (last 3 results) No results for input(s): PROBNP  in the last 8760 hours. HbA1C: No results for input(s): HGBA1C in the last 72 hours. CBG: Recent Labs  Lab 06/29/19 1103 06/29/19 1533 06/29/19 1942 06/29/19 2306 06/30/19 0330  GLUCAP 104* 119* 107* 166* 116*   Lipid Profile: No results for input(s): CHOL, HDL, LDLCALC, TRIG, CHOLHDL, LDLDIRECT in the last 72 hours. Thyroid Function Tests: No results for input(s): TSH, T4TOTAL, FREET4, T3FREE, THYROIDAB in the last 72 hours. Anemia Panel: No results for input(s): VITAMINB12, FOLATE, FERRITIN, TIBC, IRON, RETICCTPCT in the last 72 hours. Sepsis Labs: No results for input(s): PROCALCITON, LATICACIDVEN in the last 168 hours.  No results found for this or any previous visit (from the past 240 hour(s)).       Radiology Studies: No results found.      Scheduled Meds: . amLODipine  10 mg Per Tube Daily  . Carbidopa-Levodopa ER  1 tablet Per Tube 5 X Daily  .  chlorhexidine gluconate (MEDLINE KIT)  15 mL Mouth Rinse BID  . Chlorhexidine Gluconate Cloth  6 each Topical Daily  . docusate  50 mg Per Tube Daily  . feeding supplement (OSMOLITE 1.5 CAL)  474 mL Per Tube TID  . feeding supplement (PRO-STAT SUGAR FREE 64)  30 mL Per Tube TID  . free water  250 mL Per Tube Q8H  . insulin aspart  0-15 Units Subcutaneous TID WC  . mouth rinse  15 mL Mouth Rinse 10 times per [place]  . pantoprazole sodium  40 mg Per Tube Daily  . Pimavanserin Tartrate  34 mg Per Tube Daily  . pyridostigmine  60 mg Per Tube BID  . rosuvastatin  10 mg Per Tube q1800   Continuous Infusions: . sodium chloride Stopped (06/10/19 0000)  . sodium chloride Stopped (06/21/19 1744)          Aline August, MD [facility] 06/30/2019, 7:58 AM

## 2019-07-01 DIAGNOSIS — Z515 Encounter for palliative care: Secondary | ICD-10-CM | POA: Diagnosis not present

## 2019-07-01 DIAGNOSIS — Z7189 Other specified counseling: Secondary | ICD-10-CM | POA: Diagnosis not present

## 2019-07-01 DIAGNOSIS — G9340 Encephalopathy, unspecified: Secondary | ICD-10-CM | POA: Diagnosis not present

## 2019-07-01 DIAGNOSIS — J9601 Acute respiratory failure with hypoxia: Secondary | ICD-10-CM | POA: Diagnosis not present

## 2019-07-01 DIAGNOSIS — I469 Cardiac arrest, cause unspecified: Secondary | ICD-10-CM | POA: Diagnosis not present

## 2019-07-01 DIAGNOSIS — G20 Parkinson's disease: Secondary | ICD-10-CM | POA: Diagnosis not present

## 2019-07-01 LAB — CBC WITH DIFFERENTIAL/PLATELET
Abs Immature Granulocytes: 0.04 10*3/uL (ref 0.00–0.07)
Basophils Absolute: 0.1 10*3/uL (ref 0.0–0.1)
Basophils Relative: 1 %
Eosinophils Absolute: 0.5 10*3/uL (ref 0.0–0.5)
Eosinophils Relative: 6 %
HCT: 27.5 % — ABNORMAL LOW (ref 39.0–52.0)
Hemoglobin: 8.6 g/dL — ABNORMAL LOW (ref 13.0–17.0)
Immature Granulocytes: 0 %
Lymphocytes Relative: 12 %
Lymphs Abs: 1.1 10*3/uL (ref 0.7–4.0)
MCH: 27.5 pg (ref 26.0–34.0)
MCHC: 31.3 g/dL (ref 30.0–36.0)
MCV: 87.9 fL (ref 80.0–100.0)
Monocytes Absolute: 0.8 10*3/uL (ref 0.1–1.0)
Monocytes Relative: 9 %
Neutro Abs: 6.8 10*3/uL (ref 1.7–7.7)
Neutrophils Relative %: 72 %
Platelets: 185 10*3/uL (ref 150–400)
RBC: 3.13 MIL/uL — ABNORMAL LOW (ref 4.22–5.81)
RDW: 14.8 % (ref 11.5–15.5)
WBC: 9.4 10*3/uL (ref 4.0–10.5)
nRBC: 0 % (ref 0.0–0.2)

## 2019-07-01 LAB — BASIC METABOLIC PANEL
Anion gap: 11 (ref 5–15)
BUN: 52 mg/dL — ABNORMAL HIGH (ref 8–23)
CO2: 29 mmol/L (ref 22–32)
Calcium: 8.9 mg/dL (ref 8.9–10.3)
Chloride: 111 mmol/L (ref 98–111)
Creatinine, Ser: 0.9 mg/dL (ref 0.61–1.24)
GFR calc Af Amer: >60 mL/min (ref >60)
GFR calc non Af Amer: >60 mL/min (ref >60)
Glucose, Bld: 122 mg/dL — ABNORMAL HIGH (ref 70–99)
Potassium: 4.5 mmol/L (ref 3.5–5.1)
Sodium: 151 mmol/L — ABNORMAL HIGH (ref 135–145)

## 2019-07-01 LAB — GLUCOSE, CAPILLARY
Glucose-Capillary: 106 mg/dL — ABNORMAL HIGH (ref 70–99)
Glucose-Capillary: 106 mg/dL — ABNORMAL HIGH (ref 70–99)
Glucose-Capillary: 118 mg/dL — ABNORMAL HIGH (ref 70–99)
Glucose-Capillary: 121 mg/dL — ABNORMAL HIGH (ref 70–99)
Glucose-Capillary: 130 mg/dL — ABNORMAL HIGH (ref 70–99)
Glucose-Capillary: 133 mg/dL — ABNORMAL HIGH (ref 70–99)
Glucose-Capillary: 146 mg/dL — ABNORMAL HIGH (ref 70–99)

## 2019-07-01 LAB — MAGNESIUM: Magnesium: 2.4 mg/dL (ref 1.7–2.4)

## 2019-07-01 MED ORDER — FREE WATER
400.0000 mL | Freq: Three times a day (TID) | Status: DC
  Administered 2019-07-01 – 2019-07-11 (×30): 400 mL

## 2019-07-01 NOTE — Progress Notes (Signed)
PROGRESS NOTE    Derek Harrell    Code Status: Full Code  [id number] DOB: 14-Feb-1946 DOA: 06/06/2019  PCP: [name], MD    Hospital Summary  73 year old male with history of Parkinson's dementia, hypertension, hyperlipidemia, OSA (did not tolerate CPAP), AAA, history of recent endovascular repair of aortic aneurysm 3 days prior to presentation on 06/02/2019, prior unspecified CVA, cellulitis, restless leg syndrome presented on 06/06/2019 with worsening weakness, mental status change, hypertension and fever.  He was admitted to ICU, developed third-degree heart block with agonal respirations with thick mucus suctioned requiring intubation.  He was extubated on 06/08/2019.  Transferred out to the floor on 06/09/2019.  On 11 6:20 AM, he suffered cardiac arrest requiring ACLS and intubation.  He was transferred back to ICU.  Post rewarming, patient has had no improvement in mental status with MRI consistent with hypoxic encephalopathy.  He is status post tracheostomy/PEG tube placement on 06/24/2019.  He is still on a ventilator via tracheostomy.  He was transferred to [facility] service on 06/28/2019.  PCCM will keep following for tracheostomy and vent.  Palliative care has evaluated the patient.  Patient remains full code.  A & P   Active Problems:   Hypertensive emergency without congestive heart failure   Pressure injury of skin   Goals of care, counseling/discussion   Hypoxia   Encephalopathy acute   Dementia without behavioral disturbance (HCC)   Cardiac arrest (HCC)   Acute respiratory failure with hypoxia (HCC)   Palliative care by specialist   Ventilator dependent ([place])  Acute hypoxic respiratory failure status post intubation and subsequent tracheostomy on ventilator Concern for aspiration event with ongoing issues with swallowing and secretion management Obstructive sleep apnea, noncompliant with treatment at baseline Status post PEG tube placement -Patient has had a long  hospitalization and could not be liberated from the ventilator because of mental status changes.  Subsequently tracheostomy and PEG tube were placed on 06/24/2019. Transferred to [facility] service on 06/28/2019.   -PCCM will keep following for vent/tracheostomy management. -Continue tube feeding as per dietitian recommendations. -Currently afebrile and hemodynamically stable.   Cardiac arrest, unclear etiology Intermittent bradycardia -Status post bradycardic arrest lasting for about 12 minutes on 06/10/2019.  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 -Palliative care has had multiple discussions with the family.  Patient remains full code.  Overall prognosis is poor.  Consider hospice/comfort measures.  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  Continue Norvasc  Acute on chronic anemia of chronic disease -Transfused 2 units of packed red cells on 06/26/2019.  Monitor hemoglobin.  Transfuse if hemoglobin less than 7.  Hemoglobin 8.6 today.  Hypernatremia -increase free water    DVT prophylaxis: SCD Diet: Tube feeds Family Communication: Patient's wife has been updated by phone. We had an extensive conversation today by phone. She is very concerned about the care the patient has had in the hospital since he was admitted and is hoping to have her husband discharged to a facility in the near future. I expressed my condolences and understanding about the patient's current status. After some time, we agreed to discuss the patient's plan further in person once she gets to the hospital, hopefully later today.  Disposition Plan: Poor prognosis. Plan for discharge to SNF vs. Comfort measures, pending further [place] discussion.  Consultants  Palliative PCCM  General  surgery Cardiology Neurology Vascular surgery  Procedures  Endotracheal tube: 06/07/2019-06/08/2019; 06/10/2019 onwards Tracheostomy and PEG tube placement on 06/24/2019 CVC left IJ: 06/10/2019-06/15/2019   Antibiotics   Anti-infectives (From admission, onward)   Start     Dose/Rate Route Frequency Ordered Stop   06/14/19 0600  vancomycin (VANCOCIN) 1,500 mg in sodium chloride 0.9 % 500 mL IVPB  Status:  Discontinued     1,500 mg 250 mL/hr over 120 Minutes Intravenous Every 24 hours 06/13/19 1207 06/13/19 1220   06/13/19 1800  vancomycin (VANCOCIN) 1,500 mg in sodium chloride 0.9 % 500 mL IVPB  Status:  Discontinued     1,500 mg 250 mL/hr over 120 Minutes Intravenous Every 24 hours 06/13/19 1220 06/14/19 0944   06/10/19 0615  vancomycin (VANCOCIN) IVPB 1000 mg/200 mL premix  Status:  Discontinued     1,000 mg 200 mL/hr over 60 Minutes Intravenous Every 12 hours 06/10/19 0605 06/13/19 1207   06/10/19 0615  ceFEPIme (MAXIPIME) 2 g in sodium chloride 0.9 % 100 mL IVPB  Status:  Discontinued     2 g 200 mL/hr over 30 Minutes Intravenous Every 8 hours 06/10/19 0605 06/14/19 0944           Subjective   Patient seen and examined.  Due to his clinical status he is unable to provide history.  Objective   Vitals:   07/01/19 1130 07/01/19 1145 07/01/19 1200 07/01/19 1300  BP:   (!) 122/57 126/65  Pulse:   (!) 41 (!) 41  Resp:   19 20  Temp:  98.6 F (37 C)    TempSrc:  Oral    SpO2: 99%  100% 100%  Weight:      Height:        Intake/Output Summary (Last 24 hours) at 07/01/2019 1337 Last data filed at 07/01/2019 1300 Gross per 24 hour  Intake 540 ml  Output 1550 ml  Net -1010 ml   Filed Weights   06/29/19 0409 06/30/19 0338 07/01/19 0339  Weight: 81 kg 79.2 kg 79.7 kg    Examination:  Physical Exam Vitals signs and nursing note reviewed.  Constitutional:      General: He is not in acute distress.    Appearance: He is ill-appearing.     Comments: Trach in  place.   HENT:     Mouth/Throat:     Comments: Secretions Cardiovascular:     Rate and Rhythm: Normal rate.     Heart sounds: Murmur present.  Pulmonary:     Breath sounds: No wheezing or rales.     Comments: Difficult to auscultate Patient was able to take a deep breath on command Abdominal:     General: There is no distension.  Musculoskeletal:     Comments: 0/5 bilateral upper/lower muscle strength. Does not respond to when prompted to move extremities  Skin:    General: Skin is warm.  Neurological:     Comments: Seems at new baseline Nonverbal Minimal responsiveness     Data Reviewed: I have personally reviewed following labs and imaging studies  CBC: Recent Labs  Lab 06/24/19 2210  06/26/19 1945 06/28/19 0200 06/29/19 0251 06/30/19 0048 07/01/19 0430  WBC 14.5*   < > 13.8* 10.0 8.2 7.6 9.4  NEUTROABS 12.7*  --   --   --   --   --  6.8  HGB 8.8*   < > 8.5* 8.1* 8.2* 7.9* 8.6*  HCT 27.4*   < > 25.8* 25.8* 25.3* 25.2*  27.5*  MCV 84.3   < > 84.3 87.2 85.8 86.9 87.9  PLT 253   < > 233 209 192 171 185   < > = values in this interval not displayed.   Basic Metabolic Panel: Recent Labs  Lab 06/25/19 0423 06/28/19 0200 06/29/19 0251 06/30/19 0048 07/01/19 0430  NA 140 146* 146* 148* 151*  K 4.3 3.8 3.8 3.7 4.5  CL 106 112* 111 112* 111  CO2 _0 GLUCOSE 128* 123* 127* 175* 122*  BUN 45* 50* 51* 48* 52*  CREATININE 0.95 0.90 0.88 0.92 0.90  CALCIUM 8.6* 8.4* 8.6* 8.6* 8.9  MG 2.1 2.2 2.3 2.3 2.4  PHOS 4.4  --   --   --   --    GFR: Estimated Creatinine Clearance: 82.6 mL/min (by C-G formula based on SCr of 0.9 mg/dL). Liver Function Tests: Recent Labs  Lab 06/28/19 0200 06/29/19 0251 06/30/19 0048  AST 29 33 32  ALT _1 ALKPHOS 78 85 86  BILITOT 1.0 1.1 0.8  PROT 5.6* 5.9* 5.7*  ALBUMIN 2.2* 2.2* 2.2*   No results for input(s): LIPASE, AMYLASE in the last 168 hours. No results for input(s): AMMONIA in the last 168 hours.  Coagulation Profile: Recent Labs  Lab 06/24/19 2210  INR 1.2   Cardiac Enzymes: No results for input(s): CKTOTAL, CKMB, CKMBINDEX, TROPONINI in the last 168 hours. BNP (last 3 results) No results for input(s): PROBNP in the last 8760 hours. HbA1C: No results for input(s): HGBA1C in the last 72 hours. CBG: Recent Labs  Lab 06/30/19 1942 06/30/19 2349 07/01/19 0339 07/01/19 0813 07/01/19 1148  GLUCAP 148* 106* 121* 106* 130*   Lipid Profile: No results for input(s): CHOL, HDL, LDLCALC, TRIG, CHOLHDL, LDLDIRECT in the last 72 hours. Thyroid Function Tests: No results for input(s): TSH, T4TOTAL, FREET4, T3FREE, THYROIDAB in the last 72 hours. Anemia Panel: No results for input(s): VITAMINB12, FOLATE, FERRITIN, TIBC, IRON, RETICCTPCT in the last 72 hours. Sepsis Labs: No results for input(s): PROCALCITON, LATICACIDVEN in the last 168 hours.  No results found for this or any previous visit (from the past 240 hour(s)).       Radiology Studies: No results found.      Scheduled Meds: . amLODipine  10 mg Per Tube Daily  . Carbidopa-Levodopa ER  1 tablet Per Tube 5 X Daily  . chlorhexidine gluconate (MEDLINE KIT)  15 mL Mouth Rinse BID  . Chlorhexidine Gluconate Cloth  6 each Topical Daily  . docusate  50 mg Per Tube Daily  . feeding supplement (OSMOLITE 1.5 CAL)  474 mL Per Tube TID  . feeding supplement (PRO-STAT SUGAR FREE 64)  30 mL Per Tube TID  . free water  400 mL Per Tube Q8H  . insulin aspart  0-15 Units Subcutaneous TID WC  . mouth rinse  15 mL Mouth Rinse 10 times per [place]  . pantoprazole sodium  40 mg Per Tube Daily  . Pimavanserin Tartrate  34 mg Per Tube Daily  . pyridostigmine  60 mg Per Tube BID  . rosuvastatin  10 mg Per Tube q1800   Continuous Infusions: . sodium chloride Stopped (06/10/19 0000)  . sodium chloride Stopped (06/21/19 1744)     LOS: 25 days    Time spent: 35 minutes with over 50% of the time coordinating the patient's care     Harold Hedge, DO [facility] Pager [phone]  If 7PM-7AM, please contact night-coverage [email or web address]  Password [email or web address] 07/01/2019, 1:37 PM

## 2019-07-01 NOTE — Progress Notes (Signed)
SLP Cancellation Note  Patient Details Name: Derek Harrell MRN: [id number] DOB: 26-Oct-1945   Cancelled treatment:       Reason Eval/Treat Not Completed: Medical issues which prohibited therapy   Talbert Nan 07/01/2019, 7:21 AM  Pollyann Glen, M.A. [place] [employer] education officer [phone] Office [phone]

## 2019-07-01 NOTE — Progress Notes (Signed)
Palliative: Mr. Kallmeyer is resting quietly in bed.  He remains unchanged, and does not respond to me in any meaningful way.  He is unable to make his basic needs known. There is no family at bedside at this time.   Conference with bedside nursing staff and [facility] team related to patient condition, needs and disposition.    Plan:  GOALS set for full code/full scope.  Working towards long term vent placement. PMT to continue to shadow for declines.   65 minutes   [name], NP Palliative Medicine Team Team Phone # [phone] Greater than 50% of this time was spent counseling and coordinating care related to the above assessment and plan

## 2019-07-02 DIAGNOSIS — I469 Cardiac arrest, cause unspecified: Secondary | ICD-10-CM | POA: Diagnosis not present

## 2019-07-02 DIAGNOSIS — G20 Parkinson's disease: Secondary | ICD-10-CM | POA: Diagnosis not present

## 2019-07-02 DIAGNOSIS — Z7189 Other specified counseling: Secondary | ICD-10-CM | POA: Diagnosis not present

## 2019-07-02 DIAGNOSIS — Z515 Encounter for palliative care: Secondary | ICD-10-CM | POA: Diagnosis not present

## 2019-07-02 LAB — CBC
HCT: 26.8 % — ABNORMAL LOW (ref 39.0–52.0)
Hemoglobin: 8.3 g/dL — ABNORMAL LOW (ref 13.0–17.0)
MCH: 27.3 pg (ref 26.0–34.0)
MCHC: 31 g/dL (ref 30.0–36.0)
MCV: 88.2 fL (ref 80.0–100.0)
Platelets: 189 10*3/uL (ref 150–400)
RBC: 3.04 MIL/uL — ABNORMAL LOW (ref 4.22–5.81)
RDW: 14.8 % (ref 11.5–15.5)
WBC: 10.2 10*3/uL (ref 4.0–10.5)
nRBC: 0 % (ref 0.0–0.2)

## 2019-07-02 LAB — BASIC METABOLIC PANEL
Anion gap: 12 (ref 5–15)
BUN: 54 mg/dL — ABNORMAL HIGH (ref 8–23)
CO2: 27 mmol/L (ref 22–32)
Calcium: 8.5 mg/dL — ABNORMAL LOW (ref 8.9–10.3)
Chloride: 109 mmol/L (ref 98–111)
Creatinine, Ser: 0.96 mg/dL (ref 0.61–1.24)
GFR calc Af Amer: >60 mL/min (ref >60)
GFR calc non Af Amer: >60 mL/min (ref >60)
Glucose, Bld: 120 mg/dL — ABNORMAL HIGH (ref 70–99)
Potassium: 4.7 mmol/L (ref 3.5–5.1)
Sodium: 148 mmol/L — ABNORMAL HIGH (ref 135–145)

## 2019-07-02 LAB — GLUCOSE, CAPILLARY
Glucose-Capillary: 100 mg/dL — ABNORMAL HIGH (ref 70–99)
Glucose-Capillary: 129 mg/dL — ABNORMAL HIGH (ref 70–99)
Glucose-Capillary: 134 mg/dL — ABNORMAL HIGH (ref 70–99)
Glucose-Capillary: 156 mg/dL — ABNORMAL HIGH (ref 70–99)
Glucose-Capillary: 121 mg/dL — ABNORMAL HIGH (ref 70–99)

## 2019-07-02 MED ORDER — PYRIDOSTIGMINE BROMIDE 60 MG/5ML PO SOLN
60.0000 mg | Freq: Every day | ORAL | Status: DC
  Administered 2019-07-03 – 2019-07-06 (×4): 60 mg
  Filled 2019-07-02 (×4): qty 5

## 2019-07-02 NOTE — Progress Notes (Signed)
PROGRESS NOTE    Derek Harrell    Code Status: Full Code  [id number] DOB: 12/29/1945 DOA: 06/06/2019  PCP: [name], MD    Hospital Summary  73 year old male with history of Parkinson's dementia, hypertension, hyperlipidemia, OSA (did not tolerate CPAP), AAA, history of recent endovascular repair of aortic aneurysm 3 days prior to presentation on 06/02/2019, prior unspecified CVA, cellulitis, restless leg syndrome presented on 06/06/2019 with worsening weakness, mental status change, hypertension and fever.  He was admitted to ICU, developed third-degree heart block with agonal respirations with thick mucus suctioned requiring intubation.  He was extubated on 06/08/2019.  Transferred out to the floor on 06/09/2019.  On 11 6:20 AM, he suffered cardiac arrest requiring ACLS and intubation.  He was transferred back to ICU.  Post rewarming, patient has had no improvement in mental status with MRI consistent with hypoxic encephalopathy.  He is status post tracheostomy/PEG tube placement on 06/24/2019.  He is still on a ventilator via tracheostomy.  He was transferred to [facility] service on 06/28/2019.  PCCM will keep following for tracheostomy and vent.  Palliative care has evaluated the patient.  Patient remains full code.  A & P   Active Problems:   Hypertensive emergency without congestive heart failure   Pressure injury of skin   Goals of care, counseling/discussion   Hypoxia   Encephalopathy acute   Dementia without behavioral disturbance (HCC)   Cardiac arrest (HCC)   Acute respiratory failure with hypoxia (HCC)   Palliative care by specialist   Ventilator dependent ([place])  Acute hypoxic respiratory failure status post intubation and subsequent tracheostomy on ventilator Concern for aspiration event with ongoing issues with swallowing and secretion management Obstructive sleep apnea, noncompliant with treatment at baseline Status post PEG tube placement -Patient has had a long  hospitalization and could not be liberated from the ventilator because of mental status changes.  Subsequently tracheostomy and PEG tube were placed on 06/24/2019. Transferred to [facility] service on 06/28/2019.   -PCCM will keep following for vent/tracheostomy management. -Continue tube feeding as per dietitian recommendations. -Currently afebrile and hemodynamically stable. -GOC discussion with wife at bedside yesterday and discussed comfort measures, family still pursuing discharge to SNF but will consider comfort measures.    Cardiac arrest, unclear etiology Intermittent bradycardia Pyridostigmine (Mestinon) can cause cardiac arrhythmia, bradycardia and cardiac arrest, respiratory arrest possibly the mechanism behind the patient's symptoms? -Status post bradycardic arrest lasting for about 12 minutes on 06/10/2019.  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 -Palliative care has had multiple discussions with the family.  Patient remains full code.  Overall prognosis is poor.   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  Hypertension  -Blood pressure stable.  Continue Norvasc  Acute on chronic anemia of chronic disease -Transfused 2 units of packed red cells on 06/26/2019.  Monitor hemoglobin.  Transfuse if hemoglobin less than 7.  Hemoglobin 8.6 today.  Hypernatremia -increase free water    DVT prophylaxis: SCD Diet: Tube feeds Family Communication: Had a long conversation with the patient's wife yesterday over the phone and at bedside later in the evening.  We had a long talk about patient, his wishes and goals of care and approach the subject of comfort measures which she was initially resistant to but started to consider and agreed to start  considering  over the next several days however  Unfortunately it seems the patient does not have a power of attorney at this time.  Patient's wife is trying to work on getting power of attorney.  Patient does have two adult children who were not at bedside.   Disposition Plan: Poor prognosis. Plan for discharge to SNF vs. Comfort measures, pending further GOC discussion and POA  Consultants  Palliative PCCM General surgery Cardiology Neurology Vascular surgery  Procedures  Endotracheal tube: 06/07/2019-06/08/2019; 06/10/2019 onwards Tracheostomy and PEG tube placement on 06/24/2019 CVC left IJ: 06/10/2019-06/15/2019   Antibiotics   Anti-infectives (From admission, onward)   Start     Dose/Rate Route Frequency Ordered Stop   06/14/19 0600  vancomycin (VANCOCIN) 1,500 mg in sodium chloride 0.9 % 500 mL IVPB  Status:  Discontinued     1,500 mg 250 mL/hr over 120 Minutes Intravenous Every 24 hours 06/13/19 1207 06/13/19 1220   06/13/19 1800  vancomycin (VANCOCIN) 1,500 mg in sodium chloride 0.9 % 500 mL IVPB  Status:  Discontinued     1,500 mg 250 mL/hr over 120 Minutes Intravenous Every 24 hours 06/13/19 1220 06/14/19 0944   06/10/19 0615  vancomycin (VANCOCIN) IVPB 1000 mg/200 mL premix  Status:  Discontinued     1,000 mg 200 mL/hr over 60 Minutes Intravenous Every 12 hours 06/10/19 0605 06/13/19 1207   06/10/19 0615  ceFEPIme (MAXIPIME) 2 g in sodium chloride 0.9 % 100 mL IVPB  Status:  Discontinued     2 g 200 mL/hr over 30 Minutes Intravenous Every 8 hours 06/10/19 0605 06/14/19 0944           Subjective   Seen and examined at bedside, no clinical improvement.  Unable to provide history.  Objective   Vitals:   07/02/19 0800 07/02/19 0900 07/02/19 1148 07/02/19 1217  BP: 118/68 122/69    Pulse: 65 69  76  Resp: _0 Temp:   99.1 F (37.3 C)   TempSrc:   Axillary   SpO2: 99% 100%  98%  Weight:      Height:        Intake/Output Summary (Last 24 hours) at 07/02/2019  1249 Last data filed at 07/02/2019 0900 Gross per 24 hour  Intake 1404 ml  Output 1275 ml  Net 129 ml   Filed Weights   06/30/19 0338 07/01/19 0339 07/01/19 2200  Weight: 79.2 kg 79.7 kg 79.5 kg    Examination:  Physical Exam Vitals signs and nursing note reviewed.  Constitutional:      Appearance: He is ill-appearing.     Comments: Trach  HENT:     Head: Normocephalic and atraumatic.     Nose: Nose normal.     Mouth/Throat:     Mouth: Mucous membranes are moist.  Eyes:     Extraocular Movements: Extraocular movements intact.  Neck:     Musculoskeletal: Normal range of motion. No neck rigidity.  Cardiovascular:     Rate and Rhythm: Regular rhythm. Bradycardia present.     Heart sounds: Murmur present.  Pulmonary:     Effort: Pulmonary effort is normal.     Breath sounds: Normal breath sounds.  Abdominal:     General: Abdomen is flat.     Palpations: Abdomen is soft.  Musculoskeletal: Normal range of motion.        General: No swelling.  Neurological:     Comments: Able to respond to commands when prompted to take deep breath but  unable to move extremities     Data Reviewed: I have personally reviewed following labs and imaging studies  CBC: Recent Labs  Lab 06/28/19 0200 06/29/19 0251 06/30/19 0048 07/01/19 0430 07/02/19 0256  WBC 10.0 8.2 7.6 9.4 10.2  NEUTROABS  --   --   --  6.8  --   HGB 8.1* 8.2* 7.9* 8.6* 8.3*  HCT 25.8* 25.3* 25.2* 27.5* 26.8*  MCV 87.2 85.8 86.9 87.9 88.2  PLT 209 192 171 185 366   Basic Metabolic Panel: Recent Labs  Lab 06/28/19 0200 06/29/19 0251 06/30/19 0048 07/01/19 0430 07/02/19 0256  NA 146* 146* 148* 151* 148*  K 3.8 3.8 3.7 4.5 4.7  CL 112* 111 112* 111 109  CO2 _0 GLUCOSE 123* 127* 175* 122* 120*  BUN 50* 51* 48* 52* 54*  CREATININE 0.90 0.88 0.92 0.90 0.96  CALCIUM 8.4* 8.6* 8.6* 8.9 8.5*  MG 2.2 2.3 2.3 2.4  --    GFR: Estimated Creatinine Clearance: 77.5 mL/min (by C-G formula based on  SCr of 0.96 mg/dL). Liver Function Tests: Recent Labs  Lab 06/28/19 0200 06/29/19 0251 06/30/19 0048  AST 29 33 32  ALT _1 ALKPHOS 78 85 86  BILITOT 1.0 1.1 0.8  PROT 5.6* 5.9* 5.7*  ALBUMIN 2.2* 2.2* 2.2*   No results for input(s): LIPASE, AMYLASE in the last 168 hours. No results for input(s): AMMONIA in the last 168 hours. Coagulation Profile: No results for input(s): INR, PROTIME in the last 168 hours. Cardiac Enzymes: No results for input(s): CKTOTAL, CKMB, CKMBINDEX, TROPONINI in the last 168 hours. BNP (last 3 results) No results for input(s): PROBNP in the last 8760 hours. HbA1C: No results for input(s): HGBA1C in the last 72 hours. CBG: Recent Labs  Lab 07/01/19 1541 07/01/19 1924 07/01/19 2315 07/02/19 0811 07/02/19 1150  GLUCAP 118* 146* 133* 100* 129*   Lipid Profile: No results for input(s): CHOL, HDL, LDLCALC, TRIG, CHOLHDL, LDLDIRECT in the last 72 hours. Thyroid Function Tests: No results for input(s): TSH, T4TOTAL, FREET4, T3FREE, THYROIDAB in the last 72 hours. Anemia Panel: No results for input(s): VITAMINB12, FOLATE, FERRITIN, TIBC, IRON, RETICCTPCT in the last 72 hours. Sepsis Labs: No results for input(s): PROCALCITON, LATICACIDVEN in the last 168 hours.  No results found for this or any previous visit (from the past 240 hour(s)).       Radiology Studies: No results found.      Scheduled Meds: . amLODipine  10 mg Per Tube Daily  . Carbidopa-Levodopa ER  1 tablet Per Tube 5 X Daily  . chlorhexidine gluconate (MEDLINE KIT)  15 mL Mouth Rinse BID  . Chlorhexidine Gluconate Cloth  6 each Topical Daily  . docusate  50 mg Per Tube Daily  . feeding supplement (OSMOLITE 1.5 CAL)  474 mL Per Tube TID  . feeding supplement (PRO-STAT SUGAR FREE 64)  30 mL Per Tube TID  . free water  400 mL Per Tube Q8H  . insulin aspart  0-15 Units Subcutaneous TID WC  . mouth rinse  15 mL Mouth Rinse 10 times per [place]  . pantoprazole sodium  40 mg Per  Tube Daily  . Pimavanserin Tartrate  34 mg Per Tube Daily  . pyridostigmine  60 mg Per Tube BID  . rosuvastatin  10 mg Per Tube q1800   Continuous Infusions: . sodium chloride Stopped (06/10/19 0000)  . sodium chloride Stopped (06/21/19 1744)     LOS: 26  days    Time spent: 30 minutes with over 50% of the time coordinating the patient's care    Harold Hedge, DO [facility] Pager [phone]  If 7PM-7AM, please contact night-coverage [email or web address] Password [facility] 07/02/2019, 12:49 PM

## 2019-07-02 NOTE — Plan of Care (Signed)
  Problem: Nutrition: Goal: Adequate nutrition will be maintained Outcome: Progressing Note: Pt tolerating bolus feeds well   Problem: Elimination: Goal: Will not experience complications related to bowel motility Outcome: Progressing Goal: Will not experience complications related to urinary retention Outcome: Progressing   Problem: Pain Managment: Goal: General experience of comfort will improve Outcome: Progressing   Problem: Activity: Goal: Risk for activity intolerance will decrease Outcome: Not Progressing Note: Pt bedbound

## 2019-07-03 DIAGNOSIS — I469 Cardiac arrest, cause unspecified: Secondary | ICD-10-CM | POA: Diagnosis not present

## 2019-07-03 DIAGNOSIS — Z430 Encounter for attention to tracheostomy: Secondary | ICD-10-CM

## 2019-07-03 DIAGNOSIS — J9601 Acute respiratory failure with hypoxia: Secondary | ICD-10-CM | POA: Diagnosis not present

## 2019-07-03 DIAGNOSIS — G9340 Encephalopathy, unspecified: Secondary | ICD-10-CM | POA: Diagnosis not present

## 2019-07-03 LAB — CBC
HCT: 26.7 % — ABNORMAL LOW (ref 39.0–52.0)
Hemoglobin: 8.4 g/dL — ABNORMAL LOW (ref 13.0–17.0)
MCH: 27.6 pg (ref 26.0–34.0)
MCHC: 31.5 g/dL (ref 30.0–36.0)
MCV: 87.8 fL (ref 80.0–100.0)
Platelets: 190 10*3/uL (ref 150–400)
RBC: 3.04 MIL/uL — ABNORMAL LOW (ref 4.22–5.81)
RDW: 14.7 % (ref 11.5–15.5)
WBC: 9.1 10*3/uL (ref 4.0–10.5)
nRBC: 0 % (ref 0.0–0.2)

## 2019-07-03 LAB — BASIC METABOLIC PANEL
Anion gap: 14 (ref 5–15)
BUN: 54 mg/dL — ABNORMAL HIGH (ref 8–23)
CO2: 25 mmol/L (ref 22–32)
Calcium: 8.6 mg/dL — ABNORMAL LOW (ref 8.9–10.3)
Chloride: 108 mmol/L (ref 98–111)
Creatinine, Ser: 1.01 mg/dL (ref 0.61–1.24)
GFR calc Af Amer: >60 mL/min (ref >60)
GFR calc non Af Amer: >60 mL/min (ref >60)
Glucose, Bld: 120 mg/dL — ABNORMAL HIGH (ref 70–99)
Potassium: 5.1 mmol/L (ref 3.5–5.1)
Sodium: 147 mmol/L — ABNORMAL HIGH (ref 135–145)

## 2019-07-03 LAB — GLUCOSE, CAPILLARY
Glucose-Capillary: 113 mg/dL — ABNORMAL HIGH (ref 70–99)
Glucose-Capillary: 99 mg/dL (ref 70–99)
Glucose-Capillary: 115 mg/dL — ABNORMAL HIGH (ref 70–99)
Glucose-Capillary: 130 mg/dL — ABNORMAL HIGH (ref 70–99)
Glucose-Capillary: 151 mg/dL — ABNORMAL HIGH (ref 70–99)

## 2019-07-03 NOTE — Progress Notes (Signed)
PROGRESS NOTE    Derek Harrell    Code Status: Full Code  MVH:[id number] DOB: Jan 05, 1946 DOA: 06/06/2019  PCP: [name], MD    Hospital Summary  73 year old male with history of Parkinson's dementia, hypertension, hyperlipidemia, OSA (did not tolerate CPAP), AAA, history of recent endovascular repair of aortic aneurysm 3 days prior to presentation on 06/02/2019, prior unspecified CVA, cellulitis, restless leg syndrome presented on 06/06/2019 with worsening weakness, mental status change, hypertension and fever.  He was admitted to ICU, developed third-degree heart block with agonal respirations with thick mucus suctioned requiring intubation.  He was extubated on 06/08/2019.  Transferred out to the floor on 06/09/2019.  On 11 6:20 AM, he suffered cardiac arrest requiring ACLS and intubation.  He was transferred back to ICU.  Post rewarming, patient has had no improvement in mental status with MRI consistent with hypoxic encephalopathy.  He is status post tracheostomy/PEG tube placement on 06/24/2019.  He is still on a ventilator via tracheostomy.  He was transferred to [facility] service on 06/28/2019.  PCCM will keep following for tracheostomy and vent.  Palliative care has evaluated the patient.  Patient remains full code.  A & P   Active Problems:   Hypertensive emergency without congestive heart failure   Pressure injury of skin   Goals of care, counseling/discussion   Hypoxia   Encephalopathy acute   Dementia without behavioral disturbance (HCC)   Cardiac arrest (HCC)   Acute respiratory failure with hypoxia (HCC)   Palliative care by specialist   Ventilator dependent ([place])  Acute hypoxic respiratory failure status post intubation and subsequent tracheostomy on ventilator Concern for aspiration event with ongoing issues with swallowing and secretion management Obstructive sleep apnea, noncompliant with treatment at baseline Status post PEG tube placement -Patient has had a long  hospitalization and could not be liberated from the ventilator because of mental status changes.  Subsequently tracheostomy and PEG tube were placed on 06/24/2019. Transferred to [facility] service on 06/28/2019.   -PCCM will keep following for vent/tracheostomy management. -Continue tube feeding as per dietitian recommendations. -Currently afebrile and hemodynamically stable. -GOC discussion with wife at bedside yesterday and discussed comfort measures, family still pursuing discharge to SNF but will consider comfort measures.    Cardiac arrest, unclear etiology Intermittent bradycardia Status post bradycardic arrest lasting for about 12 minutes on 06/10/2019.  After rewarming, patient has shown no improvement in mental status Pyridostigmine (Mestinon) can cause cardiac arrhythmia, bradycardia and cardiac arrest, respiratory arrest possibly the mechanism behind the patient's symptoms?  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 -Palliative care has had multiple discussions with the family.  Patient remains full code.  Overall prognosis is poor.   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  Hypertension  -Blood pressure stable.  Continue Norvasc  Acute on chronic anemia of chronic disease -Transfused 2 units of packed red cells on 06/26/2019.  Monitor hemoglobin.  Transfuse if hemoglobin less than 7.  Hemoglobin 8.6 today.  Hypernatremia -increase free water    DVT prophylaxis: SCD Diet: Tube feeds Family Communication: Discussed with wife over the phone today Disposition Plan: Poor prognosis. Plan for discharge to SNF vs. Comfort measures, pending further [place] discussion and POA  Consultants  Palliative PCCM General surgery Cardiology Neurology Vascular surgery  Procedures  Endotracheal tube: 06/07/2019-06/08/2019;  06/10/2019 onwards Tracheostomy and PEG tube placement on 06/24/2019 CVC left  IJ: 06/10/2019-06/15/2019   Antibiotics   Anti-infectives (From admission, onward)   Start     Dose/Rate Route Frequency Ordered Stop   06/14/19 0600  vancomycin (VANCOCIN) 1,500 mg in sodium chloride 0.9 % 500 mL IVPB  Status:  Discontinued     1,500 mg 250 mL/hr over 120 Minutes Intravenous Every 24 hours 06/13/19 1207 06/13/19 1220   06/13/19 1800  vancomycin (VANCOCIN) 1,500 mg in sodium chloride 0.9 % 500 mL IVPB  Status:  Discontinued     1,500 mg 250 mL/hr over 120 Minutes Intravenous Every 24 hours 06/13/19 1220 06/14/19 0944   06/10/19 0615  vancomycin (VANCOCIN) IVPB 1000 mg/200 mL premix  Status:  Discontinued     1,000 mg 200 mL/hr over 60 Minutes Intravenous Every 12 hours 06/10/19 0605 06/13/19 1207   06/10/19 0615  ceFEPIme (MAXIPIME) 2 g in sodium chloride 0.9 % 100 mL IVPB  Status:  Discontinued     2 g 200 mL/hr over 30 Minutes Intravenous Every 8 hours 06/10/19 0605 06/14/19 0944           Subjective   Patient sleeping during exam.  Did not wake up to painful stimuli.  Objective   Vitals:   07/03/19 1030 07/03/19 1133 07/03/19 1400 07/03/19 1513  BP: (!) 96/52 114/69 129/65 120/73  Pulse:  72 71 72  Resp:  20 16 (!) 24  Temp:      TempSrc:      SpO2:  100% 99% 100%  Weight:      Height:        Intake/Output Summary (Last 24 hours) at 07/03/2019 1525 Last data filed at 07/03/2019 0600 Gross per 24 hour  Intake 1394 ml  Output 1975 ml  Net -581 ml   Filed Weights   07/01/19 0339 07/01/19 2200 07/03/19 0304  Weight: 79.7 kg 79.5 kg 79.2 kg    Examination:  Physical Exam Vitals signs and nursing note reviewed.  Constitutional:      Appearance: He is ill-appearing.     Comments: Sleeping  HENT:     Mouth/Throat:     Mouth: Mucous membranes are dry.  Cardiovascular:     Rate and Rhythm: Normal rate and regular rhythm.  Pulmonary:     Effort: No respiratory  distress.     Breath sounds: No wheezing.     Comments: Trach Abdominal:     General: Abdomen is flat.     Palpations: Abdomen is soft.  Musculoskeletal: Normal range of motion.        General: No swelling.  Neurological:     Mental Status: He is alert.     Comments: Encephalopathic Does not withdraw with painful stimuli   Psychiatric:        Mood and Affect: Mood normal.        Behavior: Behavior normal.     Data Reviewed: I have personally reviewed following labs and imaging studies  CBC: Recent Labs  Lab 06/29/19 0251 06/30/19 0048 07/01/19 0430 07/02/19 0256 07/03/19 0312  WBC 8.2 7.6 9.4 10.2 9.1  NEUTROABS  --   --  6.8  --   --   HGB 8.2* 7.9* 8.6* 8.3* 8.4*  HCT 25.3* 25.2* 27.5* 26.8* 26.7*  MCV 85.8 86.9 87.9 88.2 87.8  PLT 192 171 185 189 563   Basic Metabolic Panel: Recent Labs  Lab 06/28/19 0200 06/29/19 0251 06/30/19 0048 07/01/19 0430 07/02/19 0256 07/03/19 0312  NA 146* 146* 148* 151* 148* 147*  K 3.8  3.8 3.7 4.5 4.7 5.1  CL 112* 111 112* 111 109 108  CO2 '27 27 27 29 27 25  '$ GLUCOSE 123* 127* 175* 122* 120* 120*  BUN 50* 51* 48* 52* 54* 54*  CREATININE 0.90 0.88 0.92 0.90 0.96 1.01  CALCIUM 8.4* 8.6* 8.6* 8.9 8.5* 8.6*  MG 2.2 2.3 2.3 2.4  --   --    GFR: Estimated Creatinine Clearance: 73.6 mL/min (by C-G formula based on SCr of 1.01 mg/dL). Liver Function Tests: Recent Labs  Lab 06/28/19 0200 06/29/19 0251 06/30/19 0048  AST 29 33 32  ALT '9 9 10  '$ ALKPHOS 78 85 86  BILITOT 1.0 1.1 0.8  PROT 5.6* 5.9* 5.7*  ALBUMIN 2.2* 2.2* 2.2*   No results for input(s): LIPASE, AMYLASE in the last 168 hours. No results for input(s): AMMONIA in the last 168 hours. Coagulation Profile: No results for input(s): INR, PROTIME in the last 168 hours. Cardiac Enzymes: No results for input(s): CKTOTAL, CKMB, CKMBINDEX, TROPONINI in the last 168 hours. BNP (last 3 results) No results for input(s): PROBNP in the last 8760 hours. HbA1C: No results  for input(s): HGBA1C in the last 72 hours. CBG: Recent Labs  Lab 07/02/19 1949 07/02/19 2307 07/03/19 0347 07/03/19 0804 07/03/19 1230  GLUCAP 156* 121* 113* 99 115*   Lipid Profile: No results for input(s): CHOL, HDL, LDLCALC, TRIG, CHOLHDL, LDLDIRECT in the last 72 hours. Thyroid Function Tests: No results for input(s): TSH, T4TOTAL, FREET4, T3FREE, THYROIDAB in the last 72 hours. Anemia Panel: No results for input(s): VITAMINB12, FOLATE, FERRITIN, TIBC, IRON, RETICCTPCT in the last 72 hours. Sepsis Labs: No results for input(s): PROCALCITON, LATICACIDVEN in the last 168 hours.  No results found for this or any previous visit (from the past 240 hour(s)).       Radiology Studies: No results found.      Scheduled Meds: . amLODipine  10 mg Per Tube Daily  . Carbidopa-Levodopa ER  1 tablet Per Tube 5 X Daily  . chlorhexidine gluconate (MEDLINE KIT)  15 mL Mouth Rinse BID  . Chlorhexidine Gluconate Cloth  6 each Topical Daily  . docusate  50 mg Per Tube Daily  . feeding supplement (OSMOLITE 1.5 CAL)  474 mL Per Tube TID  . feeding supplement (PRO-STAT SUGAR FREE 64)  30 mL Per Tube TID  . free water  400 mL Per Tube Q8H  . insulin aspart  0-15 Units Subcutaneous TID WC  . mouth rinse  15 mL Mouth Rinse 10 times per [place]  . pantoprazole sodium  40 mg Per Tube Daily  . Pimavanserin Tartrate  34 mg Per Tube Daily  . pyridostigmine  60 mg Per Tube Daily  . rosuvastatin  10 mg Per Tube q1800   Continuous Infusions: . sodium chloride Stopped (06/10/19 0000)  . sodium chloride Stopped (06/21/19 1744)     LOS: 27 days    Time spent: 15 minutes with over 50% of the time coordinating the patient's care    Harold Hedge, DO [facility] Pager [phone]  If 7PM-7AM, please contact night-coverage [email or web address] Password [facility] 07/03/2019, 3:25 PM

## 2019-07-03 NOTE — Progress Notes (Signed)
NAME:  Derek Harrell, MRN:  [id number], DOB:  [birth year], LOS: 78 ADMISSION DATE:  06/06/2019, CONSULTATION DATE:  11/2 REFERRING MD:  Johnney Killian, CHIEF COMPLAINT:  Hypertensive crisis and fever    Brief History   73 year old male w/ h/o parkinson's dementia.  Admitted 11/2, 3 days s/p endovascular infrarenal aortic aneurysm repair on 10/29 w/ worsening weakness, MS change, HTN and fever.   Admitted to ICU, developed third degree heart block with agonal respirations with thick mucous suctioned requiring intubation.  Extubated on 11/4.  Ttransferred out to the floor 11/5 afternoon.  Then 11/6 early AM he suffered cardiac arrest.  RN alerted by telemetry to a run of VT.  He then became bradycardica and arrested. ACLS done for 12 mins prior to ROSC. He was unresponsive in the post-arrest setting requiring intubation. Transferred to ICU for TTM.  Post rewarming, patient has had no improvement in mental status with MRI consistent with hypoxic encephalopathy.   S/p tracheostomy/PEG tube placement 06/24/2019  Past Medical History  AAA, prior CVA, HTN, HL, MG, RLS, PD, OSA (did not tolerate CPAP), gait disorder   Significant Hospital Events   11/2 >>  admitted to ICU 11/3  >>Bradycardic arrest from mucus plugging.  11/4 >> extubated 11/5 >> transferred to tele/ TR[facility] 11/6 >> PEA Cardiac Arrest, ROSC after 12 minutes >> began TTM to 36 11/7 >> Re-warming 11/7 completed at noon  11/20 >> trach, PEG by surgery. Had bleeding post procedure 11/24 >> Transfer to hospitalist service  Consults:  Vascular surgery  PMT  General surgery  Procedures:  ETT 11/3 >> 11/4, 11/6 >> CVC LIJ 11/06 >> 11/11 Right Radial Aline 11/06 >> 11/9 PEG tube placement 11/20 Tracheostomy 11/20  Significant Diagnostic Tests:  CT head 11/2 >> Stable white matter disease.  MRI brain 11/9 >> Restricted diffusion in the occipital lobes bilaterally and in the frontal parietal lobes over the convexity bilaterally compatible with  acute or subacute infarction. Atrophy and extensive chronic ischemic change in the brain. Multiple areas of chronic microhemorrhage which have progressed since 2017.  Micro Data:  Blood cultures 11/2>> negative   SARS Cov2 11/2 >> negative. Blood cultures 11/6 >> negative  Urine culture 11/6 >> negative Tracheal aspirate 11/6 >> negative  Antimicrobials:  Cefepime 11/6 >> 11/10 Vancomycin 11/6 >> 11/10  Interim history/subjective:  Had apnea with PS trial yesterday.   Objective   Blood pressure 127/79, pulse 70, temperature 98.8 F (37.1 C), temperature source Oral, resp. rate 19, height 6' (1.829 m), weight 79.2 kg, SpO2 100 %.    Vent Mode: PRVC FiO2 (%):  [30 %] 30 % Set Rate:  [16 bmp] 16 bmp Vt Set:  [620 mL] 620 mL PEEP:  [5 cmH20] 5 cmH20 Plateau Pressure:  [14 cmH20-19 cmH20] 15 cmH20   Intake/Output Summary (Last 24 hours) at 07/03/2019 0808 Last data filed at 07/03/2019 0600 Gross per 24 hour  Intake 2518 ml  Output 2625 ml  Net -107 ml   Filed Weights   07/01/19 0339 07/01/19 2200 07/03/19 0304  Weight: 79.7 kg 79.5 kg 79.2 kg   Examination:  Gen:      No acute distress HEENT:  EOMI, sclera anicteric, trach in place.  Neck:     No masses; no thyromegaly trach in place Lungs:    Clear to auscultation bilaterally; normal respiratory effort CV:         Regular rate and rhythm; no murmurs Abd:      + bowel  sounds; soft, non-tender; no palpable masses, no distension Ext:    No edema; adequate peripheral perfusion Skin:      Warm and dry; no rash Neuro: Unresponsive, pupils reactive  Assessment & Plan:   Acute hypoxemic respiratory failure Obstructive sleep apnea, noncompliant with treatment at baseline Anoxic encephalopathy.   Mental status precludes liberation from mechanical ventilator Active oozing from trach and PEG site > improved Continue pressure support weans as tolerated. Intermittently he has complete apnea.  Intermittent chest x-ray Routine  trach care  Best practice:  Diet: Tube feeds at goal Pain/Anxiety/Delirium protocol (if indicated): Above antipsychotics-Parkinson's VAP protocol (if indicated): In place DVT prophylaxis: Subcu heparin-held since 19th, scd GI prophylaxis: Continue PPI Glucose control: SSI Mobility: BR Code Status: full code  Family Communication: Per primary Disposition: ICU, awaiting placement  [facility] 07/03/2019, 8:08 AM

## 2019-07-04 ENCOUNTER — Ambulatory Visit: Payer: Medicare Other | Admitting: Surgery

## 2019-07-04 DIAGNOSIS — G9340 Encephalopathy, unspecified: Secondary | ICD-10-CM | POA: Diagnosis not present

## 2019-07-04 DIAGNOSIS — I469 Cardiac arrest, cause unspecified: Secondary | ICD-10-CM | POA: Diagnosis not present

## 2019-07-04 DIAGNOSIS — J9601 Acute respiratory failure with hypoxia: Secondary | ICD-10-CM | POA: Diagnosis not present

## 2019-07-04 LAB — GLUCOSE, CAPILLARY
Glucose-Capillary: 92 mg/dL (ref 70–99)
Glucose-Capillary: 136 mg/dL — ABNORMAL HIGH (ref 70–99)
Glucose-Capillary: 101 mg/dL — ABNORMAL HIGH (ref 70–99)
Glucose-Capillary: 96 mg/dL (ref 70–99)

## 2019-07-04 LAB — CBC
HCT: 28.2 % — ABNORMAL LOW (ref 39.0–52.0)
Hemoglobin: 8.7 g/dL — ABNORMAL LOW (ref 13.0–17.0)
MCH: 26.9 pg (ref 26.0–34.0)
MCHC: 30.9 g/dL (ref 30.0–36.0)
MCV: 87.3 fL (ref 80.0–100.0)
Platelets: 160 10*3/uL (ref 150–400)
RBC: 3.23 MIL/uL — ABNORMAL LOW (ref 4.22–5.81)
RDW: 14.6 % (ref 11.5–15.5)
WBC: 8.6 10*3/uL (ref 4.0–10.5)
nRBC: 0 % (ref 0.0–0.2)

## 2019-07-04 LAB — BASIC METABOLIC PANEL
Anion gap: 12 (ref 5–15)
BUN: 54 mg/dL — ABNORMAL HIGH (ref 8–23)
CO2: 25 mmol/L (ref 22–32)
Calcium: 8.5 mg/dL — ABNORMAL LOW (ref 8.9–10.3)
Chloride: 107 mmol/L (ref 98–111)
Creatinine, Ser: 0.98 mg/dL (ref 0.61–1.24)
GFR calc Af Amer: >60 mL/min (ref >60)
GFR calc non Af Amer: >60 mL/min (ref >60)
Glucose, Bld: 115 mg/dL — ABNORMAL HIGH (ref 70–99)
Potassium: 5.1 mmol/L (ref 3.5–5.1)
Sodium: 144 mmol/L (ref 135–145)

## 2019-07-04 NOTE — Progress Notes (Signed)
PROGRESS NOTE    Derek Harrell    Code Status: Full Code  VZD:[id number] DOB: [birth year] DOA: 06/06/2019  PCP: [name], MD    Hospital Summary  73 year old male with history of Parkinson's dementia, hypertension, hyperlipidemia, OSA (did not tolerate CPAP), AAA, history of recent endovascular repair of aortic aneurysm 3 days prior to presentation on 06/02/2019, prior unspecified CVA, cellulitis, restless leg syndrome presented on 06/06/2019 with worsening weakness, mental status change, hypertension and fever.  He was admitted to ICU, developed third-degree heart block with agonal respirations with thick mucus suctioned requiring intubation.  He was extubated on 06/08/2019.  Transferred out to the floor on 06/09/2019.  On 11 6:20 AM, he suffered cardiac arrest requiring ACLS and intubation.  He was transferred back to ICU.  Post rewarming, patient has had no improvement in mental status with MRI consistent with hypoxic encephalopathy.  He is status post tracheostomy/PEG tube placement on 06/24/2019.  He is still on a ventilator via tracheostomy.  He was transferred to [facility] service on 06/28/2019.  PCCM will keep following for tracheostomy and vent.  Palliative care has evaluated the patient.  Patient remains full code.  A & P   Active Problems:   Hypertensive emergency without congestive heart failure   Pressure injury of skin   Goals of care, counseling/discussion   Hypoxia   Encephalopathy acute   Dementia without behavioral disturbance (HCC)   Cardiac arrest (HCC)   Acute respiratory failure with hypoxia (HCC)   Palliative care by specialist   Ventilator dependent ([place])  Acute hypoxic respiratory failure status post intubation and subsequent tracheostomy on ventilator Concern for aspiration event with ongoing issues with swallowing and secretion management Obstructive sleep apnea, noncompliant with treatment at baseline Status post PEG tube placement -Patient has had a long  hospitalization and could not be liberated from the ventilator because of mental status changes.  Subsequently tracheostomy and PEG tube were placed on 06/24/2019. Transferred to [facility] service on 06/28/2019.   -PCCM will keep following for vent/tracheostomy management. -Continue tube feeding as per dietitian recommendations. -Currently afebrile and hemodynamically stable. -No changes at this time   Cardiac arrest, unclear etiology Intermittent bradycardia Status post bradycardic arrest lasting for about 12 minutes on 06/10/2019.  After rewarming, patient has shown no improvement in mental status Pyridostigmine (Mestinon) can cause cardiac arrhythmia, bradycardia and cardiac arrest, respiratory arrest possibly the mechanism behind the patient's symptoms?  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 -Palliative care has had multiple discussions with the family.  Patient remains full code. - Overall prognosis is poor.    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  Hypertension  -Blood pressure stable.  Continue Norvasc  Acute on chronic anemia of chronic disease -Transfused 2 units of packed red cells on 06/26/2019.  Monitor hemoglobin.  Transfuse if hemoglobin less than 7.  Hemoglobin 8.6 today.  Hypernatremia Resolved with increased free water flushes    DVT prophylaxis: SCD Diet: Tube feeds Family Communication: Discussed with wife over the phone yesterday. Wife continues to feel patient will recover despite multiple conversations and is hoping for patient to be discharged to a facility. Disposition Plan: Poor prognosis.  Continue to plan for discharge to SNF vs. Comfort measures, pending further GOC discussion and POA.    Consultants  Palliative PCCM General surgery Cardiology Neurology Vascular surgery  Procedures    Endotracheal tube: 06/07/2019-06/08/2019; 06/10/2019 onwards  Tracheostomy and PEG tube placement on 06/24/2019 CVC left IJ: 06/10/2019-06/15/2019   Antibiotics   Anti-infectives (From admission, onward)   Start     Dose/Rate Route Frequency Ordered Stop   06/14/19 0600  vancomycin (VANCOCIN) 1,500 mg in sodium chloride 0.9 % 500 mL IVPB  Status:  Discontinued     1,500 mg 250 mL/hr over 120 Minutes Intravenous Every 24 hours 06/13/19 1207 06/13/19 1220   06/13/19 1800  vancomycin (VANCOCIN) 1,500 mg in sodium chloride 0.9 % 500 mL IVPB  Status:  Discontinued     1,500 mg 250 mL/hr over 120 Minutes Intravenous Every 24 hours 06/13/19 1220 06/14/19 0944   06/10/19 0615  vancomycin (VANCOCIN) IVPB 1000 mg/200 mL premix  Status:  Discontinued     1,000 mg 200 mL/hr over 60 Minutes Intravenous Every 12 hours 06/10/19 0605 06/13/19 1207   06/10/19 0615  ceFEPIme (MAXIPIME) 2 g in sodium chloride 0.9 % 100 mL IVPB  Status:  Discontinued     2 g 200 mL/hr over 30 Minutes Intravenous Every 8 hours 06/10/19 0605 06/14/19 0944           Subjective   Patient unarousable during exam  Objective   Vitals:   07/04/19 1143 07/04/19 1214 07/04/19 1520 07/04/19 1535  BP: (!) 120/57   130/66  Pulse: 81   75  Resp: (!) 26   17  Temp:  99.3 F (37.4 C) 98.9 F (37.2 C)   TempSrc:  Oral Oral   SpO2: 100%   100%  Weight:      Height:        Intake/Output Summary (Last 24 hours) at 07/04/2019 1635 Last data filed at 07/04/2019 0950 Gross per 24 hour  Intake 925 ml  Output 1700 ml  Net -775 ml   Filed Weights   07/01/19 2200 07/03/19 0304 07/04/19 0337  Weight: 79.5 kg 79.2 kg 80 kg    Examination:  Physical Exam Vitals signs and nursing note reviewed.  Constitutional:      Appearance: He is ill-appearing.     Comments: Not arousable  HENT:     Mouth/Throat:     Mouth: Mucous membranes are dry.  Cardiovascular:     Rate and Rhythm: Normal rate and regular rhythm.   Pulmonary:     Effort: No respiratory distress.     Breath sounds: No wheezing.     Comments: Trach Abdominal:     General: Abdomen is flat.     Palpations: Abdomen is soft.  Musculoskeletal:     Right shoulder: He exhibits normal range of motion.     Right lower leg: No edema.     Left lower leg: No edema.  Neurological:     Mental Status: He is alert.     Comments: Encephalopathic Does not withdraw with painful stimuli      Data Reviewed: I have personally reviewed following labs and imaging studies  CBC: Recent Labs  Lab 06/30/19 0048 07/01/19 0430 07/02/19 0256 07/03/19 0312 07/04/19 0343  WBC 7.6 9.4 10.2 9.1 8.6  NEUTROABS  --  6.8  --   --   --   HGB 7.9* 8.6* 8.3* 8.4* 8.7*  HCT 25.2* 27.5* 26.8* 26.7* 28.2*  MCV 86.9 87.9 88.2 87.8 87.3  PLT 171 185 189 190 585   Basic Metabolic Panel: Recent Labs  Lab 06/28/19 0200 06/29/19 0251 06/30/19 0048 07/01/19 0430 07/02/19 0256 07/03/19 0312 07/04/19 0343  NA 146* 146* 148* 151* 148* 147* 144  K 3.8 3.8 3.7 4.5 4.7 5.1 5.1  CL 112* 111 112* 111 109 108 107  CO2 '27 27 27 29 27 25 25  '$ GLUCOSE 123* 127* 175* 122* 120* 120* 115*  BUN 50* 51* 48* 52* 54* 54* 54*  CREATININE 0.90 0.88 0.92 0.90 0.96 1.01 0.98  CALCIUM 8.4* 8.6* 8.6* 8.9 8.5* 8.6* 8.5*  MG 2.2 2.3 2.3 2.4  --   --   --    GFR: Estimated Creatinine Clearance: 75.9 mL/min (by C-G formula based on SCr of 0.98 mg/dL). Liver Function Tests: Recent Labs  Lab 06/28/19 0200 06/29/19 0251 06/30/19 0048  AST 29 33 32  ALT '9 9 10  '$ ALKPHOS 78 85 86  BILITOT 1.0 1.1 0.8  PROT 5.6* 5.9* 5.7*  ALBUMIN 2.2* 2.2* 2.2*   No results for input(s): LIPASE, AMYLASE in the last 168 hours. No results for input(s): AMMONIA in the last 168 hours. Coagulation Profile: No results for input(s): INR, PROTIME in the last 168 hours. Cardiac Enzymes: No results for input(s): CKTOTAL, CKMB, CKMBINDEX, TROPONINI in the last 168 hours. BNP (last 3 results) No  results for input(s): PROBNP in the last 8760 hours. HbA1C: No results for input(s): HGBA1C in the last 72 hours. CBG: Recent Labs  Lab 07/03/19 1629 07/03/19 2131 07/04/19 0741 07/04/19 1154 07/04/19 1510  GLUCAP 130* 151* 92 136* 101*   Lipid Profile: No results for input(s): CHOL, HDL, LDLCALC, TRIG, CHOLHDL, LDLDIRECT in the last 72 hours. Thyroid Function Tests: No results for input(s): TSH, T4TOTAL, FREET4, T3FREE, THYROIDAB in the last 72 hours. Anemia Panel: No results for input(s): VITAMINB12, FOLATE, FERRITIN, TIBC, IRON, RETICCTPCT in the last 72 hours. Sepsis Labs: No results for input(s): PROCALCITON, LATICACIDVEN in the last 168 hours.  No results found for this or any previous visit (from the past 240 hour(s)).       Radiology Studies: No results found.      Scheduled Meds:  amLODipine  10 mg Per Tube Daily   Carbidopa-Levodopa ER  1 tablet Per Tube 5 X Daily   chlorhexidine gluconate (MEDLINE KIT)  15 mL Mouth Rinse BID   Chlorhexidine Gluconate Cloth  6 each Topical Daily   docusate  50 mg Per Tube Daily   feeding supplement (OSMOLITE 1.5 CAL)  474 mL Per Tube TID   feeding supplement (PRO-STAT SUGAR FREE 64)  30 mL Per Tube TID   free water  400 mL Per Tube Q8H   insulin aspart  0-15 Units Subcutaneous TID WC   mouth rinse  15 mL Mouth Rinse 10 times per [place]   pantoprazole sodium  40 mg Per Tube Daily   Pimavanserin Tartrate  34 mg Per Tube Daily   pyridostigmine  60 mg Per Tube Daily   rosuvastatin  10 mg Per Tube q1800   Continuous Infusions:  sodium chloride Stopped (06/10/19 0000)   sodium chloride Stopped (06/21/19 1744)     LOS: 28 days    Time spent: 16 minutes with over 50% of the time coordinating the patient's care    Harold Hedge, DO [facility] Pager [phone]  If 7PM-7AM, please contact night-coverage [email or web address] Password [facility] 07/04/2019, 4:35 PM

## 2019-07-05 DIAGNOSIS — I469 Cardiac arrest, cause unspecified: Secondary | ICD-10-CM | POA: Diagnosis not present

## 2019-07-05 DIAGNOSIS — R0902 Hypoxemia: Secondary | ICD-10-CM | POA: Diagnosis not present

## 2019-07-05 DIAGNOSIS — G9340 Encephalopathy, unspecified: Secondary | ICD-10-CM | POA: Diagnosis not present

## 2019-07-05 LAB — GLUCOSE, CAPILLARY
Glucose-Capillary: 101 mg/dL — ABNORMAL HIGH (ref 70–99)
Glucose-Capillary: 159 mg/dL — ABNORMAL HIGH (ref 70–99)
Glucose-Capillary: 143 mg/dL — ABNORMAL HIGH (ref 70–99)
Glucose-Capillary: 123 mg/dL — ABNORMAL HIGH (ref 70–99)
Glucose-Capillary: 143 mg/dL — ABNORMAL HIGH (ref 70–99)

## 2019-07-05 MED ORDER — CHLORHEXIDINE GLUCONATE 0.12 % MT SOLN
OROMUCOSAL | Status: AC
  Filled 2019-07-05: qty 15

## 2019-07-05 NOTE — Progress Notes (Signed)
PROGRESS NOTE    Derek Harrell    Code Status: Full Code  HQP:[id number] DOB: February 03, 1946 DOA: 06/06/2019  PCP: [name], MD    Hospital Summary  72 year old male with history of Parkinson's dementia, hypertension, hyperlipidemia, OSA (did not tolerate CPAP), AAA, history of recent endovascular repair of aortic aneurysm 3 days prior to presentation on 06/02/2019, prior unspecified CVA, cellulitis, restless leg syndrome presented on 06/06/2019 with worsening weakness, mental status change, hypertension and fever.  He was admitted to ICU, developed third-degree heart block with agonal respirations with thick mucus suctioned requiring intubation.  He was extubated on 06/08/2019.  Transferred out to the floor on 06/09/2019.  On 11 6:20 AM, he suffered cardiac arrest requiring ACLS and intubation.  He was transferred back to ICU.  Post rewarming, patient has had no improvement in mental status with MRI consistent with hypoxic encephalopathy.  He is status post tracheostomy/PEG tube placement on 06/24/2019.  He is still on a ventilator via tracheostomy.  He was transferred to [facility] service on 06/28/2019.  PCCM will keep following for tracheostomy and vent.  Palliative care has evaluated the patient.  Patient remains full code.  A & P   Active Problems:   Hypertensive emergency without congestive heart failure   Pressure injury of skin   Goals of care, counseling/discussion   Hypoxia   Encephalopathy acute   Dementia without behavioral disturbance (HCC)   Cardiac arrest (HCC)   Acute respiratory failure with hypoxia (HCC)   Palliative care by specialist   Ventilator dependent ([place])  Acute hypoxic respiratory failure status post intubation and subsequent tracheostomy on ventilator Concern for aspiration event with ongoing issues with swallowing and secretion management Obstructive sleep apnea, noncompliant with treatment at baseline Status post PEG tube placement -Patient has had a long  hospitalization and could not be liberated from the ventilator because of mental status changes.  Subsequently tracheostomy and PEG tube were placed on 06/24/2019. Transferred to [facility] service on 06/28/2019.   -PCCM will keep following for vent/tracheostomy management. -SLP signing off today -Continue current management pending further goals of care discussion   Cardiac arrest, unclear etiology Intermittent bradycardia Status post bradycardic arrest lasting for about 12 minutes on 06/10/2019.  After rewarming, patient has shown no improvement in mental status Pyridostigmine (Mestinon) can cause cardiac arrhythmia, bradycardia and cardiac arrest, respiratory arrest possibly the mechanism behind the patient's symptoms?  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 -Palliative care has had multiple discussions with the family.  Patient remains full code. - Overall prognosis is poor.    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  Hypertension  -Blood pressure stable.  Continue Norvasc  Acute on chronic anemia of chronic disease -Transfused 2 units of packed red cells on 06/26/2019.  Monitor hemoglobin.  Transfuse if hemoglobin less than 7.  Hemoglobin 8.6 today.  Hypernatremia Resolved with increased free water flushes    DVT prophylaxis: SCD Diet: Tube feeds Family Communication: Wife coming in today.  Will discuss at bedside this evening Disposition Plan: Poor prognosis.  After discussion also for today, patient's wife has applied for insurance approval which can take up to 90 days.  Patient's wife who is POA has been refusing hospice care or comfort measures and is requesting SNF discharge as noted previously.  We will continue to discuss goals of care  Consultants  Palliative PCCM General surgery Cardiology Neurology Vascular  surgery  Procedures  Endotracheal tube: 06/07/2019-06/08/2019; 06/10/2019 onwards Tracheostomy and PEG tube placement on 06/24/2019 CVC left IJ: 06/10/2019-06/15/2019   Antibiotics   Anti-infectives (From admission, onward)   Start     Dose/Rate Route Frequency Ordered Stop   06/14/19 0600  vancomycin (VANCOCIN) 1,500 mg in sodium chloride 0.9 % 500 mL IVPB  Status:  Discontinued     1,500 mg 250 mL/hr over 120 Minutes Intravenous Every 24 hours 06/13/19 1207 06/13/19 1220   06/13/19 1800  vancomycin (VANCOCIN) 1,500 mg in sodium chloride 0.9 % 500 mL IVPB  Status:  Discontinued     1,500 mg 250 mL/hr over 120 Minutes Intravenous Every 24 hours 06/13/19 1220 06/14/19 0944   06/10/19 0615  vancomycin (VANCOCIN) IVPB 1000 mg/200 mL premix  Status:  Discontinued     1,000 mg 200 mL/hr over 60 Minutes Intravenous Every 12 hours 06/10/19 0605 06/13/19 1207   06/10/19 0615  ceFEPIme (MAXIPIME) 2 g in sodium chloride 0.9 % 100 mL IVPB  Status:  Discontinued     2 g 200 mL/hr over 30 Minutes Intravenous Every 8 hours 06/10/19 0605 06/14/19 0944           Subjective   Continues to be unarousable during exam  Objective   Vitals:   07/05/19 1400 07/05/19 1500 07/05/19 1524 07/05/19 1547  BP: 111/77 126/73 126/73   Pulse: 79 78    Resp:   (!) 24   Temp:    99.3 F (37.4 C)  TempSrc:    Oral  SpO2: 100% 100% 100%   Weight:      Height:        Intake/Output Summary (Last 24 hours) at 07/05/2019 1611 Last data filed at 07/05/2019 1550 Gross per 24 hour  Intake 1278 ml  Output 2750 ml  Net -1472 ml   Filed Weights   07/03/19 0304 07/04/19 0337 07/05/19 0400  Weight: 79.2 kg 80 kg 81.2 kg    Examination:  Physical Exam Vitals signs and nursing note reviewed.  Constitutional:      Comments: Unresponsive on trach/vent  HENT:     Mouth/Throat:     Mouth: Mucous membranes are moist.  Cardiovascular:     Rate and Rhythm: Normal rate and regular rhythm.     Heart sounds:  Murmur present.  Pulmonary:     Effort: Pulmonary effort is normal.     Breath sounds: Normal breath sounds.     Comments: Vent dependent Musculoskeletal:        General: No swelling.     Comments: SCDs  Neurological:     Comments: At new baseline Does not respond to painful stimuli     Data Reviewed: I have personally reviewed following labs and imaging studies  CBC: Recent Labs  Lab 06/30/19 0048 07/01/19 0430 07/02/19 0256 07/03/19 0312 07/04/19 0343  WBC 7.6 9.4 10.2 9.1 8.6  NEUTROABS  --  6.8  --   --   --   HGB 7.9* 8.6* 8.3* 8.4* 8.7*  HCT 25.2* 27.5* 26.8* 26.7* 28.2*  MCV 86.9 87.9 88.2 87.8 87.3  PLT 171 185 189 190 967   Basic Metabolic Panel: Recent Labs  Lab 06/29/19 0251 06/30/19 0048 07/01/19 0430 07/02/19 0256 07/03/19 0312 07/04/19 0343  NA 146* 148* 151* 148* 147* 144  K 3.8 3.7 4.5 4.7 5.1 5.1  CL 111 112* 111 109 108 107  CO2 '27 27 29 27 25 25  '$ GLUCOSE 127* 175* 122* 120* 120* 115*  BUN 51* 48* 52* 54* 54* 54*  CREATININE 0.88 0.92 0.90 0.96 1.01 0.98  CALCIUM 8.6* 8.6* 8.9 8.5* 8.6* 8.5*  MG 2.3 2.3 2.4  --   --   --    GFR: Estimated Creatinine Clearance: 75.9 mL/min (by C-G formula based on SCr of 0.98 mg/dL). Liver Function Tests: Recent Labs  Lab 06/29/19 0251 06/30/19 0048  AST 33 32  ALT 9 10  ALKPHOS 85 86  BILITOT 1.1 0.8  PROT 5.9* 5.7*  ALBUMIN 2.2* 2.2*   No results for input(s): LIPASE, AMYLASE in the last 168 hours. No results for input(s): AMMONIA in the last 168 hours. Coagulation Profile: No results for input(s): INR, PROTIME in the last 168 hours. Cardiac Enzymes: No results for input(s): CKTOTAL, CKMB, CKMBINDEX, TROPONINI in the last 168 hours. BNP (last 3 results) No results for input(s): PROBNP in the last 8760 hours. HbA1C: No results for input(s): HGBA1C in the last 72 hours. CBG: Recent Labs  Lab 07/04/19 1510 07/04/19 2148 07/05/19 0729 07/05/19 1100 07/05/19 1507  GLUCAP 101* 96 101* 159*  143*   Lipid Profile: No results for input(s): CHOL, HDL, LDLCALC, TRIG, CHOLHDL, LDLDIRECT in the last 72 hours. Thyroid Function Tests: No results for input(s): TSH, T4TOTAL, FREET4, T3FREE, THYROIDAB in the last 72 hours. Anemia Panel: No results for input(s): VITAMINB12, FOLATE, FERRITIN, TIBC, IRON, RETICCTPCT in the last 72 hours. Sepsis Labs: No results for input(s): PROCALCITON, LATICACIDVEN in the last 168 hours.  No results found for this or any previous visit (from the past 240 hour(s)).       Radiology Studies: No results found.      Scheduled Meds: . amLODipine  10 mg Per Tube Daily  . Carbidopa-Levodopa ER  1 tablet Per Tube 5 X Daily  . chlorhexidine gluconate (MEDLINE KIT)  15 mL Mouth Rinse BID  . Chlorhexidine Gluconate Cloth  6 each Topical Daily  . docusate  50 mg Per Tube Daily  . feeding supplement (OSMOLITE 1.5 CAL)  474 mL Per Tube TID  . feeding supplement (PRO-STAT SUGAR FREE 64)  30 mL Per Tube TID  . free water  400 mL Per Tube Q8H  . insulin aspart  0-15 Units Subcutaneous TID WC  . mouth rinse  15 mL Mouth Rinse 10 times per [place]  . pantoprazole sodium  40 mg Per Tube Daily  . Pimavanserin Tartrate  34 mg Per Tube Daily  . pyridostigmine  60 mg Per Tube Daily  . rosuvastatin  10 mg Per Tube q1800   Continuous Infusions: . sodium chloride Stopped (06/10/19 0000)  . sodium chloride Stopped (06/21/19 1744)     LOS: 29 days    Time spent: 20 minutes with over 50% of the time coordinating the patient's care    Harold Hedge, DO [facility] Pager [phone]  If 7PM-7AM, please contact night-coverage [email or web address] Password [email or web address] 07/05/2019, 4:11 PM

## 2019-07-05 NOTE — TOC Progression Note (Signed)
Transition of Care [facility]) - Progression Note    Patient Details  Name: Derek Harrell MRN: [id number] Date of Birth: 1946-01-15  Transition of Care [facility]) CM/SW Contact  Bartholomew Crews, RN Phone Number: [phone] 07/05/2019, 3:59 PM  Clinical Narrative:    Damaris Schooner with MD about transition options for patient. Discussed needing vent snf but does not have long term medicaid. Spouse has started Medicaid process which takes on average 90 days. Discussed home hospice with [facility], but family would have to be in agreement with one way extubation at 4 weeks. The family could agree to care for patient at home, however, 24/7 care would be difficult to provide by the family. MD to continue conversations with spouse. TOC following for transition needs.    Expected Discharge Plan: Long Term Nursing Home Barriers to Discharge: Continued Medical Work up  Expected Discharge Plan and Services Expected Discharge Plan: [place] In-house Referral: Clinical Social Work Discharge Planning Services: CM Consult   Living arrangements for the past 2 months: [place] Determinants of Health (SDOH) Interventions    Readmission Risk Interventions Readmission Risk Prevention Plan 06/03/2019  Transportation Screening Complete  PCP or Specialist Appt within 3-5 Days Complete  HRI or [place] Complete  Social Work Consult for [place] Planning/Counseling Complete  Palliative Care Screening Not Applicable  Medication Review [employer] photographer) Complete  Some recent data might be hidden

## 2019-07-05 NOTE — Progress Notes (Signed)
SLP Discharge Note  Patient Details Name: Derek Harrell MRN: [id number] DOB: [birth year]   Pt remains on ventilator with no change in MS.  Our service will sign off.   Derek Harrell L. Derek Harrell, Derek Harrell Office number [phone] Pager [phone]            Derek Harrell Derek Harrell 07/05/2019, 8:43 AM

## 2019-07-06 DIAGNOSIS — Z9911 Dependence on respirator [ventilator] status: Secondary | ICD-10-CM | POA: Diagnosis not present

## 2019-07-06 DIAGNOSIS — J9601 Acute respiratory failure with hypoxia: Secondary | ICD-10-CM | POA: Diagnosis not present

## 2019-07-06 DIAGNOSIS — I469 Cardiac arrest, cause unspecified: Secondary | ICD-10-CM | POA: Diagnosis not present

## 2019-07-06 LAB — GLUCOSE, CAPILLARY
Glucose-Capillary: 100 mg/dL — ABNORMAL HIGH (ref 70–99)
Glucose-Capillary: 129 mg/dL — ABNORMAL HIGH (ref 70–99)
Glucose-Capillary: 118 mg/dL — ABNORMAL HIGH (ref 70–99)
Glucose-Capillary: 139 mg/dL — ABNORMAL HIGH (ref 70–99)

## 2019-07-06 MED ORDER — IPRATROPIUM-ALBUTEROL 0.5-2.5 (3) MG/3ML IN SOLN: 3.0000 mL | Freq: Four times a day (QID) | RESPIRATORY_TRACT | Status: DC | PRN

## 2019-07-06 NOTE — Progress Notes (Signed)
NAME:  Derek Harrell, MRN:  [id number], DOB:  1946/05/21, LOS: 82 ADMISSION DATE:  06/06/2019, CONSULTATION DATE:  11/2 REFERRING MD:  Johnney Killian, CHIEF COMPLAINT:  Hypertensive crisis and fever    Brief History   73 year old male w/ h/o parkinson's dementia.  Admitted 11/2, 3 days s/p endovascular infrarenal aortic aneurysm repair on 10/29 w/ worsening weakness, MS change, HTN and fever.   Admitted to ICU, developed third degree heart block with agonal respirations with thick mucous suctioned requiring intubation.  Extubated on 11/4.  Ttransferred out to the floor 11/5 afternoon.  Then 11/6 early AM he suffered cardiac arrest.  RN alerted by telemetry to a run of VT.  He then became bradycardica and arrested. ACLS done for 12 mins prior to ROSC. He was unresponsive in the post-arrest setting requiring intubation. Transferred to ICU for TTM.  Post rewarming, patient has had no improvement in mental status with MRI consistent with hypoxic encephalopathy.   S/p tracheostomy/PEG tube placement 06/24/2019  Past Medical History  AAA, prior CVA, HTN, HL, MG, RLS, PD, OSA (did not tolerate CPAP), gait disorder   Significant Hospital Events   11/2 >>  admitted to ICU 11/3  >>Bradycardic arrest from mucus plugging.  11/4 >> extubated 11/5 >> transferred to tele/ TRH 11/6 >> PEA Cardiac Arrest, ROSC after 12 minutes >> began TTM to 36 11/7 >> Re-warming 11/7 completed at noon  11/20 >> trach, PEG by surgery. Had bleeding post procedure 11/24 >> Transfer to hospitalist service  Consults:  Vascular surgery  PMT  General surgery  Procedures:  ETT 11/3 >> 11/4, 11/6 >> CVC LIJ 11/06 >> 11/11 Right Radial Aline 11/06 >> 11/9 PEG tube placement 11/20 Tracheostomy 11/20  Significant Diagnostic Tests:  CT head 11/2 >> Stable white matter disease.  MRI brain 11/9 >> Restricted diffusion in the occipital lobes bilaterally and in the frontal parietal lobes over the convexity bilaterally compatible with  acute or subacute infarction. Atrophy and extensive chronic ischemic change in the brain. Multiple areas of chronic microhemorrhage which have progressed since 2017.  Micro Data:  Blood cultures 11/2>> negative   SARS Cov2 11/2 >> negative. Blood cultures 11/6 >> negative  Urine culture 11/6 >> negative Tracheal aspirate 11/6 >> negative  Antimicrobials:  Cefepime 11/6 >> 11/10 Vancomycin 11/6 >> 11/10  Interim history/subjective:  Last [facility] by [facility] 11/29, Family requesting SNF discharge.  Medicaid paper work started and may take up to 90 days.  Tolerated PSV from 12p-8p yesterday. Placed on rate overnight.   Objective   Blood pressure (!) 94/54, pulse (!) 41, temperature (!) 100.4 F (38 C), temperature source Axillary, resp. rate 16, height 6' (1.829 m), weight 78.7 kg, SpO2 98 %.    Vent Mode: PRVC FiO2 (%):  [30 %] 30 % Set Rate:  [16 bmp] 16 bmp Vt Set:  [620 mL] 620 mL PEEP:  [5 cmH20] 5 cmH20 Pressure Support:  [5 cmH20] 5 cmH20 Plateau Pressure:  [14 cmH20-16 cmH20] 16 cmH20   Intake/Output Summary (Last 24 hours) at 07/06/2019 0800 Last data filed at 07/06/2019 0600 Gross per 24 hour  Intake 1349 ml  Output 2410 ml  Net -1061 ml   Filed Weights   07/04/19 0337 07/05/19 0400 07/06/19 0403  Weight: 80 kg 81.2 kg 78.7 kg   Examination:  Gen:     Intubated, minimally responsive on ventilator HEENT: [place]/AT. PERRL, trach midline Lungs:   CTA, symmetric expansion with normal respiratory effort. Minimal secretions via trach  inline suction CV:  SR on telemetry, no murmurs. Warm and well perfused, no edema.  Abd:  + bowel sounds, soft, non tender, non distended, no organomegaly Ext: No deformities Skin:  Warm and dry, no rash or wounds Neuro: PERRL, + cough/gag. Does not follow commands  Assessment & Plan:   Acute hypoxemic respiratory failure s/p trach Obstructive sleep apnea, noncompliant with treatment at baseline Anoxic encephalopathy.   -Continue daily trach  care. -Trach placed 11/20. D/c trach sutures today -Tolerating PSV 5/5, no apnea noted on ventilator trend review. T Collar trials to start this afternoon if no episodes of apea.  Will need vent support at night.   Best practice:  Diet: Tube feeds at goal Pain/Anxiety/Delirium protocol (if indicated): Home meds VAP protocol (if indicated): In place DVT prophylaxis: Subcu heparin-held since 19th, scd per primary GI prophylaxis: Continue PPI Glucose control: SSI Mobility: BR Code Status: full code  Family Communication: Per primary Disposition: ICU, awaiting placement   Paulita Fujita, ACNP [facility]  Pager: [phone] After hours pager: [phone]

## 2019-07-06 NOTE — Progress Notes (Signed)
RT note: upon arrival to patient room to perform 1200 assessment, RT noted that patient had increased use of accessory muscles.  Placed patient back on full support ventilator settings and work of breathing began to ease.  Tolerating current ventilator settings well.  Will continue to monitor.

## 2019-07-06 NOTE — Progress Notes (Signed)
Called by Dr. Rory Percy regarding a call he received to evaluate patient for brain death. Patient was reassessed during this time and noted to have no changes from prior exams. PERRL 80mm in size. Corneal, cough, and gag reflexes intact. Patient breathing over vent. Trace movement noted in upper extremities when sternally rubbed but no purposeful movement noted. These findings were relayed to Dr. Rory Percy. No further orders received.  Myrle Sheng, BSN, RN 15M/81M MICU

## 2019-07-06 NOTE — Progress Notes (Signed)
 Nutrition Follow-up  DOCUMENTATION CODES:   Not applicable  INTERVENTION:   Tube Feeding:  Continue bolus feedings of Osmolite 1.5 474 mL (2 cans) TID Pro-Stat 30 mL TID Provides 2430 kcals, 134 g of protein and 1086 mL of free water   NUTRITION DIAGNOSIS:   Inadequate oral intake related to inability to eat as evidenced by NPO status.  Being addressed via TF   GOAL:   Patient will meet greater than or equal to 90% of their needs  Progressing  MONITOR:   Vent status, Labs, Weight trends, TF tolerance, Skin, I & O's, Other (Comment)(GOC)  REASON FOR ASSESSMENT:   Ventilator, Consult Enteral/tube feeding initiation and management  ASSESSMENT:   73 yo male admitted with worsening weakness, MS change, HTN, fever. PMH includes HTN, AAA, HLD, CVD, obesity, stroke, myasthenia gravis, RLS, Parkinson's disease, dementia.   11/02 - admit 11/03 - Cortrak placed, tip gastric per Cortrak team 11/04 - extubated 11/06 - cardiac arrest, reintubated, TTM 36 11/20 - Trach and PEG placed  Pt remains on vent support via trach, trach collar as tolerated  Tolerating bolus feedings via G-tube  No new pressures injuries noted  Weight relatively stable  Labs: reviewed Meds: reviewed   Diet Order:   Diet Order            Diet NPO time specified  Diet effective now              EDUCATION NEEDS:   No education needs have been identified at this time  Skin:  Skin Assessment: Skin Integrity Issues: Skin Integrity Issues:: Stage II, Incisions Stage II: buttocks Incisions: groin  Last BM:  12/1  Height:   Ht Readings from Last 1 Encounters:  06/06/19 6' (1.829 m)    Weight:   Wt Readings from Last 1 Encounters:  07/06/19 78.7 kg    Ideal Body Weight:  80.9 kg  BMI:  Body mass index is 23.53 kg/m.  Estimated Nutritional Needs:   Kcal:  2200-2400 kcals  Protein:  110-130 g  Fluid:  >/= 2 L    Traylen Eckels MS, RDN, LDN, CNSC [phone] Pager   [phone] Weekend/On-Call Pager

## 2019-07-06 NOTE — Progress Notes (Signed)
RT note: patient placed on CPAP/PSV of 5/5 at 0800.  Currently tolerating well.  Will continue to monitor.  

## 2019-07-06 NOTE — Progress Notes (Signed)
Patient's trach sutures removed per MD order.  Patient noted to have sores under where the trach flanges sit.  RN made aware.  RN and RT placed foam dressing over sores.  Trach care completed.  Tolerating ventilator settings well.  Will continue to monitor.

## 2019-07-06 NOTE — Progress Notes (Addendum)
PROGRESS NOTE    Derek Harrell    Code Status: Full Code  ZOX:[id number] DOB: 07/19/1946 DOA: 06/06/2019  PCP: [name], MD    Hospital Summary  73 year old male with history of Parkinson's dementia, hypertension, hyperlipidemia, OSA (did not tolerate CPAP), AAA, history of recent endovascular repair of aortic aneurysm 3 days prior to presentation on 06/02/2019, prior unspecified CVA, cellulitis, restless leg syndrome presented on 06/06/2019 with worsening weakness, mental status change, hypertension and fever.  He was admitted to ICU, developed third-degree heart block with agonal respirations with thick mucus suctioned requiring intubation.  He was extubated on 06/08/2019.  Transferred out to the floor on 06/09/2019.  On 11 6:20 AM, he suffered cardiac arrest requiring ACLS and intubation.  He was transferred back to ICU.  Post rewarming, patient has had no improvement in mental status with MRI consistent with hypoxic encephalopathy.  He is status post tracheostomy/PEG tube placement on 06/24/2019.  He is still on a ventilator via tracheostomy.  He was transferred to [facility] service on 06/28/2019.  PCCM will keep following for tracheostomy and vent.  Palliative care has evaluated the patient.  Patient remains full code.  A & P   Active Problems:   Hypertensive emergency without congestive heart failure   Pressure injury of skin   Goals of care, counseling/discussion   Hypoxia   Encephalopathy acute   Dementia without behavioral disturbance (HCC)   Cardiac arrest ([place])   Acute respiratory failure with hypoxia ([place])   Palliative care by specialist   Ventilator dependent ([place])  Acute hypoxic respiratory failure status post intubation and subsequent tracheostomy on ventilator Concern for aspiration event with ongoing issues with swallowing and secretion management Obstructive sleep apnea, noncompliant with treatment at baseline Status post PEG tube placement Tracheostomy and PEG tube were  placed on 06/24/2019.  Transferred to [facility] service on 06/28/2019.   PCCM following for vent/tracheostomy management. Not tolerating vent wean Does not withdraw to painful stimuli -Continue current management as per wife.      Cardiac arrest, possibly medication (pyridostigmine) induced Intermittent bradycardia Status post bradycardic arrest lasting for about 12 minutes on 06/10/2019.  After rewarming, patient has shown no improvement in mental status Pyridostigmine (Mestinon) can cause cardiac arrhythmia, bradycardia and cardiac arrest, respiratory arrest possibly the mechanism behind the patient's symptoms?  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 Palliative care has had multiple discussions with the family.  Patient remains full code.   EEG 11/7: suggestive of profound diffuse encephalopathy, nonspecific to etiology.No seizures or epileptiform discharges were seen throughout the recording  -Will ask neurology to reevaluate patient to assess for brain death -Consider ethics consult - Overall prognosis is poor.    Addendum: discussed with neuro, patient has brainstem reflexes intact and therefore is not brain dead. Ok to discontinue pyridostigmine.   Parkinson's disease with dementia with progressive debilitation Myasthenic gravis - will discontinue pyridostigmine due to above.  Hypertension  -Blood pressure stable.  Continue Norvasc  Acute on chronic anemia of chronic disease -Transfused 2 units of packed red cells on 06/26/2019.  Monitor hemoglobin.  Transfuse if hemoglobin less than 7.  Hemoglobin 8.6 today.  Hypernatremia Resolved with increased free water flushes    DVT prophylaxis: SCD Diet: Tube feeds Family Communication: [place] discussion with wife yesterday again. She continues to want to pursue current level of care Disposition Plan: Poor prognosis. Awaiting insurance approval and SNF placement   Consultants  Palliative PCCM General surgery  Cardiology Neurology Vascular surgery  Procedures  Endotracheal tube: 06/07/2019-06/08/2019; 06/10/2019 onwards Tracheostomy and PEG tube placement on 06/24/2019 CVC left IJ: 06/10/2019-06/15/2019   Antibiotics   Anti-infectives (From admission, onward)   Start     Dose/Rate Route Frequency Ordered Stop   06/14/19 0600  vancomycin (VANCOCIN) 1,500 mg in sodium chloride 0.9 % 500 mL IVPB  Status:  Discontinued     1,500 mg 250 mL/hr over 120 Minutes Intravenous Every 24 hours 06/13/19 1207 06/13/19 1220   06/13/19 1800  vancomycin (VANCOCIN) 1,500 mg in sodium chloride 0.9 % 500 mL IVPB  Status:  Discontinued     1,500 mg 250 mL/hr over 120 Minutes Intravenous Every 24 hours 06/13/19 1220 06/14/19 0944   06/10/19 0615  vancomycin (VANCOCIN) IVPB 1000 mg/200 mL premix  Status:  Discontinued     1,000 mg 200 mL/hr over 60 Minutes Intravenous Every 12 hours 06/10/19 0605 06/13/19 1207   06/10/19 0615  ceFEPIme (MAXIPIME) 2 g in sodium chloride 0.9 % 100 mL IVPB  Status:  Discontinued     2 g 200 mL/hr over 30 Minutes Intravenous Every 8 hours 06/10/19 0605 06/14/19 0944           Subjective   nonresponsive on exam  Objective   Vitals:   07/06/19 1000 07/06/19 1100 07/06/19 1104 07/06/19 1148  BP: 126/71 (!) 139/53 (!) 139/53   Pulse: 79 (!) 40 88   Resp: 20 (!) 23 (!) 23   Temp:    99.6 F (37.6 C)  TempSrc:    Oral  SpO2: 99% 99% 99%   Weight:      Height:        Intake/Output Summary (Last 24 hours) at 07/06/2019 1343 Last data filed at 07/06/2019 0900 Gross per 24 hour  Intake 1275 ml  Output 2410 ml  Net -1135 ml   Filed Weights   07/04/19 0337 07/05/19 0400 07/06/19 0403  Weight: 80 kg 81.2 kg 78.7 kg    Examination:  Physical Exam Constitutional:      Comments: unresponsive  Eyes:     Extraocular Movements:     Right eye: Abnormal extraocular motion present.     Left eye: Abnormal extraocular motion  present.     Pupils:     Right eye: Pupil is not reactive.     Left eye: Pupil is not reactive.  Neck:     Comments: trach Cardiovascular:     Rate and Rhythm: Normal rate.  Pulmonary:     Breath sounds: No wheezing.     Comments: Vent dependent Abdominal:     General: Abdomen is flat. There is no distension.  Neurological:     Comments: Does not withdraw to pain     Data Reviewed: I have personally reviewed following labs and imaging studies  CBC: Recent Labs  Lab 06/30/19 0048 07/01/19 0430 07/02/19 0256 07/03/19 0312 07/04/19 0343  WBC 7.6 9.4 10.2 9.1 8.6  NEUTROABS  --  6.8  --   --   --   HGB 7.9* 8.6* 8.3* 8.4* 8.7*  HCT 25.2* 27.5* 26.8* 26.7* 28.2*  MCV 86.9 87.9 88.2 87.8 87.3  PLT 171 185 189 190 599   Basic Metabolic Panel: Recent Labs  Lab 06/30/19 0048 07/01/19 0430 07/02/19 0256 07/03/19 0312 07/04/19 0343  NA 148* 151* 148* 147* 144  K 3.7 4.5 4.7 5.1 5.1  CL 112* 111 109 108 107  CO2 '27 29 27 25 25  '$ GLUCOSE 175* 122*  120* 120* 115*  BUN 48* 52* 54* 54* 54*  CREATININE 0.92 0.90 0.96 1.01 0.98  CALCIUM 8.6* 8.9 8.5* 8.6* 8.5*  MG 2.3 2.4  --   --   --    GFR: Estimated Creatinine Clearance: 75.9 mL/min (by C-G formula based on SCr of 0.98 mg/dL). Liver Function Tests: Recent Labs  Lab 06/30/19 0048  AST 32  ALT 10  ALKPHOS 86  BILITOT 0.8  PROT 5.7*  ALBUMIN 2.2*   No results for input(s): LIPASE, AMYLASE in the last 168 hours. No results for input(s): AMMONIA in the last 168 hours. Coagulation Profile: No results for input(s): INR, PROTIME in the last 168 hours. Cardiac Enzymes: No results for input(s): CKTOTAL, CKMB, CKMBINDEX, TROPONINI in the last 168 hours. BNP (last 3 results) No results for input(s): PROBNP in the last 8760 hours. HbA1C: No results for input(s): HGBA1C in the last 72 hours. CBG: Recent Labs  Lab 07/05/19 1507 07/05/19 1742 07/05/19 2124 07/06/19 0753 07/06/19 1140  GLUCAP 143* 123* 143* 100*  129*   Lipid Profile: No results for input(s): CHOL, HDL, LDLCALC, TRIG, CHOLHDL, LDLDIRECT in the last 72 hours. Thyroid Function Tests: No results for input(s): TSH, T4TOTAL, FREET4, T3FREE, THYROIDAB in the last 72 hours. Anemia Panel: No results for input(s): VITAMINB12, FOLATE, FERRITIN, TIBC, IRON, RETICCTPCT in the last 72 hours. Sepsis Labs: No results for input(s): PROCALCITON, LATICACIDVEN in the last 168 hours.  No results found for this or any previous visit (from the past 240 hour(s)).       Radiology Studies: No results found.      Scheduled Meds: . amLODipine  10 mg Per Tube Daily  . Carbidopa-Levodopa ER  1 tablet Per Tube 5 X Daily  . chlorhexidine gluconate (MEDLINE KIT)  15 mL Mouth Rinse BID  . Chlorhexidine Gluconate Cloth  6 each Topical Daily  . docusate  50 mg Per Tube Daily  . feeding supplement (OSMOLITE 1.5 CAL)  474 mL Per Tube TID  . feeding supplement (PRO-STAT SUGAR FREE 64)  30 mL Per Tube TID  . free water  400 mL Per Tube Q8H  . insulin aspart  0-15 Units Subcutaneous TID WC  . mouth rinse  15 mL Mouth Rinse 10 times per [place]  . pantoprazole sodium  40 mg Per Tube Daily  . Pimavanserin Tartrate  34 mg Per Tube Daily  . pyridostigmine  60 mg Per Tube Daily  . rosuvastatin  10 mg Per Tube q1800   Continuous Infusions: . sodium chloride Stopped (06/10/19 0000)  . sodium chloride Stopped (06/21/19 1744)     LOS: 30 days    Time spent: 26 minutes with over 50% of the time coordinating the patient's care    Harold Hedge, DO [facility] Pager [phone]  If 7PM-7AM, please contact night-coverage [email or web address] Password [email or web address] 07/06/2019, 1:43 PM

## 2019-07-07 DIAGNOSIS — J9601 Acute respiratory failure with hypoxia: Secondary | ICD-10-CM | POA: Diagnosis not present

## 2019-07-07 DIAGNOSIS — G20 Parkinson's disease: Secondary | ICD-10-CM | POA: Diagnosis not present

## 2019-07-07 DIAGNOSIS — Z9911 Dependence on respirator [ventilator] status: Secondary | ICD-10-CM | POA: Diagnosis not present

## 2019-07-07 DIAGNOSIS — G9340 Encephalopathy, unspecified: Secondary | ICD-10-CM | POA: Diagnosis not present

## 2019-07-07 DIAGNOSIS — D649 Anemia, unspecified: Secondary | ICD-10-CM

## 2019-07-07 LAB — GLUCOSE, CAPILLARY
Glucose-Capillary: 99 mg/dL (ref 70–99)
Glucose-Capillary: 122 mg/dL — ABNORMAL HIGH (ref 70–99)
Glucose-Capillary: 113 mg/dL — ABNORMAL HIGH (ref 70–99)
Glucose-Capillary: 148 mg/dL — ABNORMAL HIGH (ref 70–99)

## 2019-07-07 MED ORDER — NOREPINEPHRINE 4 MG/250ML-% IV SOLN
INTRAVENOUS | Status: AC
  Filled 2019-07-07: qty 250

## 2019-07-07 NOTE — Progress Notes (Signed)
Palliative: Chart reviewed. Mr. Faulk remains unchanged.   Goals are set.  Palliative team to shadow for decline.   Plan:GOALS set for full code/full scope.  Working towards long term vent placement. PMT to continue to shadow for declines.   No charge  [name], NP Palliative Medicine Team Team Phone # [phone] Greater than 50% of this time was spent counseling and coordinating care related to the above assessment and plan

## 2019-07-07 NOTE — Progress Notes (Signed)
PROGRESS NOTE    Derek Harrell    Code Status: Full Code  ONG:[id number] DOB: 10/15/1945 DOA: 06/06/2019  PCP: [name], MD    Hospital Summary  73 year old male with history of Parkinson's dementia, hypertension, hyperlipidemia, OSA (did not tolerate CPAP), AAA, history of recent endovascular repair of aortic aneurysm 3 days prior to presentation on 06/02/2019, prior unspecified CVA, cellulitis, restless leg syndrome presented on 06/06/2019 with worsening weakness, mental status change, hypertension and fever.  He was admitted to ICU, developed third-degree heart block with agonal respirations with thick mucus suctioned requiring intubation.  He was extubated on 06/08/2019.  Transferred out to the floor on 06/09/2019.  On 11 6:20 AM, he suffered cardiac arrest requiring ACLS and intubation.  He was transferred back to ICU.  Post rewarming, patient has had no improvement in mental status with MRI consistent with hypoxic encephalopathy.  He is status post tracheostomy/PEG tube placement on 06/24/2019.  He is still on a ventilator via tracheostomy.  He was transferred to [facility] service on 06/28/2019.  PCCM will keep following for tracheostomy and vent.  Palliative care has evaluated the patient.  Patient remains full code.  A & P   Active Problems:   Hypertensive emergency without congestive heart failure   Pressure injury of skin   Goals of care, counseling/discussion   Hypoxia   Encephalopathy acute   Dementia without behavioral disturbance (HCC)   Cardiac arrest ([place])   Acute respiratory failure with hypoxia ([place])   Palliative care by specialist   Ventilator dependent ([place])  Acute hypoxic respiratory failure status post intubation and subsequent tracheostomy on ventilator Concern for aspiration event with ongoing issues with swallowing and secretion management Obstructive sleep apnea, noncompliant with treatment at baseline Status post PEG tube placement Tracheostomy and PEG tube were  placed on 06/24/2019.  Transferred to [facility] service on 06/28/2019.   PCCM following for vent/tracheostomy management. Not tolerating vent wean -I updated the wife today and she continues to wish to pursue current measures.  Patient remains full code   Cardiac arrest, possibly medication (pyridostigmine) induced Intermittent bradycardia Status post bradycardic arrest lasting for about 12 minutes on 06/10/2019.  After rewarming, will without improvement Pyridostigmine (Mestinon) can cause cardiac arrhythmia, bradycardia and cardiac arrest, respiratory arrest possibly the mechanism behind the patient's symptoms?   Mestinon is renally cleared and patient had an AKI on 11/6 indicating that this medication may have been the inciting factor.  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  This has been discussed by myself and palliative care multiple times with patient's wife who is in denial of patient's current situation  -Consider ethics consult  Parkinson's disease with dementia with progressive debilitation Myasthenic gravis Pyridostigmine discontinued 12/2 -On Nuplazid, wife is to bring home med this afternoon  Hypertension  -Blood pressure stable.  Continue Norvasc  Acute on chronic anemia of chronic disease Transfused 2 units of packed red cells on 06/26/2019.   Has been stable, follow-up in a.m.  Hypernatremia Resolved with increased free water flushes Has been stable -follow-up in a.m.  DVT prophylaxis: SCD Diet: Tube feeds Family Communication: Wife updated by phone. Disposition Plan: Poor prognosis. Awaiting insurance approval and SNF placement  Consultants  Palliative PCCM General surgery Cardiology Neurology Vascular surgery  Procedures   Endotracheal tube: 06/07/2019-06/08/2019; 06/10/2019 onwards Tracheostomy and PEG tube placement  on 06/24/2019 CVC left IJ: 06/10/2019-06/15/2019   Antibiotics   Anti-infectives (From admission, onward)   Start     Dose/Rate Route Frequency Ordered Stop   06/14/19 0600  vancomycin (VANCOCIN) 1,500 mg in sodium chloride 0.9 % 500 mL IVPB  Status:  Discontinued     1,500 mg 250 mL/hr over 120 Minutes Intravenous Every 24 hours 06/13/19 1207 06/13/19 1220   06/13/19 1800  vancomycin (VANCOCIN) 1,500 mg in sodium chloride 0.9 % 500 mL IVPB  Status:  Discontinued     1,500 mg 250 mL/hr over 120 Minutes Intravenous Every 24 hours 06/13/19 1220 06/14/19 0944   06/10/19 0615  vancomycin (VANCOCIN) IVPB 1000 mg/200 mL premix  Status:  Discontinued     1,000 mg 200 mL/hr over 60 Minutes Intravenous Every 12 hours 06/10/19 0605 06/13/19 1207   06/10/19 0615  ceFEPIme (MAXIPIME) 2 g in sodium chloride 0.9 % 100 mL IVPB  Status:  Discontinued     2 g 200 mL/hr over 30 Minutes Intravenous Every 8 hours 06/10/19 0605 06/14/19 0944           Subjective   Continues to be unresponsive on exam  Objective   Vitals:   07/07/19 0600 07/07/19 0700 07/07/19 0720 07/07/19 0800  BP: 100/64 116/68 116/68 125/72  Pulse: 64 67 69 73  Resp: '16 16 16 16  '$ Temp:    98.6 F (37 C)  TempSrc:    Oral  SpO2: 100% 100% 100% 100%  Weight:      Height:        Intake/Output Summary (Last 24 hours) at 07/07/2019 1111 Last data filed at 07/07/2019 8315 Gross per 24 hour  Intake 905 ml  Output 2325 ml  Net -1420 ml   Filed Weights   07/05/19 0400 07/06/19 0403 07/07/19 0331  Weight: 81.2 kg 78.7 kg 77.7 kg    Examination:  Physical Exam Vitals signs and nursing note reviewed.  Constitutional:      Comments: Not responsive verbal stimuli  HENT:     Head:     Comments: Trach    Mouth/Throat:     Mouth: Mucous membranes are moist.  Eyes:     Pupils: Pupils are equal, round, and reactive to light.   Cardiovascular:     Rate and Rhythm: Normal rate and regular rhythm.     Comments: Sinus rhythm on telemetry Pulmonary:     Effort: Pulmonary effort is normal.     Breath sounds: Normal breath sounds.     Comments: Vent dependent Abdominal:     General: Abdomen is flat.     Palpations: Abdomen is soft.  Musculoskeletal:     Comments: SCDs  Neurological:     Comments: Encephalopathic, appears at new baseline     Data Reviewed: I have personally reviewed following labs and imaging studies  CBC: Recent Labs  Lab 07/01/19 0430 07/02/19 0256 07/03/19 0312 07/04/19 0343  WBC 9.4 10.2 9.1 8.6  NEUTROABS 6.8  --   --   --   HGB 8.6* 8.3* 8.4* 8.7*  HCT 27.5* 26.8* 26.7* 28.2*  MCV 87.9 88.2 87.8 87.3  PLT 185 189 190 176   Basic Metabolic Panel: Recent Labs  Lab 07/01/19 0430 07/02/19 0256 07/03/19 0312 07/04/19 0343  NA 151* 148* 147* 144  K 4.5 4.7 5.1 5.1  CL 111 109 108 107  CO2 '29 27 25 25  '$ GLUCOSE 122* 120* 120* 115*  BUN 52* 54* 54* 54*  CREATININE 0.90  0.96 1.01 0.98  CALCIUM 8.9 8.5* 8.6* 8.5*  MG 2.4  --   --   --    GFR: Estimated Creatinine Clearance: 75.9 mL/min (by C-G formula based on SCr of 0.98 mg/dL). Liver Function Tests: No results for input(s): AST, ALT, ALKPHOS, BILITOT, PROT, ALBUMIN in the last 168 hours. No results for input(s): LIPASE, AMYLASE in the last 168 hours. No results for input(s): AMMONIA in the last 168 hours. Coagulation Profile: No results for input(s): INR, PROTIME in the last 168 hours. Cardiac Enzymes: No results for input(s): CKTOTAL, CKMB, CKMBINDEX, TROPONINI in the last 168 hours. BNP (last 3 results) No results for input(s): PROBNP in the last 8760 hours. HbA1C: No results for input(s): HGBA1C in the last 72 hours. CBG: Recent Labs  Lab 07/06/19 1140 07/06/19 1502 07/06/19 2135 07/07/19 0740 07/07/19 1058  GLUCAP 129* 118* 139* 99 122*   Lipid Profile: No results for input(s): CHOL, HDL, LDLCALC, TRIG,  CHOLHDL, LDLDIRECT in the last 72 hours. Thyroid Function Tests: No results for input(s): TSH, T4TOTAL, FREET4, T3FREE, THYROIDAB in the last 72 hours. Anemia Panel: No results for input(s): VITAMINB12, FOLATE, FERRITIN, TIBC, IRON, RETICCTPCT in the last 72 hours. Sepsis Labs: No results for input(s): PROCALCITON, LATICACIDVEN in the last 168 hours.  No results found for this or any previous visit (from the past 240 hour(s)).       Radiology Studies: No results found.      Scheduled Meds: . amLODipine  10 mg Per Tube Daily  . Carbidopa-Levodopa ER  1 tablet Per Tube 5 X Daily  . chlorhexidine gluconate (MEDLINE KIT)  15 mL Mouth Rinse BID  . Chlorhexidine Gluconate Cloth  6 each Topical Daily  . docusate  50 mg Per Tube Daily  . feeding supplement (OSMOLITE 1.5 CAL)  474 mL Per Tube TID  . feeding supplement (PRO-STAT SUGAR FREE 64)  30 mL Per Tube TID  . free water  400 mL Per Tube Q8H  . insulin aspart  0-15 Units Subcutaneous TID WC  . mouth rinse  15 mL Mouth Rinse 10 times per [place]  . pantoprazole sodium  40 mg Per Tube Daily  . Pimavanserin Tartrate  34 mg Per Tube Daily  . rosuvastatin  10 mg Per Tube q1800   Continuous Infusions: . sodium chloride Stopped (06/10/19 0000)  . sodium chloride Stopped (06/21/19 1744)     LOS: 31 days    Time spent: 30 minutes with over 50% of the time coordinating the patient's care    Harold Hedge, DO [facility] Pager [phone]  If 7PM-7AM, please contact night-coverage [email or web address] Password [email or web address] 07/07/2019, 11:11 AM

## 2019-07-07 NOTE — TOC Progression Note (Signed)
Transition of Care [facility]) - Progression Note    Patient Details  Name: Laurice Estabrook MRN: [id number] Date of Birth: [birth year]  Transition of Care [facility]) CM/SW Contact  Bartholomew Crews, RN Phone Number: [phone] 07/07/2019, 4:33 PM  Clinical Narrative:    Spoke with patient's spouse, [name], on the phone. Discussed applying for Medicaid (not long term). [name] to follow up with DSS tomorrow. [name] is also in the process of applying for guardianship, however, in the holiday season court dates have been limited. [name] asked about local vent SNFs - discussed only [facility] and will not know if bed available until Medicaid is approved. [name] asked about a SNF in [place] - advised that there is a [facility] in [place] that accepts vent patients, but again vent beds are limited. TOC continuing to follow for transition needs.    Expected Discharge Plan: Long Term Nursing Home Barriers to Discharge: Continued Medical Work up  Expected Discharge Plan and Services Expected Discharge Plan: [place] In-house Referral: Clinical Social Work Discharge Planning Services: CM Consult   Living arrangements for the past 2 months: [place] Determinants of Health (SDOH) Interventions    Readmission Risk Interventions Readmission Risk Prevention Plan 06/03/2019  Transportation Screening Complete  PCP or Specialist Appt within 3-5 Days Complete  HRI or [place] Complete  Social Work Consult for [place] Planning/Counseling Complete  Palliative Care Screening Not Applicable  Medication Review [employer] photographer) Complete  Some recent data might be hidden

## 2019-07-08 DIAGNOSIS — I161 Hypertensive emergency: Secondary | ICD-10-CM | POA: Diagnosis not present

## 2019-07-08 DIAGNOSIS — J9601 Acute respiratory failure with hypoxia: Secondary | ICD-10-CM | POA: Diagnosis not present

## 2019-07-08 DIAGNOSIS — R0902 Hypoxemia: Secondary | ICD-10-CM | POA: Diagnosis not present

## 2019-07-08 LAB — CBC
HCT: 27.3 % — ABNORMAL LOW (ref 39.0–52.0)
Hemoglobin: 8.7 g/dL — ABNORMAL LOW (ref 13.0–17.0)
MCH: 27.4 pg (ref 26.0–34.0)
MCHC: 31.9 g/dL (ref 30.0–36.0)
MCV: 85.8 fL (ref 80.0–100.0)
Platelets: 144 10*3/uL — ABNORMAL LOW (ref 150–400)
RBC: 3.18 MIL/uL — ABNORMAL LOW (ref 4.22–5.81)
RDW: 14.6 % (ref 11.5–15.5)
WBC: 11.5 10*3/uL — ABNORMAL HIGH (ref 4.0–10.5)
nRBC: 0 % (ref 0.0–0.2)

## 2019-07-08 LAB — BASIC METABOLIC PANEL
Anion gap: 11 (ref 5–15)
BUN: 53 mg/dL — ABNORMAL HIGH (ref 8–23)
CO2: 25 mmol/L (ref 22–32)
Calcium: 8.6 mg/dL — ABNORMAL LOW (ref 8.9–10.3)
Chloride: 104 mmol/L (ref 98–111)
Creatinine, Ser: 0.89 mg/dL (ref 0.61–1.24)
GFR calc Af Amer: >60 mL/min (ref >60)
GFR calc non Af Amer: >60 mL/min (ref >60)
Glucose, Bld: 121 mg/dL — ABNORMAL HIGH (ref 70–99)
Potassium: 4.7 mmol/L (ref 3.5–5.1)
Sodium: 140 mmol/L (ref 135–145)

## 2019-07-08 LAB — GLUCOSE, CAPILLARY
Glucose-Capillary: 109 mg/dL — ABNORMAL HIGH (ref 70–99)
Glucose-Capillary: 100 mg/dL — ABNORMAL HIGH (ref 70–99)
Glucose-Capillary: 136 mg/dL — ABNORMAL HIGH (ref 70–99)
Glucose-Capillary: 154 mg/dL — ABNORMAL HIGH (ref 70–99)

## 2019-07-08 NOTE — Progress Notes (Signed)
NAME:  Derek Harrell, MRN:  [id number], DOB:  [birth year], LOS: 58 ADMISSION DATE:  06/06/2019, CONSULTATION DATE:  11/2 REFERRING MD:  Johnney Killian, CHIEF COMPLAINT:  Hypertensive crisis and fever    Brief History   73 year old male w/ h/o parkinson's dementia.  Admitted 11/2, 3 days s/p endovascular infrarenal aortic aneurysm repair on 10/29 w/ worsening weakness, MS change, HTN and fever.   Admitted to ICU, developed third degree heart block with agonal respirations with thick mucous suctioned requiring intubation.  Extubated on 11/4.  Ttransferred out to the floor 11/5 afternoon.  Then 11/6 early AM he suffered cardiac arrest.  RN alerted by telemetry to a run of VT.  He then became bradycardica and arrested. ACLS done for 12 mins prior to ROSC. He was unresponsive in the post-arrest setting requiring intubation. Transferred to ICU for TTM.  Post rewarming, patient has had no improvement in mental status with MRI consistent with hypoxic encephalopathy.   S/p tracheostomy/PEG tube placement 06/24/2019  Past Medical History  AAA, prior CVA, HTN, HL, MG, RLS, PD, OSA (did not tolerate CPAP), gait disorder   Significant Hospital Events   11/2 >>  admitted to ICU 11/3  >>Bradycardic arrest from mucus plugging.  11/4 >> extubated 11/5 >> transferred to tele/ TRH 11/6 >> PEA Cardiac Arrest, ROSC after 12 minutes >> began TTM to 36 11/7 >> Re-warming 11/7 completed at noon  11/20 >> trach, PEG by surgery. Had bleeding post procedure 11/24 >> Transfer to hospitalist service  Consults:  Vascular surgery  PMT  General surgery  Procedures:  ETT 11/3 >> 11/4, 11/6 >> CVC LIJ 11/06 >> 11/11 Right Radial Aline 11/06 >> 11/9 PEG tube placement 11/20 Tracheostomy 11/20  Significant Diagnostic Tests:  CT head 11/2 >> Stable white matter disease.  MRI brain 11/9 >> Restricted diffusion in the occipital lobes bilaterally and in the frontal parietal lobes over the convexity bilaterally compatible with  acute or subacute infarction. Atrophy and extensive chronic ischemic change in the brain. Multiple areas of chronic microhemorrhage which have progressed since 2017.  Micro Data:  Blood cultures 11/2>> negative   SARS Cov2 11/2 >> negative. Blood cultures 11/6 >> negative  Urine culture 11/6 >> negative Tracheal aspirate 11/6 >> negative  Antimicrobials:  Cefepime 11/6 >> 11/10 Vancomycin 11/6 >> 11/10  Interim history/subjective:  12/4: attempted PS today without success. Suspect pt will need 24/7 vent support. Cont to await dispo planning.  12/2: Last seen by [facility] 11/29, Family requesting SNF discharge.  Medicaid paper work started and may take up to 90 days.  Tolerated PSV from 12p-8p yesterday. Placed on rate overnight.   Objective   Blood pressure 109/72, pulse 75, temperature 98.5 F (36.9 C), temperature source Oral, resp. rate 16, height 6' (1.829 m), weight 79.2 kg, SpO2 100 %.    Vent Mode: PRVC FiO2 (%):  [30 %] 30 % Set Rate:  [16 bmp] 16 bmp Vt Set:  [620 mL] 620 mL PEEP:  [5 cmH20] 5 cmH20 Plateau Pressure:  [14 cmH20-15 cmH20] 15 cmH20   Intake/Output Summary (Last 24 hours) at 07/08/2019 0852 Last data filed at 07/08/2019 0740 Gross per 24 hour  Intake 2755 ml  Output 2075 ml  Net 680 ml   Filed Weights   07/06/19 0403 07/07/19 0331 07/08/19 0429  Weight: 78.7 kg 77.7 kg 79.2 kg   Examination:  Gen:     Intubated, minimally responsive on ventilator HEENT: [place]/AT. PERRL, trach midline Lungs:   CTA,  symmetric expansion with normal respiratory effort. Minimal secretions via trach inline suction CV:  SR on telemetry, no murmurs. Warm and well perfused, no edema.  Abd:  + bowel sounds, soft, non tender, non distended, no organomegaly Ext: No deformities Skin:  Warm and dry, no rash or wounds Neuro: PERRL, + cough/gag. Does not follow commands  Assessment & Plan:   Acute hypoxemic respiratory failure s/p trach Obstructive sleep apnea, noncompliant with  treatment at baseline Anoxic encephalopathy.   -Continue daily trach care. -Trach placed 11/20. Suture removal 12/2 -has not tolerated PSV for 2 days now.  -suspect may need 24 hr vent support, despite previous hopes after he tolerated ~8 hr psv on 12/1 -will check cxr to ensure no new process preventing toleration.   CCM will see again Monday 12/7  Unless needed sooner, please call.   Best practice:  Diet: Tube feeds at goal Pain/Anxiety/Delirium protocol (if indicated): Home meds VAP protocol (if indicated): In place DVT prophylaxis: Subcu heparin-held since 19th, scd per primary GI prophylaxis: Continue PPI Glucose control: SSI Mobility: BR Code Status: full code  Family Communication: Per primary Disposition: ICU, awaiting placement   Critical care time: The patient is critically ill with multiple organ systems failure and requires high complexity decision making for assessment and support, frequent evaluation and titration of therapies, application of advanced monitoring technologies and extensive interpretation of multiple databases.  Critical care time 32 mins. This represents my time independent of the NPs time taking care of the pt. This is excluding procedures.    [facility] 07/08/2019, 8:57 AM

## 2019-07-08 NOTE — Progress Notes (Signed)
RT NOTES: Placed patient on pressure support, no patient effort. Placed back on full support at this time.

## 2019-07-08 NOTE — Progress Notes (Signed)
Hospitalist daily note   Derek Harrell [id number] DOB: 1945/11/30 DOA: 06/06/2019  PCP: [name], MD   Narrative:  57 black male Parkinson's aortic aneurysm repair 10/29 OSA no CPAP RLS-admitted 11/2 mental status change-11/3 bradycardic arrest, intubated and transferred to floor 11/5-had cardiac arrest 11/6 rewarmed, trach PEG 11/20-tried assumed care 11/24  Data Reviewed:  BUN/creatinine 53/0.8 Hemoglobin 8.7, WBC 11.5 platelet 144 Assessment & Plan: Anoxic brain injury-needs Vent, PEG active very poor overall prognosis Acute hypoxic respiratory failure-chronic trach pulmonology following-trial to wean 12/1 now on pressure support 10/5 unlikely will be able to decannulate periodic CXR Bradycardic arrest 11/3 cardiac arrest 11/6-poor prognosis continue amlodipine 10 daily PEG feeds continue free water 400 with bolus feeds--cover with SSI, CBG 100-121 Parkinson's-continue carbidopa 1 tab 5 times daily,Pimavanserin 34 mg every 2  Aortic aneurym repair h/o  Subjective: Minimally responsive--rouses to aggressive stimulaiton  Consultants:   palliative Procedures:    Antimicrobials:   None current    Objective: Vitals:   07/08/19 0500 07/08/19 0600 07/08/19 0700 07/08/19 0730  BP: 117/79 114/78 109/72 109/72  Pulse: 78 76 75 75  Resp: _0 Temp:      TempSrc:      SpO2: 100% 100% 100% 100%  Weight:      Height:        Intake/Output Summary (Last 24 hours) at 07/08/2019 0801 Last data filed at 07/08/2019 2130 Gross per 24 hour  Intake 2745 ml  Output 2075 ml  Net 670 ml   Filed Weights   07/06/19 0403 07/07/19 0331 07/08/19 0429  Weight: 78.7 kg 77.7 kg 79.2 kg    Examination: Cachectic sleepy appearing African-American male trach in place pupils reactive with cataracts CTA B bilaterally to lateral lung fields abdomen has PEG tube abdomen slightly distended Prevalon boots on both feet no wounds no breakdown Cannot meaningfully interact with  patient  Scheduled Meds: . amLODipine  10 mg Per Tube Daily  . Carbidopa-Levodopa ER  1 tablet Per Tube 5 X Daily  . chlorhexidine gluconate (MEDLINE KIT)  15 mL Mouth Rinse BID  . Chlorhexidine Gluconate Cloth  6 each Topical Daily  . docusate  50 mg Per Tube Daily  . feeding supplement (OSMOLITE 1.5 CAL)  474 mL Per Tube TID  . feeding supplement (PRO-STAT SUGAR FREE 64)  30 mL Per Tube TID  . free water  400 mL Per Tube Q8H  . insulin aspart  0-15 Units Subcutaneous TID WC  . mouth rinse  15 mL Mouth Rinse 10 times per [place]  . pantoprazole sodium  40 mg Per Tube Daily  . Pimavanserin Tartrate  34 mg Per Tube Daily  . rosuvastatin  10 mg Per Tube q1800   Continuous Infusions: . sodium chloride Stopped (06/10/19 0000)  . sodium chloride Stopped (06/21/19 1744)     LOS: 32 days   Time spent:  Verneita Griffes, MD [facility]

## 2019-07-09 DIAGNOSIS — I161 Hypertensive emergency: Secondary | ICD-10-CM | POA: Diagnosis not present

## 2019-07-09 LAB — COMPREHENSIVE METABOLIC PANEL
ALT: 6 U/L (ref 0–44)
AST: 35 U/L (ref 15–41)
Albumin: 2.4 g/dL — ABNORMAL LOW (ref 3.5–5.0)
Alkaline Phosphatase: 85 U/L (ref 38–126)
Anion gap: 8 (ref 5–15)
BUN: 52 mg/dL — ABNORMAL HIGH (ref 8–23)
CO2: 25 mmol/L (ref 22–32)
Calcium: 8.9 mg/dL (ref 8.9–10.3)
Chloride: 104 mmol/L (ref 98–111)
Creatinine, Ser: 0.79 mg/dL (ref 0.61–1.24)
GFR calc Af Amer: >60 mL/min (ref >60)
GFR calc non Af Amer: >60 mL/min (ref >60)
Glucose, Bld: 117 mg/dL — ABNORMAL HIGH (ref 70–99)
Potassium: 4.3 mmol/L (ref 3.5–5.1)
Sodium: 137 mmol/L (ref 135–145)
Total Bilirubin: 0.8 mg/dL (ref 0.3–1.2)
Total Protein: 6.7 g/dL (ref 6.5–8.1)

## 2019-07-09 LAB — GLUCOSE, CAPILLARY
Glucose-Capillary: 100 mg/dL — ABNORMAL HIGH (ref 70–99)
Glucose-Capillary: 107 mg/dL — ABNORMAL HIGH (ref 70–99)
Glucose-Capillary: 108 mg/dL — ABNORMAL HIGH (ref 70–99)
Glucose-Capillary: 120 mg/dL — ABNORMAL HIGH (ref 70–99)
Glucose-Capillary: 128 mg/dL — ABNORMAL HIGH (ref 70–99)
Glucose-Capillary: 97 mg/dL (ref 70–99)

## 2019-07-09 LAB — PHOSPHORUS: Phosphorus: 4.3 mg/dL (ref 2.5–4.6)

## 2019-07-09 LAB — MAGNESIUM: Magnesium: 2.1 mg/dL (ref 1.7–2.4)

## 2019-07-09 NOTE — Progress Notes (Signed)
Palliative: Chart reviewed. Derek Harrell remains unchanged.Conference with bedside nursing staff related to patient condition, needs.  Support provided.   Goals are set.  Palliative team to shadow for decline.   Plan:GOALS set for full code/full scope. Anticipate long term vent placement, vs SNF.  Per nursing staff, wife is considering home if able.  PMT to continue to shadow for declines.   No charge Derek Axe, NP Palliative Medicine Team Team Phone # [phone] Greater than 50% of this time was spent counseling and coordinating care related to the above assessment and plan

## 2019-07-09 NOTE — Progress Notes (Signed)
Hospitalist daily note   Derek Harrell [id number] DOB: [birth year] DOA: 06/06/2019  PCP: [name], MD   Narrative:  52 black male Parkinson's aortic aneurysm repair 10/29 OSA no CPAP RLS-admitted 11/2 mental status change-11/3 bradycardic arrest, intubated and transferred to floor 11/5-had cardiac arrest 11/6 rewarmed, trach PEG 11/20-tried assumed care 11/24  Data Reviewed:  BUN/creatinine 53/0.8 Hemoglobin 8.7, WBC 11.5 platelet 144 Assessment & Plan: Anoxic brain injury-needs Vent, PEG active very poor overall prognosis Acute hypoxic respiratory failure-chronic trach pulmonology following-trial to wean 12/1 now on pressure support 10/5 unlikely will be able to decannulate periodic CXR Bradycardic arrest 11/3 cardiac arrest 11/6-poor prognosis continue amlodipine 10 daily PEG feeds continue free water 400 with bolus feeds--cover with SSI, CBG 97-108 Parkinson's-continue carbidopa 1 tab 5 times daily,Pimavanserin 34 mg every 2  Aortic aneurym repair h/o  I had a long discussion with the patient's wife at the bedside and try to elicit what she understood of patient's events-she tells me she understands everything perfectly and hopes that he will recover despite medical opinions otherwise and "angels will save him"-I was respectful of her beliefs and wishes and also shared that I hope that he does recover but did not comment on prognosis or expected outcomes as she seems very firmly routed and grounded in her believes which I do not think I would change by any other discussion-she is aware that case manager will try to place him and expresses some anxiety about being able to transfer to another unit and have assured her we will try and make sure that he is well monitored wherever he goes  Subjective: Minimally responsive--no distress no significant differences from prior  Consultants:   palliative Procedures:    Antimicrobials:   None current   Objective: Vitals:   07/09/19 1115  07/09/19 1200 07/09/19 1300 07/09/19 1400  BP:  115/67 118/67 118/67  Pulse:  78 81 80  Resp:  '17 16 20  '$ Temp:      TempSrc:      SpO2: 100% 100% 99% 100%  Weight:      Height:        Intake/Output Summary (Last 24 hours) at 07/09/2019 1449 Last data filed at 07/09/2019 1405 Gross per 24 hour  Intake 2220 ml  Output 2375 ml  Net -155 ml   Filed Weights   07/07/19 0331 07/08/19 0429 07/09/19 0341  Weight: 77.7 kg 79.2 kg 78.2 kg    Examination: Cachectic unable to orient no pallor no icterus chest clear no added sound trach in place patient on 30% 10/5 PEG tube in place rest as per prior  Scheduled Meds: . amLODipine  10 mg Per Tube Daily  . Carbidopa-Levodopa ER  1 tablet Per Tube 5 X Daily  . chlorhexidine gluconate (MEDLINE KIT)  15 mL Mouth Rinse BID  . Chlorhexidine Gluconate Cloth  6 each Topical Daily  . docusate  50 mg Per Tube Daily  . feeding supplement (OSMOLITE 1.5 CAL)  474 mL Per Tube TID  . feeding supplement (PRO-STAT SUGAR FREE 64)  30 mL Per Tube TID  . free water  400 mL Per Tube Q8H  . insulin aspart  0-15 Units Subcutaneous TID WC  . mouth rinse  15 mL Mouth Rinse 10 times per [place]  . pantoprazole sodium  40 mg Per Tube Daily  . Pimavanserin Tartrate  34 mg Per Tube Daily  . rosuvastatin  10 mg Per Tube q1800   Continuous Infusions: . sodium chloride Stopped (  06/10/19 0000)  . sodium chloride Stopped (06/21/19 1744)     LOS: 33 days   Time spent:  Verneita Griffes, MD [facility]

## 2019-07-09 NOTE — Progress Notes (Signed)
4 beat run of vtach. Notified MD. Will continue to monitor.  Jackalyn Lombard

## 2019-07-09 NOTE — Progress Notes (Signed)
[facility] Progress Note Patient Name: Derek Harrell DOB: 1945/10/17 MRN: [id number]   Date of Service  07/09/2019  HPI/Events of Note  Notified of non sustained Vtach. No significant electrolyte abnormality  eICU Interventions  Ordered to add on Mg and Phos     Intervention Category Major Interventions: Arrhythmia - evaluation and management  Shona Needles [name] 07/09/2019, 7:01 AM

## 2019-07-10 ENCOUNTER — Inpatient Hospital Stay (HOSPITAL_COMMUNITY): Payer: Medicare Other

## 2019-07-10 DIAGNOSIS — I161 Hypertensive emergency: Secondary | ICD-10-CM | POA: Diagnosis not present

## 2019-07-10 LAB — GLUCOSE, CAPILLARY
Glucose-Capillary: 99 mg/dL (ref 70–99)
Glucose-Capillary: 105 mg/dL — ABNORMAL HIGH (ref 70–99)
Glucose-Capillary: 137 mg/dL — ABNORMAL HIGH (ref 70–99)
Glucose-Capillary: 136 mg/dL — ABNORMAL HIGH (ref 70–99)

## 2019-07-10 NOTE — Progress Notes (Signed)
Hospitalist daily note   Derek Harrell [id number] DOB: Dec 19, 1945 DOA: 06/06/2019  PCP: [name], MD  Narrative:  59 black male Parkinson's aortic aneurysm repair 10/29 OSA no CPAP RLS-admitted 11/2 mental status change-11/3 bradycardic arrest, intubated and transferred to floor 11/5-had cardiac arrest 11/6 rewarmed, trach PEG 11/20-tried assumed care 11/24  Data Reviewed:  BUN/creatinine 53/0.8 Hemoglobin 8.7, WBC 11.5 platelet 144 Assessment & Plan: Anoxic brain injury-needs Vent, PEG active very poor overall prognosis--for thicker secretions will get P CXR--if fever or PNA suggested will consider abx Acute hypoxic respiratory failure-chronic trach pulmonology following-trial to wean 12/1 now on PS 10/5 unlikely will be able to decannulate periodic CXR Bradycardic arrest 11/3 cardiac arrest 11/6-poor prognosis continue amlodipine 10 daily PEG feeds continue free water 400 with bolus feeds--cover with SSI, CBG 97-128 Parkinson's-continue carbidopa 1 tab 5 times daily,Pimavanserin 34 mg every 2  Aortic aneurym repair h/o  Wife at bedside, lovenox, full code,  Hard disposition  Subjective: remains about the same rn reports thicker secretions overnight-no fever no chills reported I am not able to obtain any meaningful responsiveness from her  Consultants:   palliative Procedures:    Antimicrobials:   None current   Objective: Vitals:   07/10/19 1105 07/10/19 1111 07/10/19 1200 07/10/19 1300  BP:   130/61 116/77  Pulse:   (!) 59 78  Resp:   (!) 21 20  Temp:  98.5 F (36.9 C)    TempSrc:  Oral    SpO2: 100%  100% 100%  Weight:      Height:        Intake/Output Summary (Last 24 hours) at 07/10/2019 1457 Last data filed at 07/10/2019 1000 Gross per 24 hour  Intake 225 ml  Output 1825 ml  Net -1600 ml   Filed Weights   07/08/19 0429 07/09/19 0341 07/10/19 0200  Weight: 79.2 kg 78.2 kg 80.7 kg   Examination:  Cachectic unable to orient  no pallor no icterus   chest clear no added sound trach in place patient on 30% 10/5  PEG tube in place rest as per prior prevalon boots  Scheduled Meds: . amLODipine  10 mg Per Tube Daily  . Carbidopa-Levodopa ER  1 tablet Per Tube 5 X Daily  . chlorhexidine gluconate (MEDLINE KIT)  15 mL Mouth Rinse BID  . Chlorhexidine Gluconate Cloth  6 each Topical Daily  . docusate  50 mg Per Tube Daily  . feeding supplement (OSMOLITE 1.5 CAL)  474 mL Per Tube TID  . feeding supplement (PRO-STAT SUGAR FREE 64)  30 mL Per Tube TID  . free water  400 mL Per Tube Q8H  . insulin aspart  0-15 Units Subcutaneous TID WC  . mouth rinse  15 mL Mouth Rinse 10 times per [place]  . pantoprazole sodium  40 mg Per Tube Daily  . Pimavanserin Tartrate  34 mg Per Tube Daily  . rosuvastatin  10 mg Per Tube q1800   Continuous Infusions: . sodium chloride Stopped (06/10/19 0000)  . sodium chloride Stopped (06/21/19 1744)     LOS: 34 days   Time spent:  Verneita Griffes, MD [facility]

## 2019-07-11 ENCOUNTER — Other Ambulatory Visit: Payer: Self-pay

## 2019-07-11 DIAGNOSIS — I161 Hypertensive emergency: Secondary | ICD-10-CM | POA: Diagnosis not present

## 2019-07-11 LAB — CBC WITH DIFFERENTIAL/PLATELET
Abs Immature Granulocytes: 0.06 10*3/uL (ref 0.00–0.07)
Basophils Absolute: 0 10*3/uL (ref 0.0–0.1)
Basophils Relative: 0 %
Eosinophils Absolute: 0.4 10*3/uL (ref 0.0–0.5)
Eosinophils Relative: 4 %
HCT: 26.5 % — ABNORMAL LOW (ref 39.0–52.0)
Hemoglobin: 8.6 g/dL — ABNORMAL LOW (ref 13.0–17.0)
Immature Granulocytes: 1 %
Lymphocytes Relative: 14 %
Lymphs Abs: 1.4 10*3/uL (ref 0.7–4.0)
MCH: 26.8 pg (ref 26.0–34.0)
MCHC: 32.5 g/dL (ref 30.0–36.0)
MCV: 82.6 fL (ref 80.0–100.0)
Monocytes Absolute: 1 10*3/uL (ref 0.1–1.0)
Monocytes Relative: 10 %
Neutro Abs: 7 10*3/uL (ref 1.7–7.7)
Neutrophils Relative %: 71 %
Platelets: 163 10*3/uL (ref 150–400)
RBC: 3.21 MIL/uL — ABNORMAL LOW (ref 4.22–5.81)
RDW: 14.5 % (ref 11.5–15.5)
WBC: 9.9 10*3/uL (ref 4.0–10.5)
nRBC: 0 % (ref 0.0–0.2)

## 2019-07-11 LAB — COMPREHENSIVE METABOLIC PANEL
ALT: 11 U/L (ref 0–44)
AST: 35 U/L (ref 15–41)
Albumin: 2.1 g/dL — ABNORMAL LOW (ref 3.5–5.0)
Alkaline Phosphatase: 88 U/L (ref 38–126)
Anion gap: 10 (ref 5–15)
BUN: 54 mg/dL — ABNORMAL HIGH (ref 8–23)
CO2: 26 mmol/L (ref 22–32)
Calcium: 8.7 mg/dL — ABNORMAL LOW (ref 8.9–10.3)
Chloride: 103 mmol/L (ref 98–111)
Creatinine, Ser: 0.85 mg/dL (ref 0.61–1.24)
GFR calc Af Amer: >60 mL/min (ref >60)
GFR calc non Af Amer: >60 mL/min (ref >60)
Glucose, Bld: 119 mg/dL — ABNORMAL HIGH (ref 70–99)
Potassium: 4.7 mmol/L (ref 3.5–5.1)
Sodium: 139 mmol/L (ref 135–145)
Total Bilirubin: 0.8 mg/dL (ref 0.3–1.2)
Total Protein: 6.4 g/dL — ABNORMAL LOW (ref 6.5–8.1)

## 2019-07-11 LAB — GLUCOSE, CAPILLARY
Glucose-Capillary: 97 mg/dL (ref 70–99)
Glucose-Capillary: 90 mg/dL (ref 70–99)
Glucose-Capillary: 111 mg/dL — ABNORMAL HIGH (ref 70–99)

## 2019-07-11 MED ORDER — COLLAGENASE 250 UNIT/GM EX OINT
TOPICAL_OINTMENT | Freq: Every day | CUTANEOUS | Status: DC
  Administered 2019-07-11: 10:00:00 via TOPICAL
  Administered 2019-07-12: 1 via TOPICAL
  Administered 2019-07-13: 10:00:00 via TOPICAL
  Administered 2019-07-14 – 2019-07-15 (×2): 1 via TOPICAL
  Administered 2019-07-16 – 2019-07-18 (×3): via TOPICAL
  Administered 2019-07-19 – 2019-07-20 (×2): 1 via TOPICAL
  Filled 2019-07-11: qty 30

## 2019-07-11 MED ORDER — NUPLAZID 34 MG PO CAPS: 1.0000 | ORAL_CAPSULE | Freq: Every day | ORAL | 3 refills | Status: DC

## 2019-07-11 MED ORDER — GLYCOPYRROLATE 1 MG PO TABS
1.0000 mg | ORAL_TABLET | Freq: Every day | ORAL | Status: DC | PRN
  Administered 2019-07-15 – 2019-07-23 (×3): 1 mg via ORAL
  Filled 2019-07-11 (×4): qty 1

## 2019-07-11 MED ORDER — FREE WATER
200.0000 mL | Freq: Three times a day (TID) | Status: DC
  Administered 2019-07-11 – 2019-07-17 (×18): 200 mL

## 2019-07-11 NOTE — Consult Note (Signed)
WOC Nurse Consult Note: Reason for Consult: Consult requested for sacrum wound Wound type: Chronic unstageable wound Pressure Injury POA: Yes Measurement: 5X8X.3cm Wound bed: 50% slough in the center, 50% pink and dry to outer wound edges Drainage (amount, consistency, odor) mod amt tan drainage with strong odor Dressing procedure/placement/frequency: Topical treatment orders provided for bedside nurses to perform daily as follows to provide enzymatic debridement of nonviable tissue:  Apply Santyl ointment to sacrum wound Q [place], then cover with moist 4X4 and foam dressing.  (Change foam dressing Q 3 days or PRN soiling.) Pt is on a low airloss mattress to reduce pressure. Please re-consult if further assistance is needed.  Thank-you,  Julien Girt MSN, [place], [place], [place], [place]

## 2019-07-11 NOTE — Progress Notes (Addendum)
Hospitalist daily note   Derek Harrell [id number] DOB: 01/14/1946 DOA: 06/06/2019  PCP: Crawford, Elizabeth A, MD  Narrative:  73 black male Parkinson's aortic aneurysm repair 10/29 OSA no CPAP RLS-admitted 11/2 mental status change-11/3 bradycardic arrest, intubated and transferred to floor 11/5-had cardiac arrest 11/6 rewarmed, trach PEG 11/20-tried assumed care 11/24  Data Reviewed:  BUN/creatinine 53/0.8->:52/0.9 Hemoglobin 8.7->8.9, WBC 11.5-> 10.3 cxr clear 12/6 Assessment & Plan: Anoxic brain injury-needs Vent, PEG active very poor overall prognosis- Acute hypoxic respiratory failure-chronic trach pulmonology following-trial to wean 12/8--patient tol Trach collar--mental state makes decannulation unlikely--low grade F 99 12/27-CXR ok--monitor trends-obinul prn-RN reports secretions ? Bradycardic arrest 11/3 cardiac arrest 11/6-poor prognosis continue amlodipine 10 daily PEG feeds continue free water 400 with bolus feeds--cover with SSI, CBG 111-126 Parkinson's-continue carbidopa 1 tab 5 times daily,Pimavanserin 34 mg every 2  unstageable 5x8x3 cm wound-follow WOC nurse rec s Aortic aneurym repair h/o  No family today, lovenox, full code,  Hard disposition  Subjective: On Trach collar sats good Minimally responsive as per before  Consultants:   palliative  Antimicrobials:   None current   Objective: Vitals:   07/11/19 0600 07/11/19 0700 07/11/19 1000 07/11/19 1103  BP: 124/66 115/80 128/71   Pulse: 78 (!) 57    Resp: 17 16    Temp:      TempSrc:      SpO2: 100% 100%  100%  Weight:      Height:        Intake/Output Summary (Last 24 hours) at 07/11/2019 1352 Last data filed at 07/11/2019 0600 Gross per 24 hour  Intake 45 ml  Output 1550 ml  Net -1505 ml   Filed Weights   07/09/19 0341 07/10/19 0200 07/11/19 0200  Weight: 78.2 kg 80.7 kg 80.4 kg   Examination:  Cachectic unable to orient trach collar trial in progress dilated pupils ~ 3mm reastive chest clear no  added sound trach in place patient  PEG tube in place rest as per prior prevalon boots-legs not examined today  Scheduled Meds: . amLODipine  10 mg Per Tube Daily  . Carbidopa-Levodopa ER  1 tablet Per Tube 5 X Daily  . chlorhexidine gluconate (MEDLINE KIT)  15 mL Mouth Rinse BID  . Chlorhexidine Gluconate Cloth  6 each Topical Daily  . collagenase   Topical Daily  . docusate  50 mg Per Tube Daily  . feeding supplement (OSMOLITE 1.5 CAL)  474 mL Per Tube TID  . feeding supplement (PRO-STAT SUGAR FREE 64)  30 mL Per Tube TID  . free water  200 mL Per Tube Q8H  . insulin aspart  0-15 Units Subcutaneous TID WC  . mouth rinse  15 mL Mouth Rinse 10 times per [place]  . pantoprazole sodium  40 mg Per Tube Daily  . Pimavanserin Tartrate  34 mg Per Tube Daily  . rosuvastatin  10 mg Per Tube q1800   Continuous Infusions: . sodium chloride Stopped (06/10/19 0000)  . sodium chloride Stopped (06/21/19 1744)     LOS: 35 days   Time spent:  Jai , MD [facility]   

## 2019-07-12 DIAGNOSIS — J9601 Acute respiratory failure with hypoxia: Secondary | ICD-10-CM | POA: Diagnosis not present

## 2019-07-12 DIAGNOSIS — G9340 Encephalopathy, unspecified: Secondary | ICD-10-CM | POA: Diagnosis not present

## 2019-07-12 DIAGNOSIS — I469 Cardiac arrest, cause unspecified: Secondary | ICD-10-CM | POA: Diagnosis not present

## 2019-07-12 DIAGNOSIS — R0902 Hypoxemia: Secondary | ICD-10-CM | POA: Diagnosis not present

## 2019-07-12 LAB — COMPREHENSIVE METABOLIC PANEL
ALT: 8 U/L (ref 0–44)
AST: 33 U/L (ref 15–41)
Albumin: 2.3 g/dL — ABNORMAL LOW (ref 3.5–5.0)
Alkaline Phosphatase: 77 U/L (ref 38–126)
Anion gap: 9 (ref 5–15)
BUN: 52 mg/dL — ABNORMAL HIGH (ref 8–23)
CO2: 26 mmol/L (ref 22–32)
Calcium: 8.9 mg/dL (ref 8.9–10.3)
Chloride: 104 mmol/L (ref 98–111)
Creatinine, Ser: 0.9 mg/dL (ref 0.61–1.24)
GFR calc Af Amer: >60 mL/min (ref >60)
GFR calc non Af Amer: >60 mL/min (ref >60)
Glucose, Bld: 120 mg/dL — ABNORMAL HIGH (ref 70–99)
Potassium: 4.3 mmol/L (ref 3.5–5.1)
Sodium: 139 mmol/L (ref 135–145)
Total Bilirubin: 0.6 mg/dL (ref 0.3–1.2)
Total Protein: 6.9 g/dL (ref 6.5–8.1)

## 2019-07-12 LAB — CBC WITH DIFFERENTIAL/PLATELET
Abs Immature Granulocytes: 0.06 10*3/uL (ref 0.00–0.07)
Basophils Absolute: 0 10*3/uL (ref 0.0–0.1)
Basophils Relative: 0 %
Eosinophils Absolute: 0.3 10*3/uL (ref 0.0–0.5)
Eosinophils Relative: 3 %
HCT: 28.2 % — ABNORMAL LOW (ref 39.0–52.0)
Hemoglobin: 8.9 g/dL — ABNORMAL LOW (ref 13.0–17.0)
Immature Granulocytes: 1 %
Lymphocytes Relative: 12 %
Lymphs Abs: 1.2 10*3/uL (ref 0.7–4.0)
MCH: 26.3 pg (ref 26.0–34.0)
MCHC: 31.6 g/dL (ref 30.0–36.0)
MCV: 83.2 fL (ref 80.0–100.0)
Monocytes Absolute: 0.8 10*3/uL (ref 0.1–1.0)
Monocytes Relative: 8 %
Neutro Abs: 7.9 10*3/uL — ABNORMAL HIGH (ref 1.7–7.7)
Neutrophils Relative %: 76 %
Platelets: 192 10*3/uL (ref 150–400)
RBC: 3.39 MIL/uL — ABNORMAL LOW (ref 4.22–5.81)
RDW: 14.5 % (ref 11.5–15.5)
WBC: 10.3 10*3/uL (ref 4.0–10.5)
nRBC: 0 % (ref 0.0–0.2)

## 2019-07-12 LAB — GLUCOSE, CAPILLARY
Glucose-Capillary: 103 mg/dL — ABNORMAL HIGH (ref 70–99)
Glucose-Capillary: 111 mg/dL — ABNORMAL HIGH (ref 70–99)
Glucose-Capillary: 115 mg/dL — ABNORMAL HIGH (ref 70–99)
Glucose-Capillary: 126 mg/dL — ABNORMAL HIGH (ref 70–99)
Glucose-Capillary: 130 mg/dL — ABNORMAL HIGH (ref 70–99)
Glucose-Capillary: 161 mg/dL — ABNORMAL HIGH (ref 70–99)
Glucose-Capillary: 190 mg/dL — ABNORMAL HIGH (ref 70–99)

## 2019-07-12 MED ORDER — ACETAMINOPHEN 160 MG/5ML PO SOLN
650.0000 mg | Freq: Four times a day (QID) | ORAL | Status: DC | PRN
  Administered 2019-07-12 – 2019-07-13 (×2): 650 mg
  Filled 2019-07-12 (×3): qty 20.3

## 2019-07-12 MED ORDER — ACETAMINOPHEN 650 MG RE SUPP: 650.0000 mg | Freq: Four times a day (QID) | RECTAL | Status: DC | PRN

## 2019-07-12 NOTE — Progress Notes (Signed)
NAME:  Derek Harrell, MRN:  [id number], DOB:  04-Sep-1945, LOS: 92 ADMISSION DATE:  06/06/2019, CONSULTATION DATE:  11/2 REFERRING MD:  Johnney Killian, CHIEF COMPLAINT:  Hypertensive crisis and fever    Brief History   73 year old male w/ h/o parkinson's dementia.  Admitted 11/2, 3 days s/p endovascular infrarenal aortic aneurysm repair on 10/29 w/ worsening weakness, MS change, HTN and fever.   Admitted to ICU, developed third degree heart block with agonal respirations with thick mucous suctioned requiring intubation.  Extubated on 11/4.  Ttransferred out to the floor 11/5 afternoon.  Then 11/6 early AM he suffered cardiac arrest.  RN alerted by telemetry to a run of VT.  He then became bradycardica and arrested. ACLS done for 12 mins prior to ROSC. He was unresponsive in the post-arrest setting requiring intubation. Transferred to ICU for TTM.  Post rewarming, patient has had no improvement in mental status with MRI consistent with hypoxic encephalopathy.   S/p tracheostomy/PEG tube placement 06/24/2019  Past Medical History  AAA, prior CVA, HTN, HL, MG, RLS, PD, OSA (did not tolerate CPAP), gait disorder   Significant Hospital Events   11/2 >>  admitted to ICU 11/3  >>Bradycardic arrest from mucus plugging.  11/4 >> extubated 11/5 >> transferred to tele/ TRH 11/6 >> PEA Cardiac Arrest, ROSC after 12 minutes >> began TTM to 36 11/7 >> Re-warming 11/7 completed at noon  11/20 >> trach, PEG by surgery. Had bleeding post procedure 11/24 >> Transfer to hospitalist service  Consults:  Vascular surgery  PMT  General surgery  Procedures:  ETT 11/3 >> 11/4, 11/6 >> CVC LIJ 11/06 >> 11/11 Right Radial Aline 11/06 >> 11/9 PEG tube placement 11/20 Tracheostomy 11/20  Significant Diagnostic Tests:  CT head 11/2 >> Stable white matter disease.  MRI brain 11/9 >> Restricted diffusion in the occipital lobes bilaterally and in the frontal parietal lobes over the convexity bilaterally compatible with  acute or subacute infarction. Atrophy and extensive chronic ischemic change in the brain. Multiple areas of chronic microhemorrhage which have progressed since 2017.  Micro Data:  Blood cultures 11/2>> negative   SARS Cov2 11/2 >> negative. Blood cultures 11/6 >> negative  Urine culture 11/6 >> negative Tracheal aspirate 11/6 >> negative  Antimicrobials:  Cefepime 11/6 >> 11/10 Vancomycin 11/6 >> 11/10  Interim history/subjective:  12/4: attempted PS today without success. Suspect pt will need 24/7 vent support. Cont to await dispo planning.  12/2: Last seen by [facility] 11/29, Family requesting SNF discharge.  Medicaid paper work started and may take up to 90 days.  Tolerated PSV from 12p-8p yesterday. Placed on rate overnight.  12/8: tolerating 5/5 then progressively 0/5  Objective   Blood pressure 129/74, pulse 86, temperature 98.4 F (36.9 C), temperature source Oral, resp. rate (!) 22, height 6' (1.829 m), weight 81.2 kg, SpO2 100 %.    Vent Mode: PSV;CPAP FiO2 (%):  [30 %] 30 % Set Rate:  [16 bmp] 16 bmp Vt Set:  [620 mL] 620 mL PEEP:  [5 cmH20] 5 cmH20 Pressure Support:  [5 cmH20-8 cmH20] 5 cmH20 Plateau Pressure:  [15 cmH20-17 cmH20] 16 cmH20   Intake/Output Summary (Last 24 hours) at 07/12/2019 0903 Last data filed at 07/12/2019 0600 Gross per 24 hour  Intake 1268 ml  Output 3270 ml  Net -2002 ml   Filed Weights   07/10/19 0200 07/11/19 0200 07/12/19 0500  Weight: 80.7 kg 80.4 kg 81.2 kg   Examination:  Gen:  Chronically ill appearing male, on vent, unresponsive HEENT: [place]/AT, PERRL, EOM-I and MMM, trach in good position Lungs:   Coarse BS diffusely CV:  RRR, Nl S1/S2 and -M/R/G Abd:  Soft, NT, ND and +BS Ext: -edema and -tenderness Skin:  Warm and dry, no rash or wounds Neuro: Gag intact but not much else  I reviewed CXR myself, trach is in a good position  Discussed with RT and RN  Assessment & Plan:   Acute hypoxemic respiratory failure s/p trach  Obstructive sleep apnea, noncompliant with treatment at baseline Anoxic encephalopathy.   - Trach care per protocol - Maintain even fluid balance at this point - Trach placed 11/20. Suture removal 12/2 - Attempt TC briefly today - Will need facility placement that can place on vent given current neuro and respiratory condition  Best practice:  Diet: Tube feeds at goal Pain/Anxiety/Delirium protocol (if indicated): Home meds VAP protocol (if indicated): In place DVT prophylaxis: Subcu heparin-held since 19th, scd per primary GI prophylaxis: Continue PPI Glucose control: SSI Mobility: BR Code Status: full code  Family Communication: Per primary Disposition: ICU, awaiting placement  Rush Farmer, M.D. [facility]monary[facility] Medicine.

## 2019-07-12 NOTE — TOC Progression Note (Signed)
Transition of Care [facility]) - Progression Note    Patient Details  Name: Derek Harrell MRN: [id number] Date of Birth: 05-Sep-1945  [facility] CM/SW Contact  Bartholomew Crews, RN Phone Number:  [phone] 07/12/2019, 2:40 PM  Clinical Narrative:    [facility] team continuing to follow for transition needs. Noted progress with weaning on vent, and 2 hours on trach collar.    Expected Discharge Plan: Long Term Nursing Home Barriers to Discharge: Continued Medical Work up  Expected Discharge Plan and Services Expected Discharge Plan: [place] In-house Referral: Clinical Social Work Discharge Planning Services: CM Consult   Living arrangements for the past 2 months: Max Determinants of Health (SDOH) Interventions    Readmission Risk Interventions Readmission Risk Prevention Plan 06/03/2019  Transportation Screening Complete  PCP or Specialist Appt within 3-5 Days Complete  HRI or [place] Complete  Social Work Consult for [place] Planning/Counseling Complete  Palliative Care Screening Not Applicable  Medication Review [employer]) Complete  Some recent data might be hidden

## 2019-07-12 NOTE — Progress Notes (Signed)
Pt tolerated trach collar  trial x 2 hours without difficulty.  HR  92, o2 sat 100%, respirations 24 even and unlabored.  Pt placed back on vent in CPAP/PSV mode at this time.  Will continue to monitor and extend weaning trials as patient tolerates.

## 2019-07-13 ENCOUNTER — Telehealth: Payer: Self-pay | Admitting: Neurology

## 2019-07-13 LAB — GLUCOSE, CAPILLARY
Glucose-Capillary: 123 mg/dL — ABNORMAL HIGH (ref 70–99)
Glucose-Capillary: 109 mg/dL — ABNORMAL HIGH (ref 70–99)
Glucose-Capillary: 114 mg/dL — ABNORMAL HIGH (ref 70–99)
Glucose-Capillary: 148 mg/dL — ABNORMAL HIGH (ref 70–99)
Glucose-Capillary: 149 mg/dL — ABNORMAL HIGH (ref 70–99)

## 2019-07-13 MED ORDER — PRO-STAT SUGAR FREE PO LIQD
30.0000 mL | Freq: Two times a day (BID) | ORAL | Status: DC
  Administered 2019-07-13 – 2019-07-25 (×24): 30 mL
  Filled 2019-07-13 (×24): qty 30

## 2019-07-13 MED ORDER — JUVEN PO PACK
1.0000 | PACK | Freq: Two times a day (BID) | ORAL | Status: DC
  Administered 2019-07-13 – 2019-07-25 (×25): 1
  Filled 2019-07-13 (×26): qty 1

## 2019-07-13 MED ORDER — CHLORHEXIDINE GLUCONATE 0.12 % MT SOLN
OROMUCOSAL | Status: AC
  Filled 2019-07-13: qty 15

## 2019-07-13 MED ORDER — ENOXAPARIN SODIUM 40 MG/0.4ML ~~LOC~~ SOLN
40.0000 mg | SUBCUTANEOUS | Status: DC
  Administered 2019-07-13 – 2019-07-25 (×13): 40 mg via SUBCUTANEOUS
  Filled 2019-07-13 (×13): qty 0.4

## 2019-07-13 NOTE — Telephone Encounter (Signed)
Wife is aware

## 2019-07-13 NOTE — Telephone Encounter (Signed)
No, he doesn't.  Looks like intubated and unresponsive.

## 2019-07-13 NOTE — Telephone Encounter (Signed)
Calling in to find out if patient still needs to take the nuplazid. Patient is still in the hospital and she said she got a call for the refill on the medication but was instructed to ask Dr. Carles Collet if the patient even needs to still take it. Please call her back. Thanks!

## 2019-07-13 NOTE — Progress Notes (Signed)
 Nutrition Follow-up  DOCUMENTATION CODES:   Not applicable  INTERVENTION:   Tube feeding:  Bolus feedings of Osmolite 1.5 474 mL (2cans) TID Pro-Stat 30 mL BID Provides 2330 kcals, 119 g of protein and 1086 mL of free water   Add Juven BID, each packet provides 80 calories, 8 grams of carbohydrate, 2.5  grams of protein (collagen), 7 grams of L-arginine and 7 grams of L-glutamine; supplement contains CaHMB, Vitamins C, E, B12 and Zinc to promote wound healing   NUTRITION DIAGNOSIS:   Inadequate oral intake related to inability to eat as evidenced by NPO status.  Being addressed via TF   GOAL:   Patient will meet greater than or equal to 90% of their needs  Progressing  MONITOR:   Vent status, Labs, Weight trends, TF tolerance, Skin, I & O's, Other (Comment)(GOC)  REASON FOR ASSESSMENT:   Ventilator, Consult Enteral/tube feeding initiation and management  ASSESSMENT:   73 yo male admitted with worsening weakness, MS change, HTN, fever. PMH includes HTN, AAA, HLD, CVD, obesity, stroke, myasthenia gravis, RLS, Parkinson's disease, dementia.  11/02 - admit 11/03 - Cortrak placed, tip gastric per Cortrak team 11/04 - extubated 11/06 - cardiac arrest, reintubated, TTM 36 11/20 - Trach and PEG placed  Pt tolerating trach collar trials; still requiring vent support  Tolerating Osmolite 1.5 boluses via G-tube  New unstageable sacral wound noted  Recent weights have been stable  Labs: reviewed Meds: ss novolog   Diet Order:   Diet Order            Diet NPO time specified  Diet effective now              EDUCATION NEEDS:   No education needs have been identified at this time  Skin:  Skin Assessment: Skin Integrity Issues: Skin Integrity Issues:: Unstageable Stage II: buttocks Unstageable: sacrum Incisions: groin  Last BM:  12/9  Height:   Ht Readings from Last 1 Encounters:  06/06/19 6' (1.829 m)    Weight:   Wt Readings from Last 1  Encounters:  07/13/19 81.5 kg    Ideal Body Weight:  80.9 kg  BMI:  Body mass index is 24.37 kg/m.  Estimated Nutritional Needs:   Kcal:  2200-2400 kcals  Protein:  110-130 g  Fluid:  >/= 2 L     Mark Benecke MS, RDN, LDN, CNSC [phone] Pager  [phone] Weekend/On-Call Pager

## 2019-07-13 NOTE — Progress Notes (Addendum)
PROGRESS NOTE    Derek Harrell  [id number] DOB: 05/29/1946 DOA: 06/06/2019 PCP: [name], MD   Brief Narrative: 11yom with Parkinson's dementia, prior CVA, hypertension, hyperlipidemia, RLS, OSA( did not tolerate CPA), gait disorder , AAA that was repaired with abdominal aortic endovascular stent graft 06/02/19 and 3 days psot op came to ER with fever, generalized weakness, altered mental status, hypotension, was admitted to ICU, had bradycardic arrest from mucous plugging on 11/3 intubated subsequently extubated 11/4- transferred to telemetry and sustained PEA cardiac arrest on 11/6 with ROSC then was placed on hypothermia protocol subsequently underwent trach and PEG placement 11/20.  11/2 >>  admitted to ICU 11/3  >>Bradycardic arrest from mucus plugging.  11/4 >> extubated 11/5 >> transferred to tele/ TRH 11/6 >> PEA Cardiac Arrest, ROSC after 12 minutes >> began TTM to 36 11/7 >> Re-warming 11/7 completed at noon  11/20 >> trach, PEG by surgery. Had bleeding post procedure 11/24 >> Transfer to hospitalist service  Subjective: Patient is unresponsive on vent. On weaning trial in ICU No family at bedside.  Seen by palliative care today.  Assessment & Plan:  Anoxic encephalopathy: Status post trach, PEG currently on ventilator being managed by PCCM.cont on trach collar and vent support and wean as tolerated-with current  mental status decannulation unlikely.  Chest x-ray last was okay.  Continue management of secretion pulmonary support bronchodilators.  Continue secretion management with Robinul.  MRI consistent with hypoxic encephalopathy. Overall very poor prognosis.  Ventilator dependent with Acute Hypoxic resp failure: Continue vent as above weaning as per PCCM.  Looking into LTAC- palliative care consultation to address GOC  Intermittent fever: suspect in the setting of anoxic encephalopathy. Chest x-ray 12/6 no pneumonia.  No respiratory changes. Monitor temperature  curve.  Patient at risk for pneumonia/further infection/bed sores.  Monitor check CBC in the morning.  Bradycardic arrest 11/3, cardiac arrest 11/6 overall poor prognosis. Nutrition continue PEG tube feeding 3 times a Nehme, prostat, 200 mL free water flush every 8 hours. Hypertensive emergency: Blood pressure controlled on amlodipine. Unstageable 5 x 8 x 3 cm wound wound care following. Abdominal aortic aneurysm S/P repair 10/29 by vascular.  On Crestor. Dementia: Currently with anoxic encephalopathy, unresponsive OSA-non complaint with treatment at baseline. Anemia of chronic disease: Hb stable 8 to 9 g. Check routine labs in the morning.  Nutrition: Nutrition Problem: Inadequate oral intake Etiology: inability to eat Signs/Symptoms: NPO status Interventions: Tube feeding, Prostat  Pressure Ulcer: Pressure Injury 06/06/19 Sacrum Mid;Left Unstageable - Full thickness tissue loss in which the base of the ulcer is covered by slough (yellow, tan, gray, green or brown) and/or eschar (tan, brown or black) in the wound bed. unstageable when assessed on 1 (Active)  06/06/19 2032  Location: Sacrum  Location Orientation: Mid;Left  Staging: Unstageable - Full thickness tissue loss in which the base of the ulcer is covered by slough (yellow, tan, gray, green or brown) and/or eschar (tan, brown or black) in the wound bed.  Wound Description (Comments): unstageable when assessed on 12/7  Present on Admission: Yes     Pressure Injury 07/07/19 Neck Mid Stage II -  Partial thickness loss of dermis presenting as a shallow open ulcer with a red, pink wound bed without slough. yellow shallow penny sized (Active)  07/07/19 1200  Location: Neck  Location Orientation: Mid  Staging: Stage II -  Partial thickness loss of dermis presenting as a shallow open ulcer with a red, pink wound bed without slough.  Wound Description (Comments): yellow shallow penny sized  Present on Admission:     Body mass index  is 24.37 kg/m.   Goals of care: palliative care consulted, prognosis poor.  DVT prophylaxis: SCD/lovenox.  High risk for DVT. Code Status: FULL CODE Family Communication: plan of care discussed with nursing staff.  No family at bedside.  Disposition Plan: Remains inpatient pending clinical improvement and further  Disposition to LTAC.   Consultants: PCCM,palliative care.  Procedures:  CT head 11/2 >> Stable white matter disease.  MRI brain 11/9 >> Restricted diffusion in the occipital lobes bilaterally and in the frontal parietal lobes over the convexity bilaterally compatible with acute or subacute infarction. Atrophy and extensive chronic ischemic change in the brain. Multiple areas of chronic microhemorrhage which have progressed since 2017. Microbiology: Blood cultures 11/2>> negative   SARS Cov2 11/2 >> negative. Blood cultures 11/6 >> negative  Urine culture 11/6 >> negative Tracheal aspirate 11/6 >> negative  Antimicrobials: Anti-infectives (From admission, onward)   Start     Dose/Rate Route Frequency Ordered Stop   06/14/19 0600  vancomycin (VANCOCIN) 1,500 mg in sodium chloride 0.9 % 500 mL IVPB  Status:  Discontinued     1,500 mg 250 mL/hr over 120 Minutes Intravenous Every 24 hours 06/13/19 1207 06/13/19 1220   06/13/19 1800  vancomycin (VANCOCIN) 1,500 mg in sodium chloride 0.9 % 500 mL IVPB  Status:  Discontinued     1,500 mg 250 mL/hr over 120 Minutes Intravenous Every 24 hours 06/13/19 1220 06/14/19 0944   06/10/19 0615  vancomycin (VANCOCIN) IVPB 1000 mg/200 mL premix  Status:  Discontinued     1,000 mg 200 mL/hr over 60 Minutes Intravenous Every 12 hours 06/10/19 0605 06/13/19 1207   06/10/19 0615  ceFEPIme (MAXIPIME) 2 g in sodium chloride 0.9 % 100 mL IVPB  Status:  Discontinued     2 g 200 mL/hr over 30 Minutes Intravenous Every 8 hours 06/10/19 0605 06/14/19 0944       Objective: Vitals:   07/13/19 1300 07/13/19 1400 07/13/19 1532 07/13/19 1536   BP: (!) 154/92 132/80  128/82  Pulse: (!) 46 (!) 48  98  Resp: (!) 26 (!) 25    Temp:   (!) 101 F (38.3 C)   TempSrc:   Oral   SpO2: 100% 100%    Weight:      Height:        Intake/Output Summary (Last 24 hours) at 07/13/2019 1551 Last data filed at 07/13/2019 1200 Gross per 24 hour  Intake 1497 ml  Output 2431 ml  Net -934 ml   Filed Weights   07/11/19 0200 07/12/19 0500 07/13/19 0500  Weight: 80.4 kg 81.2 kg 81.5 kg   Weight change: 0.3 kg  Body mass index is 24.37 kg/m.  Intake/Output from previous [place]: 12/08 0701 - 12/09 0700 In: 1667 [NG/GT:1622] Out: 2606 [Urine:2605; Stool:1] Intake/Output this shift: Total I/O In: 978 [Other:30; NG/GT:948] Out: 600 [Urine:600]  Examination:  General exam: Unresponsive on ventilator, chronically ill-appearing HEENT:Oral mucosa moist, Ear/Nose WNL grossly, PUPIL sluggish to react Respiratory system: gag reflex present, bilaterally coarse breath sounds on ventilator.   Tach + Cardiovascular system: S1 & S2 +, No JVD,. Gastrointestinal system: Abdomen soft, PEG+, BS+ Nervous System: Unresponsive, pupils sluggish to react, gag reflex present  Extremities: No edema, distal peripheral pulses palpable.  Skin: No rashes,no icterus. WJX:BJYN muscle bulk. Medications:  Scheduled Meds: . amLODipine  10 mg Per Tube Daily  . Carbidopa-Levodopa ER  1 tablet Per Tube 5 X Daily  . chlorhexidine gluconate (MEDLINE KIT)  15 mL Mouth Rinse BID  . Chlorhexidine Gluconate Cloth  6 each Topical Daily  . collagenase   Topical Daily  . docusate  50 mg Per Tube Daily  . enoxaparin (LOVENOX) injection  40 mg Subcutaneous Q24H  . feeding supplement (OSMOLITE 1.5 CAL)  474 mL Per Tube TID  . feeding supplement (PRO-STAT SUGAR FREE 64)  30 mL Per Tube BID  . free water  200 mL Per Tube Q8H  . insulin aspart  0-15 Units Subcutaneous TID WC  . mouth rinse  15 mL Mouth Rinse 10 times per Djordjevic  . nutrition supplement (JUVEN)  1 packet Per Tube BID  BM  . pantoprazole sodium  40 mg Per Tube Daily  . Pimavanserin Tartrate  34 mg Per Tube Daily  . rosuvastatin  10 mg Per Tube q1800   Continuous Infusions: . sodium chloride Stopped (06/10/19 0000)  . sodium chloride Stopped (06/21/19 1744)    Data Reviewed: I have personally reviewed following labs and imaging studies  CBC: Recent Labs  Lab 07/08/19 0341 07/11/19 0257 07/12/19 0320  WBC 11.5* 9.9 10.3  NEUTROABS  --  7.0 7.9*  HGB 8.7* 8.6* 8.9*  HCT 27.3* 26.5* 28.2*  MCV 85.8 82.6 83.2  PLT 144* 163 309   Basic Metabolic Panel: Recent Labs  Lab 07/08/19 0341 07/09/19 0824 07/11/19 0257 07/12/19 0320  NA 140 137 139 139  K 4.7 4.3 4.7 4.3  CL 104 104 103 104  CO2 '25 25 26 26  '$ GLUCOSE 121* 117* 119* 120*  BUN 53* 52* 54* 52*  CREATININE 0.89 0.79 0.85 0.90  CALCIUM 8.6* 8.9 8.7* 8.9  MG  --  2.1  --   --   PHOS  --  4.3  --   --    GFR: Estimated Creatinine Clearance: 82.6 mL/min (by C-G formula based on SCr of 0.9 mg/dL). Liver Function Tests: Recent Labs  Lab 07/09/19 0824 07/11/19 0257 07/12/19 0320  AST 35 35 33  ALT '6 11 8  '$ ALKPHOS 85 88 77  BILITOT 0.8 0.8 0.6  PROT 6.7 6.4* 6.9  ALBUMIN 2.4* 2.1* 2.3*   No results for input(s): LIPASE, AMYLASE in the last 168 hours. No results for input(s): AMMONIA in the last 168 hours. Coagulation Profile: No results for input(s): INR, PROTIME in the last 168 hours. Cardiac Enzymes: No results for input(s): CKTOTAL, CKMB, CKMBINDEX, TROPONINI in the last 168 hours. BNP (last 3 results) No results for input(s): PROBNP in the last 8760 hours. HbA1C: No results for input(s): HGBA1C in the last 72 hours. CBG: Recent Labs  Lab 07/12/19 2351 07/13/19 0443 07/13/19 0719 07/13/19 1127 07/13/19 1525  GLUCAP 130* 123* 109* 114* 148*   Lipid Profile: No results for input(s): CHOL, HDL, LDLCALC, TRIG, CHOLHDL, LDLDIRECT in the last 72 hours. Thyroid Function Tests: No results for input(s): TSH, T4TOTAL,  FREET4, T3FREE, THYROIDAB in the last 72 hours. Anemia Panel: No results for input(s): VITAMINB12, FOLATE, FERRITIN, TIBC, IRON, RETICCTPCT in the last 72 hours. Sepsis Labs: No results for input(s): PROCALCITON, LATICACIDVEN in the last 168 hours.  No results found for this or any previous visit (from the past 240 hour(s)).    Radiology Studies: No results found.    LOS: 37 days   Time spent: More than 50% of that time was spent in counseling and/or coordination of care.  Antonieta Pert, MD [facility]  07/13/2019, 3:51 PM

## 2019-07-14 LAB — BASIC METABOLIC PANEL
Anion gap: 11 (ref 5–15)
BUN: 60 mg/dL — ABNORMAL HIGH (ref 8–23)
CO2: 25 mmol/L (ref 22–32)
Calcium: 8.6 mg/dL — ABNORMAL LOW (ref 8.9–10.3)
Chloride: 106 mmol/L (ref 98–111)
Creatinine, Ser: 0.91 mg/dL (ref 0.61–1.24)
GFR calc Af Amer: >60 mL/min (ref >60)
GFR calc non Af Amer: >60 mL/min (ref >60)
Glucose, Bld: 109 mg/dL — ABNORMAL HIGH (ref 70–99)
Potassium: 4.5 mmol/L (ref 3.5–5.1)
Sodium: 142 mmol/L (ref 135–145)

## 2019-07-14 LAB — CBC
HCT: 29.4 % — ABNORMAL LOW (ref 39.0–52.0)
Hemoglobin: 9.1 g/dL — ABNORMAL LOW (ref 13.0–17.0)
MCH: 26.7 pg (ref 26.0–34.0)
MCHC: 31 g/dL (ref 30.0–36.0)
MCV: 86.2 fL (ref 80.0–100.0)
Platelets: 220 10*3/uL (ref 150–400)
RBC: 3.41 MIL/uL — ABNORMAL LOW (ref 4.22–5.81)
RDW: 14.9 % (ref 11.5–15.5)
WBC: 14 10*3/uL — ABNORMAL HIGH (ref 4.0–10.5)
nRBC: 0 % (ref 0.0–0.2)

## 2019-07-14 LAB — GLUCOSE, CAPILLARY
Glucose-Capillary: 108 mg/dL — ABNORMAL HIGH (ref 70–99)
Glucose-Capillary: 123 mg/dL — ABNORMAL HIGH (ref 70–99)
Glucose-Capillary: 103 mg/dL — ABNORMAL HIGH (ref 70–99)

## 2019-07-14 NOTE — Progress Notes (Signed)
PROGRESS NOTE    Derek Harrell  FWY:[id number] DOB: [birth year] DOA: 06/06/2019 PCP: [name], MD   Brief Narrative: 25yom with Parkinson's dementia, prior CVA, hypertension, hyperlipidemia, RLS, OSA( did not tolerate CPA), gait disorder , AAA that was repaired with abdominal aortic endovascular stent graft 06/02/19 and 3 days psot op came to ER with fever, generalized weakness, altered mental status, hypotension, was admitted to ICU, had bradycardic arrest from mucous plugging on 11/3 intubated subsequently extubated 11/4- transferred to telemetry and sustained PEA cardiac arrest on 11/6 with ROSC then was placed on hypothermia protocol subsequently underwent trach and PEG placement 11/20.  11/2 >>  admitted to ICU 11/3  >>Bradycardic arrest from mucus plugging.  11/4 >> extubated 11/5 >> transferred to tele/ TRH 11/6 >> PEA Cardiac Arrest, ROSC after 12 minutes >> began TTM to 36 11/7 >> Re-warming 11/7 completed at noon  11/20 >> trach, PEG by surgery. Had bleeding post procedure 11/24 >> Transfer to hospitalist service  Subjective: Patient seen.  Eyes are open, not responding, pupils sluggish On trach collar weaning this morning.  Appears tachypneic.  No family at bedside. No fever episode no acute events overnight discussed the nursing staff  Assessment & Plan:  Anoxic encephalopathy: Status post trach, PEG currently on ventilator being managed by PCCM.cont on trach collar and vent support and wean as tolerated-with current  mental status decannulation unlikely.  Chest x-ray last was okay.  Continue management of secretion pulmonary support bronchodilators.  Continue secretion management with Robinul.  MRI consistent with hypoxic encephalopathy. Overall very poor prognosis.  Ventilator dependent with Acute Hypoxic resp failure: Continue vent as above weaning as per PCCM.  This morning on trach collar weaning.  Looking into LTAC- palliative care consultation to address GOC   Intermittent fever: suspect in the setting of anoxic encephalopathy. Chest x-ray 12/6 no pneumonia.  No respiratory changes. No fever  Spikes since yesterday.  Monitor temperature curve.  Patient at risk for pneumonia/further infection/bed sores. Cbc up at 14k monitor.  No evidence of obvious infection.    Bradycardic arrest 11/3, cardiac arrest 11/6 overall poor prognosis. Nutrition continue PEG tube feeding 3 times a [place], prostat, 200 mL free water flush every 8 hours. Hypertensive emergency: Blood pressure on the low 10 of amlodipine.  Monitor and may need to cut back dose if less than 277 systolic Unstageable 5 x 8 x 3 cm wound wound care following. Abdominal aortic aneurysm S/P repair 10/29 by vascular.  On Crestor. Dementia: Currently with anoxic encephalopathy, unresponsive OSA-non complaint with treatment at baseline. Anemia of chronic disease: Hb stable 8 to 9 g. Check routine labs in the morning.  Nutrition: Nutrition Problem: Inadequate oral intake Etiology: inability to eat Signs/Symptoms: NPO status Interventions: Tube feeding, Prostat  Pressure Ulcer: Pressure Injury 06/06/19 Sacrum Mid;Left Unstageable - Full thickness tissue loss in which the base of the ulcer is covered by slough (yellow, tan, gray, green or brown) and/or eschar (tan, brown or black) in the wound bed. unstageable when assessed on 1 (Active)  06/06/19 2032  Location: Sacrum  Location Orientation: Mid;Left  Staging: Unstageable - Full thickness tissue loss in which the base of the ulcer is covered by slough (yellow, tan, gray, green or brown) and/or eschar (tan, brown or black) in the wound bed.  Wound Description (Comments): unstageable when assessed on 12/7  Present on Admission: Yes     Pressure Injury 07/07/19 Neck Mid Stage II -  Partial thickness loss of dermis presenting as  a shallow open ulcer with a red, pink wound bed without slough. yellow shallow penny sized (Active)  07/07/19 1200  Location:  Neck  Location Orientation: Mid  Staging: Stage II -  Partial thickness loss of dermis presenting as a shallow open ulcer with a red, pink wound bed without slough.  Wound Description (Comments): yellow shallow penny sized  Present on Admission:     Body mass index is 23.32 kg/m.   Goals of care: palliative care consulted- saw yesterday.Prognosis poor.  DVT prophylaxis: SCD/lovenox.  High risk for DVT. Code Status: FULL CODE Family Communication: Discussed with the nursing staff.No family at bedside.  Disposition Plan: Remains inpatient pending clinical improvement and further  Disposition to LTAC/SNF with vent.  Consultants: PCCM,palliative care. Procedures:  CT head 11/2 >> Stable white matter disease.  MRI brain 11/9 >> Restricted diffusion in the occipital lobes bilaterally and in the frontal parietal lobes over the convexity bilaterally compatible with acute or subacute infarction. Atrophy and extensive chronic ischemic change in the brain. Multiple areas of chronic microhemorrhage which have progressed since 2017. Microbiology: Blood cultures 11/2>> negative   SARS Cov2 11/2 >> negative. Blood cultures 11/6 >> negative  Urine culture 11/6 >> negative Tracheal aspirate 11/6 >> negative  Antimicrobials: Anti-infectives (From admission, onward)   Start     Dose/Rate Route Frequency Ordered Stop   06/14/19 0600  vancomycin (VANCOCIN) 1,500 mg in sodium chloride 0.9 % 500 mL IVPB  Status:  Discontinued     1,500 mg 250 mL/hr over 120 Minutes Intravenous Every 24 hours 06/13/19 1207 06/13/19 1220   06/13/19 1800  vancomycin (VANCOCIN) 1,500 mg in sodium chloride 0.9 % 500 mL IVPB  Status:  Discontinued     1,500 mg 250 mL/hr over 120 Minutes Intravenous Every 24 hours 06/13/19 1220 06/14/19 0944   06/10/19 0615  vancomycin (VANCOCIN) IVPB 1000 mg/200 mL premix  Status:  Discontinued     1,000 mg 200 mL/hr over 60 Minutes Intravenous Every 12 hours 06/10/19 0605 06/13/19 1207    06/10/19 0615  ceFEPIme (MAXIPIME) 2 g in sodium chloride 0.9 % 100 mL IVPB  Status:  Discontinued     2 g 200 mL/hr over 30 Minutes Intravenous Every 8 hours 06/10/19 0605 06/14/19 0944       Objective: Vitals:   07/14/19 0817 07/14/19 0900 07/14/19 1000 07/14/19 1100  BP: (!) 109/53 (!) 139/54 (!) 123/48 126/77  Pulse:  (!) 44 (!) 50 (!) 44  Resp:  (!) 22 (!) 24 (!) 22  Temp:      TempSrc:      SpO2:  100% 100% 100%  Weight:      Height:        Intake/Output Summary (Last 24 hours) at 07/14/2019 1143 Last data filed at 07/14/2019 0900 Gross per 24 hour  Intake 1717 ml  Output 1950 ml  Net -233 ml   Filed Weights   07/12/19 0500 07/13/19 0500 07/14/19 0253  Weight: 81.2 kg 81.5 kg 78 kg   Weight change: -3.5 kg  Body mass index is 23.32 kg/m.  Intake/Output from previous [place]: 12/09 0701 - 12/10 0700 In: 1747 [NG/GT:1622] Out: 1800 [Urine:1800] Intake/Output this shift: Total I/O In: 474 [NG/GT:474] Out: 150 [Urine:150]  Examination:  General exam: Eyes open but does not respond, on trach collar, chronically ill-appearing HEENT:Oral mucosa moist, Ear/Nose WNL grossly, PUPIL sluggish to react Respiratory system: gag reflex present, bilaterally coarse breath sounds on wean this am-Tach + Cardiovascular system: S1 &  S2 +, No JVD,. Gastrointestinal system: Abdomen soft, PEG+, BS+ Nervous System: Unresponsive, pupils sluggish.  Spontaneously breathing on the weaning trial Extremities: No edema, distal peripheral pulses palpable.  Skin: No rashes,no icterus. JSH:FWYO muscle bulk. Medications:  Scheduled Meds: . amLODipine  10 mg Per Tube Daily  . Carbidopa-Levodopa ER  1 tablet Per Tube 5 X Daily  . chlorhexidine gluconate (MEDLINE KIT)  15 mL Mouth Rinse BID  . Chlorhexidine Gluconate Cloth  6 each Topical Daily  . collagenase   Topical Daily  . docusate  50 mg Per Tube Daily  . enoxaparin (LOVENOX) injection  40 mg Subcutaneous Q24H  . feeding  supplement (OSMOLITE 1.5 CAL)  474 mL Per Tube TID  . feeding supplement (PRO-STAT SUGAR FREE 64)  30 mL Per Tube BID  . free water  200 mL Per Tube Q8H  . insulin aspart  0-15 Units Subcutaneous TID WC  . mouth rinse  15 mL Mouth Rinse 10 times per [place]  . nutrition supplement (JUVEN)  1 packet Per Tube BID BM  . pantoprazole sodium  40 mg Per Tube Daily  . Pimavanserin Tartrate  34 mg Per Tube Daily  . rosuvastatin  10 mg Per Tube q1800   Continuous Infusions: . sodium chloride Stopped (06/10/19 0000)  . sodium chloride Stopped (06/21/19 1744)    Data Reviewed: I have personally reviewed following labs and imaging studies  CBC: Recent Labs  Lab 07/08/19 0341 07/11/19 0257 07/12/19 0320 07/14/19 0403  WBC 11.5* 9.9 10.3 14.0*  NEUTROABS  --  7.0 7.9*  --   HGB 8.7* 8.6* 8.9* 9.1*  HCT 27.3* 26.5* 28.2* 29.4*  MCV 85.8 82.6 83.2 86.2  PLT 144* 163 192 378   Basic Metabolic Panel: Recent Labs  Lab 07/08/19 0341 07/09/19 0824 07/11/19 0257 07/12/19 0320 07/14/19 0704  NA 140 137 139 139 142  K 4.7 4.3 4.7 4.3 4.5  CL 104 104 103 104 106  CO2 '25 25 26 26 25  '$ GLUCOSE 121* 117* 119* 120* 109*  BUN 53* 52* 54* 52* 60*  CREATININE 0.89 0.79 0.85 0.90 0.91  CALCIUM 8.6* 8.9 8.7* 8.9 8.6*  MG  --  2.1  --   --   --   PHOS  --  4.3  --   --   --    GFR: Estimated Creatinine Clearance: 81.7 mL/min (by C-G formula based on SCr of 0.91 mg/dL). Liver Function Tests: Recent Labs  Lab 07/09/19 0824 07/11/19 0257 07/12/19 0320  AST 35 35 33  ALT '6 11 8  '$ ALKPHOS 85 88 77  BILITOT 0.8 0.8 0.6  PROT 6.7 6.4* 6.9  ALBUMIN 2.4* 2.1* 2.3*   No results for input(s): LIPASE, AMYLASE in the last 168 hours. No results for input(s): AMMONIA in the last 168 hours. Coagulation Profile: No results for input(s): INR, PROTIME in the last 168 hours. Cardiac Enzymes: No results for input(s): CKTOTAL, CKMB, CKMBINDEX, TROPONINI in the last 168 hours. BNP (last 3 results) No results  for input(s): PROBNP in the last 8760 hours. HbA1C: No results for input(s): HGBA1C in the last 72 hours. CBG: Recent Labs  Lab 07/13/19 1127 07/13/19 1525 07/13/19 2135 07/14/19 0747 07/14/19 1138  GLUCAP 114* 148* 149* 108* 123*   Lipid Profile: No results for input(s): CHOL, HDL, LDLCALC, TRIG, CHOLHDL, LDLDIRECT in the last 72 hours. Thyroid Function Tests: No results for input(s): TSH, T4TOTAL, FREET4, T3FREE, THYROIDAB in the last 72 hours. Anemia Panel: No results  for input(s): VITAMINB12, FOLATE, FERRITIN, TIBC, IRON, RETICCTPCT in the last 72 hours. Sepsis Labs: No results for input(s): PROCALCITON, LATICACIDVEN in the last 168 hours.  No results found for this or any previous visit (from the past 240 hour(s)).    Radiology Studies: No results found.    LOS: 38 days   Time spent: More than 50% of that time was spent in counseling and/or coordination of care.  Antonieta Pert, MD [facility]  07/14/2019, 11:43 AM

## 2019-07-15 LAB — CBC
HCT: 26.5 % — ABNORMAL LOW (ref 39.0–52.0)
Hemoglobin: 8.4 g/dL — ABNORMAL LOW (ref 13.0–17.0)
MCH: 26.4 pg (ref 26.0–34.0)
MCHC: 31.7 g/dL (ref 30.0–36.0)
MCV: 83.3 fL (ref 80.0–100.0)
Platelets: 255 10*3/uL (ref 150–400)
RBC: 3.18 MIL/uL — ABNORMAL LOW (ref 4.22–5.81)
RDW: 14.7 % (ref 11.5–15.5)
WBC: 11.7 10*3/uL — ABNORMAL HIGH (ref 4.0–10.5)
nRBC: 0 % (ref 0.0–0.2)

## 2019-07-15 LAB — BASIC METABOLIC PANEL
Anion gap: 11 (ref 5–15)
BUN: 71 mg/dL — ABNORMAL HIGH (ref 8–23)
CO2: 27 mmol/L (ref 22–32)
Calcium: 9 mg/dL (ref 8.9–10.3)
Chloride: 106 mmol/L (ref 98–111)
Creatinine, Ser: 0.96 mg/dL (ref 0.61–1.24)
GFR calc Af Amer: >60 mL/min (ref >60)
GFR calc non Af Amer: >60 mL/min (ref >60)
Glucose, Bld: 118 mg/dL — ABNORMAL HIGH (ref 70–99)
Potassium: 4.3 mmol/L (ref 3.5–5.1)
Sodium: 144 mmol/L (ref 135–145)

## 2019-07-15 LAB — MAGNESIUM: Magnesium: 2.4 mg/dL (ref 1.7–2.4)

## 2019-07-15 LAB — GLUCOSE, CAPILLARY
Glucose-Capillary: 100 mg/dL — ABNORMAL HIGH (ref 70–99)
Glucose-Capillary: 112 mg/dL — ABNORMAL HIGH (ref 70–99)
Glucose-Capillary: 178 mg/dL — ABNORMAL HIGH (ref 70–99)
Glucose-Capillary: 96 mg/dL (ref 70–99)
Glucose-Capillary: 98 mg/dL (ref 70–99)

## 2019-07-15 MED ORDER — AMLODIPINE BESYLATE 2.5 MG PO TABS
2.5000 mg | ORAL_TABLET | Freq: Every day | ORAL | Status: DC
  Administered 2019-07-16 – 2019-07-25 (×10): 2.5 mg via JEJUNOSTOMY
  Filled 2019-07-15 (×11): qty 1

## 2019-07-15 MED ORDER — AMLODIPINE BESYLATE 5 MG PO TABS: 2.5000 mg | ORAL_TABLET | Freq: Once | ORAL | Status: DC

## 2019-07-15 NOTE — Progress Notes (Signed)
PROGRESS NOTE    Derek Harrell  KKX:[id number] DOB: 1946-05-11 DOA: 06/06/2019 PCP: [name], MD   Brief Narrative: 47yom with Parkinson's dementia, prior CVA, hypertension, hyperlipidemia, RLS, OSA( did not tolerate CPAP), gait disorder , AAA that was repaired with abdominal aortic endovascular stent graft 06/02/19 and 3 days psot op came to ER with fever, generalized weakness, altered mental status, hypotension, was admitted to ICU, had bradycardic arrest/3 degree heart block from mucous plugging on 11/3 intubated subsequently extubated 11/4- transferred to telemetry and sustained PEA cardiac arrest on 11/6 , RN alerted by VT in tele, CPR with ROSC then was placed on TTM subsequently underwent trach and PEG placement 11/20.  11/2 >>  admitted to ICU 11/3  >>Bradycardic arrest from mucus plugging.  11/4 >> extubated 11/5 >> transferred to tele/ TRH 11/6 >> PEA Cardiac Arrest, ROSC after 12 minutes >> began TTM to 36 11/7 >> Re-warming 11/7 completed at noon  11/20 >> trach, PEG by surgery. Had bleeding post procedure 11/24 >> Transfer to hospitalist service  Subjective: Seen in ICU, on trach collar at 5 L/min  28% weaning this morning. Patient is unresponsive Patient has been soft amlodipine held No fever episode.  WBC improved to 11.7k  Assessment & Plan:  Anoxic encephalopathy: Status post trach, PEG currently on ventilator-on weaning trial tolerated 6-hour 12/10, continue weaning to trach collar per PCCM.  Patient will need placement to skilled nursing facility with vent. Last chest x-ray last was okay.  Patient having secretion, continue suctioning, Robinul, Cont pulmonary support per PCCM. MRI consistent with hypoxic encephalopathy. Overall very poor prognosis.  Ventilator dependent with Acute Hypoxic resp failure: Continue vent as above with weaning trial.    Intermittent fever: no Fever recurrence since 12/9.  suspect in the setting of anoxic encephalopathy. Chest x-ray  12/6 no pneumonia.  Patient does have excessive secretion will need close monitoring and supportive care. Patient at risk for pneumonia/further infection/bed sores. Cbc up at 14k -9 down great at 11.7K. Bradycardic arrest 11/3, cardiac arrest 11/6 overall poor prognosis. Nutrition continue PEG tube feeding 3 times a [place], prostat, 200 mL free water flush every 8 hours. Hypertensive emergency: Blood pressure in 90s to 100s. On 10 of amlodipine- switch to 2.5 mg and monitor BP. Unstageable 5 x 8 x 3 cm wound wound care following. Abdominal aortic aneurysm S/P repair 10/29 by vascular.  On Crestor. Dementia: Currently with anoxic encephalopathy, unresponsive OSA-non complaint with treatment at baseline. Anemia of chronic disease: Hb stable 8 to 9 g. Check routine labs in the morning.  Nutrition: Nutrition Problem: Inadequate oral intake Etiology: inability to eat Signs/Symptoms: NPO status Interventions: Tube feeding, Prostat  Pressure Ulcer: Pressure Injury 06/06/19 Sacrum Mid;Left Unstageable - Full thickness tissue loss in which the base of the ulcer is covered by slough (yellow, tan, gray, green or brown) and/or eschar (tan, brown or black) in the wound bed. unstageable when assessed on 1 (Active)  06/06/19 2032  Location: Sacrum  Location Orientation: Mid;Left  Staging: Unstageable - Full thickness tissue loss in which the base of the ulcer is covered by slough (yellow, tan, gray, green or brown) and/or eschar (tan, brown or black) in the wound bed.  Wound Description (Comments): unstageable when assessed on 12/7  Present on Admission: Yes     Pressure Injury 07/07/19 Neck Mid Stage II -  Partial thickness loss of dermis presenting as a shallow open ulcer with a red, pink wound bed without slough. yellow shallow penny sized (  Active)  07/07/19 1200  Location: Neck  Location Orientation: Mid  Staging: Stage II -  Partial thickness loss of dermis presenting as a shallow open ulcer with a  red, pink wound bed without slough.  Wound Description (Comments): yellow shallow penny sized  Present on Admission:     Body mass index is 23.35 kg/m.   Goals of care: palliative care consulted- RN reprots palliative care has come by 12/9  DVT prophylaxis: SCD/lovenox. He is at high risk for DVT. Code Status: FULL CODE Family Communication: Discussed with the nursing staff.No family at bedside.  Disposition Plan: Remains inpatient pending placement to skilled nursing facility with vent support.  Remains in ICU, continue vent weaning trial as per critical care. Discussed with the nurse stop this morning-patient wife has not been adamant about continuing on current full care. Palliative on consult for GOC.  Consultants: PCCM,palliative care. Procedures:  CT head 11/2 >> Stable white matter disease.  MRI brain 11/9 >> Restricted diffusion in the occipital lobes bilaterally and in the frontal parietal lobes over the convexity bilaterally compatible with acute or subacute infarction. Atrophy and extensive chronic ischemic change in the brain. Multiple areas of chronic microhemorrhage which have progressed since 2017. Microbiology: Blood cultures 11/2>> negative   SARS Cov2 11/2 >> negative. Blood cultures 11/6 >> negative  Urine culture 11/6 >> negative Tracheal aspirate 11/6 >> negative  Antimicrobials: Anti-infectives (From admission, onward)   Start     Dose/Rate Route Frequency Ordered Stop   06/14/19 0600  vancomycin (VANCOCIN) 1,500 mg in sodium chloride 0.9 % 500 mL IVPB  Status:  Discontinued     1,500 mg 250 mL/hr over 120 Minutes Intravenous Every 24 hours 06/13/19 1207 06/13/19 1220   06/13/19 1800  vancomycin (VANCOCIN) 1,500 mg in sodium chloride 0.9 % 500 mL IVPB  Status:  Discontinued     1,500 mg 250 mL/hr over 120 Minutes Intravenous Every 24 hours 06/13/19 1220 06/14/19 0944   06/10/19 0615  vancomycin (VANCOCIN) IVPB 1000 mg/200 mL premix  Status:  Discontinued      1,000 mg 200 mL/hr over 60 Minutes Intravenous Every 12 hours 06/10/19 0605 06/13/19 1207   06/10/19 0615  ceFEPIme (MAXIPIME) 2 g in sodium chloride 0.9 % 100 mL IVPB  Status:  Discontinued     2 g 200 mL/hr over 30 Minutes Intravenous Every 8 hours 06/10/19 0605 06/14/19 0944       Objective: Vitals:   07/15/19 0900 07/15/19 1000 07/15/19 1100 07/15/19 1109  BP: (!) 111/45 124/70 122/63 122/63  Pulse: (!) 44 (!) 45 (!) 44 90  Resp: 16 (!) 9 (!) 24 (!) 21  Temp:      TempSrc:      SpO2: 100% 100% 99% 100%  Weight:      Height:        Intake/Output Summary (Last 24 hours) at 07/15/2019 1114 Last data filed at 07/15/2019 0813 Gross per 24 hour  Intake 948 ml  Output 2250 ml  Net -1302 ml   Filed Weights   07/13/19 0500 07/14/19 0253 07/15/19 0500  Weight: 81.5 kg 78 kg 78.1 kg   Weight change: 0.1 kg  Body mass index is 23.35 kg/m.  Intake/Output from previous [place]: 12/10 0701 - 12/11 0700 In: 948 [NG/GT:948] Out: 2100 [Urine:2100] Intake/Output this shift: Total I/O In: 474 [NG/GT:474] Out: 300 [Urine:300]  Examination:  General exam: Unresponsive eyes are closed, on trach collar, chronically ill-appearing HEENT:Oral mucosa moist, Ear/Nose WNL grossly, PUPIL sluggish  to react trach collar-currently on 5 L  o2 weaning. Respiratory system:  BS + B/L with diffuse coarse crackles, Tach + Cardiovascular system: S1 & S2 +, No JVD,. Gastrointestinal system: Abdomen soft, PEG+, BS+ Nervous System: Unresponsive, pupils sluggish. spontaneously breathing on the weaning trial Extremities: No edema, distal peripheral pulses palpable.  Skin: No rashes,no icterus. JSH:FWYO muscle bulk. Medications:  Scheduled Meds: . Carbidopa-Levodopa ER  1 tablet Per Tube 5 X Daily  . chlorhexidine gluconate (MEDLINE KIT)  15 mL Mouth Rinse BID  . Chlorhexidine Gluconate Cloth  6 each Topical Daily  . collagenase   Topical Daily  . docusate  50 mg Per Tube Daily  . enoxaparin  (LOVENOX) injection  40 mg Subcutaneous Q24H  . feeding supplement (OSMOLITE 1.5 CAL)  474 mL Per Tube TID  . feeding supplement (PRO-STAT SUGAR FREE 64)  30 mL Per Tube BID  . free water  200 mL Per Tube Q8H  . insulin aspart  0-15 Units Subcutaneous TID WC  . mouth rinse  15 mL Mouth Rinse 10 times per Floor  . nutrition supplement (JUVEN)  1 packet Per Tube BID BM  . pantoprazole sodium  40 mg Per Tube Daily  . Pimavanserin Tartrate  34 mg Per Tube Daily  . rosuvastatin  10 mg Per Tube q1800   Continuous Infusions: . sodium chloride Stopped (06/10/19 0000)  . sodium chloride Stopped (06/21/19 1744)    Data Reviewed: I have personally reviewed following labs and imaging studies  CBC: Recent Labs  Lab 07/11/19 0257 07/12/19 0320 07/14/19 0403 07/15/19 0610  WBC 9.9 10.3 14.0* 11.7*  NEUTROABS 7.0 7.9*  --   --   HGB 8.6* 8.9* 9.1* 8.4*  HCT 26.5* 28.2* 29.4* 26.5*  MCV 82.6 83.2 86.2 83.3  PLT 163 192 220 378   Basic Metabolic Panel: Recent Labs  Lab 07/09/19 0824 07/11/19 0257 07/12/19 0320 07/14/19 0704 07/15/19 0610  NA 137 139 139 142 144  K 4.3 4.7 4.3 4.5 4.3  CL 104 103 104 106 106  CO2 _0 GLUCOSE 117* 119* 120* 109* 118*  BUN 52* 54* 52* 60* 71*  CREATININE 0.79 0.85 0.90 0.91 0.96  CALCIUM 8.9 8.7* 8.9 8.6* 9.0  MG 2.1  --   --   --  2.4  PHOS 4.3  --   --   --   --    GFR: Estimated Creatinine Clearance: 77.5 mL/min (by C-G formula based on SCr of 0.96 mg/dL). Liver Function Tests: Recent Labs  Lab 07/09/19 0824 07/11/19 0257 07/12/19 0320  AST 35 35 33  ALT _1 ALKPHOS 85 88 77  BILITOT 0.8 0.8 0.6  PROT 6.7 6.4* 6.9  ALBUMIN 2.4* 2.1* 2.3*   No results for input(s): LIPASE, AMYLASE in the last 168 hours. No results for input(s): AMMONIA in the last 168 hours. Coagulation Profile: No results for input(s): INR, PROTIME in the last 168 hours. Cardiac Enzymes: No results for input(s): CKTOTAL, CKMB, CKMBINDEX, TROPONINI in  the last 168 hours. BNP (last 3 results) No results for input(s): PROBNP in the last 8760 hours. HbA1C: No results for input(s): HGBA1C in the last 72 hours. CBG: Recent Labs  Lab 07/13/19 2135 07/14/19 0747 07/14/19 1138 07/14/19 1633 07/15/19 0756  GLUCAP 149* 108* 123* 103* 100*   Lipid Profile: No results for input(s): CHOL, HDL, LDLCALC, TRIG, CHOLHDL, LDLDIRECT in the last 72 hours. Thyroid Function Tests: No results for input(s):  TSH, T4TOTAL, FREET4, T3FREE, THYROIDAB in the last 72 hours. Anemia Panel: No results for input(s): VITAMINB12, FOLATE, FERRITIN, TIBC, IRON, RETICCTPCT in the last 72 hours. Sepsis Labs: No results for input(s): PROCALCITON, LATICACIDVEN in the last 168 hours.  No results found for this or any previous visit (from the past 240 hour(s)).    Radiology Studies: No results found.    LOS: 39 days   Time spent: More than 50% of that time was spent in counseling and/or coordination of care.  Antonieta Pert, MD [facility]  07/15/2019, 11:14 AM

## 2019-07-15 NOTE — Progress Notes (Signed)
RT placed pt on ATC 5L 28% at 0728. Pt saturations 100% and pt tolerating well at this time. Ventilator is on stand by at bedside. RT will continue to monitor.

## 2019-07-15 NOTE — Progress Notes (Signed)
NAME:  Derek Harrell, MRN:  [id number], DOB:  1946/04/16, LOS: 48 ADMISSION DATE:  06/06/2019, CONSULTATION DATE:  11/2 REFERRING MD:  Johnney Killian, CHIEF COMPLAINT:  Hypertensive crisis and fever    Brief History   73 year old male w/ h/o parkinson's dementia.  Admitted 11/2, 3 days s/p endovascular infrarenal aortic aneurysm repair on 10/29 w/ worsening weakness, MS change, HTN and fever.   Admitted to ICU, developed third degree heart block with agonal respirations with thick mucous suctioned requiring intubation.  Extubated on 11/4.  Ttransferred out to the floor 11/5 afternoon.  Then 11/6 early AM he suffered cardiac arrest.  RN alerted by telemetry to a run of VT.  He then became bradycardica and arrested. ACLS done for 12 mins prior to ROSC. He was unresponsive in the post-arrest setting requiring intubation. Transferred to ICU for TTM.  Post rewarming, patient has had no improvement in mental status with MRI consistent with hypoxic encephalopathy.   S/p tracheostomy/PEG tube placement 06/24/2019  Past Medical History  AAA, prior CVA, HTN, HL, MG, RLS, PD, OSA (did not tolerate CPAP), gait disorder   Significant Hospital Events   11/2 >>  admitted to ICU 11/3  >>Bradycardic arrest from mucus plugging.  11/4 >> extubated 11/5 >> transferred to tele/ TRH 11/6 >> PEA Cardiac Arrest, ROSC after 12 minutes >> began TTM to 36 11/7 >> Re-warming 11/7 completed at noon  11/20 >> trach, PEG by surgery. Had bleeding post procedure 11/24 >> Transfer to hospitalist service  Consults:  Vascular surgery  PMT  General surgery  Procedures:  ETT 11/3 >> 11/4, 11/6 >> CVC LIJ 11/06 >> 11/11 Right Radial Aline 11/06 >> 11/9 PEG tube placement 11/20 Tracheostomy 11/20  Significant Diagnostic Tests:  CT head 11/2 >> Stable white matter disease.  MRI brain 11/9 >> Restricted diffusion in the occipital lobes bilaterally and in the frontal parietal lobes over the convexity bilaterally compatible with  acute or subacute infarction. Atrophy and extensive chronic ischemic change in the brain. Multiple areas of chronic microhemorrhage which have progressed since 2017.  Micro Data:  Blood cultures 11/2>> negative   SARS Cov2 11/2 >> negative. Blood cultures 11/6 >> negative  Urine culture 11/6 >> negative Tracheal aspirate 11/6 >> negative  Antimicrobials:  Cefepime 11/6 >> 11/10 Vancomycin 11/6 >> 11/10  Interim history/subjective:  12/4: attempted PS today without success. Suspect pt will need 24/7 vent support. Cont to await dispo planning.  12/2: Last seen by [facility] 11/29, Family requesting SNF discharge.  Medicaid paper work started and may take up to 90 days.  Tolerated PSV from 12p-8p yesterday. Placed on rate overnight.  12/8: tolerating 5/5 then progressively 0/5 12/10 tolerated TC for 6 hours 12/11 tolerating TC this AM but remains unresponsive   Objective   Blood pressure 127/61, pulse (!) 42, temperature 98.7 F (37.1 C), temperature source Oral, resp. rate 18, height 6' (1.829 m), weight 78.1 kg, SpO2 100 %.    Vent Mode: PRVC FiO2 (%):  [28 %-30 %] 28 % Set Rate:  [15 bmp] 15 bmp Vt Set:  [520 mL] 520 mL PEEP:  [5 cmH20] 5 cmH20 Plateau Pressure:  [10 cmH20-22 cmH20] 22 cmH20   Intake/Output Summary (Last 24 hours) at 07/15/2019 0850 Last data filed at 07/15/2019 0813 Gross per 24 hour  Intake 1422 ml  Output 2250 ml  Net -828 ml   Filed Weights   07/13/19 0500 07/14/19 0253 07/15/19 0500  Weight: 81.5 kg 78 kg 78.1 kg  Examination:  Gen:     Acute on chronically ill appearing male, NAD, not following commands HEENT: [place]/AT, PERRL, EOM-I and MMM, trach is in a good position Lungs:   Coarse BS diffusely CV:   RRR, Nl S1/S2 and -M/R/G Abd:  Soft, NT, ND and +BS Ext: -edema and -tenderness Skin:  Warm and dry, no rash or wounds Neuro: Gag intact but not much else  I reviewed CXR myself, trach is in a good position  Assessment & Plan:   Acute hypoxemic  respiratory failure s/p trach Obstructive sleep apnea, noncompliant with treatment at baseline Anoxic encephalopathy.   - Trach care per protocol - Fluid balance to even at this point - Trach placed 11/20. Suture removal 12/2 - Maintain on TC as tolerated at this point - Do not change to a cuffless trach yet, no capping trials, no PMV given mental status - Will need facility placement that can place on vent given current neuro and respiratory condition  Discussed with PCCM-NP  Best practice:  Diet: Tube feeds at goal Pain/Anxiety/Delirium protocol (if indicated): Home meds VAP protocol (if indicated): In place DVT prophylaxis: Subcu heparin-held since 19th, scd per primary GI prophylaxis: Continue PPI Glucose control: SSI Mobility: BR Code Status: full code  Family Communication: Per primary Disposition: ICU, awaiting placement  Rush Farmer, M.D. [facility] Pulmonary/Critical Care Medicine.

## 2019-07-16 LAB — CBC
HCT: 27.3 % — ABNORMAL LOW (ref 39.0–52.0)
Hemoglobin: 8.7 g/dL — ABNORMAL LOW (ref 13.0–17.0)
MCH: 26.9 pg (ref 26.0–34.0)
MCHC: 31.9 g/dL (ref 30.0–36.0)
MCV: 84.3 fL (ref 80.0–100.0)
Platelets: 279 10*3/uL (ref 150–400)
RBC: 3.24 MIL/uL — ABNORMAL LOW (ref 4.22–5.81)
RDW: 14.6 % (ref 11.5–15.5)
WBC: 11.5 10*3/uL — ABNORMAL HIGH (ref 4.0–10.5)
nRBC: 0 % (ref 0.0–0.2)

## 2019-07-16 LAB — BASIC METABOLIC PANEL WITH GFR
Anion gap: 11 (ref 5–15)
BUN: 68 mg/dL — ABNORMAL HIGH (ref 8–23)
CO2: 27 mmol/L (ref 22–32)
Calcium: 8.8 mg/dL — ABNORMAL LOW (ref 8.9–10.3)
Chloride: 111 mmol/L (ref 98–111)
Creatinine, Ser: 0.76 mg/dL (ref 0.61–1.24)
GFR calc Af Amer: >60 mL/min
GFR calc non Af Amer: >60 mL/min
Glucose, Bld: 106 mg/dL — ABNORMAL HIGH (ref 70–99)
Potassium: 4.3 mmol/L (ref 3.5–5.1)
Sodium: 149 mmol/L — ABNORMAL HIGH (ref 135–145)

## 2019-07-16 LAB — GLUCOSE, CAPILLARY
Glucose-Capillary: 107 mg/dL — ABNORMAL HIGH (ref 70–99)
Glucose-Capillary: 108 mg/dL — ABNORMAL HIGH (ref 70–99)
Glucose-Capillary: 111 mg/dL — ABNORMAL HIGH (ref 70–99)
Glucose-Capillary: 112 mg/dL — ABNORMAL HIGH (ref 70–99)
Glucose-Capillary: 174 mg/dL — ABNORMAL HIGH (ref 70–99)
Glucose-Capillary: 96 mg/dL (ref 70–99)

## 2019-07-16 NOTE — Progress Notes (Signed)
PROGRESS NOTE    Derek Harrell  [id number]:[id number] DOB: March 02, 1946 DOA: 06/06/2019 PCP: [name], MD   Brief Narrative: Patient is a 73 year old African-American male with past medical history significant for parkinson's dementia, prior CVA, hypertension, hyperlipidemia, RLS, OSA( did not tolerate CPAP), gait disorder , AAA that was repaired with abdominal aortic endovascular stent graft 06/02/19 and 3 days psot op came to ER with fever, generalized weakness, altered mental status, hypotension, was admitted to ICU, had bradycardic arrest/3 degree heart block from mucous plugging on 11/3 intubated subsequently extubated 11/4- transferred to telemetry and sustained PEA cardiac arrest on 11/6 , RN alerted by VT in tele, CPR with ROSC then was placed on TTM subsequently underwent trach and PEG placement 11/20.  11/2 >>  admitted to ICU 11/3  >>Bradycardic arrest from mucus plugging.  11/4 >> extubated 11/5 >> transferred to tele/ TRH 11/6 >> PEA Cardiac Arrest, ROSC after 12 minutes >> began TTM to 36 11/7 >> Re-warming 11/7 completed at noon  11/20 >> trach, PEG by surgery. Had bleeding post procedure 11/24 >> Transfer to hospitalist service  Subjective: Seen in ICU, on trach collar at 5 L/min  28% weaning this morning. Patient is unresponsive Patient has been soft amlodipine held No fever episode.  WBC improved to 11.7k  Assessment & Plan:  Anoxic encephalopathy: Status post trach, PEG currently on ventilator-on weaning trial tolerated 6-hour 12/10, continue weaning to trach collar per PCCM.  Patient will need placement to skilled nursing facility with vent. Last chest x-ray last was okay.  Patient having secretion, continue suctioning, Robinul, Cont pulmonary support per PCCM. MRI consistent with hypoxic encephalopathy. Overall very poor prognosis. 07/24/2019: No acute changes.  Ventilator dependent with Acute Hypoxic resp failure:  07/16/2019: S/P Trach. Currently off vent for over  24 hours.    Intermittent fever: no Fever recurrence since 12/9.  suspect in the setting of anoxic encephalopathy. Chest x-ray 12/6 no pneumonia.  Patient does have excessive secretion will need close monitoring and supportive care. Patient at risk for pneumonia/further infection/bed sores. Cbc up at 14k -9 down great at 11.7K. Bradycardic arrest 11/3, cardiac arrest 11/6 overall poor prognosis. Nutrition continue PEG tube feeding 3 times a [place], prostat, 200 mL free water flush every 8 hours. Hypertensive emergency: Blood pressure in 90s to 100s. On 10 of amlodipine- switch to 2.5 mg and monitor BP. Unstageable 5 x 8 x 3 cm wound wound care following. Abdominal aortic aneurysm S/P repair 10/29 by vascular.  On Crestor. Dementia: Currently with anoxic encephalopathy, unresponsive OSA-non complaint with treatment at baseline. Anemia of chronic disease: Hb stable 8 to 9 g. Check routine labs in the morning.  Nutrition: Nutrition Problem: Inadequate oral intake Etiology: inability to eat Signs/Symptoms: NPO status Interventions: Tube feeding, Prostat  Pressure Ulcer: Pressure Injury 06/06/19 Sacrum Mid;Left Unstageable - Full thickness tissue loss in which the base of the ulcer is covered by slough (yellow, tan, gray, green or brown) and/or eschar (tan, brown or black) in the wound bed. unstageable when assessed on 1 (Active)  06/06/19 2032  Location: Sacrum  Location Orientation: Mid;Left  Staging: Unstageable - Full thickness tissue loss in which the base of the ulcer is covered by slough (yellow, tan, gray, green or brown) and/or eschar (tan, brown or black) in the wound bed.  Wound Description (Comments): unstageable when assessed on 12/7  Present on Admission: Yes     Pressure Injury 07/07/19 Neck Mid Stage II -  Partial thickness loss of dermis  presenting as a shallow open ulcer with a red, pink wound bed without slough. yellow shallow penny sized (Active)  07/07/19 1200  Location:  Neck  Location Orientation: Mid  Staging: Stage II -  Partial thickness loss of dermis presenting as a shallow open ulcer with a red, pink wound bed without slough.  Wound Description (Comments): yellow shallow penny sized  Present on Admission:     Body mass index is 22.48 kg/m.   Goals of care: palliative care consulted- RN reprots palliative care has come by 12/9  DVT prophylaxis: SCD/lovenox. He is at high risk for DVT. Code Status: FULL CODE Family Communication: Discussed with the nursing staff.No family at bedside.  Disposition Plan: Remains inpatient pending placement to skilled nursing facility with vent support.  Remains in ICU, continue vent weaning trial as per critical care. Discussed with the nurse stop this morning-patient wife has not been adamant about continuing on current full care. Palliative on consult for GOC.  Consultants: PCCM,palliative care. Procedures:  CT head 11/2 >> Stable white matter disease.  MRI brain 11/9 >> Restricted diffusion in the occipital lobes bilaterally and in the frontal parietal lobes over the convexity bilaterally compatible with acute or subacute infarction. Atrophy and extensive chronic ischemic change in the brain. Multiple areas of chronic microhemorrhage which have progressed since 2017. Microbiology: Blood cultures 11/2>> negative   SARS Cov2 11/2 >> negative. Blood cultures 11/6 >> negative  Urine culture 11/6 >> negative Tracheal aspirate 11/6 >> negative  Antimicrobials: Anti-infectives (From admission, onward)   Start     Dose/Rate Route Frequency Ordered Stop   06/14/19 0600  vancomycin (VANCOCIN) 1,500 mg in sodium chloride 0.9 % 500 mL IVPB  Status:  Discontinued     1,500 mg 250 mL/hr over 120 Minutes Intravenous Every 24 hours 06/13/19 1207 06/13/19 1220   06/13/19 1800  vancomycin (VANCOCIN) 1,500 mg in sodium chloride 0.9 % 500 mL IVPB  Status:  Discontinued     1,500 mg 250 mL/hr over 120 Minutes Intravenous  Every 24 hours 06/13/19 1220 06/14/19 0944   06/10/19 0615  vancomycin (VANCOCIN) IVPB 1000 mg/200 mL premix  Status:  Discontinued     1,000 mg 200 mL/hr over 60 Minutes Intravenous Every 12 hours 06/10/19 0605 06/13/19 1207   06/10/19 0615  ceFEPIme (MAXIPIME) 2 g in sodium chloride 0.9 % 100 mL IVPB  Status:  Discontinued     2 g 200 mL/hr over 30 Minutes Intravenous Every 8 hours 06/10/19 0605 06/14/19 0944       Objective: Vitals:   07/16/19 0730 07/16/19 0800 07/16/19 0900 07/16/19 1105  BP: 118/75 126/75 125/75   Pulse: 81 76 72 98  Resp: _0 Temp: 98.4 F (36.9 C)     TempSrc: Oral     SpO2: 100% 100% 100% 100%  Weight:      Height:        Intake/Output Summary (Last 24 hours) at 07/16/2019 1214 Last data filed at 07/16/2019 0800 Gross per 24 hour  Intake 704 ml  Output 3200 ml  Net -2496 ml   Filed Weights   07/14/19 0253 07/15/19 0500 07/16/19 0500  Weight: 78 kg 78.1 kg 75.2 kg   Weight change: -2.9 kg  Body mass index is 22.48 kg/m.  Intake/Output from previous [place]: 12/11 0701 - 12/12 0700 In: 674 [NG/GT:674] Out: 3000 [Urine:3000] Intake/Output this shift: Total I/O In: 504 [Other:30; NG/GT:474] Out: 500 [Urine:500]  Examination:  General exam: Unresponsive eyes  are closed, on trach collar, chronically ill-appearing HEENT:Oral mucosa moist, Ear/Nose WNL grossly, PUPIL sluggish to react trach collar-currently on 5 L  o2 weaning. Respiratory system: Clear anteriorly. S/P Trach Cardiovascular system: S1 & S2   Gastrointestinal system: Abdomen soft, PEG+, BS+ Nervous System: Unresponsive, pupils sluggish. spontaneously breathing on the weaning trial Extremities: ?Edema of upper extremity (likely from lack of movement)  Medications:  Scheduled Meds: . amLODipine  2.5 mg Per J Tube Daily  . amLODipine  2.5 mg Per J Tube Once  . Carbidopa-Levodopa ER  1 tablet Per Tube 5 X Daily  . chlorhexidine gluconate (MEDLINE KIT)  15 mL Mouth Rinse  BID  . Chlorhexidine Gluconate Cloth  6 each Topical Daily  . collagenase   Topical Daily  . docusate  50 mg Per Tube Daily  . enoxaparin (LOVENOX) injection  40 mg Subcutaneous Q24H  . feeding supplement (OSMOLITE 1.5 CAL)  474 mL Per Tube TID  . feeding supplement (PRO-STAT SUGAR FREE 64)  30 mL Per Tube BID  . free water  200 mL Per Tube Q8H  . insulin aspart  0-15 Units Subcutaneous TID WC  . mouth rinse  15 mL Mouth Rinse 10 times per Word  . nutrition supplement (JUVEN)  1 packet Per Tube BID BM  . pantoprazole sodium  40 mg Per Tube Daily  . Pimavanserin Tartrate  34 mg Per Tube Daily  . rosuvastatin  10 mg Per Tube q1800   Continuous Infusions: . sodium chloride Stopped (06/10/19 0000)  . sodium chloride Stopped (06/21/19 1744)    Data Reviewed: I have personally reviewed following labs and imaging studies  CBC: Recent Labs  Lab 07/11/19 0257 07/12/19 0320 07/14/19 0403 07/15/19 0610 07/16/19 0241  WBC 9.9 10.3 14.0* 11.7* 11.5*  NEUTROABS 7.0 7.9*  --   --   --   HGB 8.6* 8.9* 9.1* 8.4* 8.7*  HCT 26.5* 28.2* 29.4* 26.5* 27.3*  MCV 82.6 83.2 86.2 83.3 84.3  PLT 163 192 220 255 409   Basic Metabolic Panel: Recent Labs  Lab 07/11/19 0257 07/12/19 0320 07/14/19 0704 07/15/19 0610 07/16/19 0241  NA 139 139 142 144 149*  K 4.7 4.3 4.5 4.3 4.3  CL 103 104 106 106 111  CO2 _0 GLUCOSE 119* 120* 109* 118* 106*  BUN 54* 52* 60* 71* 68*  CREATININE 0.85 0.90 0.91 0.96 0.76  CALCIUM 8.7* 8.9 8.6* 9.0 8.8*  MG  --   --   --  2.4  --    GFR: Estimated Creatinine Clearance: 90.1 mL/min (by C-G formula based on SCr of 0.76 mg/dL). Liver Function Tests: Recent Labs  Lab 07/11/19 0257 07/12/19 0320  AST 35 33  ALT 11 8  ALKPHOS 88 77  BILITOT 0.8 0.6  PROT 6.4* 6.9  ALBUMIN 2.1* 2.3*   No results for input(s): LIPASE, AMYLASE in the last 168 hours. No results for input(s): AMMONIA in the last 168 hours. Coagulation Profile: No results for  input(s): INR, PROTIME in the last 168 hours. Cardiac Enzymes: No results for input(s): CKTOTAL, CKMB, CKMBINDEX, TROPONINI in the last 168 hours. BNP (last 3 results) No results for input(s): PROBNP in the last 8760 hours. HbA1C: No results for input(s): HGBA1C in the last 72 hours. CBG: Recent Labs  Lab 07/15/19 1927 07/15/19 2357 07/16/19 0318 07/16/19 0749 07/16/19 1155  GLUCAP 178* 98 96 107* 112*   Lipid Profile: No results for input(s): CHOL, HDL, LDLCALC, TRIG, CHOLHDL,  LDLDIRECT in the last 72 hours. Thyroid Function Tests: No results for input(s): TSH, T4TOTAL, FREET4, T3FREE, THYROIDAB in the last 72 hours. Anemia Panel: No results for input(s): VITAMINB12, FOLATE, FERRITIN, TIBC, IRON, RETICCTPCT in the last 72 hours. Sepsis Labs: No results for input(s): PROCALCITON, LATICACIDVEN in the last 168 hours.  No results found for this or any previous visit (from the past 240 hour(s)).    Radiology Studies: No results found.    LOS: 40 days   Time spent: More than 50% of that time was spent in counseling and/or coordination of care.  Bonnell Public, MD [facility]  07/16/2019, 12:14 PM

## 2019-07-16 NOTE — Progress Notes (Addendum)
Palliative Care Family Meeting  I met with Mrs. Bischof to discuss her husbands goals of care and to provide additional support in difficult decision making.  I reviewed his clinical information with her and also showed her MRI images of his brain. I explained the severity of his Brain damage and also discussed his baseline poor health and functional status.  She knows this is likely not a reversible condition but is relying on God to sort this out. She wants continued artifical life support to continue until "God is ready to take him". We discussed the trajectory of PVS and the slow decline with skin breakdown, wounds, painful contractures, repeated infections and caregiver burdens. I asked her to consider what Roel would tell her if he could not see, hear, move or experience joy.  We discussed DNR. She is going to think about this and the implications in the event of his death. I strongly advised against such an attempt.  Will continue to follow as needed- continue supportive medical care.  Lane Hacker, DO Palliative Medicine [phone]  Time: 35 min Greater than 50%  of this time was spent counseling and coordinating care related to the above assessment and plan.

## 2019-07-17 ENCOUNTER — Other Ambulatory Visit: Payer: Self-pay

## 2019-07-17 ENCOUNTER — Inpatient Hospital Stay (HOSPITAL_COMMUNITY): Payer: Medicare Other

## 2019-07-17 DIAGNOSIS — E860 Dehydration: Secondary | ICD-10-CM

## 2019-07-17 DIAGNOSIS — E870 Hyperosmolality and hypernatremia: Secondary | ICD-10-CM

## 2019-07-17 LAB — POCT I-STAT 7, (LYTES, BLD GAS, ICA,H+H)
Acid-Base Excess: 4 mmol/L — ABNORMAL HIGH (ref 0.0–2.0)
Bicarbonate: 28.3 mmol/L — ABNORMAL HIGH (ref 20.0–28.0)
Calcium, Ion: 1.29 mmol/L (ref 1.15–1.40)
HCT: 25 % — ABNORMAL LOW (ref 39.0–52.0)
Hemoglobin: 8.5 g/dL — ABNORMAL LOW (ref 13.0–17.0)
O2 Saturation: 97 %
Patient temperature: 98.5
Potassium: 3.5 mmol/L (ref 3.5–5.1)
Sodium: 151 mmol/L — ABNORMAL HIGH (ref 135–145)
TCO2: 29 mmol/L (ref 22–32)
pCO2 arterial: 37.8 mmHg (ref 32.0–48.0)
pH, Arterial: 7.482 — ABNORMAL HIGH (ref 7.350–7.450)
pO2, Arterial: 86 mmHg (ref 83.0–108.0)

## 2019-07-17 LAB — GLUCOSE, CAPILLARY
Glucose-Capillary: 101 mg/dL — ABNORMAL HIGH (ref 70–99)
Glucose-Capillary: 105 mg/dL — ABNORMAL HIGH (ref 70–99)
Glucose-Capillary: 110 mg/dL — ABNORMAL HIGH (ref 70–99)
Glucose-Capillary: 125 mg/dL — ABNORMAL HIGH (ref 70–99)

## 2019-07-17 MED ORDER — SODIUM CHLORIDE 0.9 % IV SOLN
2.0000 g | Freq: Three times a day (TID) | INTRAVENOUS | Status: DC
  Administered 2019-07-17 – 2019-07-20 (×9): 2 g via INTRAVENOUS
  Filled 2019-07-17 (×9): qty 2

## 2019-07-17 MED ORDER — FREE WATER
400.0000 mL | Freq: Four times a day (QID) | Status: DC
  Administered 2019-07-17 – 2019-07-19 (×8): 400 mL

## 2019-07-17 MED ORDER — BUDESONIDE 0.25 MG/2ML IN SUSP
0.2500 mg | Freq: Two times a day (BID) | RESPIRATORY_TRACT | Status: DC
  Administered 2019-07-17 – 2019-07-25 (×16): 0.25 mg via RESPIRATORY_TRACT
  Filled 2019-07-17 (×16): qty 2

## 2019-07-17 MED ORDER — DEXTROSE 5 % IV SOLN
INTRAVENOUS | Status: AC
  Administered 2019-07-17: 12:00:00 via INTRAVENOUS

## 2019-07-17 MED ORDER — IPRATROPIUM-ALBUTEROL 0.5-2.5 (3) MG/3ML IN SOLN
3.0000 mL | Freq: Four times a day (QID) | RESPIRATORY_TRACT | Status: DC
  Administered 2019-07-17 – 2019-07-19 (×8): 3 mL via RESPIRATORY_TRACT
  Filled 2019-07-17 (×9): qty 3

## 2019-07-17 NOTE — Progress Notes (Signed)
PROGRESS NOTE    Derek Harrell  TKW:[id number] DOB: 11/04/1945 DOA: 06/06/2019 PCP: [name], MD   Brief Narrative: Patient is a 73 year old African-American male with past medical history significant for parkinson's dementia, prior CVA, hypertension, hyperlipidemia, RLS, OSA( did not tolerate CPAP), gait disorder , AAA that was repaired with abdominal aortic endovascular stent graft 06/02/19 and 3 days psot op came to ER with fever, generalized weakness, altered mental status, hypotension, was admitted to ICU, had bradycardic arrest/3 degree heart block from mucous plugging on 11/3 intubated subsequently extubated 11/4- transferred to telemetry and sustained PEA cardiac arrest on 11/6 , RN alerted by VT in tele, CPR with ROSC then was placed on TTM subsequently underwent trach and PEG placement 11/20.  11/2 >>  admitted to ICU 11/3  >>Bradycardic arrest from mucus plugging.  11/4 >> extubated 11/5 >> transferred to tele/ TRH 11/6 >> PEA Cardiac Arrest, ROSC after 12 minutes >> began TTM to 36 11/7 >> Re-warming 11/7 completed at noon  11/20 >> trach, PEG by surgery. Had bleeding post procedure 11/24 >> Transfer to hospitalist service  07/17/2019: Increased tracheal secretions reported.  Multiple, intermittent episodes of bradycardia, with heart rate in the 40s.  We will send tracheal aspirate for culture and sensitivity, start patient on IV cefepime (prior urine nasal swab was negative for MRSA).  Sodium is noted to be 151.  Will increase free water to 400 cc every 6 hourly.  Will start patient on IV D5W at 50 cc/h x 20 hours.  Will repeat renal function in the morning.  Twelve-lead EKG is reviewed.  ABGs reviewed.  Will have low threshold to reconsult cardiology team if significant bradycardia is noted or persist.  Subjective: No history from patient Patient is unresponsive No fever episode.   Intermittent sinus bradycardia is noted Worsening sodium level Increased tracheal  secretions reported  Assessment & Plan:  Anoxic encephalopathy: Status post trach, PEG currently on ventilator-on weaning trial tolerated 6-hour 12/10, continue weaning to trach collar per PCCM.  Patient will need placement to skilled nursing facility with vent. Last chest x-ray last was okay.  Patient having secretion, continue suctioning, Robinul, Cont pulmonary support per PCCM. MRI consistent with hypoxic encephalopathy. Overall very poor prognosis. 07/17/2019: No changes.  Continue current care.  Continue to address goal of care.  Ventilator dependent with Acute Hypoxic resp failure:  07/17/2019: S/P Trach. Currently off vent for over close to 48 hours.     Increased tracheal secretions: -This is worrisome for tracheitis. -Tracheal aspirate for culture and sensitivity. -IV cefepime -Further management will depend on above.  Intermittent fever: no Fever recurrence since 12/9.  suspect in the setting of anoxic encephalopathy. Chest x-ray 12/6 no pneumonia.  Patient does have excessive secretion will need close monitoring and supportive care. Patient at risk for pneumonia/further infection/bed sores.   Bradycardic arrest 11/3, cardiac arrest 11/6: -Overall poor prognosis. -Episodes of sinus bradycardia in the 40s noted today. -Continue to monitor closely. -Have a low threshold to reconsult the cardiology team.  Dehydration/hypernatremia: -Sodium is 151 today. -Increase free water to 400 cc every 6 hourly -D5 water 50 cc/h x 20 hours -A.m. labs -Continue to monitor renal function and electrolytes closely.  Nutrition continue PEG tube feeding 3 times a Hyser, prostat, 200 mL free water flush every 8 hours. Hypertensive emergency: Blood pressure in 90s to 100s. On 10 of amlodipine- switch to 2.5 mg and monitor BP. Unstageable 5 x 8 x 3 cm wound wound care following.  Abdominal aortic aneurysm S/P repair 10/29 by vascular.  On Crestor. Dementia: Currently with anoxic encephalopathy,  unresponsive OSA-non complaint with treatment at baseline. Anemia of chronic disease: Hb stable 8 to 9 g. Check routine labs in the morning.  Nutrition: Nutrition Problem: Inadequate oral intake Etiology: inability to eat Signs/Symptoms: NPO status Interventions: Tube feeding, Prostat  Pressure Ulcer: Pressure Injury 06/06/19 Sacrum Mid;Left Unstageable - Full thickness tissue loss in which the base of the ulcer is covered by slough (yellow, tan, gray, green or brown) and/or eschar (tan, brown or black) in the wound bed. unstageable when assessed on 1 (Active)  06/06/19 2032  Location: Sacrum  Location Orientation: Mid;Left  Staging: Unstageable - Full thickness tissue loss in which the base of the ulcer is covered by slough (yellow, tan, gray, green or brown) and/or eschar (tan, brown or black) in the wound bed.  Wound Description (Comments): unstageable when assessed on 12/7  Present on Admission: Yes     Pressure Injury 07/07/19 Neck Mid Stage II -  Partial thickness loss of dermis presenting as a shallow open ulcer with a red, pink wound bed without slough. yellow shallow penny sized (Active)  07/07/19 1200  Location: Neck  Location Orientation: Mid  Staging: Stage II -  Partial thickness loss of dermis presenting as a shallow open ulcer with a red, pink wound bed without slough.  Wound Description (Comments): yellow shallow penny sized  Present on Admission:     Body mass index is 23.08 kg/m.   Goals of care: palliative care consulted- RN reprots palliative care has come by 12/9  DVT prophylaxis: SCD/lovenox. He is at high risk for DVT. Code Status: FULL CODE Family Communication: Discussed with the nursing staff.No family at bedside.  Disposition Plan: Remains inpatient pending placement to skilled nursing facility with vent support.  Remains in ICU, continue vent weaning trial as per critical care. Discussed with the nurse stop this morning-patient wife has not been  adamant about continuing on current full care. Palliative on consult for GOC.  Consultants: PCCM,palliative care. Procedures:  CT head 11/2 >> Stable white matter disease.  MRI brain 11/9 >> Restricted diffusion in the occipital lobes bilaterally and in the frontal parietal lobes over the convexity bilaterally compatible with acute or subacute infarction. Atrophy and extensive chronic ischemic change in the brain. Multiple areas of chronic microhemorrhage which have progressed since 2017. Microbiology: Blood cultures 11/2>> negative   SARS Cov2 11/2 >> negative. Blood cultures 11/6 >> negative  Urine culture 11/6 >> negative Tracheal aspirate 11/6 >> negative  Antimicrobials: Anti-infectives (From admission, onward)   Start     Dose/Rate Route Frequency Ordered Stop   07/17/19 1400  ceFEPIme (MAXIPIME) 2 g in sodium chloride 0.9 % 100 mL IVPB     2 g 200 mL/hr over 30 Minutes Intravenous Every 8 hours 07/17/19 1157     06/14/19 0600  vancomycin (VANCOCIN) 1,500 mg in sodium chloride 0.9 % 500 mL IVPB  Status:  Discontinued     1,500 mg 250 mL/hr over 120 Minutes Intravenous Every 24 hours 06/13/19 1207 06/13/19 1220   06/13/19 1800  vancomycin (VANCOCIN) 1,500 mg in sodium chloride 0.9 % 500 mL IVPB  Status:  Discontinued     1,500 mg 250 mL/hr over 120 Minutes Intravenous Every 24 hours 06/13/19 1220 06/14/19 0944   06/10/19 0615  vancomycin (VANCOCIN) IVPB 1000 mg/200 mL premix  Status:  Discontinued     1,000 mg 200 mL/hr over 60 Minutes Intravenous  Every 12 hours 06/10/19 0605 06/13/19 1207   06/10/19 0615  ceFEPIme (MAXIPIME) 2 g in sodium chloride 0.9 % 100 mL IVPB  Status:  Discontinued     2 g 200 mL/hr over 30 Minutes Intravenous Every 8 hours 06/10/19 0605 06/14/19 0944       Objective: Vitals:   07/17/19 1300 07/17/19 1400 07/17/19 1503 07/17/19 1510  BP: (!) 129/53 124/72    Pulse: 77 80  86  Resp: (!) 21 (!) 21  (!) 22  Temp:      TempSrc:      SpO2: 98%  98% 97% 98%  Weight:      Height:        Intake/Output Summary (Last 24 hours) at 07/17/2019 1523 Last data filed at 07/17/2019 1400 Gross per 24 hour  Intake 2766.06 ml  Output 2200 ml  Net 566.06 ml   Filed Weights   07/15/19 0500 07/16/19 0500 07/17/19 0455  Weight: 78.1 kg 75.2 kg 77.2 kg   Weight change: 2 kg  Body mass index is 23.08 kg/m.  Intake/Output from previous Teale: 12/12 0701 - 12/13 0700 In: 2382 [NG/GT:2022] Out: 2625 [Urine:2625] Intake/Output this shift: Total I/O In: 1772.1 [I.V.:95.5; Other:1200; NG/GT:400; IV Piggyback:76.5] Out: 700 [Urine:700]  Examination:  General exam: Unresponsive eyes are closed, on trach collar, chronically ill-appearing HEENT:Oral mucosa moist, Ear/Nose WNL grossly, PUPIL sluggish to react trach collar-currently on 5 L  o2 weaning. Respiratory system: Clear anteriorly. S/P Trach Cardiovascular system: S1 & S2   Gastrointestinal system: Abdomen soft, PEG+, BS+ Nervous System: Unresponsive, pupils sluggish. spontaneously breathing on the weaning trial Extremities: ?Edema of upper extremity (likely from lack of movement)  Medications:  Scheduled Meds: . amLODipine  2.5 mg Per J Tube Daily  . amLODipine  2.5 mg Per J Tube Once  . budesonide (PULMICORT) nebulizer solution  0.25 mg Nebulization BID  . Carbidopa-Levodopa ER  1 tablet Per Tube 5 X Daily  . chlorhexidine gluconate (MEDLINE KIT)  15 mL Mouth Rinse BID  . Chlorhexidine Gluconate Cloth  6 each Topical Daily  . collagenase   Topical Daily  . docusate  50 mg Per Tube Daily  . enoxaparin (LOVENOX) injection  40 mg Subcutaneous Q24H  . feeding supplement (OSMOLITE 1.5 CAL)  474 mL Per Tube TID  . feeding supplement (PRO-STAT SUGAR FREE 64)  30 mL Per Tube BID  . free water  400 mL Per Tube Q6H  . insulin aspart  0-15 Units Subcutaneous TID WC  . ipratropium-albuterol  3 mL Nebulization Q6H  . mouth rinse  15 mL Mouth Rinse 10 times per Lill  . nutrition supplement  (JUVEN)  1 packet Per Tube BID BM  . pantoprazole sodium  40 mg Per Tube Daily  . Pimavanserin Tartrate  34 mg Per Tube Daily  . rosuvastatin  10 mg Per Tube q1800   Continuous Infusions: . sodium chloride Stopped (06/10/19 0000)  . sodium chloride Stopped (06/21/19 1744)  . ceFEPime (MAXIPIME) IV 200 mL/hr at 07/17/19 1400  . dextrose 50 mL/hr at 07/17/19 1400    Data Reviewed: I have personally reviewed following labs and imaging studies  CBC: Recent Labs  Lab 07/11/19 0257 07/12/19 0320 07/14/19 0403 07/15/19 0610 07/16/19 0241 07/17/19 1130  WBC 9.9 10.3 14.0* 11.7* 11.5*  --   NEUTROABS 7.0 7.9*  --   --   --   --   HGB 8.6* 8.9* 9.1* 8.4* 8.7* 8.5*  HCT 26.5* 28.2* 29.4* 26.5* 27.3*  25.0*  MCV 82.6 83.2 86.2 83.3 84.3  --   PLT 163 192 220 255 279  --    Basic Metabolic Panel: Recent Labs  Lab 07/11/19 0257 07/12/19 0320 07/14/19 0704 07/15/19 0610 07/16/19 0241 07/17/19 1130  NA 139 139 142 144 149* 151*  K 4.7 4.3 4.5 4.3 4.3 3.5  CL 103 104 106 106 111  --   CO2 _0 --   GLUCOSE 119* 120* 109* 118* 106*  --   BUN 54* 52* 60* 71* 68*  --   CREATININE 0.85 0.90 0.91 0.96 0.76  --   CALCIUM 8.7* 8.9 8.6* 9.0 8.8*  --   MG  --   --   --  2.4  --   --    GFR: Estimated Creatinine Clearance: 92.5 mL/min (by C-G formula based on SCr of 0.76 mg/dL). Liver Function Tests: Recent Labs  Lab 07/11/19 0257 07/12/19 0320  AST 35 33  ALT 11 8  ALKPHOS 88 77  BILITOT 0.8 0.6  PROT 6.4* 6.9  ALBUMIN 2.1* 2.3*   No results for input(s): LIPASE, AMYLASE in the last 168 hours. No results for input(s): AMMONIA in the last 168 hours. Coagulation Profile: No results for input(s): INR, PROTIME in the last 168 hours. Cardiac Enzymes: No results for input(s): CKTOTAL, CKMB, CKMBINDEX, TROPONINI in the last 168 hours. BNP (last 3 results) No results for input(s): PROBNP in the last 8760 hours. HbA1C: No results for input(s): HGBA1C in the last 72  hours. CBG: Recent Labs  Lab 07/16/19 1559 07/16/19 1959 07/16/19 2347 07/17/19 0737 07/17/19 1128  GLUCAP 111* 174* 108* 110* 125*   Lipid Profile: No results for input(s): CHOL, HDL, LDLCALC, TRIG, CHOLHDL, LDLDIRECT in the last 72 hours. Thyroid Function Tests: No results for input(s): TSH, T4TOTAL, FREET4, T3FREE, THYROIDAB in the last 72 hours. Anemia Panel: No results for input(s): VITAMINB12, FOLATE, FERRITIN, TIBC, IRON, RETICCTPCT in the last 72 hours. Sepsis Labs: No results for input(s): PROCALCITON, LATICACIDVEN in the last 168 hours.  No results found for this or any previous visit (from the past 240 hour(s)).    Radiology Studies: DG CHEST PORT 1 VIEW  Result Date: 07/17/2019 CLINICAL DATA:  Tracheitis EXAM: PORTABLE CHEST 1 VIEW COMPARISON:  07/10/2019 FINDINGS: Cardiomegaly. Tracheostomy. Both lungs are clear. The visualized skeletal structures are unremarkable. IMPRESSION: 1.  Tracheostomy without acute abnormality of the lungs. 2.  Cardiomegaly. Electronically Signed   By: Eddie Candle M.D.   On: 07/17/2019 13:53      LOS: 41 days   Time spent: More than 50% of that time was spent in counseling and/or coordination of care.  Bonnell Public, MD [facility]  07/17/2019, 3:23 PM

## 2019-07-17 NOTE — Progress Notes (Signed)
Pharmacy Antibiotic Note  Derek Harrell is a 73 y.o. male admitted on 06/06/2019 to the  ICU and has a complicated hospital course including a PEA arrest. He is s/p trach and PEG placement. Currently has been off the vent for 24 hours.  Pharmacy has been consulted for cefepime dosing.  Patient has been afebrile without leukocytosis but increasing secretions. Continue to follow for de-escalation.  Plan: Cefepime 2g IV q8h  Height: 6' (182.9 cm) Weight: 170 lb 3.1 oz (77.2 kg) IBW/kg (Calculated) : 77.6  Temp (24hrs), Avg:98.5 F (36.9 C), Min:98 F (36.7 C), Max:99 F (37.2 C)  Recent Labs  Lab 07/11/19 0257 07/12/19 0320 07/14/19 0403 07/14/19 0704 07/15/19 0610 07/16/19 0241  WBC 9.9 10.3 14.0*  --  11.7* 11.5*  CREATININE 0.85 0.90  --  0.91 0.96 0.76    Estimated Creatinine Clearance: 92.5 mL/min (by C-G formula based on SCr of 0.76 mg/dL).    No Known Allergies  Antimicrobials this admission: Cefepime 11/6>>11/10, 12/13>> Vancomycin 11/6>11/19   Microbiology results: 11/6 BCx: ng 5 days 11/6 UCx: no growth  11/6 Sputum: normal resp flora    Thank you for allowing pharmacy to be a part of this patient's care.  Phillis Haggis 07/17/2019 11:50 AM

## 2019-07-18 LAB — CBC WITH DIFFERENTIAL/PLATELET
Abs Immature Granulocytes: 0.07 10*3/uL (ref 0.00–0.07)
Basophils Absolute: 0.1 10*3/uL (ref 0.0–0.1)
Basophils Relative: 1 %
Eosinophils Absolute: 0.5 10*3/uL (ref 0.0–0.5)
Eosinophils Relative: 4 %
HCT: 29.7 % — ABNORMAL LOW (ref 39.0–52.0)
Hemoglobin: 8.8 g/dL — ABNORMAL LOW (ref 13.0–17.0)
Immature Granulocytes: 1 %
Lymphocytes Relative: 18 %
Lymphs Abs: 1.9 10*3/uL (ref 0.7–4.0)
MCH: 26.5 pg (ref 26.0–34.0)
MCHC: 29.6 g/dL — ABNORMAL LOW (ref 30.0–36.0)
MCV: 89.5 fL (ref 80.0–100.0)
Monocytes Absolute: 0.9 10*3/uL (ref 0.1–1.0)
Monocytes Relative: 8 %
Neutro Abs: 7.1 10*3/uL (ref 1.7–7.7)
Neutrophils Relative %: 68 %
Platelets: 287 10*3/uL (ref 150–400)
RBC: 3.32 MIL/uL — ABNORMAL LOW (ref 4.22–5.81)
RDW: 14.9 % (ref 11.5–15.5)
WBC: 10.5 10*3/uL (ref 4.0–10.5)
nRBC: 0 % (ref 0.0–0.2)

## 2019-07-18 LAB — GLUCOSE, CAPILLARY
Glucose-Capillary: 82 mg/dL (ref 70–99)
Glucose-Capillary: 118 mg/dL — ABNORMAL HIGH (ref 70–99)
Glucose-Capillary: 154 mg/dL — ABNORMAL HIGH (ref 70–99)
Glucose-Capillary: 159 mg/dL — ABNORMAL HIGH (ref 70–99)

## 2019-07-18 LAB — RENAL FUNCTION PANEL
Albumin: 2 g/dL — ABNORMAL LOW (ref 3.5–5.0)
Anion gap: 10 (ref 5–15)
BUN: 63 mg/dL — ABNORMAL HIGH (ref 8–23)
CO2: 26 mmol/L (ref 22–32)
Calcium: 9 mg/dL (ref 8.9–10.3)
Chloride: 113 mmol/L — ABNORMAL HIGH (ref 98–111)
Creatinine, Ser: 0.86 mg/dL (ref 0.61–1.24)
GFR calc Af Amer: >60 mL/min (ref >60)
GFR calc non Af Amer: >60 mL/min (ref >60)
Glucose, Bld: 102 mg/dL — ABNORMAL HIGH (ref 70–99)
Phosphorus: 4.2 mg/dL (ref 2.5–4.6)
Potassium: 4.7 mmol/L (ref 3.5–5.1)
Sodium: 149 mmol/L — ABNORMAL HIGH (ref 135–145)

## 2019-07-18 LAB — MAGNESIUM: Magnesium: 2.4 mg/dL (ref 1.7–2.4)

## 2019-07-18 MED ORDER — FLUCONAZOLE 40 MG/ML PO SUSR
100.0000 mg | Freq: Every day | ORAL | Status: DC
  Administered 2019-07-18 – 2019-07-25 (×8): 100 mg
  Filled 2019-07-18 (×9): qty 2.5

## 2019-07-18 NOTE — Progress Notes (Signed)
NAME:  Derek Harrell, MRN:  [id number], DOB:  1945/10/01, LOS: 62 ADMISSION DATE:  06/06/2019, CONSULTATION DATE:  11/2 REFERRING MD:  Johnney Killian, CHIEF COMPLAINT:  Hypertensive crisis and fever    Brief History   73 year old male w/ h/o parkinson's dementia.  Admitted 11/2, 3 days s/p endovascular infrarenal aortic aneurysm repair on 10/29 w/ worsening weakness, MS change, HTN and fever.   Admitted to ICU, developed third degree heart block with agonal respirations with thick mucous suctioned requiring intubation.  Extubated on 11/4.  Ttransferred out to the floor 11/5 afternoon.  Then 11/6 early AM he suffered cardiac arrest.  RN alerted by telemetry to a run of VT.  He then became bradycardica and arrested. ACLS done for 12 mins prior to ROSC. He was unresponsive in the post-arrest setting requiring intubation. Transferred to ICU for TTM.  Post rewarming, patient has had no improvement in mental status with MRI consistent with hypoxic encephalopathy.   S/p tracheostomy/PEG tube placement 06/24/2019  Past Medical History  AAA, prior CVA, HTN, HL, MG, RLS, PD, OSA (did not tolerate CPAP), gait disorder   Significant Hospital Events   11/2 >>  admitted to ICU 11/3  >>Bradycardic arrest from mucus plugging.  11/4 >> extubated 11/5 >> transferred to tele/ TRH 11/6 >> PEA Cardiac Arrest, ROSC after 12 minutes >> began TTM to 36 11/7 >> Re-warming 11/7 completed at noon  11/20 >> trach, PEG by surgery. Had bleeding post procedure 11/24 >> Transfer to hospitalist service  Consults:  Vascular surgery  PMT  General surgery  Procedures:  ETT 11/3 >> 11/4, 11/6 >> CVC LIJ 11/06 >> 11/11 Right Radial Aline 11/06 >> 11/9 PEG tube placement 11/20 Tracheostomy 11/20  Significant Diagnostic Tests:  CT head 11/2 >> Stable white matter disease.  MRI brain 11/9 >> Restricted diffusion in the occipital lobes bilaterally and in the frontal parietal lobes over the convexity bilaterally compatible with  acute or subacute infarction. Atrophy and extensive chronic ischemic change in the brain. Multiple areas of chronic microhemorrhage which have progressed since 2017.  Micro Data:  Blood cultures 11/2>> negative   SARS Cov2 11/2 >> negative. Blood cultures 11/6 >> negative  Urine culture 11/6 >> negative Tracheal aspirate 11/6 >> negative  Antimicrobials:  Cefepime 11/6 >> 11/10 Vancomycin 11/6 >> 11/10  Interim history/subjective:  12/4: attempted PS today without success. Suspect pt will need 24/7 vent support. Cont to await dispo planning.  12/2: Last seen by [facility] 11/29, Family requesting SNF discharge.  Medicaid paper work started and may take up to 90 days.  Tolerated PSV from 12p-8p yesterday. Placed on rate overnight.  12/8: tolerating 5/5 then progressively 0/5 12/10 tolerated TC for 6 hours 12/11 tolerating TC this AM but remains unresponsive  12/14 72 hours off the vent but had an episode of bradycardia overnight and there was a concern of having to go back on the ventilator (was not necessary)  Objective   Blood pressure 123/86, pulse 68, temperature 98 F (36.7 C), temperature source Oral, resp. rate 18, height 6' (1.829 m), weight 76.5 kg, SpO2 100 %.    FiO2 (%):  [28 %] 28 %   Intake/Output Summary (Last 24 hours) at 07/18/2019 0826 Last data filed at 07/18/2019 0619 Gross per 24 hour  Intake 3866.63 ml  Output 1750 ml  Net 2116.63 ml   Filed Weights   07/16/19 0500 07/17/19 0455 07/18/19 0433  Weight: 75.2 kg 77.2 kg 76.5 kg   Examination:  Gen:     Acute on chronically ill appearing male, NAD HEENT: [place]/AT, PERRL, EOM-I and MMM, trach is in a good position Lungs:   Coarse BS diffusely CV:   RRR, Nl S1/S2 and -M/R/G Abd:  Soft, NT, ND and +BS Ext: -edema and -tenderness Skin:  Warm and dry, no rash or wounds Neuro: Gag intact but not much else  I reviewed CXR myself, trach is in a good position  Assessment & Plan:   Acute hypoxemic respiratory failure  s/p trach Obstructive sleep apnea, noncompliant with treatment at baseline Anoxic encephalopathy.   - Trach care per protocol - Maintain a cuffed trach for now given overnight events - Maintain on TC as tolerated - Fluid balance to even at this point - Trach placed 11/20. Suture removal 12/2 - No PMV or capping  Discussed with PCCM-NP  Best practice:  Diet: Tube feeds at goal Pain/Anxiety/Delirium protocol (if indicated): Home meds VAP protocol (if indicated): In place DVT prophylaxis: Subcu heparin-held since 19th, scd per primary GI prophylaxis: Continue PPI Glucose control: SSI Mobility: BR Code Status: full code  Family Communication: Per primary Disposition: ICU, awaiting placement  Rush Farmer, M.D. [facility] Pulmonary/Critical Care Medicine.

## 2019-07-18 NOTE — Progress Notes (Signed)
Pt has thrush on tongue, new order placed.

## 2019-07-18 NOTE — Progress Notes (Signed)
PROGRESS NOTE    Derek Harrell  QHU:[id number] DOB: 11-Nov-1945 DOA: 06/06/2019 PCP: [name], MD   Brief Narrative: 17yom with Parkinson's dementia, prior CVA, hypertension, hyperlipidemia, RLS, OSA( did not tolerate CPAP), gait disorder , AAA that was repaired with abdominal aortic endovascular stent graft 06/02/19 and 3 days psot op came to ER with fever, generalized weakness, altered mental status, hypotension, was admitted to ICU, had bradycardic arrest/3 degree heart block from mucous plugging on 11/3 intubated subsequently extubated 11/4- transferred to telemetry and sustained PEA cardiac arrest on 11/6 , RN alerted by VT in tele, CPR with ROSC then was placed on TTM subsequently underwent trach and PEG placement 11/20.  11/2 >>Admitted to ICU. 11/3 >>Bradycardic arrest from mucus plugging.  11/4 >> Extubated. 11/5 >> Transferred to tele/ [facility]. 11/6 >> PEA Cardiac Arrest, ROSC after 12 minutes >> began TTM to 36. 11/7 >>Re-warming 11/7 completed at noon.  11/20>>Trach, PEG by surgery. Had bleeding post procedure. 11/24>>Transfer to hospitalist service. 12/11>>Tolerating TC. 12/14>>72 hrs off the vent.   Subjective: Patient seen this am. Responding to pain with eye blink. Has been tolerating weaning to TC.  Assessment & Plan:  Acute hypoxic respiratory failure status post trach with history of OSA noncompliant with treatment at baseline/Anoxic encephalopathy:  Patient on trach collar, weaning, continue as per PCCM.  Continued trach collar secretion management. Chest x-ray 12/13-no acute abnormality.  But given excessive secretion, was started on cefepime 12/13. No PMV or capping.Continue secretion management with [place].  No fever.  WBC count stable at 10.5k. MRI consistent with hypoxic encephalopathy.Overall very poor prognosis.  Increased tracheal secretions chest x-ray unremarkable k 12/13.  Patient was placed on cefepime 12/13.  Continue x5 days.  Bradycardic  arrest 11/3, cardiac arrest 11/6 overall poor prognosis.  Heart rate mostly in 60s 70s intermittently low in 40s sinus bradycardia-area if persistent c/s cardio eval again.  Continue to monitor closely.  Hypernatremia/dehydration: Sodium down to 149.  Completed D5 water, continue free water for 400 cc every 6 hours and wean slowly.  Nutrition continue PEG tube feeding 3 times a [place], prostat, 200 mL free water flush every 8 hours.  Hypertensive emergency: BP well controlled.  Currently on 2.5 mg amlodipine which was reduced from 10 mg 3 days ago.Monitor.  Abdominal aortic aneurysm S/P repair 10/29 by vascular.On Crestor.  Dementia: Currently with anoxic encephalopathy, unresponsive. Cont carbidopa/levodopa.  Anemia of chronic disease: Hb stable 8 to 9 g.  Goals of care: Remains full code.  Seen by palliative care  Nutrition: Nutrition Problem: Inadequate oral intake Etiology: inability to eat Signs/Symptoms: NPO status Interventions: Tube feeding, Prostat  Pressure Ulcer: Pressure Injury 06/06/19 Sacrum Mid;Left Unstageable - Full thickness tissue loss in which the base of the ulcer is covered by slough (yellow, tan, gray, green or brown) and/or eschar (tan, brown or black) in the wound bed. unstageable when assessed on 1 (Active)  06/06/19 2032  Location: Sacrum  Location Orientation: Mid;Left  Staging: Unstageable - Full thickness tissue loss in which the base of the ulcer is covered by slough (yellow, tan, gray, green or brown) and/or eschar (tan, brown or black) in the wound bed.  Wound Description (Comments): unstageable when assessed on 12/7  Present on Admission: Yes     Pressure Injury 07/07/19 Neck Mid Stage II -  Partial thickness loss of dermis presenting as a shallow open ulcer with a red, pink wound bed without slough. yellow shallow penny sized (Active)  07/07/19 1200  Location:  Neck  Location Orientation: Mid  Staging: Stage II -  Partial thickness loss of dermis  presenting as a shallow open ulcer with a red, pink wound bed without slough.  Wound Description (Comments): yellow shallow penny sized  Present on Admission:     Body mass index is 22.87 kg/m.   DVT prophylaxis: SCD/lovenox Code Status:full code  Family Communication: plan of care discussed with nursing at bedside.  No wife at bedside.  Patient reports wife has been coming to the bedside every [place], was seen by palliative care and has wished to continue with current full aggressive care, full code.   Disposition Plan: Remains inpatient for manage of acute hypoxic respiratory failure and anoxic  encephalopathy.  Consultants: PCCM,Palliative  Procedures: CT head 11/2 >> Stable white matter disease.  MRI brain 11/9 >>Restricted diffusion in the occipital lobes bilaterally and in the frontal parietal lobes over the convexity bilaterally compatible with acute or subacute infarction. Atrophy and extensive chronic ischemic change in the brain. Multiple areas of chronic microhemorrhage which have progressed since 2017. Microbiology: Blood cultures 11/2>>negative  SARS Cov2 11/2 >> negative. Blood cultures 11/6 >>negative  Urine culture 11/6 >> negative Tracheal aspirate 11/6 >> negative Antimicrobials: Anti-infectives (From admission, onward)   Start     Dose/Rate Route Frequency Ordered Stop   07/17/19 1400  ceFEPIme (MAXIPIME) 2 g in sodium chloride 0.9 % 100 mL IVPB     2 g 200 mL/hr over 30 Minutes Intravenous Every 8 hours 07/17/19 1157     06/14/19 0600  vancomycin (VANCOCIN) 1,500 mg in sodium chloride 0.9 % 500 mL IVPB  Status:  Discontinued     1,500 mg 250 mL/hr over 120 Minutes Intravenous Every 24 hours 06/13/19 1207 06/13/19 1220   06/13/19 1800  vancomycin (VANCOCIN) 1,500 mg in sodium chloride 0.9 % 500 mL IVPB  Status:  Discontinued     1,500 mg 250 mL/hr over 120 Minutes Intravenous Every 24 hours 06/13/19 1220 06/14/19 0944   06/10/19 0615  vancomycin (VANCOCIN)  IVPB 1000 mg/200 mL premix  Status:  Discontinued     1,000 mg 200 mL/hr over 60 Minutes Intravenous Every 12 hours 06/10/19 0605 06/13/19 1207   06/10/19 0615  ceFEPIme (MAXIPIME) 2 g in sodium chloride 0.9 % 100 mL IVPB  Status:  Discontinued     2 g 200 mL/hr over 30 Minutes Intravenous Every 8 hours 06/10/19 0605 06/14/19 0944       Objective: Vitals:   07/18/19 0941 07/18/19 1146 07/18/19 1152 07/18/19 1200  BP: 117/68   119/67  Pulse:  79  (!) 40  Resp:  15  18  Temp:   98.9 F (37.2 C)   TempSrc:   Oral   SpO2:  100%  100%  Weight:      Height:        Intake/Output Summary (Last 24 hours) at 07/18/2019 1244 Last data filed at 07/18/2019 1000 Gross per 24 hour  Intake 2666.63 ml  Output 1350 ml  Net 1316.63 ml   Filed Weights   07/16/19 0500 07/17/19 0455 07/18/19 0433  Weight: 75.2 kg 77.2 kg 76.5 kg   Weight change: -0.7 kg  Body mass index is 22.87 kg/m.  Intake/Output from previous [place]: 12/13 0701 - 12/14 0700 In: 3866.6 [I.V.:869.9; NG/GT:1600; IV Piggyback:196.8] Out: 1750 [Urine:1750] Intake/Output this shift: Total I/O In: -  Out: 300 [Urine:300]  Examination:  General exam: Response to pain , no meaningful response, on trach collar HEENT:Oral mucosa moist,  Ear/Nose WNL grossly, Trach collar+ Respiratory system: Scattered crackles on Trach Collar Cardiovascular system: S1 & S2 +, No JVD,. Gastrointestinal system: Abdomen soft, NT,ND, BS+ Nervous System: Unresponsive.  Some eye flickering movement with pain extremities: No edema, distal peripheral pulses palpable.  Skin: No rashes,no icterus.  MSK: Normal muscle bulk,tone, power  Medications:  Scheduled Meds: . amLODipine  2.5 mg Per J Tube Daily  . amLODipine  2.5 mg Per J Tube Once  . budesonide (PULMICORT) nebulizer solution  0.25 mg Nebulization BID  . Carbidopa-Levodopa ER  1 tablet Per Tube 5 X Daily  . chlorhexidine gluconate (MEDLINE KIT)  15 mL Mouth Rinse BID  . Chlorhexidine  Gluconate Cloth  6 each Topical Daily  . collagenase   Topical Daily  . docusate  50 mg Per Tube Daily  . enoxaparin (LOVENOX) injection  40 mg Subcutaneous Q24H  . feeding supplement (OSMOLITE 1.5 CAL)  474 mL Per Tube TID  . feeding supplement (PRO-STAT SUGAR FREE 64)  30 mL Per Tube BID  . free water  400 mL Per Tube Q6H  . insulin aspart  0-15 Units Subcutaneous TID WC  . ipratropium-albuterol  3 mL Nebulization Q6H  . mouth rinse  15 mL Mouth Rinse 10 times per [place]  . nutrition supplement (JUVEN)  1 packet Per Tube BID BM  . pantoprazole sodium  40 mg Per Tube Daily  . Pimavanserin Tartrate  34 mg Per Tube Daily  . rosuvastatin  10 mg Per Tube q1800   Continuous Infusions: . sodium chloride Stopped (06/10/19 0000)  . sodium chloride Stopped (06/21/19 1744)  . ceFEPime (MAXIPIME) IV 2 g (07/18/19 0618)    Data Reviewed: I have personally reviewed following labs and imaging studies  CBC: Recent Labs  Lab 07/12/19 0320 07/14/19 0403 07/15/19 0610 07/16/19 0241 07/17/19 1130 07/18/19 0454  WBC 10.3 14.0* 11.7* 11.5*  --  10.5  NEUTROABS 7.9*  --   --   --   --  7.1  HGB 8.9* 9.1* 8.4* 8.7* 8.5* 8.8*  HCT 28.2* 29.4* 26.5* 27.3* 25.0* 29.7*  MCV 83.2 86.2 83.3 84.3  --  89.5  PLT 192 220 255 279  --  638   Basic Metabolic Panel: Recent Labs  Lab 07/12/19 0320 07/14/19 0704 07/15/19 0610 07/16/19 0241 07/17/19 1130 07/18/19 0454  NA 139 142 144 149* 151* 149*  K 4.3 4.5 4.3 4.3 3.5 4.7  CL 104 106 106 111  --  113*  CO2 '26 25 27 27  '$ --  26  GLUCOSE 120* 109* 118* 106*  --  102*  BUN 52* 60* 71* 68*  --  63*  CREATININE 0.90 0.91 0.96 0.76  --  0.86  CALCIUM 8.9 8.6* 9.0 8.8*  --  9.0  MG  --   --  2.4  --   --  2.4  PHOS  --   --   --   --   --  4.2   GFR: Estimated Creatinine Clearance: 85.2 mL/min (by C-G formula based on SCr of 0.86 mg/dL). Liver Function Tests: Recent Labs  Lab 07/12/19 0320 07/18/19 0454  AST 33  --   ALT 8  --   ALKPHOS 77  --    BILITOT 0.6  --   PROT 6.9  --   ALBUMIN 2.3* 2.0*   No results for input(s): LIPASE, AMYLASE in the last 168 hours. No results for input(s): AMMONIA in the last 168 hours. Coagulation Profile: No results  for input(s): INR, PROTIME in the last 168 hours. Cardiac Enzymes: No results for input(s): CKTOTAL, CKMB, CKMBINDEX, TROPONINI in the last 168 hours. BNP (last 3 results) No results for input(s): PROBNP in the last 8760 hours. HbA1C: No results for input(s): HGBA1C in the last 72 hours. CBG: Recent Labs  Lab 07/17/19 1128 07/17/19 1606 07/17/19 2333 07/18/19 0734 07/18/19 1136  GLUCAP 125* 105* 101* 82 118*   Lipid Profile: No results for input(s): CHOL, HDL, LDLCALC, TRIG, CHOLHDL, LDLDIRECT in the last 72 hours. Thyroid Function Tests: No results for input(s): TSH, T4TOTAL, FREET4, T3FREE, THYROIDAB in the last 72 hours. Anemia Panel: No results for input(s): VITAMINB12, FOLATE, FERRITIN, TIBC, IRON, RETICCTPCT in the last 72 hours. Sepsis Labs: No results for input(s): PROCALCITON, LATICACIDVEN in the last 168 hours.  Recent Results (from the past 240 hour(s))  Culture, respiratory     Status: None (Preliminary result)   Collection Time: 07/17/19  3:03 PM   Specimen: SPU  Result Value Ref Range Status   Specimen Description SPUTUM DEEP SUCTION  Final   Special Requests NONE  Final   Gram Stain   Final    FEW SQUAMOUS EPITHELIAL CELLS PRESENT MODERATE WBC PRESENT, PREDOMINANTLY PMN FEW GRAM POSITIVE RODS RARE GRAM POSITIVE COCCI RARE GRAM NEGATIVE RODS    Culture   Final    CULTURE REINCUBATED FOR BETTER GROWTH Performed at [facility], [place] [address], [place], [place] [address]    Report Status PENDING  Incomplete      Radiology Studies: DG CHEST PORT 1 VIEW  Result Date: 07/17/2019 CLINICAL DATA:  Tracheitis EXAM: PORTABLE CHEST 1 VIEW COMPARISON:  07/10/2019 FINDINGS: Cardiomegaly. Tracheostomy. Both lungs are clear. The visualized skeletal  structures are unremarkable. IMPRESSION: 1.  Tracheostomy without acute abnormality of the lungs. 2.  Cardiomegaly. Electronically Signed   By: Eddie Candle M.D.   On: 07/17/2019 13:53      LOS: 42 days   Time spent: More than 50% of that time was spent in counseling and/or coordination of care.  Antonieta Pert, MD [facility]  07/18/2019, 12:44 PM

## 2019-07-19 LAB — BASIC METABOLIC PANEL
Anion gap: 9 (ref 5–15)
BUN: 55 mg/dL — ABNORMAL HIGH (ref 8–23)
CO2: 26 mmol/L (ref 22–32)
Calcium: 9.1 mg/dL (ref 8.9–10.3)
Chloride: 110 mmol/L (ref 98–111)
Creatinine, Ser: 0.69 mg/dL (ref 0.61–1.24)
GFR calc Af Amer: >60 mL/min (ref >60)
GFR calc non Af Amer: >60 mL/min (ref >60)
Glucose, Bld: 102 mg/dL — ABNORMAL HIGH (ref 70–99)
Potassium: 4.4 mmol/L (ref 3.5–5.1)
Sodium: 145 mmol/L (ref 135–145)

## 2019-07-19 LAB — CBC
HCT: 31 % — ABNORMAL LOW (ref 39.0–52.0)
Hemoglobin: 9.5 g/dL — ABNORMAL LOW (ref 13.0–17.0)
MCH: 26.4 pg (ref 26.0–34.0)
MCHC: 30.6 g/dL (ref 30.0–36.0)
MCV: 86.1 fL (ref 80.0–100.0)
Platelets: 272 10*3/uL (ref 150–400)
RBC: 3.6 MIL/uL — ABNORMAL LOW (ref 4.22–5.81)
RDW: 14.7 % (ref 11.5–15.5)
WBC: 10.8 10*3/uL — ABNORMAL HIGH (ref 4.0–10.5)
nRBC: 0 % (ref 0.0–0.2)

## 2019-07-19 LAB — GLUCOSE, CAPILLARY
Glucose-Capillary: 75 mg/dL (ref 70–99)
Glucose-Capillary: 103 mg/dL — ABNORMAL HIGH (ref 70–99)
Glucose-Capillary: 126 mg/dL — ABNORMAL HIGH (ref 70–99)
Glucose-Capillary: 161 mg/dL — ABNORMAL HIGH (ref 70–99)

## 2019-07-19 LAB — PHOSPHORUS: Phosphorus: 4.1 mg/dL (ref 2.5–4.6)

## 2019-07-19 LAB — MAGNESIUM: Magnesium: 2.3 mg/dL (ref 1.7–2.4)

## 2019-07-19 MED ORDER — FREE WATER
200.0000 mL | Freq: Four times a day (QID) | Status: DC
  Administered 2019-07-19 – 2019-07-22 (×12): 200 mL

## 2019-07-19 MED ORDER — IPRATROPIUM-ALBUTEROL 0.5-2.5 (3) MG/3ML IN SOLN
3.0000 mL | Freq: Two times a day (BID) | RESPIRATORY_TRACT | Status: DC
  Administered 2019-07-19 – 2019-07-25 (×12): 3 mL via RESPIRATORY_TRACT
  Filled 2019-07-19 (×12): qty 3

## 2019-07-19 NOTE — Progress Notes (Signed)
PROGRESS NOTE    Derek Harrell  SWN:[id number] DOB: [birth year] DOA: 06/06/2019 PCP: [name], MD   Brief Narrative: 88yom with Parkinson's dementia, prior CVA, hypertension, hyperlipidemia, RLS, OSA( did not tolerate CPAP), gait disorder , AAA that was repaired with abdominal aortic endovascular stent graft 06/02/19 and 3 days psot op came to ER with fever, generalized weakness, altered mental status, hypotension, was admitted to ICU, had bradycardic arrest/3 degree heart block from mucous plugging on 11/3 intubated subsequently extubated 11/4- transferred to telemetry and sustained PEA cardiac arrest on 11/6 , RN alerted by VT in tele, CPR with ROSC then was placed on TTM subsequently underwent trach and PEG placement 11/20.  11/2 >>Admitted to ICU. 11/3 >>Bradycardic arrest from mucus plugging.  11/4 >> Extubated. 11/5 >> Transferred to tele/ TRH. 11/6 >> PEA Cardiac Arrest, ROSC after 12 minutes >> began TTM to 36. 11/7 >>Re-warming 11/7 completed at noon.  11/20>>Trach, PEG by surgery. Had bleeding post procedure. 11/24>>Transfer to hospitalist service. 12/11>>Tolerating TC. 12/14>>72 hrs off the vent.  AND TOLERATING TRACH COLLAR AT 5L  Subjective: Patient seen this am. No fever overnight. Noticed to have oral thrush and placed on Diflucan 12/14 Continues on trach collar at 5 L Intermittently Yawning this am, otherwise no meaningful response  Assessment & Plan:  Acute hypoxic respiratory failure status post trach with history of OSA noncompliant with treatment at baseline/Anoxic encephalopathy:  Patient on trach collar and doing well with weaning sinc 12/14. continue as per PCCM.  Continued trach collar secretion management. Chest x-ray 12/13-no acute abnormality.  But given excessive secretion, was started on cefepime 12/13.No fever.  WBC count stable at 10k. MRI consistent with hypoxic encephalopathy.Overall very poor prognosis.  Increased tracheal secretions chest  x-ray unremarkable k 12/13.  Patient was placed on cefepime 12/13.  Continue x5 days.  Oral Thrush placed on Diflucan 12/14- cont to complete course  Bradycardic arrest 11/3, cardiac arrest 11/6 overall poor prognosis.  Having some PVCs no episode of bradycardia.  Heart rate in 70s.  Monitor- if persistent low threshold for consultation to cardi  Hypernatremia/dehydration: Sodium improved to 145. Completed D5 water, on free water for 400 cc every 6 hours decrease to 200.  Nutrition continue PEG tube feeding 3 times a [place], prostat. adjust free water flush based on needs  Hypertensive emergency: BP well controlled.  Currently on 2.5 mg amlodipine which was reduced from 10 mg few days ago.Monitor.  Abdominal aortic aneurysm S/P repair 10/29 by vascular.On Crestor.  Dementia: Currently with anoxic encephalopathy, unresponsive. Cont carbidopa/levodopa.  Anemia of chronic disease: Hb stable 8 to 9 g.  Goals of care: Remains full code.  Seen by palliative care  Nutrition: Nutrition Problem: Inadequate oral intake Etiology: inability to eat Signs/Symptoms: NPO status Interventions: Tube feeding, Prostat  Pressure Ulcer: Pressure Injury 06/06/19 Sacrum Mid;Left Unstageable - Full thickness tissue loss in which the base of the ulcer is covered by slough (yellow, tan, gray, green or brown) and/or eschar (tan, brown or black) in the wound bed. unstageable when assessed on 1 (Active)  06/06/19 2032  Location: Sacrum  Location Orientation: Mid;Left  Staging: Unstageable - Full thickness tissue loss in which the base of the ulcer is covered by slough (yellow, tan, gray, green or brown) and/or eschar (tan, brown or black) in the wound bed.  Wound Description (Comments): unstageable when assessed on 12/7  Present on Admission: Yes     Pressure Injury 07/07/19 Neck Mid Stage II -  Partial thickness loss  of dermis presenting as a shallow open ulcer with a red, pink wound bed without slough. yellow  shallow penny sized (Active)  07/07/19 1200  Location: Neck  Location Orientation: Mid  Staging: Stage II -  Partial thickness loss of dermis presenting as a shallow open ulcer with a red, pink wound bed without slough.  Wound Description (Comments): yellow shallow penny sized  Present on Admission:     Body mass index is 23.14 kg/m.   DVT prophylaxis: SCD/lovenox Code Status:full code  Family Communication: no family at bedside. Seen by palliative care and wife has wished to continue with current full aggressive care, full code.   Disposition Plan: Remains inpatient for manage of acute hypoxic respiratory failure and anoxic  encephalopathy. Weaning, dispo location per PCCM- SNF. Consultants: PCCM,Palliative  Procedures: CT head 11/2 >> Stable white matter disease.  MRI brain 11/9 >>Restricted diffusion in the occipital lobes bilaterally and in the frontal parietal lobes over the convexity bilaterally compatible with acute or subacute infarction. Atrophy and extensive chronic ischemic change in the brain. Multiple areas of chronic microhemorrhage which have progressed since 2017. Microbiology: Blood cultures 11/2>>negative  SARS Cov2 11/2 >> negative. Blood cultures 11/6 >>negative  Urine culture 11/6 >> negative Tracheal aspirate 11/6 >> negative Antimicrobials: Anti-infectives (From admission, onward)   Start     Dose/Rate Route Frequency Ordered Stop   07/18/19 2200  fluconazole (DIFLUCAN) 40 MG/ML suspension 100 mg     100 mg Per Tube Daily 07/18/19 1946     07/17/19 1400  ceFEPIme (MAXIPIME) 2 g in sodium chloride 0.9 % 100 mL IVPB     2 g 200 mL/hr over 30 Minutes Intravenous Every 8 hours 07/17/19 1157     06/14/19 0600  vancomycin (VANCOCIN) 1,500 mg in sodium chloride 0.9 % 500 mL IVPB  Status:  Discontinued     1,500 mg 250 mL/hr over 120 Minutes Intravenous Every 24 hours 06/13/19 1207 06/13/19 1220   06/13/19 1800  vancomycin (VANCOCIN) 1,500 mg in sodium  chloride 0.9 % 500 mL IVPB  Status:  Discontinued     1,500 mg 250 mL/hr over 120 Minutes Intravenous Every 24 hours 06/13/19 1220 06/14/19 0944   06/10/19 0615  vancomycin (VANCOCIN) IVPB 1000 mg/200 mL premix  Status:  Discontinued     1,000 mg 200 mL/hr over 60 Minutes Intravenous Every 12 hours 06/10/19 0605 06/13/19 1207   06/10/19 0615  ceFEPIme (MAXIPIME) 2 g in sodium chloride 0.9 % 100 mL IVPB  Status:  Discontinued     2 g 200 mL/hr over 30 Minutes Intravenous Every 8 hours 06/10/19 0605 06/14/19 0944       Objective: Vitals:   07/19/19 0748 07/19/19 0800 07/19/19 0900 07/19/19 1122  BP:  134/84 126/84   Pulse:  72 73 92  Resp:  12 14 (!) 21  Temp: (!) 97.3 F (36.3 C)     TempSrc: Axillary     SpO2:  100% 100% 100%  Weight:      Height:        Intake/Output Summary (Last 24 hours) at 07/19/2019 1129 Last data filed at 07/19/2019 3151 Gross per 24 hour  Intake 844 ml  Output 2320 ml  Net -1476 ml   Filed Weights   07/17/19 0455 07/18/19 0433 07/19/19 0456  Weight: 77.2 kg 76.5 kg 77.4 kg   Weight change: 0.9 kg  Body mass index is 23.14 kg/m.  Intake/Output from previous [place]: 12/14 0701 - 12/15 0700 In: 460 [  I.V.:110; IV Piggyback:350] Out: 2345 [Urine:2345] Intake/Output this shift: Total I/O In: 484 [I.V.:10; NG/GT:474] Out: 275 [Urine:275]  Examination:  General exam: On trach collar, not in distress, no meaningful response. HEENT:Oral mucosa moist, Ear/Nose WNL grossly, Trach collar+ Respiratory system: Diminished breath sounds bilaterally , on trach collar Cardiovascular system: S1 & S2 +, No JVD,. Gastrointestinal system: Abdomen soft, NT,ND, BS+ Nervous System: Unresponsive.  Intermittent flickering of eyes, intermittently yawning Extremities: mild b/l UE edema, distal peripheral pulses palpable.  Skin: No rashes,no icterus.  MSK: Normal muscle bulk,tone, power  Medications:  Scheduled Meds: . amLODipine  2.5 mg Per J Tube Daily  .  amLODipine  2.5 mg Per J Tube Once  . budesonide (PULMICORT) nebulizer solution  0.25 mg Nebulization BID  . Carbidopa-Levodopa ER  1 tablet Per Tube 5 X Daily  . chlorhexidine gluconate (MEDLINE KIT)  15 mL Mouth Rinse BID  . Chlorhexidine Gluconate Cloth  6 each Topical Daily  . collagenase   Topical Daily  . docusate  50 mg Per Tube Daily  . enoxaparin (LOVENOX) injection  40 mg Subcutaneous Q24H  . feeding supplement (OSMOLITE 1.5 CAL)  474 mL Per Tube TID  . feeding supplement (PRO-STAT SUGAR FREE 64)  30 mL Per Tube BID  . fluconazole  100 mg Per Tube Daily  . free water  400 mL Per Tube Q6H  . insulin aspart  0-15 Units Subcutaneous TID WC  . ipratropium-albuterol  3 mL Nebulization Q6H  . mouth rinse  15 mL Mouth Rinse 10 times per [place]  . nutrition supplement (JUVEN)  1 packet Per Tube BID BM  . pantoprazole sodium  40 mg Per Tube Daily  . Pimavanserin Tartrate  34 mg Per Tube Daily  . rosuvastatin  10 mg Per Tube q1800   Continuous Infusions: . sodium chloride Stopped (06/10/19 0000)  . sodium chloride 10 mL/hr at 07/19/19 0610  . ceFEPime (MAXIPIME) IV Stopped (07/19/19 0538)    Data Reviewed: I have personally reviewed following labs and imaging studies  CBC: Recent Labs  Lab 07/14/19 0403 07/15/19 0610 07/16/19 0241 07/17/19 1130 07/18/19 0454 07/19/19 0709  WBC 14.0* 11.7* 11.5*  --  10.5 10.8*  NEUTROABS  --   --   --   --  7.1  --   HGB 9.1* 8.4* 8.7* 8.5* 8.8* 9.5*  HCT 29.4* 26.5* 27.3* 25.0* 29.7* 31.0*  MCV 86.2 83.3 84.3  --  89.5 86.1  PLT 220 255 279  --  287 254   Basic Metabolic Panel: Recent Labs  Lab 07/14/19 0704 07/15/19 0610 07/16/19 0241 07/17/19 1130 07/18/19 0454 07/19/19 0709  NA 142 144 149* 151* 149* 145  K 4.5 4.3 4.3 3.5 4.7 4.4  CL 106 106 111  --  113* 110  CO2 _0 --  26 26  GLUCOSE 109* 118* 106*  --  102* 102*  BUN 60* 71* 68*  --  63* 55*  CREATININE 0.91 0.96 0.76  --  0.86 0.69  CALCIUM 8.6* 9.0 8.8*  --   9.0 9.1  MG  --  2.4  --   --  2.4 2.3  PHOS  --   --   --   --  4.2 4.1   GFR: Estimated Creatinine Clearance: 92.7 mL/min (by C-G formula based on SCr of 0.69 mg/dL). Liver Function Tests: Recent Labs  Lab 07/18/19 0454  ALBUMIN 2.0*   No results for input(s): LIPASE, AMYLASE in the last 168  hours. No results for input(s): AMMONIA in the last 168 hours. Coagulation Profile: No results for input(s): INR, PROTIME in the last 168 hours. Cardiac Enzymes: No results for input(s): CKTOTAL, CKMB, CKMBINDEX, TROPONINI in the last 168 hours. BNP (last 3 results) No results for input(s): PROBNP in the last 8760 hours. HbA1C: No results for input(s): HGBA1C in the last 72 hours. CBG: Recent Labs  Lab 07/18/19 1136 07/18/19 1555 07/18/19 2228 07/19/19 0747 07/19/19 1112  GLUCAP 118* 154* 159* 75 103*   Lipid Profile: No results for input(s): CHOL, HDL, LDLCALC, TRIG, CHOLHDL, LDLDIRECT in the last 72 hours. Thyroid Function Tests: No results for input(s): TSH, T4TOTAL, FREET4, T3FREE, THYROIDAB in the last 72 hours. Anemia Panel: No results for input(s): VITAMINB12, FOLATE, FERRITIN, TIBC, IRON, RETICCTPCT in the last 72 hours. Sepsis Labs: No results for input(s): PROCALCITON, LATICACIDVEN in the last 168 hours.  Recent Results (from the past 240 hour(s))  Culture, respiratory     Status: None (Preliminary result)   Collection Time: 07/17/19  3:03 PM   Specimen: SPU  Result Value Ref Range Status   Specimen Description SPUTUM DEEP SUCTION  Final   Special Requests NONE  Final   Gram Stain   Final    FEW SQUAMOUS EPITHELIAL CELLS PRESENT MODERATE WBC PRESENT, PREDOMINANTLY PMN FEW GRAM POSITIVE RODS RARE GRAM POSITIVE COCCI RARE GRAM NEGATIVE RODS    Culture   Final    CULTURE REINCUBATED FOR BETTER GROWTH Performed at [facility], [place] [address], [place], [place] [address]    Report Status PENDING  Incomplete      Radiology Studies: DG CHEST PORT 1  VIEW  Result Date: 07/17/2019 CLINICAL DATA:  Tracheitis EXAM: PORTABLE CHEST 1 VIEW COMPARISON:  07/10/2019 FINDINGS: Cardiomegaly. Tracheostomy. Both lungs are clear. The visualized skeletal structures are unremarkable. IMPRESSION: 1.  Tracheostomy without acute abnormality of the lungs. 2.  Cardiomegaly. Electronically Signed   By: Eddie Candle M.D.   On: 07/17/2019 13:53      LOS: 43 days   Time spent: More than 50% of that time was spent in counseling and/or coordination of care.  Antonieta Pert, MD [facility]  07/19/2019, 11:29 AM

## 2019-07-20 LAB — GLUCOSE, CAPILLARY
Glucose-Capillary: 100 mg/dL — ABNORMAL HIGH (ref 70–99)
Glucose-Capillary: 102 mg/dL — ABNORMAL HIGH (ref 70–99)
Glucose-Capillary: 106 mg/dL — ABNORMAL HIGH (ref 70–99)
Glucose-Capillary: 118 mg/dL — ABNORMAL HIGH (ref 70–99)
Glucose-Capillary: 129 mg/dL — ABNORMAL HIGH (ref 70–99)
Glucose-Capillary: 93 mg/dL (ref 70–99)

## 2019-07-20 LAB — CULTURE, RESPIRATORY W GRAM STAIN

## 2019-07-20 NOTE — Progress Notes (Signed)
Patient ID: Derek Harrell, male   DOB: Sep 23, 1945, 73 y.o.   MRN: [id number]  PROGRESS NOTE    Derek Harrell  AST:[id number] DOB: [birth year] DOA: 06/06/2019 PCP: [name], MD   Brief Narrative:  73 year old male with history of Parkinson's dementia, hypertension, hyperlipidemia, OSA (did not tolerate CPAP), AAA, history of recent endovascular repair of aortic aneurysm 3 days prior to presentation on 06/02/2019, prior unspecified CVA, cellulitis, restless leg syndrome presented on 06/06/2019 with worsening weakness, mental status change, hypertension and fever.  He was admitted to ICU, developed third-degree heart block with agonal respirations with thick mucus suctioned requiring intubation.  He was extubated on 06/08/2019.  Transferred out to the floor on 06/09/2019.  On 06/10/2019 AM, he suffered cardiac arrest requiring ACLS and intubation.  He was transferred back to ICU.  Post rewarming, patient has had no improvement in mental status with MRI consistent with hypoxic encephalopathy.  He is status post tracheostomy/PEG tube placement on 06/24/2019.   He was transferred to [facility] service on 06/28/2019.  PCCM will keep following for tracheostomy and vent.  Palliative care has evaluated the patient.  Patient remains full code.  Patient is recently tolerating trach collar.  Assessment & Plan:   Acute hypoxic respiratory failure status post intubation and subsequent tracheostomy on a ventilator Concern for aspiration event with ongoing issues with swallowing and secretion management Obstructive sleep apnea, noncompliant with treatment at baseline Status post PEG tube placement -Patient has had a long hospitalization and could not be liberated from the ventilator because of mental status changes.  Subsequently tracheostomy and PEG tube were placed on 06/24/2019.  Transferred to [facility] service on 06/28/2019.  PCCM will keep following for trach management. -Patient is currently on trach collar and doing well with  weaning since 07/18/2019. -Chest x-ray on 07/17/2019 showed no acute abnormality: He was started on cefepime on 07/17/2019 for excessive secretions but no history of fever or leukocytosis.  Will DC cefepime. -Continue tube feeding as per dietitian recommendations. -Currently afebrile and hemodynamically stable.  Cardiac arrest, unclear etiology Intermittent bradycardia -Status post bradycardic arrest lasting for about 12 minutes on 06/10/2019.  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 -Palliative care has had multiple discussions with the family.  Patient remains full code.  Overall prognosis is poor.  Consider hospice/comfort measures.  Oral thrush -Started on Diflucan on 07/18/2019.  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  Continue Norvasc  Acute on chronic anemia of chronic disease -Transfused 2 units of packed red cells on 06/26/2019.  Monitor hemoglobin.  Transfuse if hemoglobin less than 7.  Hemoglobin has been stable recently.  Hypernatremia -Monitor.  Improving.  Continue free water via tube.  Abdominal aortic aneurysm -Status post repair on 06/02/2019 by vascular surgery.  Continue Crestor  Pressure Injury 06/06/19 Sacrum Mid;Left Unstageable - Full thickness tissue loss in which the base of the ulcer is covered by slough (yellow, tan, gray, green or brown) and/or eschar (tan, brown or black) in the wound bed. unstageable when assessed on 1 (Active)  06/06/19 2032  Location: Sacrum  Location Orientation: Mid;Left  Staging: Unstageable - Full thickness tissue loss in which the base of the ulcer is covered by slough (yellow, tan, gray, green or brown) and/or eschar (tan, brown or black) in the wound bed.  Wound  Description (Comments): unstageable when assessed on 12/7  Present on Admission: Yes     Pressure Injury 07/07/19 Neck Mid Stage II -  Partial thickness loss of dermis presenting as a shallow open ulcer with a red, pink wound bed without slough. yellow shallow penny sized (Active)  07/07/19 1200  Location: Neck  Location Orientation: Mid  Staging: Stage II -  Partial thickness loss of dermis presenting as a shallow open ulcer with a red, pink wound bed without slough.  Wound Description (Comments): yellow shallow penny sized  Present on Admission:      Pressure Injury 07/20/19 Penis Lower Stage II -  Partial thickness loss of dermis presenting as a shallow open ulcer with a red, pink wound bed without slough.  (Active)  07/20/19 0340  Location: Penis  Location Orientation: Lower  Staging: Stage II -  Partial thickness loss of dermis presenting as a shallow open ulcer with a red, pink wound bed without slough.  Wound Description (Comments): -- (Open wound on lower base of penis)  Present on Admission: No     DVT prophylaxis: SCDs/Lovenox Code Status: Full Family Communication: None at bedside Disposition Plan: Remains inpatient for management of respiratory failure/anoxic encephalopathy.  Will need SNF/LTAC Consultants: PCCM/vascular surgery/palliative care/general surgery/cardiology/neurology  Procedures:  Endotracheal tube: 06/07/2019-06/08/2019; 06/10/2019 onwards Tracheostomy and PEG tube placement on 06/24/2019 CVC left IJ: 06/10/2019-06/15/2019   Antimicrobials:  Anti-infectives (From admission, onward)   Start     Dose/Rate Route Frequency Ordered Stop   07/18/19 2200  fluconazole (DIFLUCAN) 40 MG/ML suspension 100 mg     100 mg Per Tube Daily 07/18/19 1946     07/17/19 1400  ceFEPIme (MAXIPIME) 2 g in sodium chloride 0.9 % 100 mL IVPB     2 g 200 mL/hr over 30 Minutes Intravenous Every 8 hours 07/17/19 1157     06/14/19 0600  vancomycin (VANCOCIN) 1,500 mg in sodium  chloride 0.9 % 500 mL IVPB  Status:  Discontinued     1,500 mg 250 mL/hr over 120 Minutes Intravenous Every 24 hours 06/13/19 1207 06/13/19 1220   06/13/19 1800  vancomycin (VANCOCIN) 1,500 mg in sodium chloride 0.9 % 500 mL IVPB  Status:  Discontinued     1,500 mg 250 mL/hr over 120 Minutes Intravenous Every 24 hours 06/13/19 1220 06/14/19 0944   06/10/19 0615  vancomycin (VANCOCIN) IVPB 1000 mg/200 mL premix  Status:  Discontinued     1,000 mg 200 mL/hr over 60 Minutes Intravenous Every 12 hours 06/10/19 0605 06/13/19 1207   06/10/19 0615  ceFEPIme (MAXIPIME) 2 g in sodium chloride 0.9 % 100 mL IVPB  Status:  Discontinued     2 g 200 mL/hr over 30 Minutes Intravenous Every 8 hours 06/10/19 0605 06/14/19 0944       Subjective: Patient seen and examined at bedside.  Unresponsive.  No overnight fever or vomiting reported.   Objective: Vitals:   07/20/19 0700 07/20/19 0754 07/20/19 0755 07/20/19 0800  BP: 125/79 125/79  135/69  Pulse: 71 88  83  Resp: '15 16  18  '$ Temp:   (!) 97 F (36.1 C)   TempSrc:   Axillary   SpO2: 99% 98%  100%  Weight:      Height:        Intake/Output Summary (Last 24 hours) at 07/20/2019 1038 Last data filed at 07/20/2019 0926 Gross per 24 hour  Intake 2869.83 ml  Output 1710 ml  Net 1159.83 ml   [employer]  07/18/19 0433 07/19/19 0456 07/20/19 0500  Weight: 76.5 kg 77.4 kg 76.5 kg    Examination:  General exam: No acute distress.  Looks chronically ill.  Unresponsive.   Neck: Trach collar present Respiratory system: Bilateral decreased breath sounds at bases with some crackles.  No wheezing  cardiovascular system: Rate controlled, S1-2 heard Gastrointestinal system: Abdomen is nondistended, soft and PEG tube present. Normal bowel sounds heard. Extremities: No cyanosis; trace lower extremity edema   Data Reviewed: I have personally reviewed following labs and imaging studies  CBC: Recent Labs  Lab 07/14/19 0403 07/15/19 0610  07/16/19 0241 07/17/19 1130 07/18/19 0454 07/19/19 0709  WBC 14.0* 11.7* 11.5*  --  10.5 10.8*  NEUTROABS  --   --   --   --  7.1  --   HGB 9.1* 8.4* 8.7* 8.5* 8.8* 9.5*  HCT 29.4* 26.5* 27.3* 25.0* 29.7* 31.0*  MCV 86.2 83.3 84.3  --  89.5 86.1  PLT 220 255 279  --  287 748   Basic Metabolic Panel: Recent Labs  Lab 07/14/19 0704 07/15/19 0610 07/16/19 0241 07/17/19 1130 07/18/19 0454 07/19/19 0709  NA 142 144 149* 151* 149* 145  K 4.5 4.3 4.3 3.5 4.7 4.4  CL 106 106 111  --  113* 110  CO2 '25 27 27  '$ --  26 26  GLUCOSE 109* 118* 106*  --  102* 102*  BUN 60* 71* 68*  --  63* 55*  CREATININE 0.91 0.96 0.76  --  0.86 0.69  CALCIUM 8.6* 9.0 8.8*  --  9.0 9.1  MG  --  2.4  --   --  2.4 2.3  PHOS  --   --   --   --  4.2 4.1   GFR: Estimated Creatinine Clearance: 91.6 mL/min (by C-G formula based on SCr of 0.69 mg/dL). Liver Function Tests: Recent Labs  Lab 07/18/19 0454  ALBUMIN 2.0*   No results for input(s): LIPASE, AMYLASE in the last 168 hours. No results for input(s): AMMONIA in the last 168 hours. Coagulation Profile: No results for input(s): INR, PROTIME in the last 168 hours. Cardiac Enzymes: No results for input(s): CKTOTAL, CKMB, CKMBINDEX, TROPONINI in the last 168 hours. BNP (last 3 results) No results for input(s): PROBNP in the last 8760 hours. HbA1C: No results for input(s): HGBA1C in the last 72 hours. CBG: Recent Labs  Lab 07/19/19 1618 07/19/19 2013 07/20/19 0017 07/20/19 0421 07/20/19 0804  GLUCAP 126* 161* 100* 118* 93   Lipid Profile: No results for input(s): CHOL, HDL, LDLCALC, TRIG, CHOLHDL, LDLDIRECT in the last 72 hours. Thyroid Function Tests: No results for input(s): TSH, T4TOTAL, FREET4, T3FREE, THYROIDAB in the last 72 hours. Anemia Panel: No results for input(s): VITAMINB12, FOLATE, FERRITIN, TIBC, IRON, RETICCTPCT in the last 72 hours. Sepsis Labs: No results for input(s): PROCALCITON, LATICACIDVEN in the last 168  hours.  Recent Results (from the past 240 hour(s))  Culture, respiratory     Status: None   Collection Time: 07/17/19  3:03 PM   Specimen: SPU  Result Value Ref Range Status   Specimen Description SPUTUM DEEP SUCTION  Final   Special Requests NONE  Final   Gram Stain   Final    FEW SQUAMOUS EPITHELIAL CELLS PRESENT MODERATE WBC PRESENT, PREDOMINANTLY PMN FEW GRAM POSITIVE RODS RARE GRAM POSITIVE COCCI RARE GRAM NEGATIVE RODS Performed at [facility], [place] [address], [place], [place] [address]    Culture   Final  FEW CITROBACTER KOSERI RARE STAPHYLOCOCCUS AUREUS    Report Status 07/20/2019 FINAL  Final   Organism ID, Bacteria CITROBACTER KOSERI  Final   Organism ID, Bacteria STAPHYLOCOCCUS AUREUS  Final      Susceptibility   Citrobacter koseri - MIC*    CEFAZOLIN <=4 SENSITIVE Sensitive     CEFEPIME <=1 SENSITIVE Sensitive     CEFTAZIDIME <=1 SENSITIVE Sensitive     CEFTRIAXONE <=1 SENSITIVE Sensitive     CIPROFLOXACIN <=0.25 SENSITIVE Sensitive     GENTAMICIN <=1 SENSITIVE Sensitive     IMIPENEM <=0.25 SENSITIVE Sensitive     TRIMETH/SULFA <=20 SENSITIVE Sensitive     PIP/TAZO <=4 SENSITIVE Sensitive     * FEW CITROBACTER KOSERI   Staphylococcus aureus - MIC*    CIPROFLOXACIN <=0.5 SENSITIVE Sensitive     ERYTHROMYCIN <=0.25 SENSITIVE Sensitive     GENTAMICIN <=0.5 SENSITIVE Sensitive     OXACILLIN 0.5 SENSITIVE Sensitive     TETRACYCLINE <=1 SENSITIVE Sensitive     VANCOMYCIN 1 SENSITIVE Sensitive     TRIMETH/SULFA <=10 SENSITIVE Sensitive     CLINDAMYCIN <=0.25 SENSITIVE Sensitive     RIFAMPIN <=0.5 SENSITIVE Sensitive     Inducible Clindamycin NEGATIVE Sensitive     * RARE STAPHYLOCOCCUS AUREUS         Radiology Studies: No results found.      Scheduled Meds: . amLODipine  2.5 mg Per J Tube Daily  . budesonide (PULMICORT) nebulizer solution  0.25 mg Nebulization BID  . Carbidopa-Levodopa ER  1 tablet Per Tube 5 X Daily  . chlorhexidine  gluconate (MEDLINE KIT)  15 mL Mouth Rinse BID  . Chlorhexidine Gluconate Cloth  6 each Topical Daily  . collagenase   Topical Daily  . docusate  50 mg Per Tube Daily  . enoxaparin (LOVENOX) injection  40 mg Subcutaneous Q24H  . feeding supplement (OSMOLITE 1.5 CAL)  474 mL Per Tube TID  . feeding supplement (PRO-STAT SUGAR FREE 64)  30 mL Per Tube BID  . fluconazole  100 mg Per Tube Daily  . free water  200 mL Per Tube Q6H  . insulin aspart  0-15 Units Subcutaneous TID WC  . ipratropium-albuterol  3 mL Nebulization BID  . mouth rinse  15 mL Mouth Rinse 10 times per [place]  . nutrition supplement (JUVEN)  1 packet Per Tube BID BM  . pantoprazole sodium  40 mg Per Tube Daily  . Pimavanserin Tartrate  34 mg Per Tube Daily  . rosuvastatin  10 mg Per Tube q1800   Continuous Infusions: . sodium chloride Stopped (06/10/19 0000)  . sodium chloride Stopped (07/20/19 0730)  . ceFEPime (MAXIPIME) IV Stopped (07/20/19 0544)          Aline August, MD [facility] 07/20/2019, 10:38 AM

## 2019-07-20 NOTE — TOC Progression Note (Signed)
Transition of Care [facility]) - Progression Note    Patient Details  Name: Derek Harrell MRN: [id number] Date of Birth: [birth year]  Transition of Care [facility]) CM/SW Contact  Bartholomew Crews, RN Phone Number: [phone] 07/20/2019, 3:16 PM  Clinical Narrative:    Spoke with patient's spouse on the phone.   Spouse has been in contact with DSS concerning medicaid application, and spouse was advised by DSS that look back was in progress.   Spouse is also working toward guardianship, but states with the pandemic and holidays that there are delays with court system. She also stated that the courts close every [place] at 3pm. Spouse is working with an attorney concerning guardianship.  Spouse stated that she called the customer service number for [facility] and was advised that patient could have a "nurse" for up to 8 hours a Maur[place] for up to 35 hours per week. NCM contacted [employer] to discuss benefits, and patient does not have home care benefits, but does have [place] benefits with skilled need. Discussed benefit and how [place] works with spouse, and she verbalized understanding.   Spouse has also reached out to [place] stating that in 2017 spouse had a primary care, eye, and dental appointment, but was denied additional services and follow up d/t income was too high.   Revisited discussion about [facility]. Spouse is willing to discuss her options with liaison. Advised that talking to liaison was not making a commitment, and spouse verbalized understanding.   Spoke with liaison at [facility] to place referral. Liaison to discuss with team d/t patient complexity.   Spoke with ICU RN. Patient continues to be on trach collar at 5 L. He has been off the ventilator x 5 days. Requires suctioning 5-6 times in a 12 hour shift. Lurline Idol is cuffed. Tolerating bolus feeds.   TOC team continuing to follow for transition needs.     Expected Discharge Plan: Long Term Nursing Home Barriers to Discharge: Continued Medical Work  up  Expected Discharge Plan and Services Expected Discharge Plan: [place] In-house Referral: Clinical Social Work Discharge Planning Services: CM Consult   Living arrangements for the past 2 months: [place] Determinants of Health (SDOH) Interventions    Readmission Risk Interventions Readmission Risk Prevention Plan 06/03/2019  Transportation Screening Complete  PCP or Specialist Appt within 3-5 Days Complete  HRI or [place] Complete  Social Work Consult for [place] Planning/Counseling Complete  Palliative Care Screening Not Applicable  Medication Review [employer]) Complete  Some recent data might be hidden

## 2019-07-20 NOTE — Progress Notes (Signed)
Nutrition Follow-up  DOCUMENTATION CODES:   Not applicable  INTERVENTION:   Continue Tube feeding:  Bolus feedings of Osmolite 1.5 474 mL (2cans) TID Pro-Stat 30 mL BID Provides 2330 kcals, 119 g of protein and 1086 mL of free water   Continue Juven BID, each packet provides 80 calories, 8 grams of carbohydrate, 2.5  grams of protein (collagen), 7 grams of L-arginine and 7 grams of L-glutamine; supplement contains CaHMB, Vitamins C, E, B12 and Zinc to promote wound healing   NUTRITION DIAGNOSIS:   Inadequate oral intake related to inability to eat as evidenced by NPO status.  Being addressed via TF   GOAL:   Patient will meet greater than or equal to 90% of their needs  Progressing  MONITOR:   Vent status, Labs, Weight trends, TF tolerance, Skin, I & O's, Other (Comment)(GOC)  REASON FOR ASSESSMENT:   Ventilator, Consult Enteral/tube feeding initiation and management  ASSESSMENT:   73 yo male admitted with worsening weakness, MS change, HTN, fever. PMH includes HTN, AAA, HLD, CVD, obesity, stroke, myasthenia gravis, RLS, Parkinson's disease, dementia.  11/02 - admit 11/03 - Cortrak placed, tip gastric per Cortrak team 11/04 - extubated 11/06 - cardiac arrest, reintubated, TTM 36 11/20 - Trach and PEG placed  Patient now on trach collar; no longer requiring vent support.  Tolerating Osmolite 1.5 boluses via G-tube. Receiving 200 ml free water flushes every 6 hours.  Recent weights have been stable  Labs: reviewed CBG's: 100-118-93-102 Meds: SS novolog, Juven, Colace   Diet Order:   Diet Order            Diet NPO time specified  Diet effective now              EDUCATION NEEDS:   No education needs have been identified at this time  Skin:  Skin Assessment: Skin Integrity Issues: Skin Integrity Issues:: Unstageable Stage II: neck, penis Unstageable: sacrum Incisions: groin  Last BM:  12/16 type 6  Height:   Ht Readings from Last 1  Encounters:  06/06/19 6' (1.829 m)    Weight:   Wt Readings from Last 1 Encounters:  07/20/19 76.5 kg    Ideal Body Weight:  80.9 kg  BMI:  Body mass index is 22.87 kg/m.  Estimated Nutritional Needs:   Kcal:  2200-2400 kcals  Protein:  110-130 g  Fluid:  >/= 2 L    Molli Barrows, RD, LDN, [place] Pager [phone] After Hours Pager [phone]

## 2019-07-20 NOTE — Progress Notes (Signed)
NAME:  Derek Harrell, MRN:  [id number], DOB:  12/04/1945, LOS: 70 ADMISSION DATE:  06/06/2019, CONSULTATION DATE:  11/2 REFERRING MD:  Johnney Killian, CHIEF COMPLAINT:  Hypertensive crisis and fever    Brief History   73 year old male w/ h/o parkinson's dementia.  Admitted 11/2, 3 days s/p endovascular infrarenal aortic aneurysm repair on 10/29 w/ worsening weakness, MS change, HTN and fever.   Admitted to ICU, developed third degree heart block with agonal respirations with thick mucous suctioned requiring intubation.  Extubated on 11/4.  Ttransferred out to the floor 11/5 afternoon.  Then 11/6 early AM he suffered cardiac arrest.  RN alerted by telemetry to a run of VT.  He then became bradycardica and arrested. ACLS done for 12 mins prior to ROSC. He was unresponsive in the post-arrest setting requiring intubation. Transferred to ICU for TTM.  Post rewarming, patient has had no improvement in mental status with MRI consistent with hypoxic encephalopathy.   S/p tracheostomy/PEG tube placement 06/24/2019  Past Medical History  AAA, prior CVA, HTN, HL, MG, RLS, PD, OSA (did not tolerate CPAP), gait disorder   Significant Hospital Events   11/2 >>  admitted to ICU 11/3  >>Bradycardic arrest from mucus plugging.  11/4 >> extubated 11/5 >> transferred to tele/ TRH 11/6 >> PEA Cardiac Arrest, ROSC after 12 minutes >> began TTM to 36 11/7 >> Re-warming 11/7 completed at noon  11/20 >> trach, PEG by surgery. Had bleeding post procedure 11/24 >> Transfer to hospitalist service  Consults:  Vascular surgery  PMT  General surgery  Procedures:  ETT 11/3 >> 11/4, 11/6 >> CVC LIJ 11/06 >> 11/11 Right Radial Aline 11/06 >> 11/9 PEG tube placement 11/20 Tracheostomy 11/20  Significant Diagnostic Tests:  CT head 11/2 >> Stable white matter disease.  MRI brain 11/9 >> Restricted diffusion in the occipital lobes bilaterally and in the frontal parietal lobes over the convexity bilaterally compatible with  acute or subacute infarction. Atrophy and extensive chronic ischemic change in the brain. Multiple areas of chronic microhemorrhage which have progressed since 2017.  Micro Data:  Blood cultures 11/2>> negative   SARS Cov2 11/2 >> negative. Blood cultures 11/6 >> negative  Urine culture 11/6 >> negative Tracheal aspirate 11/6 >> negative  Antimicrobials:  Cefepime 11/6 >> 11/10 Vancomycin 11/6 >> 11/10  Interim history/subjective:  12/4: attempted PS today without success. Suspect pt will need 24/7 vent support. Cont to await dispo planning.  12/2: Last seen by [facility] 11/29, Family requesting SNF discharge.  Medicaid paper work started and may take up to 90 days.  Tolerated PSV from 12p-8p yesterday. Placed on rate overnight.  12/8: tolerating 5/5 then progressively 0/5 12/10 tolerated TC for 6 hours 12/11 tolerating TC this AM but remains unresponsive  12/14 72 hours off the vent but had an episode of bradycardia overnight and there was a concern of having to go back on the ventilator (was not necessary)  Objective   Blood pressure (!) 125/49, pulse 87, temperature (!) 97 F (36.1 C), temperature source Axillary, resp. rate 16, height 6' (1.829 m), weight 76.5 kg, SpO2 100 %.    FiO2 (%):  [21 %] 21 %   Intake/Output Summary (Last 24 hours) at 07/20/2019 1252 Last data filed at 07/20/2019 0926 Gross per 24 hour  Intake 2175.83 ml  Output 1260 ml  Net 915.83 ml   Filed Weights   07/18/19 0433 07/19/19 0456 07/20/19 0500  Weight: 76.5 kg 77.4 kg 76.5 kg  Examination:  General: Chronically ill appearing elderly male on ATC, in NAD HEENT: Trach midline, MM pink/moist, PERRL,  Neuro: withdrawal to painful stimuli on right CV: s1s2 regular rate and rhythm, no murmur, rubs, or gallops,  PULM:  Course breath sounds bilaterally, no increased work of breathing  GI: soft, bowel sounds active in all 4 quadrants, non-tender, non-distended, tolerating TF Extremities: warm/dry, no edema   Skin: no rashes or lesions  Assessment & Plan:   Acute hypoxemic respiratory failure s/p trach Obstructive sleep apnea, noncompliant with treatment at baseline Anoxic encephalopathy.   - Trach placed 11/20. Suture removal 12/2 Plan: Trach care per protocol Maintain on ACT as tolerated  Strive for even fluid balance No PMV or capping  Likely able to transfer to SNF soon, per primary    Best practice:  Diet: Tube feeds at goal Pain/Anxiety/Delirium protocol (if indicated): Home meds VAP protocol (if indicated): In place DVT prophylaxis: Subcu heparin-held since 19th, scd per primary GI prophylaxis: Continue PPI Glucose control: SSI Mobility: BR Code Status: full code  Family Communication: Per primary Disposition: ICU, awaiting placement  Johnsie Cancel, NP-C [facility] Contact / Pager information can be found on Amion  07/20/2019, 12:58 PM

## 2019-07-20 NOTE — Progress Notes (Signed)
Started teaching wife how to bolus feed patient. Educated how to assess site and potential complications. I showed patient how to draw up the tube feed and how to administer it. I then observed her do it multiple times. I was in the patient's room the entire time and there to answer any questions that she had.

## 2019-07-20 NOTE — TOC Progression Note (Signed)
Transition of Care [facility]) - Progression Note    Patient Details  Name: Derek Harrell MRN: [id number] Date of Birth: [birth year]  Transition of Care [facility]) CM/SW Contact  Bartholomew Crews, RN Phone Number: [phone] 07/20/2019, 4:05 PM  Clinical Narrative:    Received call from liaison at [facility]. She has spoken with spouse about option to take patient home with hospice. Spouse is now "absorbing" the information and will let MD know if she chooses this option.   [facility] requests that if patient does go home with hospice care that spouse be shown how to do the bolus feeds and how to suction trach, and [facility] will follow up with additional teaching in the home.   If hospice at home is chosen, [facility] will make arrangements for all DME needs.  TOC team following for transition needs.    Expected Discharge Plan: Long Term Nursing Home Barriers to Discharge: Continued Medical Work up  Expected Discharge Plan and Services Expected Discharge Plan: [place] In-house Referral: Clinical Social Work Discharge Planning Services: CM Consult   Living arrangements for the past 2 months: [place] Determinants of Health (SDOH) Interventions    Readmission Risk Interventions Readmission Risk Prevention Plan 06/03/2019  Transportation Screening Complete  PCP or Specialist Appt within 3-5 Days Complete  HRI or [place] Complete  Social Work Consult for [place] Planning/Counseling Complete  Palliative Care Screening Not Applicable  Medication Review Press photographer) Complete  Some recent data might be hidden

## 2019-07-21 LAB — GLUCOSE, CAPILLARY
Glucose-Capillary: 111 mg/dL — ABNORMAL HIGH (ref 70–99)
Glucose-Capillary: 98 mg/dL (ref 70–99)
Glucose-Capillary: 147 mg/dL — ABNORMAL HIGH (ref 70–99)
Glucose-Capillary: 116 mg/dL — ABNORMAL HIGH (ref 70–99)

## 2019-07-21 LAB — MRSA PCR SCREENING: MRSA by PCR: NEGATIVE

## 2019-07-21 NOTE — Progress Notes (Addendum)
Patient ID: Derek Harrell, male   DOB: 1946/05/01, 73 y.o.   MRN: [id number]  PROGRESS NOTE    Derek Harrell  [id number] DOB: [birth year] DOA: 06/06/2019 PCP: [name], MD   Brief Narrative:  73 year old male with history of Parkinson's dementia, hypertension, hyperlipidemia, OSA (did not tolerate CPAP), AAA, history of recent endovascular repair of aortic aneurysm 3 days prior to presentation on 06/02/2019, prior unspecified CVA, cellulitis, restless leg syndrome presented on 06/06/2019 with worsening weakness, mental status change, hypertension and fever.  He was admitted to ICU, developed third-degree heart block with agonal respirations with thick mucus suctioned requiring intubation.  He was extubated on 06/08/2019.  Transferred out to the floor on 06/09/2019.  On 06/10/2019 AM, he suffered cardiac arrest requiring ACLS and intubation.  He was transferred back to ICU.  Post rewarming, patient has had no improvement in mental status with MRI consistent with hypoxic encephalopathy.  He is status post tracheostomy/PEG tube placement on 06/24/2019.   He was transferred to [facility] service on 06/28/2019.  PCCM will keep following for tracheostomy and vent.  Palliative care has evaluated the patient.  Patient remains full code.  Patient is recently tolerating trach collar.  Assessment & Plan:   Acute hypoxic respiratory failure status post intubation and subsequent tracheostomy on a ventilator Concern for aspiration event with ongoing issues with swallowing and secretion management Obstructive sleep apnea, noncompliant with treatment at baseline Status post PEG tube placement -Patient has had a long hospitalization and could not be liberated from the ventilator because of mental status changes.  Subsequently tracheostomy and PEG tube were placed on 06/24/2019.  Transferred to [facility] service on 06/28/2019.  PCCM will keep following for trach management. -Patient is currently on trach collar and doing well with  weaning since 07/18/2019. -Chest x-ray on 07/17/2019 showed no acute abnormality: He was started on cefepime on 07/17/2019 for excessive secretions but no history of fever or leukocytosis.  Cefepime was discontinued on 07/20/2019. -Continue tube feeding as per dietitian recommendations. -Currently afebrile and hemodynamically stable.  Cardiac arrest, unclear etiology Intermittent bradycardia -Status post bradycardic arrest lasting for about 12 minutes on 06/10/2019.  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 -Palliative care has had multiple discussions with the family.  Patient remains full code.  Overall prognosis is poor.  Consider hospice/comfort measures.  Oral thrush -Started on Diflucan on 07/18/2019.  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  Continue Norvasc  Acute on chronic anemia of chronic disease -Transfused 2 units of packed red cells on 06/26/2019.  Monitor hemoglobin.  Transfuse if hemoglobin less than 7.  Hemoglobin has been stable recently.  Hypernatremia -Monitor.  Improving.  Continue free water via tube.  Abdominal aortic aneurysm -Status post repair on 06/02/2019 by vascular surgery.  Continue Crestor  Pressure Injury 06/06/19 Sacrum Mid;Left Unstageable - Full thickness tissue loss in which the base of the ulcer is covered by slough (yellow, tan, gray, green or brown) and/or eschar (tan, brown or black) in the wound bed. unstageable when assessed on 1 (Active)  06/06/19 2032  Location: Sacrum  Location Orientation: Mid;Left  Staging: Unstageable - Full thickness tissue loss in which the base of the ulcer is covered by slough (yellow, tan, gray, green or brown) and/or eschar (tan, brown or black) in the  wound  bed.  Wound Description (Comments): unstageable when assessed on 12/7  Present on Admission: Yes     Pressure Injury 07/07/19 Neck Mid Stage II -  Partial thickness loss of dermis presenting as a shallow open ulcer with a red, pink wound bed without slough. yellow shallow penny sized (Active)  07/07/19 1200  Location: Neck  Location Orientation: Mid  Staging: Stage II -  Partial thickness loss of dermis presenting as a shallow open ulcer with a red, pink wound bed without slough.  Wound Description (Comments): yellow shallow penny sized  Present on Admission:      Pressure Injury 07/20/19 Penis Lower Stage II -  Partial thickness loss of dermis presenting as a shallow open ulcer with a red, pink wound bed without slough.  (Active)  07/20/19 0340  Location: Penis  Location Orientation: Lower  Staging: Stage II -  Partial thickness loss of dermis presenting as a shallow open ulcer with a red, pink wound bed without slough.  Wound Description (Comments): -- (Open wound on lower base of penis)  Present on Admission: No     DVT prophylaxis: SCDs/Lovenox Code Status: Full Family Communication: None at bedside Disposition Plan: Remains inpatient for management of respiratory failure/anoxic encephalopathy.  Will need SNF/LTAC versus home with hospice if wife agreeable Consultants: PCCM/vascular surgery/palliative care/general surgery/cardiology/neurology  Procedures:  Endotracheal tube: 06/07/2019-06/08/2019; 06/10/2019 onwards Tracheostomy and PEG tube placement on 06/24/2019 CVC left IJ: 06/10/2019-06/15/2019   Antimicrobials:  Anti-infectives (From admission, onward)   Start     Dose/Rate Route Frequency Ordered Stop   07/18/19 2200  fluconazole (DIFLUCAN) 40 MG/ML suspension 100 mg     100 mg Per Tube Daily 07/18/19 1946     07/17/19 1400  ceFEPIme (MAXIPIME) 2 g in sodium chloride 0.9 % 100 mL IVPB  Status:  Discontinued     2 g 200 mL/hr over 30 Minutes Intravenous Every 8  hours 07/17/19 1157 07/20/19 1048   06/14/19 0600  vancomycin (VANCOCIN) 1,500 mg in sodium chloride 0.9 % 500 mL IVPB  Status:  Discontinued     1,500 mg 250 mL/hr over 120 Minutes Intravenous Every 24 hours 06/13/19 1207 06/13/19 1220   06/13/19 1800  vancomycin (VANCOCIN) 1,500 mg in sodium chloride 0.9 % 500 mL IVPB  Status:  Discontinued     1,500 mg 250 mL/hr over 120 Minutes Intravenous Every 24 hours 06/13/19 1220 06/14/19 0944   06/10/19 0615  vancomycin (VANCOCIN) IVPB 1000 mg/200 mL premix  Status:  Discontinued     1,000 mg 200 mL/hr over 60 Minutes Intravenous Every 12 hours 06/10/19 0605 06/13/19 1207   06/10/19 0615  ceFEPIme (MAXIPIME) 2 g in sodium chloride 0.9 % 100 mL IVPB  Status:  Discontinued     2 g 200 mL/hr over 30 Minutes Intravenous Every 8 hours 06/10/19 0605 06/14/19 0944       Subjective: Patient seen and examined at bedside.  Remains unresponsive.  No overnight fever, vomiting, black or bloody stools reported.    Objective: Vitals:   07/21/19 0400 07/21/19 0420 07/21/19 0500 07/21/19 0600  BP: 109/74  118/71 108/72  Pulse: 72 70 78 73  Resp: '15 14 16 15  '$ Temp:      TempSrc:      SpO2: 96% 97% 96% 97%  Weight:   77 kg   Height:        Intake/Output Summary (Last 24 hours) at 07/21/2019 0747 Last data filed at 07/20/2019 1709 Gross per 24 hour  Intake  1080.13 ml  Output 1175 ml  Net -94.87 ml   Filed Weights   07/19/19 0456 07/20/19 0500 07/21/19 0500  Weight: 77.4 kg 76.5 kg 77 kg    Examination:  General exam: Looks very deconditioned.  No distress.  Looks chronically ill.  Unresponsive.   Neck: Trach collar present Respiratory system: Bilateral decreased breath sounds at bases with scattered crackles  cardiovascular system: S1-S2 heard, rate controlled Gastrointestinal system: Abdomen is nondistended, soft and PEG tube present. Normal bowel sounds heard. Extremities: No cyanosis; trace lower extremity edema   Data Reviewed: I have  personally reviewed following labs and imaging studies  CBC: Recent Labs  Lab 07/15/19 0610 07/16/19 0241 07/17/19 1130 07/18/19 0454 07/19/19 0709  WBC 11.7* 11.5*  --  10.5 10.8*  NEUTROABS  --   --   --  7.1  --   HGB 8.4* 8.7* 8.5* 8.8* 9.5*  HCT 26.5* 27.3* 25.0* 29.7* 31.0*  MCV 83.3 84.3  --  89.5 86.1  PLT 255 279  --  287 924   Basic Metabolic Panel: Recent Labs  Lab 07/15/19 0610 07/16/19 0241 07/17/19 1130 07/18/19 0454 07/19/19 0709  NA 144 149* 151* 149* 145  K 4.3 4.3 3.5 4.7 4.4  CL 106 111  --  113* 110  CO2 27 27  --  26 26  GLUCOSE 118* 106*  --  102* 102*  BUN 71* 68*  --  63* 55*  CREATININE 0.96 0.76  --  0.86 0.69  CALCIUM 9.0 8.8*  --  9.0 9.1  MG 2.4  --   --  2.4 2.3  PHOS  --   --   --  4.2 4.1   GFR: Estimated Creatinine Clearance: 92.2 mL/min (by C-G formula based on SCr of 0.69 mg/dL). Liver Function Tests: Recent Labs  Lab 07/18/19 0454  ALBUMIN 2.0*   No results for input(s): LIPASE, AMYLASE in the last 168 hours. No results for input(s): AMMONIA in the last 168 hours. Coagulation Profile: No results for input(s): INR, PROTIME in the last 168 hours. Cardiac Enzymes: No results for input(s): CKTOTAL, CKMB, CKMBINDEX, TROPONINI in the last 168 hours. BNP (last 3 results) No results for input(s): PROBNP in the last 8760 hours. HbA1C: No results for input(s): HGBA1C in the last 72 hours. CBG: Recent Labs  Lab 07/20/19 0421 07/20/19 0804 07/20/19 1138 07/20/19 1611 07/20/19 2202  GLUCAP 118* 93 102* 129* 106*   Lipid Profile: No results for input(s): CHOL, HDL, LDLCALC, TRIG, CHOLHDL, LDLDIRECT in the last 72 hours. Thyroid Function Tests: No results for input(s): TSH, T4TOTAL, FREET4, T3FREE, THYROIDAB in the last 72 hours. Anemia Panel: No results for input(s): VITAMINB12, FOLATE, FERRITIN, TIBC, IRON, RETICCTPCT in the last 72 hours. Sepsis Labs: No results for input(s): PROCALCITON, LATICACIDVEN in the last 168  hours.  Recent Results (from the past 240 hour(s))  Culture, respiratory     Status: None   Collection Time: 07/17/19  3:03 PM   Specimen: SPU  Result Value Ref Range Status   Specimen Description SPUTUM DEEP SUCTION  Final   Special Requests NONE  Final   Gram Stain   Final    FEW SQUAMOUS EPITHELIAL CELLS PRESENT MODERATE WBC PRESENT, PREDOMINANTLY PMN FEW GRAM POSITIVE RODS RARE GRAM POSITIVE COCCI RARE GRAM NEGATIVE RODS Performed at [facility], [place] [address], [place], [place] [address]    Culture   Final    FEW CITROBACTER KOSERI RARE STAPHYLOCOCCUS AUREUS  Report Status 07/20/2019 FINAL  Final   Organism ID, Bacteria CITROBACTER KOSERI  Final   Organism ID, Bacteria STAPHYLOCOCCUS AUREUS  Final      Susceptibility   Citrobacter koseri - MIC*    CEFAZOLIN <=4 SENSITIVE Sensitive     CEFEPIME <=1 SENSITIVE Sensitive     CEFTAZIDIME <=1 SENSITIVE Sensitive     CEFTRIAXONE <=1 SENSITIVE Sensitive     CIPROFLOXACIN <=0.25 SENSITIVE Sensitive     GENTAMICIN <=1 SENSITIVE Sensitive     IMIPENEM <=0.25 SENSITIVE Sensitive     TRIMETH/SULFA <=20 SENSITIVE Sensitive     PIP/TAZO <=4 SENSITIVE Sensitive     * FEW CITROBACTER KOSERI   Staphylococcus aureus - MIC*    CIPROFLOXACIN <=0.5 SENSITIVE Sensitive     ERYTHROMYCIN <=0.25 SENSITIVE Sensitive     GENTAMICIN <=0.5 SENSITIVE Sensitive     OXACILLIN 0.5 SENSITIVE Sensitive     TETRACYCLINE <=1 SENSITIVE Sensitive     VANCOMYCIN 1 SENSITIVE Sensitive     TRIMETH/SULFA <=10 SENSITIVE Sensitive     CLINDAMYCIN <=0.25 SENSITIVE Sensitive     RIFAMPIN <=0.5 SENSITIVE Sensitive     Inducible Clindamycin NEGATIVE Sensitive     * RARE STAPHYLOCOCCUS AUREUS         Radiology Studies: No results found.      Scheduled Meds: . amLODipine  2.5 mg Per J Tube Daily  . budesonide (PULMICORT) nebulizer solution  0.25 mg Nebulization BID  . Carbidopa-Levodopa ER  1 tablet Per Tube 5 X Daily  . chlorhexidine  gluconate (MEDLINE KIT)  15 mL Mouth Rinse BID  . Chlorhexidine Gluconate Cloth  6 each Topical Daily  . collagenase   Topical Daily  . docusate  50 mg Per Tube Daily  . enoxaparin (LOVENOX) injection  40 mg Subcutaneous Q24H  . feeding supplement (OSMOLITE 1.5 CAL)  474 mL Per Tube TID  . feeding supplement (PRO-STAT SUGAR FREE 64)  30 mL Per Tube BID  . fluconazole  100 mg Per Tube Daily  . free water  200 mL Per Tube Q6H  . insulin aspart  0-15 Units Subcutaneous TID WC  . ipratropium-albuterol  3 mL Nebulization BID  . mouth rinse  15 mL Mouth Rinse 10 times per [place]  . nutrition supplement (JUVEN)  1 packet Per Tube BID BM  . pantoprazole sodium  40 mg Per Tube Daily  . Pimavanserin Tartrate  34 mg Per Tube Daily  . rosuvastatin  10 mg Per Tube q1800   Continuous Infusions: . sodium chloride Stopped (06/10/19 0000)  . sodium chloride Stopped (07/20/19 0730)          Aline August, MD [facility] 07/21/2019, 7:47 AM

## 2019-07-21 NOTE — TOC Progression Note (Addendum)
Transition of Care [facility]) - Progression Note    Patient Details  Name: Derek Harrell MRN: [id number] Date of Birth: 1946/03/06  Transition of Care [facility]) CM/SW Contact  Maryclare Labrador, RN Phone Number: 07/21/2019, 2:46 PM  Clinical Narrative:  Physician Advisor contacted both CM and attedning requesting discharge plan.  Per attending; pt is able to go home with hospice if wife is in agreement.  Home with Hospice referral was already given to [facility] by 85M CM previously.  CM confirmed with agency that pt has been accepted as is - pt will go home with trach/trach collar and peg/tube feeds.  Agency to provide all necessary equipment for the home.  [facility] has been in contact with wife today - per agency wife in agreement and request pt discharge home on Sat to allow for proper teaching on trach suctioning/trach care/tube feeds, also to get home set up for receiving all necessary equipment to get pt safely in the home.  TOC leadership aware of barrier with pt discharging home prior to Sat.  CM confirmed with wife the above plan.  Wife confirms she is able to accept pt in the home with trach/trach collar and tube feeds via peg.   Wife informed CM that she still works but will secure someone to be be with pt when she is away from the home.  Wife is in agreement to get teaching on the above - wife informed CM that she will be available this evening.  CM provided wife unit phone number so she could arrange times for necessary teaching.  CM also informed the charge nurse on unit the need for teaching and the request of 2 tube feed bottles to accompany pt home on Saturday.   Pt will need to transport home via [facility] - address verified in Epic to be correct.        Expected Discharge Plan: Long Term Nursing Home Barriers to Discharge: Continued Medical Work up  Expected Discharge Plan and Services Expected Discharge Plan: [place] In-house Referral: Clinical Social Work Discharge Planning  Services: CM Consult   Living arrangements for the past 2 months: [place] Determinants of Health (SDOH) Interventions    Readmission Risk Interventions Readmission Risk Prevention Plan 06/03/2019  Transportation Screening Complete  PCP or Specialist Appt within 3-5 Days Complete  HRI or [place] Complete  Social Work Consult for [place] Planning/Counseling Complete  Palliative Care Screening Not Applicable  Medication Review Press photographer) Complete  Some recent data might be hidden

## 2019-07-22 ENCOUNTER — Inpatient Hospital Stay (HOSPITAL_COMMUNITY): Payer: Medicare Other

## 2019-07-22 LAB — COMPREHENSIVE METABOLIC PANEL
ALT: 16 U/L (ref 0–44)
AST: 77 U/L — ABNORMAL HIGH (ref 15–41)
Albumin: 2.2 g/dL — ABNORMAL LOW (ref 3.5–5.0)
Alkaline Phosphatase: 93 U/L (ref 38–126)
Anion gap: 11 (ref 5–15)
BUN: 55 mg/dL — ABNORMAL HIGH (ref 8–23)
CO2: 25 mmol/L (ref 22–32)
Calcium: 9.2 mg/dL (ref 8.9–10.3)
Chloride: 111 mmol/L (ref 98–111)
Creatinine, Ser: 0.71 mg/dL (ref 0.61–1.24)
GFR calc Af Amer: >60 mL/min (ref >60)
GFR calc non Af Amer: >60 mL/min (ref >60)
Glucose, Bld: 104 mg/dL — ABNORMAL HIGH (ref 70–99)
Potassium: 4.6 mmol/L (ref 3.5–5.1)
Sodium: 147 mmol/L — ABNORMAL HIGH (ref 135–145)
Total Bilirubin: 0.4 mg/dL (ref 0.3–1.2)
Total Protein: 7.1 g/dL (ref 6.5–8.1)

## 2019-07-22 LAB — CBC WITH DIFFERENTIAL/PLATELET
Abs Immature Granulocytes: 0.12 10*3/uL — ABNORMAL HIGH (ref 0.00–0.07)
Basophils Absolute: 0.1 10*3/uL (ref 0.0–0.1)
Basophils Relative: 1 %
Eosinophils Absolute: 0.4 10*3/uL (ref 0.0–0.5)
Eosinophils Relative: 4 %
HCT: 30.3 % — ABNORMAL LOW (ref 39.0–52.0)
Hemoglobin: 9.2 g/dL — ABNORMAL LOW (ref 13.0–17.0)
Immature Granulocytes: 1 %
Lymphocytes Relative: 15 %
Lymphs Abs: 1.7 10*3/uL (ref 0.7–4.0)
MCH: 25.8 pg — ABNORMAL LOW (ref 26.0–34.0)
MCHC: 30.4 g/dL (ref 30.0–36.0)
MCV: 85.1 fL (ref 80.0–100.0)
Monocytes Absolute: 0.8 10*3/uL (ref 0.1–1.0)
Monocytes Relative: 7 %
Neutro Abs: 7.9 10*3/uL — ABNORMAL HIGH (ref 1.7–7.7)
Neutrophils Relative %: 72 %
Platelets: 251 10*3/uL (ref 150–400)
RBC: 3.56 MIL/uL — ABNORMAL LOW (ref 4.22–5.81)
RDW: 15.7 % — ABNORMAL HIGH (ref 11.5–15.5)
WBC: 10.9 10*3/uL — ABNORMAL HIGH (ref 4.0–10.5)
nRBC: 0 % (ref 0.0–0.2)

## 2019-07-22 LAB — MAGNESIUM: Magnesium: 2.2 mg/dL (ref 1.7–2.4)

## 2019-07-22 LAB — GLUCOSE, CAPILLARY
Glucose-Capillary: 101 mg/dL — ABNORMAL HIGH (ref 70–99)
Glucose-Capillary: 152 mg/dL — ABNORMAL HIGH (ref 70–99)
Glucose-Capillary: 178 mg/dL — ABNORMAL HIGH (ref 70–99)
Glucose-Capillary: 129 mg/dL — ABNORMAL HIGH (ref 70–99)

## 2019-07-22 MED ORDER — FREE WATER
300.0000 mL | Freq: Four times a day (QID) | Status: DC
  Administered 2019-07-22 – 2019-07-23 (×4): 300 mL

## 2019-07-22 NOTE — Care Management Important Message (Signed)
Important Message  Patient Details  Name: Pinchas Decleene MRN: [id number] Date of Birth: 1946/03/02   Medicare Important Message Given:  Yes     Shelda Altes 07/22/2019, 12:16 PM

## 2019-07-22 NOTE — Progress Notes (Signed)
Nutrition Follow-up  DOCUMENTATION CODES:   Not applicable  INTERVENTION:   Continue Tube feeding:  Bolus feedings of Osmolite 1.5 474 mL (2cans) TID Pro-Stat 30 mL BID Provides 2330 kcals, 119 g of protein and 1086 mL of free water  Continue Juven BID, each packet provides 80 calories, 8 grams of carbohydrate, 2.5  grams of protein (collagen), 7 grams of L-arginine and 7 grams of L-glutamine; supplement contains CaHMB, Vitamins C, E, B12 and Zinc to promote wound healing  NUTRITION DIAGNOSIS:   Inadequate oral intake related to inability to eat as evidenced by NPO status.  Ongoing  GOAL:   Patient will meet greater than or equal to 90% of their needs  Met with TF  MONITOR:   Labs, Weight trends, Skin, I & O's, Other (Comment), TF tolerance(GOC)  REASON FOR ASSESSMENT:   Ventilator, Consult Enteral/tube feeding initiation and management  ASSESSMENT:   73 yo male admitted with worsening weakness, MS change, HTN, fever. PMH includes HTN, AAA, HLD, CVD, obesity, stroke, myasthenia gravis, RLS, Parkinson's disease, dementia.  11/02 - admit 11/03 - Cortrak placed, tip gastric per Cortrak team 11/04 - extubated 11/06 - cardiac arrest, reintubated, TTM 36 11/20 - Trach and PEG placed  Reviewed I/O's: -1.6 L x 24 hours and -10 L since 07/08/19  UOP: 3 L x 24 hours  RN and respiratory therapy in with pt at time of visit.   Pt remains on trach collar.   Per RN notes, pt is not alert.   Pt continues to tolerate Osmolite 1.5 boluses via G-tube. Receiving 300 ml free water flushes every 6 hours.  Per RNCM notes, plan to d/c home tomorrow with hospice services. Pt wife has been receiving education to care for pt at home.   Labs reviewed: Na: 147, CBGS: 101-116.   Diet Order:   Diet Order            Diet NPO time specified  Diet effective now              EDUCATION NEEDS:   No education needs have been identified at this time  Skin:  Skin Assessment:  Skin Integrity Issues: Skin Integrity Issues:: Unstageable Stage II: neck, penis Unstageable: sacrum Incisions: groin  Last BM:  07/21/19  Height:   Ht Readings from Last 1 Encounters:  06/06/19 6' (1.829 m)    Weight:   Wt Readings from Last 1 Encounters:  07/22/19 78.8 kg    Ideal Body Weight:  80.9 kg  BMI:  Body mass index is 23.56 kg/m.  Estimated Nutritional Needs:   Kcal:  2200-2400 kcals  Protein:  110-130 g  Fluid:  >/= 2 L    Derek Harrell A. Jimmye Norman, RD, LDN, [place] Registered Dietitian II Certified Diabetes Care and Education Specialist Pager: [phone] After hours Pager: [phone]

## 2019-07-22 NOTE — Progress Notes (Addendum)
There is no change in patient condition-  Heart is irregular-  Tele-monitor reading 89.  Patient sat are 98%.  Will continue to monitor

## 2019-07-22 NOTE — Progress Notes (Addendum)
Patient is not responsive to Voice at handoff.  Per Night nurse- this is not an acute change.  MD is aware- will set up vitals q 2h  x2

## 2019-07-22 NOTE — Progress Notes (Signed)
Patient ID: Derek Harrell, male   DOB: September 24, 1945, 73 y.o.   MRN: [id number]  PROGRESS NOTE    Derek Harrell  OEU:[id number] DOB: Sep 15, 1945 DOA: 06/06/2019 PCP: [name], MD   Brief Narrative:  73 year old male with history of Parkinson's dementia, hypertension, hyperlipidemia, OSA (did not tolerate CPAP), AAA, history of recent endovascular repair of aortic aneurysm 3 days prior to presentation on 06/02/2019, prior unspecified CVA, cellulitis, restless leg syndrome presented on 06/06/2019 with worsening weakness, mental status change, hypertension and fever.  He was admitted to ICU, developed third-degree heart block with agonal respirations with thick mucus suctioned requiring intubation.  He was extubated on 06/08/2019.  Transferred out to the floor on 06/09/2019.  On 06/10/2019 AM, he suffered cardiac arrest requiring ACLS and intubation.  He was transferred back to ICU.  Post rewarming, patient has had no improvement in mental status with MRI consistent with hypoxic encephalopathy.  He is status post tracheostomy/PEG tube placement on 06/24/2019.   He was transferred to [facility] service on 06/28/2019.  PCCM will keep following for tracheostomy and vent.  Palliative care has evaluated the patient.  Patient remains full code.  Patient is recently tolerating trach collar.  Assessment & Plan:   Acute hypoxic respiratory failure status post intubation and subsequent tracheostomy on a ventilator Concern for aspiration event with ongoing issues with swallowing and secretion management Obstructive sleep apnea, noncompliant with treatment at baseline Status post PEG tube placement -Patient has had a long hospitalization and could not be liberated from the ventilator because of mental status changes.  Subsequently tracheostomy and PEG tube were placed on 06/24/2019.  Transferred to [facility] service on 06/28/2019.  PCCM will keep following for trach management. -Patient is currently on trach collar and doing well with  weaning since 07/18/2019. -Chest x-ray on 07/17/2019 showed no acute abnormality: He was started on cefepime on 07/17/2019 for excessive secretions but no history of fever or leukocytosis.  Cefepime was discontinued on 07/20/2019. -Continue tube feeding as per dietitian recommendations. -Patient had low-grade temperature overnight.  Will check labs this morning and also check portable chest x-ray.  Monitor off antibiotics for now.  If starts to spike again, might have to restart antibiotics as patient is a very high risk of aspiration.  Cardiac arrest, unclear etiology Intermittent bradycardia -Status post bradycardic arrest lasting for about 12 minutes on 06/10/2019.  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 -Palliative care has had multiple discussions with the family.  Patient remains full code.  Overall prognosis is poor.  Consider hospice/comfort measures. -I placed a reconsult for palliative care on 07/21/2019 as wife is leaning towards home hospice but patient still remains full code. -If wife wants to take the patient home with hospice in the current state, she will need teaching regarding PEG tube feeding along with trach care along with arrangements for PEG tube feeding at home.  Oral thrush -Started on Diflucan on 07/18/2019.  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  Continue Norvasc  Acute on chronic anemia of chronic disease -Transfused 2 units of packed red cells on 06/26/2019.  Monitor hemoglobin.  Transfuse if hemoglobin less than 7.  Hemoglobin has been stable recently.  Hypernatremia -Monitor.  Improving.  Continue free water via tube.  Abdominal aortic aneurysm -Status post repair on 06/02/2019 by  vascular  surgery.  Continue Crestor  Pressure Injury 06/06/19 Sacrum Mid;Left Unstageable - Full thickness tissue loss in which the base of the ulcer is covered by slough (yellow, tan, gray, green or brown) and/or eschar (tan, brown or black) in the wound bed. unstageable when assessed on 1 (Active)  06/06/19 2032  Location: Sacrum  Location Orientation: Mid;Left  Staging: Unstageable - Full thickness tissue loss in which the base of the ulcer is covered by slough (yellow, tan, gray, green or brown) and/or eschar (tan, brown or black) in the wound bed.  Wound Description (Comments): unstageable when assessed on 12/7  Present on Admission: Yes     Pressure Injury 07/07/19 Neck Mid Stage II -  Partial thickness loss of dermis presenting as a shallow open ulcer with a red, pink wound bed without slough. yellow shallow penny sized (Active)  07/07/19 1200  Location: Neck  Location Orientation: Mid  Staging: Stage II -  Partial thickness loss of dermis presenting as a shallow open ulcer with a red, pink wound bed without slough.  Wound Description (Comments): yellow shallow penny sized  Present on Admission:      Pressure Injury 07/20/19 Penis Lower Stage II -  Partial thickness loss of dermis presenting as a shallow open ulcer with a red, pink wound bed without slough.  (Active)  07/20/19 0340  Location: Penis  Location Orientation: Lower  Staging: Stage II -  Partial thickness loss of dermis presenting as a shallow open ulcer with a red, pink wound bed without slough.  Wound Description (Comments): -- (Open wound on lower base of penis)  Present on Admission: No     DVT prophylaxis: SCDs/Lovenox Code Status: Full Family Communication: None at bedside Disposition Plan: Remains inpatient for management of respiratory failure/anoxic encephalopathy.  Will need SNF/LTAC versus home with hospice if wife agreeable and arrangements can be made Consultants: PCCM/vascular surgery/palliative care/general  surgery/cardiology/neurology  Procedures:  Endotracheal tube: 06/07/2019-06/08/2019; 06/10/2019 onwards Tracheostomy and PEG tube placement on 06/24/2019 CVC left IJ: 06/10/2019-06/15/2019   Antimicrobials:  Anti-infectives (From admission, onward)   Start     Dose/Rate Route Frequency Ordered Stop   07/18/19 2200  fluconazole (DIFLUCAN) 40 MG/ML suspension 100 mg     100 mg Per Tube Daily 07/18/19 1946     07/17/19 1400  ceFEPIme (MAXIPIME) 2 g in sodium chloride 0.9 % 100 mL IVPB  Status:  Discontinued     2 g 200 mL/hr over 30 Minutes Intravenous Every 8 hours 07/17/19 1157 07/20/19 1048   06/14/19 0600  vancomycin (VANCOCIN) 1,500 mg in sodium chloride 0.9 % 500 mL IVPB  Status:  Discontinued     1,500 mg 250 mL/hr over 120 Minutes Intravenous Every 24 hours 06/13/19 1207 06/13/19 1220   06/13/19 1800  vancomycin (VANCOCIN) 1,500 mg in sodium chloride 0.9 % 500 mL IVPB  Status:  Discontinued     1,500 mg 250 mL/hr over 120 Minutes Intravenous Every 24 hours 06/13/19 1220 06/14/19 0944   06/10/19 0615  vancomycin (VANCOCIN) IVPB 1000 mg/200 mL premix  Status:  Discontinued     1,000 mg 200 mL/hr over 60 Minutes Intravenous Every 12 hours 06/10/19 0605 06/13/19 1207   06/10/19 0615  ceFEPIme (MAXIPIME) 2 g in sodium chloride 0.9 % 100 mL IVPB  Status:  Discontinued     2 g 200 mL/hr over 30 Minutes Intravenous Every 8 hours 06/10/19 0605 06/14/19 0944       Subjective: Patient seen and examined at bedside.  Remains unresponsive.  Had low-grade temperature overnight.  No vomiting, black or bloody stools reported by nursing staff. Objective: Vitals:   07/22/19 0127 07/22/19 0346 07/22/19 0549 07/22/19 0728  BP:   111/74 104/72  Pulse:  72  79  Resp: '18 18 18 18  '$ Temp: 99.9 F (37.7 C)  98.8 F (37.1 C) 97.9 F (36.6 C)  TempSrc:   Oral Oral  SpO2:  95% 99% 94%  Weight:   78.8 kg   Height:        Intake/Output Summary (Last 24 hours) at 07/22/2019 0815 Last data filed at  07/22/2019 0529 Gross per 24 hour  Intake 940 ml  Output 3025 ml  Net -2085 ml   Filed Weights   07/20/19 0500 07/21/19 0500 07/22/19 0549  Weight: 76.5 kg 77 kg 78.8 kg    Examination:  General exam: Remains unresponsive.  Looks very deconditioned.  No acute distress looks chronically ill.    Neck: Trach collar present Respiratory system: Bilateral decreased breath sounds at bases with some scattered crackles.  No wheezing  cardiovascular system: Rate controlled, S1-S2 heard Gastrointestinal system: Abdomen is nondistended, soft and PEG tube present. Normal bowel sounds heard. Extremities: No cyanosis; trace lower extremity edema   Data Reviewed: I have personally reviewed following labs and imaging studies  CBC: Recent Labs  Lab 07/16/19 0241 07/17/19 1130 07/18/19 0454 07/19/19 0709  WBC 11.5*  --  10.5 10.8*  NEUTROABS  --   --  7.1  --   HGB 8.7* 8.5* 8.8* 9.5*  HCT 27.3* 25.0* 29.7* 31.0*  MCV 84.3  --  89.5 86.1  PLT 279  --  287 673   Basic Metabolic Panel: Recent Labs  Lab 07/16/19 0241 07/17/19 1130 07/18/19 0454 07/19/19 0709  NA 149* 151* 149* 145  K 4.3 3.5 4.7 4.4  CL 111  --  113* 110  CO2 27  --  26 26  GLUCOSE 106*  --  102* 102*  BUN 68*  --  63* 55*  CREATININE 0.76  --  0.86 0.69  CALCIUM 8.8*  --  9.0 9.1  MG  --   --  2.4 2.3  PHOS  --   --  4.2 4.1   GFR: Estimated Creatinine Clearance: 93 mL/min (by C-G formula based on SCr of 0.69 mg/dL). Liver Function Tests: Recent Labs  Lab 07/18/19 0454  ALBUMIN 2.0*   No results for input(s): LIPASE, AMYLASE in the last 168 hours. No results for input(s): AMMONIA in the last 168 hours. Coagulation Profile: No results for input(s): INR, PROTIME in the last 168 hours. Cardiac Enzymes: No results for input(s): CKTOTAL, CKMB, CKMBINDEX, TROPONINI in the last 168 hours. BNP (last 3 results) No results for input(s): PROBNP in the last 8760 hours. HbA1C: No results for input(s): HGBA1C in  the last 72 hours. CBG: Recent Labs  Lab 07/21/19 0752 07/21/19 1216 07/21/19 1523 07/21/19 2126 07/22/19 0616  GLUCAP 111* 98 147* 116* 101*   Lipid Profile: No results for input(s): CHOL, HDL, LDLCALC, TRIG, CHOLHDL, LDLDIRECT in the last 72 hours. Thyroid Function Tests: No results for input(s): TSH, T4TOTAL, FREET4, T3FREE, THYROIDAB in the last 72 hours. Anemia Panel: No results for input(s): VITAMINB12, FOLATE, FERRITIN, TIBC, IRON, RETICCTPCT in the last 72 hours. Sepsis Labs: No results for input(s): PROCALCITON, LATICACIDVEN in the last 168 hours.  Recent Results (from the past 240 hour(s))  Culture, respiratory     Status: None   Collection Time: 07/17/19  3:03 PM   Specimen: SPU  Result Value Ref Range Status   Specimen Description SPUTUM DEEP SUCTION  Final   Special Requests NONE  Final   Gram Stain   Final    FEW SQUAMOUS EPITHELIAL CELLS PRESENT MODERATE WBC PRESENT, PREDOMINANTLY PMN FEW GRAM POSITIVE RODS RARE GRAM POSITIVE COCCI RARE GRAM NEGATIVE RODS Performed at [facility], [place] [address], [place], [place] [address]    Culture   Final    FEW CITROBACTER KOSERI RARE STAPHYLOCOCCUS AUREUS    Report Status 07/20/2019 FINAL  Final   Organism ID, Bacteria CITROBACTER KOSERI  Final   Organism ID, Bacteria STAPHYLOCOCCUS AUREUS  Final      Susceptibility   Citrobacter koseri - MIC*    CEFAZOLIN <=4 SENSITIVE Sensitive     CEFEPIME <=1 SENSITIVE Sensitive     CEFTAZIDIME <=1 SENSITIVE Sensitive     CEFTRIAXONE <=1 SENSITIVE Sensitive     CIPROFLOXACIN <=0.25 SENSITIVE Sensitive     GENTAMICIN <=1 SENSITIVE Sensitive     IMIPENEM <=0.25 SENSITIVE Sensitive     TRIMETH/SULFA <=20 SENSITIVE Sensitive     PIP/TAZO <=4 SENSITIVE Sensitive     * FEW CITROBACTER KOSERI   Staphylococcus aureus - MIC*    CIPROFLOXACIN <=0.5 SENSITIVE Sensitive     ERYTHROMYCIN <=0.25 SENSITIVE Sensitive     GENTAMICIN <=0.5 SENSITIVE Sensitive     OXACILLIN 0.5  SENSITIVE Sensitive     TETRACYCLINE <=1 SENSITIVE Sensitive     VANCOMYCIN 1 SENSITIVE Sensitive     TRIMETH/SULFA <=10 SENSITIVE Sensitive     CLINDAMYCIN <=0.25 SENSITIVE Sensitive     RIFAMPIN <=0.5 SENSITIVE Sensitive     Inducible Clindamycin NEGATIVE Sensitive     * RARE STAPHYLOCOCCUS AUREUS  MRSA PCR Screening     Status: None   Collection Time: 07/21/19 10:52 AM   Specimen: Nasal Mucosa; Nasopharyngeal  Result Value Ref Range Status   MRSA by PCR NEGATIVE NEGATIVE Final    Comment:        The GeneXpert MRSA Assay (FDA approved for NASAL specimens only), is one component of a comprehensive MRSA colonization surveillance program. It is not intended to diagnose MRSA infection nor to guide or monitor treatment for MRSA infections. Performed at [facility], [place] [address], [place], [place] [address]          Radiology Studies: No results found.      Scheduled Meds: . amLODipine  2.5 mg Per J Tube Daily  . budesonide (PULMICORT) nebulizer solution  0.25 mg Nebulization BID  . Carbidopa-Levodopa ER  1 tablet Per Tube 5 X Daily  . chlorhexidine gluconate (MEDLINE KIT)  15 mL Mouth Rinse BID  . Chlorhexidine Gluconate Cloth  6 each Topical Daily  . collagenase   Topical Daily  . docusate  50 mg Per Tube Daily  . enoxaparin (LOVENOX) injection  40 mg Subcutaneous Q24H  . feeding supplement (OSMOLITE 1.5 CAL)  474 mL Per Tube TID  . feeding supplement (PRO-STAT SUGAR FREE 64)  30 mL Per Tube BID  . fluconazole  100 mg Per Tube Daily  . free water  200 mL Per Tube Q6H  . insulin aspart  0-15 Units Subcutaneous TID WC  . ipratropium-albuterol  3 mL Nebulization BID  . mouth rinse  15 mL Mouth Rinse 10 times per [place]  . nutrition supplement (JUVEN)  1 packet Per Tube BID BM  . pantoprazole sodium  40 mg Per Tube Daily  .  Pimavanserin Tartrate  34 mg Per Tube Daily  . rosuvastatin  10 mg Per Tube q1800   Continuous Infusions: . sodium chloride Stopped  (06/10/19 0000)  . sodium chloride Stopped (07/20/19 0730)          Aline August, MD [facility] 07/22/2019, 8:15 AM

## 2019-07-23 LAB — CBC WITH DIFFERENTIAL/PLATELET
Abs Immature Granulocytes: 0.09 10*3/uL — ABNORMAL HIGH (ref 0.00–0.07)
Basophils Absolute: 0.1 10*3/uL (ref 0.0–0.1)
Basophils Relative: 0 %
Eosinophils Absolute: 0.4 10*3/uL (ref 0.0–0.5)
Eosinophils Relative: 4 %
HCT: 31.8 % — ABNORMAL LOW (ref 39.0–52.0)
Hemoglobin: 9.7 g/dL — ABNORMAL LOW (ref 13.0–17.0)
Immature Granulocytes: 1 %
Lymphocytes Relative: 14 %
Lymphs Abs: 1.7 10*3/uL (ref 0.7–4.0)
MCH: 26.1 pg (ref 26.0–34.0)
MCHC: 30.5 g/dL (ref 30.0–36.0)
MCV: 85.5 fL (ref 80.0–100.0)
Monocytes Absolute: 0.9 10*3/uL (ref 0.1–1.0)
Monocytes Relative: 8 %
Neutro Abs: 8.8 10*3/uL — ABNORMAL HIGH (ref 1.7–7.7)
Neutrophils Relative %: 73 %
Platelets: 255 10*3/uL (ref 150–400)
RBC: 3.72 MIL/uL — ABNORMAL LOW (ref 4.22–5.81)
RDW: 15.9 % — ABNORMAL HIGH (ref 11.5–15.5)
WBC: 12 10*3/uL — ABNORMAL HIGH (ref 4.0–10.5)
nRBC: 0 % (ref 0.0–0.2)

## 2019-07-23 LAB — BASIC METABOLIC PANEL
Anion gap: 10 (ref 5–15)
BUN: 68 mg/dL — ABNORMAL HIGH (ref 8–23)
CO2: 27 mmol/L (ref 22–32)
Calcium: 9.3 mg/dL (ref 8.9–10.3)
Chloride: 110 mmol/L (ref 98–111)
Creatinine, Ser: 0.77 mg/dL (ref 0.61–1.24)
GFR calc Af Amer: >60 mL/min (ref >60)
GFR calc non Af Amer: >60 mL/min (ref >60)
Glucose, Bld: 117 mg/dL — ABNORMAL HIGH (ref 70–99)
Potassium: 5.1 mmol/L (ref 3.5–5.1)
Sodium: 147 mmol/L — ABNORMAL HIGH (ref 135–145)

## 2019-07-23 LAB — GLUCOSE, CAPILLARY
Glucose-Capillary: 122 mg/dL — ABNORMAL HIGH (ref 70–99)
Glucose-Capillary: 168 mg/dL — ABNORMAL HIGH (ref 70–99)
Glucose-Capillary: 175 mg/dL — ABNORMAL HIGH (ref 70–99)
Glucose-Capillary: 129 mg/dL — ABNORMAL HIGH (ref 70–99)

## 2019-07-23 LAB — MAGNESIUM: Magnesium: 2.3 mg/dL (ref 1.7–2.4)

## 2019-07-23 MED ORDER — FREE WATER
400.0000 mL | Freq: Four times a day (QID) | Status: DC
  Administered 2019-07-23 – 2019-07-25 (×9): 400 mL

## 2019-07-23 NOTE — Progress Notes (Addendum)
Patient ID: Derek Harrell, male   DOB: [birth year], 73 y.o.   MRN: [id number]  PROGRESS NOTE    Derek Harrell  XLK:[id number] DOB: 1946-05-13 DOA: 06/06/2019 PCP: [name], MD   Brief Narrative:  73 year old male with history of Parkinson's dementia, hypertension, hyperlipidemia, OSA (did not tolerate CPAP), AAA, history of recent endovascular repair of aortic aneurysm 3 days prior to presentation on 06/02/2019, prior unspecified CVA, cellulitis, restless leg syndrome presented on 06/06/2019 with worsening weakness, mental status change, hypertension and fever.  He was admitted to ICU, developed third-degree heart block with agonal respirations with thick mucus suctioned requiring intubation.  He was extubated on 06/08/2019.  Transferred out to the floor on 06/09/2019.  On 06/10/2019 AM, he suffered cardiac arrest requiring ACLS and intubation.  He was transferred back to ICU.  Post rewarming, patient has had no improvement in mental status with MRI consistent with hypoxic encephalopathy.  He is status post tracheostomy/PEG tube placement on 06/24/2019.   He was transferred to [facility] service on 06/28/2019.  PCCM will keep following for tracheostomy and vent.  Palliative care has evaluated the patient.  Patient remains full code.  Patient is recently tolerating trach collar.  Assessment & Plan:   Acute hypoxic respiratory failure status post intubation and subsequent tracheostomy on a ventilator Concern for aspiration event with ongoing issues with swallowing and secretion management Obstructive sleep apnea, noncompliant with treatment at baseline Status post PEG tube placement -Patient has had a long hospitalization and could not be liberated from the ventilator because of mental status changes.  Subsequently tracheostomy and PEG tube were placed on 06/24/2019.  Transferred to [facility] service on 06/28/2019.  PCCM will keep following for trach management. -Patient is currently on trach collar and doing well with  weaning since 07/18/2019. -Chest x-ray on 07/17/2019 showed no acute abnormality: He was started on cefepime on 07/17/2019 for excessive secretions but no history of fever or leukocytosis.  Cefepime was discontinued on 07/20/2019. -Continue tube feeding as per dietitian recommendations. -Patient had low-grade temperature overnight again.  Chest x-ray from 07/22/2019 showed no acute abnormality.  He has mild leukocytosis today.  Repeat a.m. labs.  Monitor off antibiotics for now.  If starts to spike again consistently, might have to restart antibiotics as patient is a very high risk of aspiration.  Cardiac arrest, unclear etiology Intermittent bradycardia -Status post bradycardic arrest lasting for about 12 minutes on 06/10/2019.  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 -Palliative care has had multiple discussions with the family.  Patient remains full code.  Overall prognosis is poor.  Consider hospice/comfort measures. -I placed a reconsult for palliative care on 07/21/2019 as wife is leaning towards home hospice but patient still remains full code. -If wife wants to take the patient home with hospice in the current state, she will need teaching regarding PEG tube feeding along with trach care along with arrangements for PEG tube feeding at home. -Spoke to wife on 07/23/2019 at bedside and recommended that patient be made full comfort measures/full hospice including DNR.  She is not ready to make the decision yet.  Oral thrush -Started on Diflucan on 07/18/2019.  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  Continue Norvasc  Acute on chronic anemia of chronic disease -Transfused 2 units of packed red cells on  06/26/2019.  Monitor  hemoglobin.  Transfuse if hemoglobin less than 7.  Hemoglobin has been stable recently.  Hypernatremia -Monitor.  Sodium 147 today.  Will increase free water to 400 cc every 6 hours  Abdominal aortic aneurysm -Status post repair on 06/02/2019 by vascular surgery.  Continue Crestor  Pressure Injury 06/06/19 Sacrum Mid;Left Unstageable - Full thickness tissue loss in which the base of the ulcer is covered by slough (yellow, tan, gray, green or brown) and/or eschar (tan, brown or black) in the wound bed. unstageable when assessed on 1 (Active)  06/06/19 2032  Location: Sacrum  Location Orientation: Mid;Left  Staging: Unstageable - Full thickness tissue loss in which the base of the ulcer is covered by slough (yellow, tan, gray, green or brown) and/or eschar (tan, brown or black) in the wound bed.  Wound Description (Comments): unstageable when assessed on 12/7  Present on Admission: Yes     Pressure Injury 07/07/19 Neck Mid Stage II -  Partial thickness loss of dermis presenting as a shallow open ulcer with a red, pink wound bed without slough. yellow shallow penny sized (Active)  07/07/19 1200  Location: Neck  Location Orientation: Mid  Staging: Stage II -  Partial thickness loss of dermis presenting as a shallow open ulcer with a red, pink wound bed without slough.  Wound Description (Comments): yellow shallow penny sized  Present on Admission:      Pressure Injury 07/20/19 Penis Lower Stage II -  Partial thickness loss of dermis presenting as a shallow open ulcer with a red, pink wound bed without slough.  (Active)  07/20/19 0340  Location: Penis  Location Orientation: Lower  Staging: Stage II -  Partial thickness loss of dermis presenting as a shallow open ulcer with a red, pink wound bed without slough.  Wound Description (Comments): -- (Open wound on lower base of penis)  Present on Admission: No     DVT prophylaxis: SCDs/Lovenox Code Status: Full Family Communication: Spoke to  wife at bedside on 07/23/2019 Disposition Plan: Remains inpatient for management of respiratory failure/anoxic encephalopathy.  Will need SNF/LTAC versus home with hospice if wife agreeable and arrangements can be made Consultants: PCCM/vascular surgery/palliative care/general surgery/cardiology/neurology  Procedures:  Endotracheal tube: 06/07/2019-06/08/2019; 06/10/2019 onwards Tracheostomy and PEG tube placement on 06/24/2019 CVC left IJ: 06/10/2019-06/15/2019   Antimicrobials:  Anti-infectives (From admission, onward)   Start     Dose/Rate Route Frequency Ordered Stop   07/18/19 2200  fluconazole (DIFLUCAN) 40 MG/ML suspension 100 mg     100 mg Per Tube Daily 07/18/19 1946     07/17/19 1400  ceFEPIme (MAXIPIME) 2 g in sodium chloride 0.9 % 100 mL IVPB  Status:  Discontinued     2 g 200 mL/hr over 30 Minutes Intravenous Every 8 hours 07/17/19 1157 07/20/19 1048   06/14/19 0600  vancomycin (VANCOCIN) 1,500 mg in sodium chloride 0.9 % 500 mL IVPB  Status:  Discontinued     1,500 mg 250 mL/hr over 120 Minutes Intravenous Every 24 hours 06/13/19 1207 06/13/19 1220   06/13/19 1800  vancomycin (VANCOCIN) 1,500 mg in sodium chloride 0.9 % 500 mL IVPB  Status:  Discontinued     1,500 mg 250 mL/hr over 120 Minutes Intravenous Every 24 hours 06/13/19 1220 06/14/19 0944   06/10/19 0615  vancomycin (VANCOCIN) IVPB 1000 mg/200 mL premix  Status:  Discontinued     1,000 mg 200 mL/hr over 60 Minutes Intravenous Every 12 hours 06/10/19 0605 06/13/19 1207   06/10/19 0615  ceFEPIme (MAXIPIME) 2 g in sodium chloride 0.9 % 100 mL IVPB  Status:  Discontinued     2 g 200 mL/hr over 30 Minutes Intravenous Every 8 hours 06/10/19 0605 06/14/19 0944       Subjective: Patient seen and examined at bedside.  Remains unresponsive.  Had temperature of 100 again last night.  Still having significant secretions from his trach no vomiting or problems with PEG tube feeding reported by nursing staff.    Objective: Vitals:   07/23/19 0000 07/23/19 0419 07/23/19 0451 07/23/19 0748  BP: 133/90  121/83 118/80  Pulse: 84 70  83  Resp: _0 Temp: 100 F (37.8 C)  99 F (37.2 C) 98.4 F (36.9 C)  TempSrc: Oral  Oral Oral  SpO2: 97% 95% 95% 95%  Weight:   78.9 kg   Height:        Intake/Output Summary (Last 24 hours) at 07/23/2019 0826 Last data filed at 07/23/2019 2549 Gross per 24 hour  Intake 3790 ml  Output 2350 ml  Net 1440 ml   Filed Weights   07/21/19 0500 07/22/19 0549 07/23/19 0451  Weight: 77 kg 78.8 kg 78.9 kg    Examination:  General exam: Unresponsive.  Looks very deconditioned.  No distress.  Looks chronically ill.    Neck: Trach collar present Respiratory system: Bilateral decreased breath sounds at bases with scattered crackles.   Cardiovascular system: S1-S2 heard, rate controlled Gastrointestinal system: Abdomen is nondistended, soft and PEG tube present. Normal bowel sounds heard. Extremities: Trace lower extremity edema present.  No cyanosis   Data Reviewed: I have personally reviewed following labs and imaging studies  CBC: Recent Labs  Lab 07/17/19 1130 07/18/19 0454 07/19/19 0709 07/22/19 0843 07/23/19 0252  WBC  --  10.5 10.8* 10.9* 12.0*  NEUTROABS  --  7.1  --  7.9* 8.8*  HGB 8.5* 8.8* 9.5* 9.2* 9.7*  HCT 25.0* 29.7* 31.0* 30.3* 31.8*  MCV  --  89.5 86.1 85.1 85.5  PLT  --  287 272 251 826   Basic Metabolic Panel: Recent Labs  Lab 07/17/19 1130 07/18/19 0454 07/19/19 0709 07/22/19 0843 07/23/19 0252  NA 151* 149* 145 147* 147*  K 3.5 4.7 4.4 4.6 5.1  CL  --  113* 110 111 110  CO2  --  _1 GLUCOSE  --  102* 102* 104* 117*  BUN  --  63* 55* 55* 68*  CREATININE  --  0.86 0.69 0.71 0.77  CALCIUM  --  9.0 9.1 9.2 9.3  MG  --  2.4 2.3 2.2 2.3  PHOS  --  4.2 4.1  --   --    GFR: Estimated Creatinine Clearance: 93 mL/min (by C-G formula based on SCr of 0.77 mg/dL). Liver Function Tests: Recent Labs  Lab  07/18/19 0454 07/22/19 0843  AST  --  77*  ALT  --  16  ALKPHOS  --  93  BILITOT  --  0.4  PROT  --  7.1  ALBUMIN 2.0* 2.2*   No results for input(s): LIPASE, AMYLASE in the last 168 hours. No results for input(s): AMMONIA in the last 168 hours. Coagulation Profile: No results for input(s): INR, PROTIME in the last 168 hours. Cardiac Enzymes: No results for input(s): CKTOTAL, CKMB, CKMBINDEX, TROPONINI in the last 168 hours. BNP (last 3 results) No results for input(s): PROBNP in the last 8760 hours. HbA1C: No results for input(s): HGBA1C in the last  72 hours. CBG: Recent Labs  Lab 07/22/19 0616 07/22/19 1103 07/22/19 1556 07/22/19 2102 07/23/19 0627  GLUCAP 101* 152* 178* 129* 122*   Lipid Profile: No results for input(s): CHOL, HDL, LDLCALC, TRIG, CHOLHDL, LDLDIRECT in the last 72 hours. Thyroid Function Tests: No results for input(s): TSH, T4TOTAL, FREET4, T3FREE, THYROIDAB in the last 72 hours. Anemia Panel: No results for input(s): VITAMINB12, FOLATE, FERRITIN, TIBC, IRON, RETICCTPCT in the last 72 hours. Sepsis Labs: No results for input(s): PROCALCITON, LATICACIDVEN in the last 168 hours.  Recent Results (from the past 240 hour(s))  Culture, respiratory     Status: None   Collection Time: 07/17/19  3:03 PM   Specimen: SPU  Result Value Ref Range Status   Specimen Description SPUTUM DEEP SUCTION  Final   Special Requests NONE  Final   Gram Stain   Final    FEW SQUAMOUS EPITHELIAL CELLS PRESENT MODERATE WBC PRESENT, PREDOMINANTLY PMN FEW GRAM POSITIVE RODS RARE GRAM POSITIVE COCCI RARE GRAM NEGATIVE RODS Performed at [facility], [place] [address] [place], [place] [address]    Culture   Final    FEW CITROBACTER KOSERI RARE STAPHYLOCOCCUS AUREUS    Report Status 07/20/2019 FINAL  Final   Organism ID, Bacteria CITROBACTER KOSERI  Final   Organism ID, Bacteria STAPHYLOCOCCUS AUREUS  Final      Susceptibility   Citrobacter koseri - MIC*     CEFAZOLIN <=4 SENSITIVE Sensitive     CEFEPIME <=1 SENSITIVE Sensitive     CEFTAZIDIME <=1 SENSITIVE Sensitive     CEFTRIAXONE <=1 SENSITIVE Sensitive     CIPROFLOXACIN <=0.25 SENSITIVE Sensitive     GENTAMICIN <=1 SENSITIVE Sensitive     IMIPENEM <=0.25 SENSITIVE Sensitive     TRIMETH/SULFA <=20 SENSITIVE Sensitive     PIP/TAZO <=4 SENSITIVE Sensitive     * FEW CITROBACTER KOSERI   Staphylococcus aureus - MIC*    CIPROFLOXACIN <=0.5 SENSITIVE Sensitive     ERYTHROMYCIN <=0.25 SENSITIVE Sensitive     GENTAMICIN <=0.5 SENSITIVE Sensitive     OXACILLIN 0.5 SENSITIVE Sensitive     TETRACYCLINE <=1 SENSITIVE Sensitive     VANCOMYCIN 1 SENSITIVE Sensitive     TRIMETH/SULFA <=10 SENSITIVE Sensitive     CLINDAMYCIN <=0.25 SENSITIVE Sensitive     RIFAMPIN <=0.5 SENSITIVE Sensitive     Inducible Clindamycin NEGATIVE Sensitive     * RARE STAPHYLOCOCCUS AUREUS  MRSA PCR Screening     Status: None   Collection Time: 07/21/19 10:52 AM   Specimen: Nasal Mucosa; Nasopharyngeal  Result Value Ref Range Status   MRSA by PCR NEGATIVE NEGATIVE Final    Comment:        The GeneXpert MRSA Assay (FDA approved for NASAL specimens only), is one component of a comprehensive MRSA colonization surveillance program. It is not intended to diagnose MRSA infection nor to guide or monitor treatment for MRSA infections. Performed at [facility], [place] [address], [place], [place] [address]          Radiology Studies: DG CHEST PORT 1 VIEW  Result Date: 07/22/2019 CLINICAL DATA:  73 year old male with shortness of breath. Repair of AAA in October with subsequent ICU stay including cardiac arrest. Anoxic brain injury. EXAM: PORTABLE CHEST 1 VIEW COMPARISON:  Portable chest 07/17/2019 and earlier. FINDINGS: Portable AP semi upright view at 0821 hours. Stable tracheostomy tube. Mildly lower lung volumes. Stable cardiomegaly and mediastinal contours. Allowing for portable technique the lungs are clear.  Negative visible  bowel gas pattern. No acute osseous abnormality identified. IMPRESSION: No acute cardiopulmonary abnormality. Electronically Signed   By: Genevie Ann M.D.   On: 07/22/2019 09:00        Scheduled Meds: . amLODipine  2.5 mg Per J Tube Daily  . budesonide (PULMICORT) nebulizer solution  0.25 mg Nebulization BID  . Carbidopa-Levodopa ER  1 tablet Per Tube 5 X Daily  . chlorhexidine gluconate (MEDLINE KIT)  15 mL Mouth Rinse BID  . Chlorhexidine Gluconate Cloth  6 each Topical Daily  . collagenase   Topical Daily  . docusate  50 mg Per Tube Daily  . enoxaparin (LOVENOX) injection  40 mg Subcutaneous Q24H  . feeding supplement (OSMOLITE 1.5 CAL)  474 mL Per Tube TID  . feeding supplement (PRO-STAT SUGAR FREE 64)  30 mL Per Tube BID  . fluconazole  100 mg Per Tube Daily  . free water  300 mL Per Tube Q6H  . insulin aspart  0-15 Units Subcutaneous TID WC  . ipratropium-albuterol  3 mL Nebulization BID  . mouth rinse  15 mL Mouth Rinse 10 times per [place]  . nutrition supplement (JUVEN)  1 packet Per Tube BID BM  . pantoprazole sodium  40 mg Per Tube Daily  . Pimavanserin Tartrate  34 mg Per Tube Daily  . rosuvastatin  10 mg Per Tube q1800   Continuous Infusions: . sodium chloride Stopped (06/10/19 0000)  . sodium chloride Stopped (07/20/19 0730)          Aline August, MD [facility] 07/23/2019, 8:26 AM

## 2019-07-24 LAB — CBC WITH DIFFERENTIAL/PLATELET
Abs Immature Granulocytes: 0.09 10*3/uL — ABNORMAL HIGH (ref 0.00–0.07)
Basophils Absolute: 0 10*3/uL (ref 0.0–0.1)
Basophils Relative: 0 %
Eosinophils Absolute: 0.4 10*3/uL (ref 0.0–0.5)
Eosinophils Relative: 3 %
HCT: 29.8 % — ABNORMAL LOW (ref 39.0–52.0)
Hemoglobin: 9.3 g/dL — ABNORMAL LOW (ref 13.0–17.0)
Immature Granulocytes: 1 %
Lymphocytes Relative: 13 %
Lymphs Abs: 1.7 10*3/uL (ref 0.7–4.0)
MCH: 26.3 pg (ref 26.0–34.0)
MCHC: 31.2 g/dL (ref 30.0–36.0)
MCV: 84.2 fL (ref 80.0–100.0)
Monocytes Absolute: 0.9 10*3/uL (ref 0.1–1.0)
Monocytes Relative: 7 %
Neutro Abs: 10.1 10*3/uL — ABNORMAL HIGH (ref 1.7–7.7)
Neutrophils Relative %: 76 %
Platelets: 208 10*3/uL (ref 150–400)
RBC: 3.54 MIL/uL — ABNORMAL LOW (ref 4.22–5.81)
RDW: 16 % — ABNORMAL HIGH (ref 11.5–15.5)
WBC: 13.3 10*3/uL — ABNORMAL HIGH (ref 4.0–10.5)
nRBC: 0 % (ref 0.0–0.2)

## 2019-07-24 LAB — BASIC METABOLIC PANEL
Anion gap: 11 (ref 5–15)
BUN: 72 mg/dL — ABNORMAL HIGH (ref 8–23)
CO2: 26 mmol/L (ref 22–32)
Calcium: 8.9 mg/dL (ref 8.9–10.3)
Chloride: 109 mmol/L (ref 98–111)
Creatinine, Ser: 0.83 mg/dL (ref 0.61–1.24)
GFR calc Af Amer: >60 mL/min (ref >60)
GFR calc non Af Amer: >60 mL/min (ref >60)
Glucose, Bld: 120 mg/dL — ABNORMAL HIGH (ref 70–99)
Potassium: 4.8 mmol/L (ref 3.5–5.1)
Sodium: 146 mmol/L — ABNORMAL HIGH (ref 135–145)

## 2019-07-24 LAB — GLUCOSE, CAPILLARY
Glucose-Capillary: 111 mg/dL — ABNORMAL HIGH (ref 70–99)
Glucose-Capillary: 101 mg/dL — ABNORMAL HIGH (ref 70–99)
Glucose-Capillary: 133 mg/dL — ABNORMAL HIGH (ref 70–99)
Glucose-Capillary: 107 mg/dL — ABNORMAL HIGH (ref 70–99)
Glucose-Capillary: 229 mg/dL — ABNORMAL HIGH (ref 70–99)

## 2019-07-24 LAB — MAGNESIUM: Magnesium: 2.3 mg/dL (ref 1.7–2.4)

## 2019-07-24 NOTE — Progress Notes (Signed)
Provider call back, update pt condition, pt is stable, rest in bed, no new order, continue monitor

## 2019-07-24 NOTE — Progress Notes (Signed)
Notify provider mew score 2, v/s stable, glasgow coma scale 5, follow up new order

## 2019-07-24 NOTE — Plan of Care (Signed)

## 2019-07-24 NOTE — Progress Notes (Signed)
Patient ID: Derek Harrell, male   DOB: 06/13/1946, 73 y.o.   MRN: [id number]  PROGRESS NOTE    Derek Harrell  VFI:[id number] DOB: [birth year] DOA: 06/06/2019 PCP: [name], MD   Brief Narrative:  73 year old male with history of Parkinson's dementia, hypertension, hyperlipidemia, OSA (did not tolerate CPAP), AAA, history of recent endovascular repair of aortic aneurysm 3 days prior to presentation on 06/02/2019, prior unspecified CVA, cellulitis, restless leg syndrome presented on 06/06/2019 with worsening weakness, mental status change, hypertension and fever.  He was admitted to ICU, developed third-degree heart block with agonal respirations with thick mucus suctioned requiring intubation.  He was extubated on 06/08/2019.  Transferred out to the floor on 06/09/2019.  On 06/10/2019 AM, he suffered cardiac arrest requiring ACLS and intubation.  He was transferred back to ICU.  Post rewarming, patient has had no improvement in mental status with MRI consistent with hypoxic encephalopathy.  He is status post tracheostomy/PEG tube placement on 06/24/2019.   He was transferred to [facility] service on 06/28/2019.  PCCM will keep following for tracheostomy and vent.  Palliative care has evaluated the patient.  Patient remains full code.  Patient is recently tolerating trach collar.  Assessment & Plan:   Acute hypoxic respiratory failure status post intubation and subsequent tracheostomy on a ventilator Concern for aspiration event with ongoing issues with swallowing and secretion management Obstructive sleep apnea, noncompliant with treatment at baseline Status post PEG tube placement -Patient has had a long hospitalization and could not be liberated from the ventilator because of mental status changes.  Subsequently tracheostomy and PEG tube were placed on 06/24/2019.  Transferred to [facility] service on 06/28/2019.  PCCM will keep following for trach management. -Patient is currently on trach collar and doing well with  weaning since 07/18/2019. -Currently still requiring 5 L oxygen -Chest x-ray on 07/17/2019 showed no acute abnormality: He was started on cefepime on 07/17/2019 for excessive secretions but no history of fever or leukocytosis.  Cefepime was discontinued on 07/20/2019. -Continue tube feeding as per dietitian recommendations. -Patient has had low-grade temperatures over the last few days.  Chest x-ray from 07/22/2019 showed no acute abnormality.  WBCs trending up.  Repeat a.m. labs.  Monitor off antibiotics for now.  If starts to spike again consistently, might have to restart antibiotics as patient is a very high risk of aspiration.  Cardiac arrest, unclear etiology Intermittent bradycardia -Status post bradycardic arrest lasting for about 12 minutes on 06/10/2019.  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 -Palliative care has had multiple discussions with the family.  Patient remains full code.  Overall prognosis is poor.  Consider hospice/comfort measures. -I placed a reconsult for palliative care on 07/21/2019 as wife is leaning towards home hospice but patient still remains full code. -If wife wants to take the patient home with hospice in the current state, she will need teaching regarding PEG tube feeding along with trach care along with arrangements for PEG tube feeding at home. -Spoke to wife on 07/23/2019 at bedside and recommended that patient be made full comfort measures/full hospice including DNR.  She is not ready to make the decision yet.  Oral thrush -Started on Diflucan on 07/18/2019.  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  Continue Norvasc  Acute on chronic anemia of chronic disease -  Transfused 2 units of packed  red cells on 06/26/2019.  Monitor hemoglobin.  Transfuse if hemoglobin less than 7.  Hemoglobin has been stable recently.  Hypernatremia -Monitor.  Sodium 146 today.  Continue free water 400 cc every 6 hours  Abdominal aortic aneurysm -Status post repair on 06/02/2019 by vascular surgery.  Continue Crestor  Pressure Injury 06/06/19 Sacrum Mid;Left Unstageable - Full thickness tissue loss in which the base of the ulcer is covered by slough (yellow, tan, gray, green or brown) and/or eschar (tan, brown or black) in the wound bed. unstageable when assessed on 1 (Active)  06/06/19 2032  Location: Sacrum  Location Orientation: Mid;Left  Staging: Unstageable - Full thickness tissue loss in which the base of the ulcer is covered by slough (yellow, tan, gray, green or brown) and/or eschar (tan, brown or black) in the wound bed.  Wound Description (Comments): unstageable when assessed on 12/7  Present on Admission: Yes     Pressure Injury 07/07/19 Neck Mid Stage II -  Partial thickness loss of dermis presenting as a shallow open ulcer with a red, pink wound bed without slough. yellow shallow penny sized (Active)  07/07/19 1200  Location: Neck  Location Orientation: Mid  Staging: Stage II -  Partial thickness loss of dermis presenting as a shallow open ulcer with a red, pink wound bed without slough.  Wound Description (Comments): yellow shallow penny sized  Present on Admission:      Pressure Injury 07/20/19 Penis Lower Stage II -  Partial thickness loss of dermis presenting as a shallow open ulcer with a red, pink wound bed without slough.  (Active)  07/20/19 0340  Location: Penis  Location Orientation: Lower  Staging: Stage II -  Partial thickness loss of dermis presenting as a shallow open ulcer with a red, pink wound bed without slough.  Wound Description (Comments): -- (Open wound on lower base of penis)  Present on Admission: No     DVT prophylaxis: SCDs/Lovenox Code Status:  Full Family Communication: Spoke to wife at bedside on 07/23/2019 Disposition Plan: Remains inpatient for management of respiratory failure/anoxic encephalopathy.  Will need SNF/LTAC versus home with hospice if wife agreeable and arrangements can be made Consultants: PCCM/vascular surgery/palliative care/general surgery/cardiology/neurology  Procedures:  Endotracheal tube: 06/07/2019-06/08/2019; 06/10/2019 onwards Tracheostomy and PEG tube placement on 06/24/2019 CVC left IJ: 06/10/2019-06/15/2019   Antimicrobials:  Anti-infectives (From admission, onward)   Start     Dose/Rate Route Frequency Ordered Stop   07/18/19 2200  fluconazole (DIFLUCAN) 40 MG/ML suspension 100 mg     100 mg Per Tube Daily 07/18/19 1946     07/17/19 1400  ceFEPIme (MAXIPIME) 2 g in sodium chloride 0.9 % 100 mL IVPB  Status:  Discontinued     2 g 200 mL/hr over 30 Minutes Intravenous Every 8 hours 07/17/19 1157 07/20/19 1048   06/14/19 0600  vancomycin (VANCOCIN) 1,500 mg in sodium chloride 0.9 % 500 mL IVPB  Status:  Discontinued     1,500 mg 250 mL/hr over 120 Minutes Intravenous Every 24 hours 06/13/19 1207 06/13/19 1220   06/13/19 1800  vancomycin (VANCOCIN) 1,500 mg in sodium chloride 0.9 % 500 mL IVPB  Status:  Discontinued     1,500 mg 250 mL/hr over 120 Minutes Intravenous Every 24 hours 06/13/19 1220 06/14/19 0944   06/10/19 0615  vancomycin (VANCOCIN) IVPB 1000 mg/200 mL premix  Status:  Discontinued     1,000 mg 200 mL/hr over 60 Minutes Intravenous Every 12 hours 06/10/19 0605  06/13/19 1207   06/10/19 0615  ceFEPIme (MAXIPIME) 2 g in sodium chloride 0.9 % 100 mL IVPB  Status:  Discontinued     2 g 200 mL/hr over 30 Minutes Intravenous Every 8 hours 06/10/19 0605 06/14/19 0944       Subjective: Patient seen and examined at bedside.  Remains unresponsive.  No overnight fever or vomiting reported.   Objective: Vitals:   07/24/19 0055 07/24/19 0405 07/24/19 0600 07/24/19 0615  BP:    120/77  Pulse:  60 74  73  Resp: _0 Temp:    98.9 F (37.2 C)  TempSrc:    Oral  SpO2: 96%   96%  Weight:   75.9 kg   Height:        Intake/Output Summary (Last 24 hours) at 07/24/2019 0809 Last data filed at 07/24/2019 0640 Gross per 24 hour  Intake 1070 ml  Output 1875 ml  Net -805 ml   Filed Weights   07/22/19 0549 07/23/19 0451 07/24/19 0600  Weight: 78.8 kg 78.9 kg 75.9 kg    Examination:  General exam: Still unresponsive.  Looks very deconditioned.  No acute distress.  Looks chronically ill.    Neck: Trach collar present Respiratory system: Bilateral decreased breath sounds at bases with some crackles  cardiovascular system: Rate controlled, S1-S2 heard Gastrointestinal system: Abdomen is nondistended, soft and PEG tube present. Normal bowel sounds heard. Extremities: Trace lower extremity edema present.  No cyanosis   Data Reviewed: I have personally reviewed following labs and imaging studies  CBC: Recent Labs  Lab 07/18/19 0454 07/19/19 0709 07/22/19 0843 07/23/19 0252 07/24/19 0425  WBC 10.5 10.8* 10.9* 12.0* 13.3*  NEUTROABS 7.1  --  7.9* 8.8* 10.1*  HGB 8.8* 9.5* 9.2* 9.7* 9.3*  HCT 29.7* 31.0* 30.3* 31.8* 29.8*  MCV 89.5 86.1 85.1 85.5 84.2  PLT 287 272 251 255 242   Basic Metabolic Panel: Recent Labs  Lab 07/18/19 0454 07/19/19 0709 07/22/19 0843 07/23/19 0252 07/24/19 0425  NA 149* 145 147* 147* 146*  K 4.7 4.4 4.6 5.1 4.8  CL 113* 110 111 110 109  CO2 _1 GLUCOSE 102* 102* 104* 117* 120*  BUN 63* 55* 55* 68* 72*  CREATININE 0.86 0.69 0.71 0.77 0.83  CALCIUM 9.0 9.1 9.2 9.3 8.9  MG 2.4 2.3 2.2 2.3 2.3  PHOS 4.2 4.1  --   --   --    GFR: Estimated Creatinine Clearance: 87.6 mL/min (by C-G formula based on SCr of 0.83 mg/dL). Liver Function Tests: Recent Labs  Lab 07/18/19 0454 07/22/19 0843  AST  --  77*  ALT  --  16  ALKPHOS  --  93  BILITOT  --  0.4  PROT  --  7.1  ALBUMIN 2.0* 2.2*   No results for input(s):  LIPASE, AMYLASE in the last 168 hours. No results for input(s): AMMONIA in the last 168 hours. Coagulation Profile: No results for input(s): INR, PROTIME in the last 168 hours. Cardiac Enzymes: No results for input(s): CKTOTAL, CKMB, CKMBINDEX, TROPONINI in the last 168 hours. BNP (last 3 results) No results for input(s): PROBNP in the last 8760 hours. HbA1C: No results for input(s): HGBA1C in the last 72 hours. CBG: Recent Labs  Lab 07/23/19 0627 07/23/19 1156 07/23/19 1602 07/23/19 2129 07/24/19 0645  GLUCAP 122* 168* 175* 129* 111*   Lipid Profile: No results for input(s): CHOL, HDL, LDLCALC, TRIG, CHOLHDL, LDLDIRECT in the last  72 hours. Thyroid Function Tests: No results for input(s): TSH, T4TOTAL, FREET4, T3FREE, THYROIDAB in the last 72 hours. Anemia Panel: No results for input(s): VITAMINB12, FOLATE, FERRITIN, TIBC, IRON, RETICCTPCT in the last 72 hours. Sepsis Labs: No results for input(s): PROCALCITON, LATICACIDVEN in the last 168 hours.  Recent Results (from the past 240 hour(s))  Culture, respiratory     Status: None   Collection Time: 07/17/19  3:03 PM   Specimen: SPU  Result Value Ref Range Status   Specimen Description SPUTUM DEEP SUCTION  Final   Special Requests NONE  Final   Gram Stain   Final    FEW SQUAMOUS EPITHELIAL CELLS PRESENT MODERATE WBC PRESENT, PREDOMINANTLY PMN FEW GRAM POSITIVE RODS RARE GRAM POSITIVE COCCI RARE GRAM NEGATIVE RODS Performed at [facility], [place] [address] [place], [place] [address]    Culture   Final    FEW CITROBACTER KOSERI RARE STAPHYLOCOCCUS AUREUS    Report Status 07/20/2019 FINAL  Final   Organism ID, Bacteria CITROBACTER KOSERI  Final   Organism ID, Bacteria STAPHYLOCOCCUS AUREUS  Final      Susceptibility   Citrobacter koseri - MIC*    CEFAZOLIN <=4 SENSITIVE Sensitive     CEFEPIME <=1 SENSITIVE Sensitive     CEFTAZIDIME <=1 SENSITIVE Sensitive     CEFTRIAXONE <=1 SENSITIVE Sensitive      CIPROFLOXACIN <=0.25 SENSITIVE Sensitive     GENTAMICIN <=1 SENSITIVE Sensitive     IMIPENEM <=0.25 SENSITIVE Sensitive     TRIMETH/SULFA <=20 SENSITIVE Sensitive     PIP/TAZO <=4 SENSITIVE Sensitive     * FEW CITROBACTER KOSERI   Staphylococcus aureus - MIC*    CIPROFLOXACIN <=0.5 SENSITIVE Sensitive     ERYTHROMYCIN <=0.25 SENSITIVE Sensitive     GENTAMICIN <=0.5 SENSITIVE Sensitive     OXACILLIN 0.5 SENSITIVE Sensitive     TETRACYCLINE <=1 SENSITIVE Sensitive     VANCOMYCIN 1 SENSITIVE Sensitive     TRIMETH/SULFA <=10 SENSITIVE Sensitive     CLINDAMYCIN <=0.25 SENSITIVE Sensitive     RIFAMPIN <=0.5 SENSITIVE Sensitive     Inducible Clindamycin NEGATIVE Sensitive     * RARE STAPHYLOCOCCUS AUREUS  MRSA PCR Screening     Status: None   Collection Time: 07/21/19 10:52 AM   Specimen: Nasal Mucosa; Nasopharyngeal  Result Value Ref Range Status   MRSA by PCR NEGATIVE NEGATIVE Final    Comment:        The GeneXpert MRSA Assay (FDA approved for NASAL specimens only), is one component of a comprehensive MRSA colonization surveillance program. It is not intended to diagnose MRSA infection nor to guide or monitor treatment for MRSA infections. Performed at [facility], [place] [address], [place], [place] [address]          Radiology Studies: DG CHEST PORT 1 VIEW  Result Date: 07/22/2019 CLINICAL DATA:  73 year old male with shortness of breath. Repair of AAA in October with subsequent ICU stay including cardiac arrest. Anoxic brain injury. EXAM: PORTABLE CHEST 1 VIEW COMPARISON:  Portable chest 07/17/2019 and earlier. FINDINGS: Portable AP semi upright view at 0821 hours. Stable tracheostomy tube. Mildly lower lung volumes. Stable cardiomegaly and mediastinal contours. Allowing for portable technique the lungs are clear. Negative visible bowel gas pattern. No acute osseous abnormality identified. IMPRESSION: No acute cardiopulmonary abnormality. Electronically Signed   By: Genevie Ann M.D.   On: 07/22/2019 09:00        Scheduled Meds: . amLODipine  2.5 mg  Per J Tube Daily  . budesonide (PULMICORT) nebulizer solution  0.25 mg Nebulization BID  . Carbidopa-Levodopa ER  1 tablet Per Tube 5 X Daily  . chlorhexidine gluconate (MEDLINE KIT)  15 mL Mouth Rinse BID  . Chlorhexidine Gluconate Cloth  6 each Topical Daily  . collagenase   Topical Daily  . docusate  50 mg Per Tube Daily  . enoxaparin (LOVENOX) injection  40 mg Subcutaneous Q24H  . feeding supplement (OSMOLITE 1.5 CAL)  474 mL Per Tube TID  . feeding supplement (PRO-STAT SUGAR FREE 64)  30 mL Per Tube BID  . fluconazole  100 mg Per Tube Daily  . free water  400 mL Per Tube Q6H  . insulin aspart  0-15 Units Subcutaneous TID WC  . ipratropium-albuterol  3 mL Nebulization BID  . mouth rinse  15 mL Mouth Rinse 10 times per Marcin  . nutrition supplement (JUVEN)  1 packet Per Tube BID BM  . pantoprazole sodium  40 mg Per Tube Daily  . Pimavanserin Tartrate  34 mg Per Tube Daily  . rosuvastatin  10 mg Per Tube q1800   Continuous Infusions: . sodium chloride Stopped (06/10/19 0000)  . sodium chloride Stopped (07/20/19 0730)          Aline August, MD [facility] 07/24/2019, 8:09 AM

## 2019-07-24 NOTE — Progress Notes (Signed)
Notify provider, Follow up new order

## 2019-07-24 NOTE — Plan of Care (Signed)
  Problem: Clinical Measurements: Goal: Respiratory complications will improve Outcome: Progressing Note: Decreased secretions and reduced use of suctioning   Problem: Nutrition: Goal: Adequate nutrition will be maintained Outcome: Progressing Note: Bolus tube feedings tolerated well. Wife at bedside able to demonstrate appropriate administration of tube feedings and medications.    Problem: Coping: Goal: Level of anxiety will decrease Outcome: Progressing Note: Family able to verbalize teaching and use hands on demonstration    Problem: Skin Integrity: Goal: Risk for impaired skin integrity will decrease Outcome: Progressing Note: Frequent repositioning and wound care.

## 2019-07-25 LAB — CBC WITH DIFFERENTIAL/PLATELET
Abs Immature Granulocytes: 0.07 10*3/uL (ref 0.00–0.07)
Basophils Absolute: 0 10*3/uL (ref 0.0–0.1)
Basophils Relative: 0 %
Eosinophils Absolute: 0.4 10*3/uL (ref 0.0–0.5)
Eosinophils Relative: 4 %
HCT: 29.8 % — ABNORMAL LOW (ref 39.0–52.0)
Hemoglobin: 9.1 g/dL — ABNORMAL LOW (ref 13.0–17.0)
Immature Granulocytes: 1 %
Lymphocytes Relative: 15 %
Lymphs Abs: 1.6 10*3/uL (ref 0.7–4.0)
MCH: 26.3 pg (ref 26.0–34.0)
MCHC: 30.5 g/dL (ref 30.0–36.0)
MCV: 86.1 fL (ref 80.0–100.0)
Monocytes Absolute: 0.8 10*3/uL (ref 0.1–1.0)
Monocytes Relative: 7 %
Neutro Abs: 8.2 10*3/uL — ABNORMAL HIGH (ref 1.7–7.7)
Neutrophils Relative %: 73 %
Platelets: 178 10*3/uL (ref 150–400)
RBC: 3.46 MIL/uL — ABNORMAL LOW (ref 4.22–5.81)
RDW: 16.3 % — ABNORMAL HIGH (ref 11.5–15.5)
WBC: 11.1 10*3/uL — ABNORMAL HIGH (ref 4.0–10.5)
nRBC: 0 % (ref 0.0–0.2)

## 2019-07-25 LAB — COMPREHENSIVE METABOLIC PANEL
ALT: 16 U/L (ref 0–44)
AST: 64 U/L — ABNORMAL HIGH (ref 15–41)
Albumin: 2.2 g/dL — ABNORMAL LOW (ref 3.5–5.0)
Alkaline Phosphatase: 83 U/L (ref 38–126)
Anion gap: 11 (ref 5–15)
BUN: 70 mg/dL — ABNORMAL HIGH (ref 8–23)
CO2: 27 mmol/L (ref 22–32)
Calcium: 8.8 mg/dL — ABNORMAL LOW (ref 8.9–10.3)
Chloride: 109 mmol/L (ref 98–111)
Creatinine, Ser: 0.82 mg/dL (ref 0.61–1.24)
GFR calc Af Amer: >60 mL/min (ref >60)
GFR calc non Af Amer: >60 mL/min (ref >60)
Glucose, Bld: 117 mg/dL — ABNORMAL HIGH (ref 70–99)
Potassium: 4.6 mmol/L (ref 3.5–5.1)
Sodium: 147 mmol/L — ABNORMAL HIGH (ref 135–145)
Total Bilirubin: 0.6 mg/dL (ref 0.3–1.2)
Total Protein: 6.9 g/dL (ref 6.5–8.1)

## 2019-07-25 LAB — GLUCOSE, CAPILLARY
Glucose-Capillary: 116 mg/dL — ABNORMAL HIGH (ref 70–99)
Glucose-Capillary: 108 mg/dL — ABNORMAL HIGH (ref 70–99)

## 2019-07-25 LAB — MAGNESIUM: Magnesium: 2.4 mg/dL (ref 1.7–2.4)

## 2019-07-25 MED ORDER — PANTOPRAZOLE SODIUM 40 MG PO PACK: 40.0000 mg | PACK | Freq: Every day | ORAL | 0 refills | Status: AC

## 2019-07-25 MED ORDER — POLYETHYLENE GLYCOL 3350 17 G PO PACK: 17.0000 g | PACK | Freq: Every day | ORAL | 0 refills | Status: AC | PRN

## 2019-07-25 MED ORDER — CLOPIDOGREL BISULFATE 75 MG PO TABS: 75.0000 mg | ORAL_TABLET | Freq: Every day | ORAL | Status: AC

## 2019-07-25 MED ORDER — RYTARY 48.75-195 MG PO CPCR: 1.0000 | ORAL_CAPSULE | Freq: Every day | ORAL | Status: AC

## 2019-07-25 MED ORDER — COLLAGENASE 250 UNIT/GM EX OINT: TOPICAL_OINTMENT | Freq: Every day | CUTANEOUS | 0 refills | Status: AC

## 2019-07-25 MED ORDER — IPRATROPIUM-ALBUTEROL 0.5-2.5 (3) MG/3ML IN SOLN: 3.0000 mL | Freq: Two times a day (BID) | RESPIRATORY_TRACT | 0 refills | Status: AC

## 2019-07-25 MED ORDER — PRO-STAT SUGAR FREE PO LIQD: 30.0000 mL | Freq: Two times a day (BID) | ORAL | 0 refills | Status: AC

## 2019-07-25 MED ORDER — NUPLAZID 34 MG PO CAPS: 1.0000 | ORAL_CAPSULE | Freq: Every day | ORAL | Status: AC

## 2019-07-25 MED ORDER — BUDESONIDE 0.25 MG/2ML IN SUSP: 0.2500 mg | Freq: Two times a day (BID) | RESPIRATORY_TRACT | 0 refills | Status: AC

## 2019-07-25 MED ORDER — IPRATROPIUM-ALBUTEROL 0.5-2.5 (3) MG/3ML IN SOLN: 3.0000 mL | Freq: Four times a day (QID) | RESPIRATORY_TRACT | Status: AC | PRN

## 2019-07-25 MED ORDER — PYRIDOSTIGMINE BROMIDE 60 MG PO TABS: 60.0000 mg | ORAL_TABLET | ORAL | Status: AC

## 2019-07-25 MED ORDER — JUVEN PO PACK: 1.0000 | PACK | Freq: Two times a day (BID) | ORAL | 0 refills | Status: AC

## 2019-07-25 MED ORDER — FREE WATER: 400.0000 mL | Freq: Four times a day (QID) | 12 refills | Status: AC

## 2019-07-25 MED ORDER — GLYCOPYRROLATE 1 MG PO TABS: 1.0000 mg | ORAL_TABLET | Freq: Every day | ORAL | 0 refills | Status: AC | PRN

## 2019-07-25 MED ORDER — DOCUSATE SODIUM 50 MG/5ML PO LIQD: 50.0000 mg | Freq: Every day | ORAL | 0 refills | Status: AC

## 2019-07-25 MED ORDER — OSMOLITE 1.5 CAL PO LIQD: 474.0000 mL | Freq: Three times a day (TID) | ORAL | 12 refills | Status: AC

## 2019-07-25 MED ORDER — AMLODIPINE BESYLATE 2.5 MG PO TABS: 2.5000 mg | ORAL_TABLET | Freq: Every day | ORAL | 0 refills | Status: AC

## 2019-07-25 MED ORDER — ROSUVASTATIN CALCIUM 10 MG PO TABS: 10.0000 mg | ORAL_TABLET | Freq: Every day | ORAL | 0 refills | Status: AC

## 2019-07-25 NOTE — Progress Notes (Signed)
Oxygen was setup for patient to leave. Patient was also suctioned before leaving. Received moderate amounts of yellow sputum. Pt tolerated well.

## 2019-07-25 NOTE — Progress Notes (Signed)
All home meds from main pharmacy and pyxis are now with the patient in a white patient belongings bag.

## 2019-07-25 NOTE — TOC Transition Note (Signed)
Transition of Care [facility]) - CM/SW Discharge Note   Patient Details  Name: Derek Harrell MRN: [id number] Date of Birth: 09-02-1945  [facility] CM/SW Contact:  Zenon Mayo, RN Phone Number: 07/25/2019, 1:38 PM   Clinical Narrative:    Patient for dc home with hospice with [facility], NCM notified wife that he will be transported via ptar.  [facility] has been called. Ambulance forms on the chart.  He will be going home with 6 cartons of osmolite,  Per Dorian Pod with [facility] the director will go get the rest of the osmolite for patient.  Also RN went down to pharmacy to pick up the rest of his meds they were holding for him.  Wife has had education on suctioning the patient and she states she is good with doing that.     Final next level of care: Home w Hospice Care Barriers to Discharge: No Barriers Identified   Patient Goals and CMS Choice   CMS Medicare.gov Compare Post Acute Care list provided to:: Patient Represenative (must comment) Choice offered to / list presented to : Spouse  Discharge Placement                       Discharge Plan and Services In-house Referral: Clinical Social Work Discharge Planning Services: CM Consult            DME Arranged: (arranged by [place])         HH Arranged: RN [place] Agency: ([place])        Social Determinants of Health (SDOH) Interventions     Readmission Risk Interventions Readmission Risk Prevention Plan 06/03/2019  Transportation Screening Complete  PCP or Specialist Appt within 3-5 Days Complete  HRI or [place] Complete  Social Work Consult for [place] Planning/Counseling Complete  Palliative Care Screening Not Applicable  Medication Review Press photographer) Complete  Some recent data might be hidden

## 2019-07-25 NOTE — Discharge Summary (Signed)
Physician Discharge Summary  Derek Harrell [id number] DOB: [birth year] DOA: 06/06/2019  PCP: [name], MD  Admit date: 06/06/2019 Discharge date: 07/25/2019  Admitted From: Home Disposition: Home  Recommendations for Outpatient Follow-up:  1. Follow up with PCP in 1 week with repeat CBC/BMP 2. Follow-up with home hospice at earliest convenience.  Overall prognosis is very poor.  Currently still full code.  Consider changing CODE STATUS to DNR and full comfort measures 3. Follow-up with pulmonary/cardiology/neurology if family wants full scope of treatment. 4. Follow up in ED if symptoms worsen or new appear   Home Health: Home hospice Equipment/Devices: Trach collar/PEG tube  Discharge Condition: Poor CODE STATUS: Full Diet recommendation: PEG tube feeding as per dietary recommendations  Brief/Interim Summary: 73 year old male with history of Parkinson's dementia, hypertension, hyperlipidemia, OSA (did not tolerate CPAP), AAA, history of recent endovascular repair of aortic aneurysm 3 days prior to presentation on 06/02/2019, prior unspecified CVA, cellulitis, restless leg syndrome presented on 06/06/2019 with worsening weakness, mental status change, hypertension and fever.  He was admitted to ICU, developed third-degree heart block with agonal respirations with thick mucus suctioned requiring intubation.  He was extubated on 06/08/2019.  Transferred out to the floor on 06/09/2019.  On 06/10/2019 AM, he suffered cardiac arrest requiring ACLS and intubation.  He was transferred back to ICU.  Post rewarming, patient has had no improvement in mental status with MRI consistent with hypoxic encephalopathy.  He is status post tracheostomy/PEG tube placement on 06/24/2019.   He was transferred to [facility] service on 06/28/2019.  PCCM will keep following for tracheostomy and vent.  Palliative care has evaluated the patient.  Patient remains full code.  Patient is recently tolerating trach collar.   Patient is still unresponsive but currently hemodynamically stable and tolerating PEG tube feeding.  Overall prognosis is very poor.  He will be discharged home with home hospice once arrangements have been made.   Discharge Diagnoses:   Acute hypoxic respiratory failure status post intubation and subsequent tracheostomy on a ventilator Concern for aspiration event with ongoing issues with swallowing and secretion management Obstructive sleep apnea, noncompliant with treatment at baseline Status post PEG tube placement -Patient has had a long hospitalization and could not be liberated from the ventilator because of mental status changes.  Subsequently tracheostomy and PEG tube were placed on 06/24/2019.  Transferred to [facility] service on 06/28/2019.  PCCM following intermittently for trach management. -Patient is currently on trach collar and doing well with weaning since 07/18/2019. -Currently still requiring 5 L oxygen -Chest x-ray on 07/17/2019 showed no acute abnormality: He was started on cefepime on 07/17/2019 for excessive secretions but no history of fever or leukocytosis.  Cefepime was discontinued on 07/20/2019. -Continue tube feeding as per dietitian recommendations. -Patient  had low-grade temperature few days ago. Chest x-ray from 07/22/2019 showed no acute abnormality.  WBCs did not trend up significantly.  Monitor off antibiotics for now.  No temperature spikes subsequently. -Overall patient is guarded to poor but currently stable for discharge home. -Very high risk for any kind of infection including aspiration pneumonia. -We will discharge home with home hospice today.  Patient remains full code the.  Cardiac arrest, unclear etiology Intermittent bradycardia -Status post bradycardic arrest lasting for about 12 minutes on 06/10/2019.  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 -Palliative care has had multiple discussions with the family.  Patient  remains full code.  Overall prognosis is poor.  Consider hospice/comfort measures. -I placed a reconsult for palliative care on 07/21/2019 as wife is leaning towards home hospice but patient still remains full code.  Palliative care reevaluation is pending.  This can be done as an outpatient. -Spoke to wife on 07/23/2019 at bedside and recommended that patient be made full comfort measures/full hospice including DNR.  She is not ready to make the decision yet.  Spoke with wife again on 07/25/2019 on phone about discharge planning home today. -Tube feeding can be continued at home.  Oral thrush -Started on Diflucan on 07/18/2019.    Completed course.  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  Outpatient follow-up with neurology if family wants full scope of care.  Hypertension  -Blood pressure stable.  Continue Norvasc  Acute on chronic anemia of chronic disease -Transfused 2 units of packed red cells on 06/26/2019.  Monitor hemoglobin.  Transfuse if hemoglobin less than 7.  Hemoglobin has been stable recently.  Hypernatremia -Monitor.  Sodium 147 today.  Continue free water 400 cc every 6 hours at home.  Monitor BMP as an outpatient.  Abdominal aortic aneurysm -Status post repair on 06/02/2019 by vascular surgery.  Continue Crestor  Pressure Injury 06/06/19 Sacrum Mid;Left Unstageable - Full thickness tissue loss in which the base of the ulcer is covered by slough (yellow, tan, gray, green or brown) and/or eschar (tan, brown or black) in the wound bed. unstageable when assessed on 1 (Active)  06/06/19 2032  Location: Sacrum  Location Orientation: Mid;Left  Staging: Unstageable - Full thickness tissue loss in which the base of the ulcer is covered by slough (yellow, tan, gray, green or brown) and/or eschar (tan, brown or  black) in the wound bed.  Wound Description (Comments): unstageable when assessed on 12/7  Present on Admission: Yes     Pressure Injury 07/07/19 Neck Mid Stage II -  Partial thickness loss of dermis presenting as a shallow open ulcer with a red, pink wound bed without slough. yellow shallow penny sized (Active)  07/07/19 1200  Location: Neck  Location Orientation: Mid  Staging: Stage II -  Partial thickness loss of dermis presenting as a shallow open ulcer with a red, pink wound bed without slough.  Wound Description (Comments): yellow shallow penny sized  Present on Admission:      Pressure Injury 07/20/19 Penis Lower Stage II -  Partial thickness loss of dermis presenting as a shallow open ulcer with a red, pink wound bed without slough.  (Active)  07/20/19 0340  Location: Penis  Location Orientation: Lower  Staging: Stage II -  Partial thickness loss of dermis presenting as a shallow open ulcer with a red, pink wound bed without slough.  Wound Description (Comments): -- (Open wound on lower base of penis)  Present on Admission: No    Discharge Instructions  Discharge Instructions    Ambulatory referral to Cardiology   Complete by: As directed    Ambulatory referral to Neurology   Complete by: As directed    An appointment is requested in approximately: few weeks. Anoxic encephalopathy/Parkinson's/Myasthenia   Ambulatory referral to Pulmonology   Complete by: As directed      Allergies as of 07/25/2019   No Known Allergies     Medication List    STOP taking these medications   aspirin 81 MG EC tablet   diphenhydramine-acetaminophen 25-500 MG Tabs tablet Commonly known as: TYLENOL PM  mirabegron ER 50 MG Tb24 tablet Commonly known as: Myrbetriq   nebivolol 10 MG tablet Commonly known as: BYSTOLIC   oxyCODONE 5 MG immediate release tablet Commonly known as: Oxy IR/ROXICODONE   vitamin B-12 1000 MCG tablet Commonly known as: CYANOCOBALAMIN     TAKE these  medications   amLODipine 2.5 MG tablet Commonly known as: NORVASC 1 tablet (2.5 mg total) by Per J Tube route daily.   budesonide 0.25 MG/2ML nebulizer solution Commonly known as: PULMICORT Take 2 mLs (0.25 mg total) by nebulization 2 (two) times daily.   clopidogrel 75 MG tablet Commonly known as: PLAVIX Place 1 tablet (75 mg total) into feeding tube daily. What changed: how to take this   collagenase ointment Commonly known as: SANTYL Apply topically daily. To sacral wound   docusate 50 MG/5ML liquid Commonly known as: COLACE Place 5 mLs (50 mg total) into feeding tube daily.   feeding supplement (OSMOLITE 1.5 CAL) Liqd Place 474 mLs into feeding tube 3 (three) times daily.   nutrition supplement (JUVEN) Pack Place 1 packet into feeding tube 2 (two) times daily between meals.   feeding supplement (PRO-STAT SUGAR FREE 64) Liqd Place 30 mLs into feeding tube 2 (two) times daily.   free water Soln Place 400 mLs into feeding tube every 6 (six) hours.   glycopyrrolate 1 MG tablet Commonly known as: ROBINUL Place 1 tablet (1 mg total) into feeding tube daily as needed (for increased secretions).   ipratropium 0.06 % nasal spray Commonly known as: ATROVENT PLACE 2 SPRAYS IN EACH NOSTRIL FOUR TIMES DAILY FOR NASAL CONGESTION What changed: See the new instructions.   ipratropium-albuterol 0.5-2.5 (3) MG/3ML Soln Commonly known as: DUONEB Take 3 mLs by nebulization 2 (two) times daily.   ipratropium-albuterol 0.5-2.5 (3) MG/3ML Soln Commonly known as: DUONEB Take 3 mLs by nebulization every 6 (six) hours as needed.   Nuplazid 34 MG Caps Generic drug: Pimavanserin Tartrate Place 1 capsule into feeding tube daily. What changed: how to take this   pantoprazole sodium 40 mg/20 mL Pack Commonly known as: PROTONIX Place 20 mLs (40 mg total) into feeding tube daily.   polyethylene glycol 17 g packet Commonly known as: MIRALAX / GLYCOLAX Place 17 g into feeding tube  daily as needed for mild constipation or moderate constipation.   pyridostigmine 60 MG tablet Commonly known as: MESTINON Place 1 tablet (60 mg total) into feeding tube See admin instructions. Take one tablet (60 mg) by mouth twice daily - 7am and 4pm What changed:   how much to take  how to take this  when to take this  additional instructions   rosuvastatin 10 MG tablet Commonly known as: CRESTOR Place 1 tablet (10 mg total) into feeding tube daily. What changed: See the new instructions.   Rytary 48.75-195 MG Cpcr Generic drug: Carbidopa-Levodopa ER Take 1 tablet by mouth 5 (five) times daily. 7am, 10am, 1pm, 4pm, 7pm What changed:   additional instructions  Another medication with the same name was removed. Continue taking this medication, and follow the directions you see here.      Follow-up Information    [name], MD. Schedule an appointment as soon as possible for a visit in 1 week(s).   Specialty: Internal Medicine Why: with repeat cbc/bmp  Contact information: [address] [place] [address] [phone]        Lorretta Harp, MD .   Specialties: Cardiology, Radiology Contact information: [address] [place]  [place] [address] [phone]        Palliative care Follow up.   Why: At earliest convenience         No Known Allergies  Consultations: PCCM/vascular surgery/palliative care/general surgery/cardiology/neurology   Procedures/Studies: DG CHEST PORT 1 VIEW  Result Date: 07/22/2019 CLINICAL DATA:  73 year old male with shortness of breath. Repair of AAA in October with subsequent ICU stay including cardiac arrest. Anoxic brain injury. EXAM: PORTABLE CHEST 1 VIEW COMPARISON:  Portable chest 07/17/2019 and earlier. FINDINGS: Portable AP semi upright view at 0821 hours. Stable tracheostomy tube. Mildly lower lung volumes. Stable cardiomegaly and mediastinal contours. Allowing for portable technique  the lungs are clear. Negative visible bowel gas pattern. No acute osseous abnormality identified. IMPRESSION: No acute cardiopulmonary abnormality. Electronically Signed   By: Genevie Ann M.D.   On: 07/22/2019 09:00   DG CHEST PORT 1 VIEW  Result Date: 07/17/2019 CLINICAL DATA:  Tracheitis EXAM: PORTABLE CHEST 1 VIEW COMPARISON:  07/10/2019 FINDINGS: Cardiomegaly. Tracheostomy. Both lungs are clear. The visualized skeletal structures are unremarkable. IMPRESSION: 1.  Tracheostomy without acute abnormality of the lungs. 2.  Cardiomegaly. Electronically Signed   By: Eddie Candle M.D.   On: 07/17/2019 13:53   DG CHEST PORT 1 VIEW  Result Date: 07/10/2019 CLINICAL DATA:  73 year old male with aortic aneurysm repair on 06/02/2019 with recent admission of altered mental status and subsequent cardiac arrest. Patient is intubated. EXAM: PORTABLE CHEST 1 VIEW COMPARISON:  Chest radiograph dated 06/24/2019. FINDINGS: Tracheostomy in similar position. Minimal bibasilar atelectasis. No focal consolidation, pleural effusion, or pneumothorax. Overall no significant interval change in the appearance of the lungs or cardiomediastinal silhouette since the prior radiograph. No acute osseous pathology. Air distended stomach partially visualized. IMPRESSION: No acute cardiopulmonary process. No interval change. Electronically Signed   By: Anner Crete M.D.   On: 07/10/2019 19:51       Subjective: Patient seen and examined at bedside.  Remains unresponsive.  No overnight fever, vomiting reported.  Discharge Exam: Vitals:   07/25/19 1100 07/25/19 1110  BP: 126/73   Pulse: 75 67  Resp: 18 18  Temp: 98.8 F (37.1 C)   SpO2: 98% 96%    General: Remains unresponsive.  Looks very deconditioned.  No distress.  Looks chronically ill.   Neck: Trach collar present  cardiovascular: rate controlled, S1/S2 + Respiratory: bilateral decreased breath sounds at bases with scattered crackles Abdominal: Soft, NT, ND, bowel  sounds +, PEG tube present Extremities: Trace lower extremity edema present, no cyanosis    The results of significant diagnostics from this hospitalization (including imaging, microbiology, ancillary and laboratory) are listed below for reference.     Microbiology: Recent Results (from the past 240 hour(s))  Culture, respiratory     Status: None   Collection Time: 07/17/19  3:03 PM   Specimen: SPU  Result Value Ref Range Status   Specimen Description SPUTUM DEEP SUCTION  Final   Special Requests NONE  Final   Gram Stain   Final    FEW SQUAMOUS EPITHELIAL CELLS PRESENT MODERATE WBC PRESENT, PREDOMINANTLY PMN FEW GRAM POSITIVE RODS RARE GRAM POSITIVE COCCI RARE GRAM NEGATIVE RODS Performed at [facility], [place] [address], [place], [place] [address]    Culture   Final    FEW CITROBACTER KOSERI RARE STAPHYLOCOCCUS AUREUS    Report Status 07/20/2019 FINAL  Final   Organism ID, Bacteria CITROBACTER KOSERI  Final   Organism ID, Bacteria STAPHYLOCOCCUS AUREUS  Final  Susceptibility   Citrobacter koseri - MIC*    CEFAZOLIN <=4 SENSITIVE Sensitive     CEFEPIME <=1 SENSITIVE Sensitive     CEFTAZIDIME <=1 SENSITIVE Sensitive     CEFTRIAXONE <=1 SENSITIVE Sensitive     CIPROFLOXACIN <=0.25 SENSITIVE Sensitive     GENTAMICIN <=1 SENSITIVE Sensitive     IMIPENEM <=0.25 SENSITIVE Sensitive     TRIMETH/SULFA <=20 SENSITIVE Sensitive     PIP/TAZO <=4 SENSITIVE Sensitive     * FEW CITROBACTER KOSERI   Staphylococcus aureus - MIC*    CIPROFLOXACIN <=0.5 SENSITIVE Sensitive     ERYTHROMYCIN <=0.25 SENSITIVE Sensitive     GENTAMICIN <=0.5 SENSITIVE Sensitive     OXACILLIN 0.5 SENSITIVE Sensitive     TETRACYCLINE <=1 SENSITIVE Sensitive     VANCOMYCIN 1 SENSITIVE Sensitive     TRIMETH/SULFA <=10 SENSITIVE Sensitive     CLINDAMYCIN <=0.25 SENSITIVE Sensitive     RIFAMPIN <=0.5 SENSITIVE Sensitive     Inducible Clindamycin NEGATIVE Sensitive     * RARE STAPHYLOCOCCUS AUREUS   MRSA PCR Screening     Status: None   Collection Time: 07/21/19 10:52 AM   Specimen: Nasal Mucosa; Nasopharyngeal  Result Value Ref Range Status   MRSA by PCR NEGATIVE NEGATIVE Final    Comment:        The GeneXpert MRSA Assay (FDA approved for NASAL specimens only), is one component of a comprehensive MRSA colonization surveillance program. It is not intended to diagnose MRSA infection nor to guide or monitor treatment for MRSA infections. Performed at [facility], [place] [address], [place], [place] [address]      Labs: BNP (last 3 results) Recent Labs    06/10/19 1429  BNP [id number]*   Basic Metabolic Panel: Recent Labs  Lab 07/19/19 0709 07/22/19 0843 07/23/19 0252 07/24/19 0425 07/25/19 0435  NA 145 147* 147* 146* 147*  K 4.4 4.6 5.1 4.8 4.6  CL 110 111 110 109 109  CO2 26 25 27 26 27   GLUCOSE 102* 104* 117* 120* 117*  BUN 55* 55* 68* 72* 70*  CREATININE 0.69 0.71 0.77 0.83 0.82  CALCIUM 9.1 9.2 9.3 8.9 8.8*  MG 2.3 2.2 2.3 2.3 2.4  PHOS 4.1  --   --   --   --    Liver Function Tests: Recent Labs  Lab 07/22/19 0843 07/25/19 0435  AST 77* 64*  ALT 16 16  ALKPHOS 93 83  BILITOT 0.4 0.6  PROT 7.1 6.9  ALBUMIN 2.2* 2.2*   No results for input(s): LIPASE, AMYLASE in the last 168 hours. No results for input(s): AMMONIA in the last 168 hours. CBC: Recent Labs  Lab 07/19/19 0709 07/22/19 0843 07/23/19 0252 07/24/19 0425 07/25/19 0435  WBC 10.8* 10.9* 12.0* 13.3* 11.1*  NEUTROABS  --  7.9* 8.8* 10.1* 8.2*  HGB 9.5* 9.2* 9.7* 9.3* 9.1*  HCT 31.0* 30.3* 31.8* 29.8* 29.8*  MCV 86.1 85.1 85.5 84.2 86.1  PLT 272 251 255 208 178   Cardiac Enzymes: No results for input(s): CKTOTAL, CKMB, CKMBINDEX, TROPONINI in the last 168 hours. BNP: Invalid input(s): POCBNP CBG: Recent Labs  Lab 07/24/19 1632 07/24/19 1831 07/24/19 2119 07/25/19 0622 07/25/19 1102  GLUCAP 133* 107* 229* 116* 108*   D-Dimer No results for input(s): DDIMER in the last  72 hours. Hgb A1c No results for input(s): HGBA1C in the last 72 hours. Lipid Profile No results for input(s): CHOL, HDL, LDLCALC, TRIG, CHOLHDL, LDLDIRECT in the last 72 hours.  Thyroid function studies No results for input(s): TSH, T4TOTAL, T3FREE, THYROIDAB in the last 72 hours.  Invalid input(s): FREET3 Anemia work up No results for input(s): VITAMINB12, FOLATE, FERRITIN, TIBC, IRON, RETICCTPCT in the last 72 hours. Urinalysis    Component Value Date/Time   COLORURINE YELLOW 06/06/2019 1148   APPEARANCEUR HAZY (A) 06/06/2019 1148   LABSPEC 1.018 06/06/2019 1148   PHURINE 5.0 06/06/2019 1148   GLUCOSEU NEGATIVE 06/06/2019 1148   GLUCOSEU NEGATIVE 04/13/2019 1539   HGBUR SMALL (A) 06/06/2019 1148   BILIRUBINUR NEGATIVE 06/06/2019 1148   BILIRUBINUR negative 04/30/2019 1012   KETONESUR 5 (A) 06/06/2019 1148   PROTEINUR 30 (A) 06/06/2019 1148   UROBILINOGEN 2.0 (A) 04/30/2019 1012   UROBILINOGEN 2.0 (A) 04/13/2019 1539   NITRITE NEGATIVE 06/06/2019 1148   LEUKOCYTESUR NEGATIVE 06/06/2019 1148   Sepsis Labs Invalid input(s): PROCALCITONIN,  WBC,  LACTICIDVEN Microbiology Recent Results (from the past 240 hour(s))  Culture, respiratory     Status: None   Collection Time: 07/17/19  3:03 PM   Specimen: SPU  Result Value Ref Range Status   Specimen Description SPUTUM DEEP SUCTION  Final   Special Requests NONE  Final   Gram Stain   Final    FEW SQUAMOUS EPITHELIAL CELLS PRESENT MODERATE WBC PRESENT, PREDOMINANTLY PMN FEW GRAM POSITIVE RODS RARE GRAM POSITIVE COCCI RARE GRAM NEGATIVE RODS Performed at [facility], [place] [address] [place], [place] [address]    Culture   Final    FEW CITROBACTER KOSERI RARE STAPHYLOCOCCUS AUREUS    Report Status 07/20/2019 FINAL  Final   Organism ID, Bacteria CITROBACTER KOSERI  Final   Organism ID, Bacteria STAPHYLOCOCCUS AUREUS  Final      Susceptibility   Citrobacter koseri - MIC*    CEFAZOLIN <=4 SENSITIVE Sensitive      CEFEPIME <=1 SENSITIVE Sensitive     CEFTAZIDIME <=1 SENSITIVE Sensitive     CEFTRIAXONE <=1 SENSITIVE Sensitive     CIPROFLOXACIN <=0.25 SENSITIVE Sensitive     GENTAMICIN <=1 SENSITIVE Sensitive     IMIPENEM <=0.25 SENSITIVE Sensitive     TRIMETH/SULFA <=20 SENSITIVE Sensitive     PIP/TAZO <=4 SENSITIVE Sensitive     * FEW CITROBACTER KOSERI   Staphylococcus aureus - MIC*    CIPROFLOXACIN <=0.5 SENSITIVE Sensitive     ERYTHROMYCIN <=0.25 SENSITIVE Sensitive     GENTAMICIN <=0.5 SENSITIVE Sensitive     OXACILLIN 0.5 SENSITIVE Sensitive     TETRACYCLINE <=1 SENSITIVE Sensitive     VANCOMYCIN 1 SENSITIVE Sensitive     TRIMETH/SULFA <=10 SENSITIVE Sensitive     CLINDAMYCIN <=0.25 SENSITIVE Sensitive     RIFAMPIN <=0.5 SENSITIVE Sensitive     Inducible Clindamycin NEGATIVE Sensitive     * RARE STAPHYLOCOCCUS AUREUS  MRSA PCR Screening     Status: None   Collection Time: 07/21/19 10:52 AM   Specimen: Nasal Mucosa; Nasopharyngeal  Result Value Ref Range Status   MRSA by PCR NEGATIVE NEGATIVE Final    Comment:        The GeneXpert MRSA Assay (FDA approved for NASAL specimens only), is one component of a comprehensive MRSA colonization surveillance program. It is not intended to diagnose MRSA infection nor to guide or monitor treatment for MRSA infections. Performed at [facility], [place] [address], [place], [place] [address]      Time coordinating discharge: 35 minutes  SIGNED:   Aline August, MD  [facility] 07/25/2019, 11:41 AM

## 2019-07-25 NOTE — Progress Notes (Signed)
Reassessed respirations and obtained a count of 22 per minute. Level of consciousness has not changed. There is no acute change.

## 2019-07-25 NOTE — Progress Notes (Addendum)
Nutrition Follow-up  RD working remotely.  DOCUMENTATION CODES:   Not applicable  INTERVENTION:   ContinueTube feeding:  Bolus feedings of Osmolite 1.5 474 mL (2cans) TID Pro-Stat 30 mL BID Provides 2330 kcals, 119 g of protein and 1086 mL of free water  ContinueJuven BID, each packet provides 80 calories, 8 grams of carbohydrate, 2.5 grams of protein (collagen), 7 grams of L-arginine and 7 grams of L-glutamine; supplement contains CaHMB, Vitamins C, E, B12 and Zinc to promote wound healing  NUTRITION DIAGNOSIS:   Inadequate oral intake related to inability to eat as evidenced by NPO status.  Ongoing  GOAL:   Patient will meet greater than or equal to 90% of their needs  Met with TF  MONITOR:   Labs, Weight trends, Skin, I & O's, Other (Comment), TF tolerance(GOC)  REASON FOR ASSESSMENT:   Ventilator, Consult Enteral/tube feeding initiation and management  ASSESSMENT:   73 yo male admitted with worsening weakness, MS change, HTN, fever. PMH includes HTN, AAA, HLD, CVD, obesity, stroke, myasthenia gravis, RLS, Parkinson's disease, dementia.  11/02 - admit 11/03 - Cortrak placed, tip gastric per Cortrak team 11/04 - extubated 11/06 - cardiac arrest, reintubated, TTM 36 11/20 - Trach and PEG placed  Reviewed I/O's: -352 ml x 24 hours and -5.8 L since 07/11/19  UOP: 1.4 L x 24 hours  Pt remains on trach collar. Per MD, pt with poor prognosis and recommending transition to comfort care, however, pt wife not ready to make decision yet.   Pt continues to tolerate Osmolite 1.5 boluses via G-tube. Receiving 400 ml free water flushes every 6 hours.  RNCM contact RD for assistance with home TF orders, as pt will be discharge home today. Per RNCM, home health is requesting that pt have a 3 [place] supply of TF fomula for discharge. RNCM stated that unit currently only has 6 cartons of TF and is requesting RD assistance in obtaining more TF formula for pt. Informed RNCM  that RD office does not have any formula stock or samples. RNCM requested that this RD contact pharmacy to send up additional formula.   RD called pharmacy and spoke with technician Kalman Shan), who was aware of this request. Per Rose, there are currently only 3 additional cans of Osmolite 1.5 in stock and additional supply is not scheduled to come today. Relayed this information to [facility], who suggested that RD change pt to a different formula so that formula request can be accommodated. Per Rose, there are about 90 cans of Osmolite 1.2 in stock, but request would need to be communicated with [facility], who would likely pull from main pharmacy stock to accommodate request.   Discussed information again with RNCM, who reported plan to send pt home with 6 available cans of Osmolite 1.5 from [employer] of home health agency will purchase additional supply for pt. Home TF regimen will not be altered for discharge, as needed formula was able to be supplied from the hospital and home health agency.    Labs reviewed: Na: 147, CBGS: 107-229.  Diet Order:   Diet Order            Diet NPO time specified  Diet effective now              EDUCATION NEEDS:   No education needs have been identified at this time  Skin:  Skin Assessment: Skin Integrity Issues: Skin Integrity Issues:: Unstageable Stage II: neck, penis Unstageable: sacrum Incisions: groin  Last BM:  07/23/19  Height:   Ht Readings from Last 1 Encounters:  06/06/19 6' (1.829 m)    Weight:   Wt Readings from Last 1 Encounters:  07/25/19 75.8 kg    Ideal Body Weight:  80.9 kg  BMI:  Body mass index is 22.68 kg/m.  Estimated Nutritional Needs:   Kcal:  2200-2400 kcals  Protein:  110-130 g  Fluid:  >/= 2 L    Nahomi Hegner A. Jimmye Norman, RD, LDN, [place] Registered Dietitian II Certified Diabetes Care and Education Specialist Pager: [phone] After hours Pager: [phone]

## 2019-07-26 ENCOUNTER — Telehealth: Payer: Self-pay | Admitting: *Deleted

## 2019-07-26 NOTE — Telephone Encounter (Signed)
Pt was on TCM report  admitted 06/06/19 to ICU, developed third-degree heart block with agonal respirations with thick mucus suctioned requiring intubation. He was extubated on 06/08/2019. Transferred out to the floor on 06/09/2019. On 06/10/2019 AM, he suffered cardiac arrest requiring ACLS and intubation. He was transferred back to ICU. Post rewarming, patient has had no improvement in mental status with MRI consistent with hypoxic encephalopathy. He is status post tracheostomy/PEG tube placement on 06/24/2019. He was transferred to [facility] service on 06/28/2019. PCCM will keep following for tracheostomy and vent. Palliative care has evaluated the patient. Patient remains full code. Patient is recently tolerating trach collar.  Patient is still unresponsive but currently hemodynamically stable and tolerating PEG tube feeding.  Overall prognosis is very poor.  Pt discharged 07/25/19 with home hospice.Derek KitchenJohny Chess

## 2019-08-22 ENCOUNTER — Other Ambulatory Visit: Payer: Self-pay | Admitting: Neurology

## 2019-08-23 ENCOUNTER — Ambulatory Visit: Payer: Medicare Other | Admitting: Neurology

## 2019-09-05 DEATH — deceased

## 2020-01-26 IMAGING — DX DG CHEST 1V PORT
1 series · 1 of 1 positions shown · non-contrast
Comparison: 06/06/2019

CLINICAL DATA: Post code, intubation, OG placement

EXAM:
PORTABLE CHEST 1 VIEW

[chest]
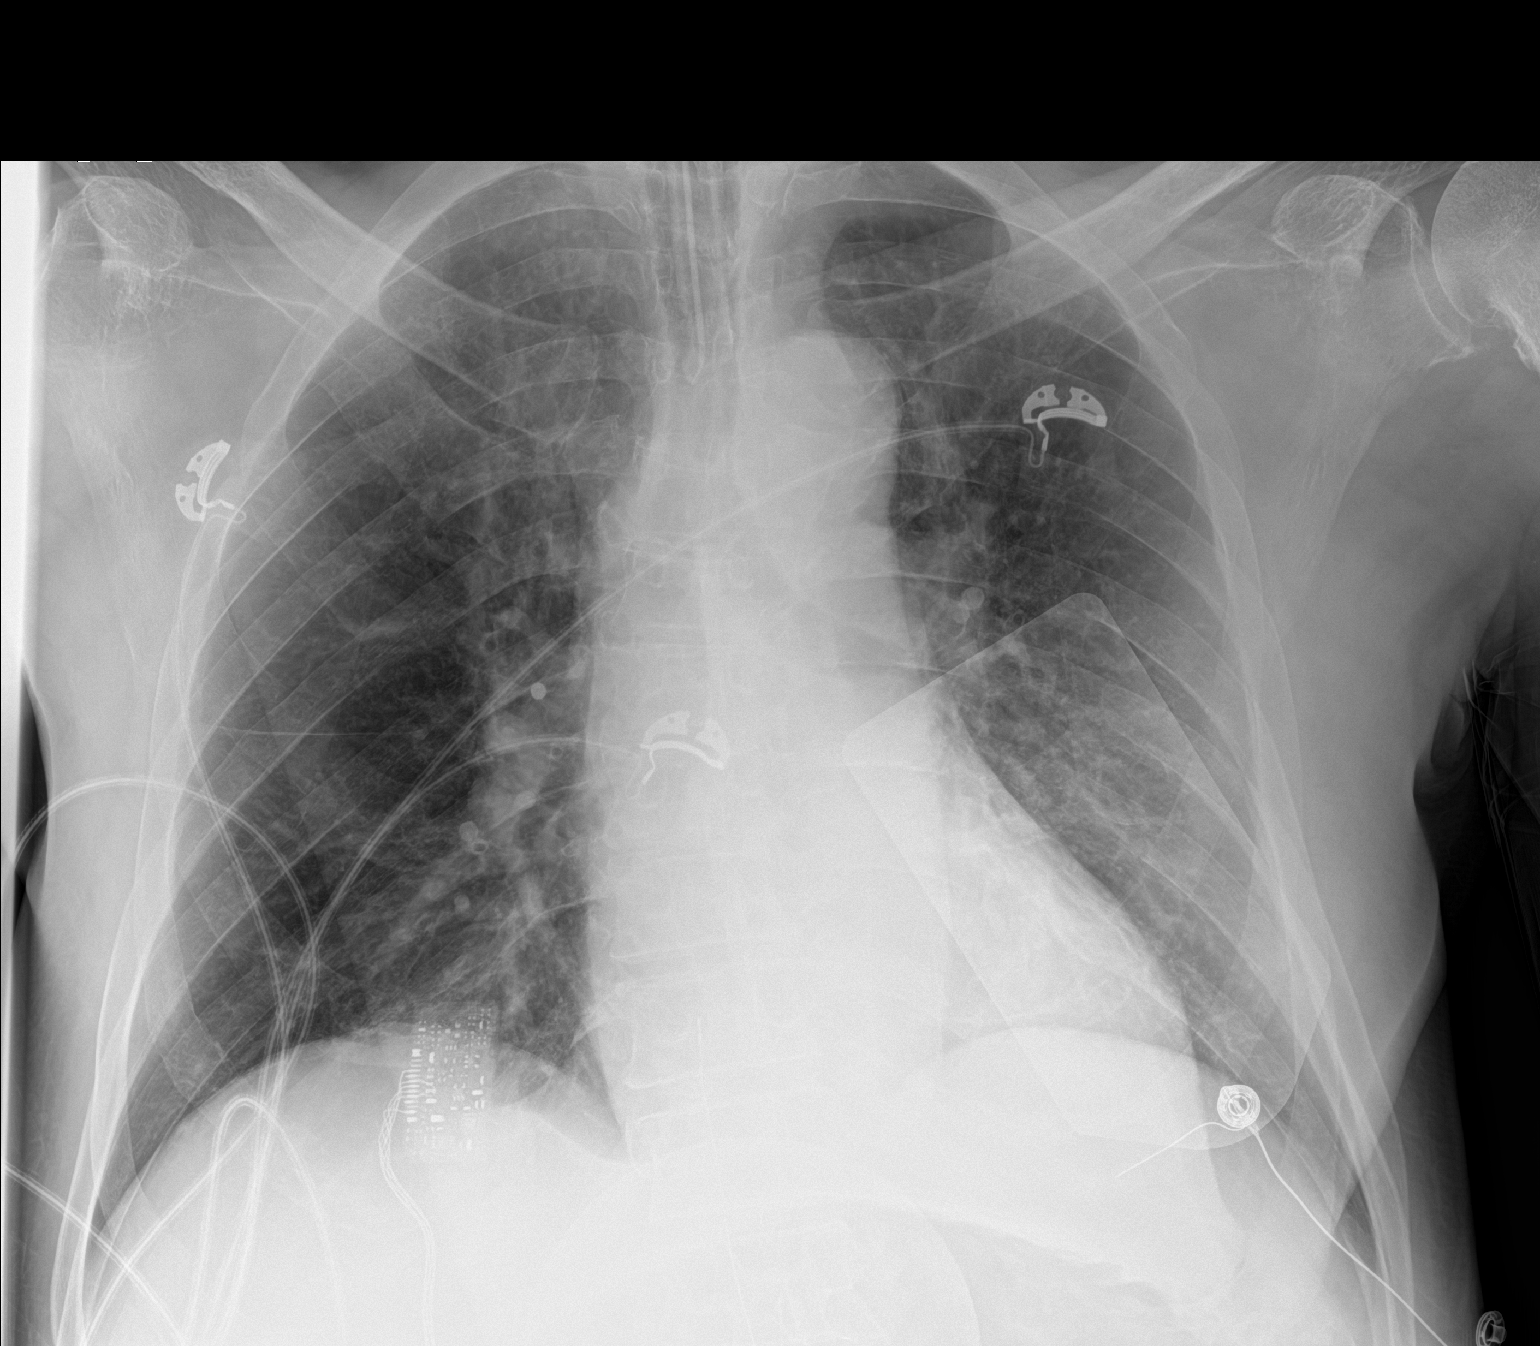

[1 of 1 positions shown; findings below may reference images not displayed]

FINDINGS: Endotracheal tube is 4 cm above the carina. OG tube is not
visualized. Heart is normal size. Lungs clear. No effusions. No
acute bony abnormality.
IMPRESSION: Endotracheal tube 4 cm above the carina.  No visible OG tube.

No acute cardiopulmonary disease.

## 2020-02-10 IMAGING — DX DG CHEST 1V PORT
1 series · 1 of 1 positions shown · non-contrast
Comparison: 06/18/2019

CLINICAL DATA: 73-year-old with respiratory failure.

EXAM:
PORTABLE CHEST 1 VIEW

[chest]
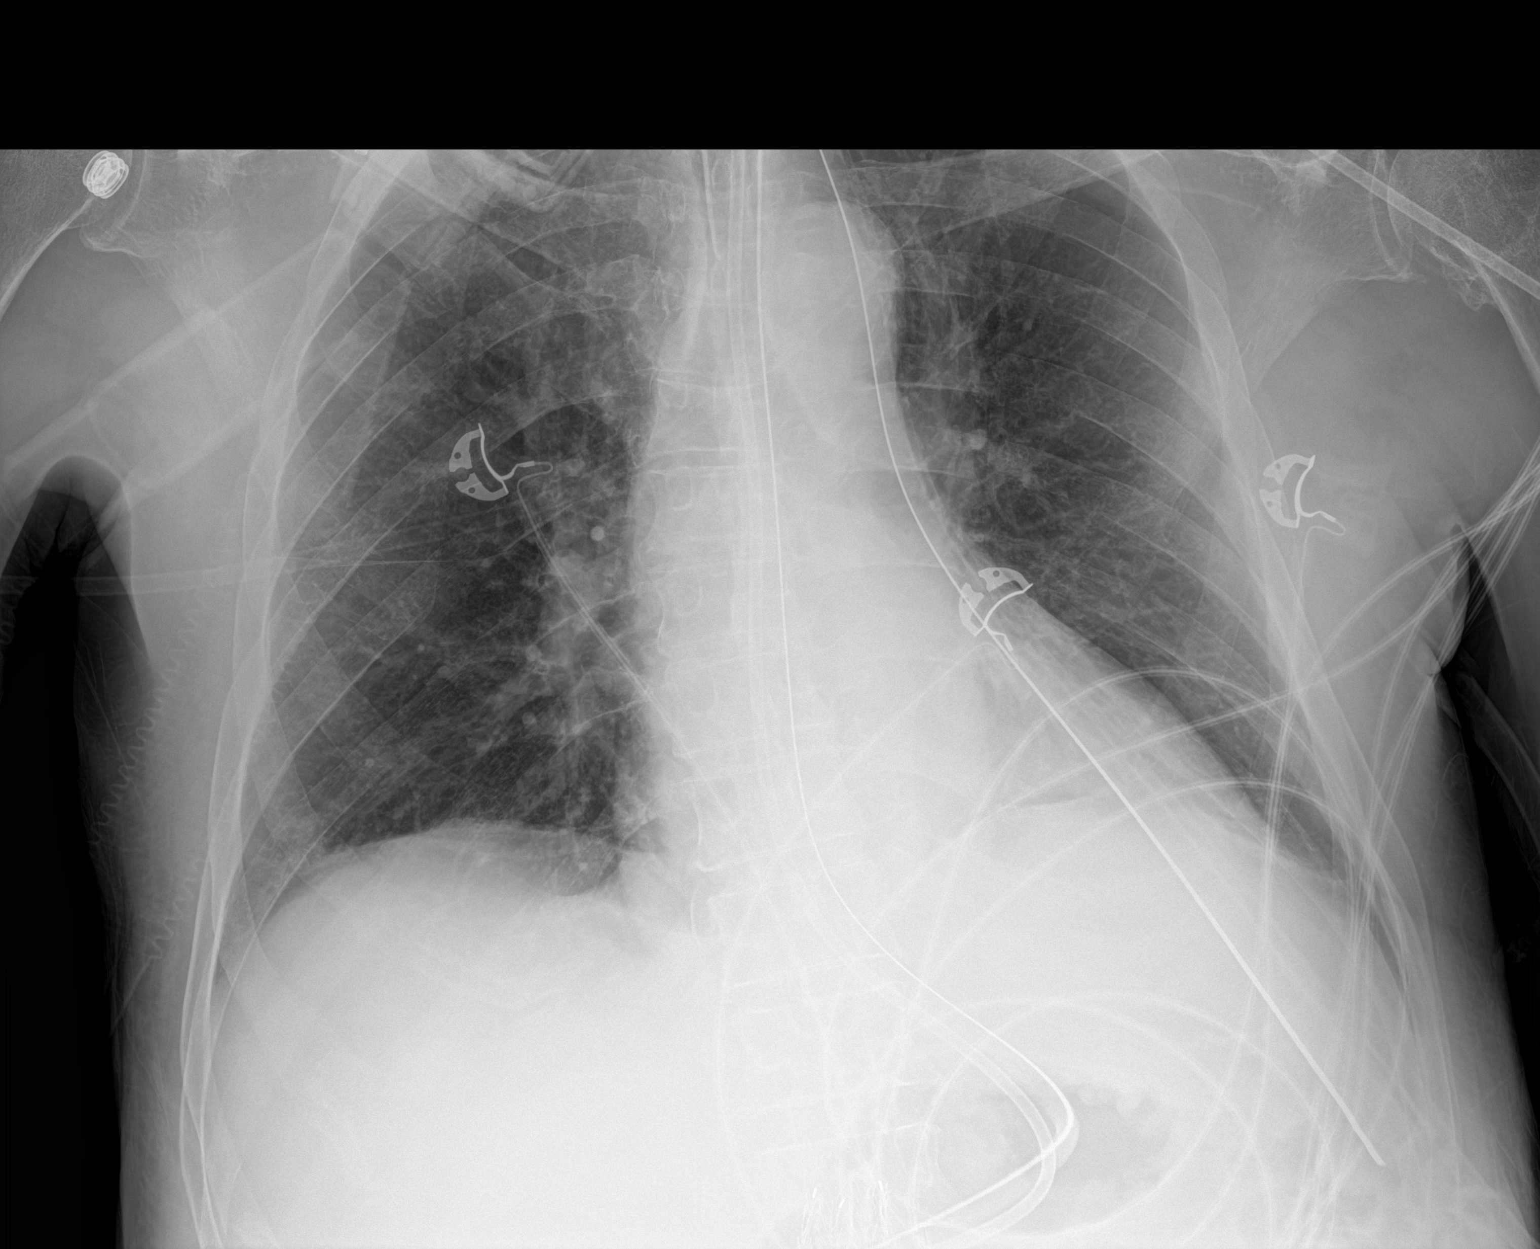

[1 of 1 positions shown; findings below may reference images not displayed]

FINDINGS: Endotracheal tube is 3.9 cm above the carina. Feeding tube and
nasogastric tube extend into the abdomen but the tips of the tube
are beyond the image. Slightly improved aeration at the left lung
base. No focal airspace disease. No pulmonary edema. Negative for
pneumothorax.
IMPRESSION: 1. Slightly improved aeration at the left lung base.
2. Endotracheal tube is appropriately positioned. Feeding tube and
nasogastric tube are present.

## 2020-02-12 IMAGING — DX DG CHEST 1V PORT
1 series · 1 of 1 positions shown · non-contrast
Comparison: Radiograph earlier this day at 325 hour

CLINICAL DATA: Respiratory failure.

EXAM:
PORTABLE CHEST 1 VIEW

[chest]
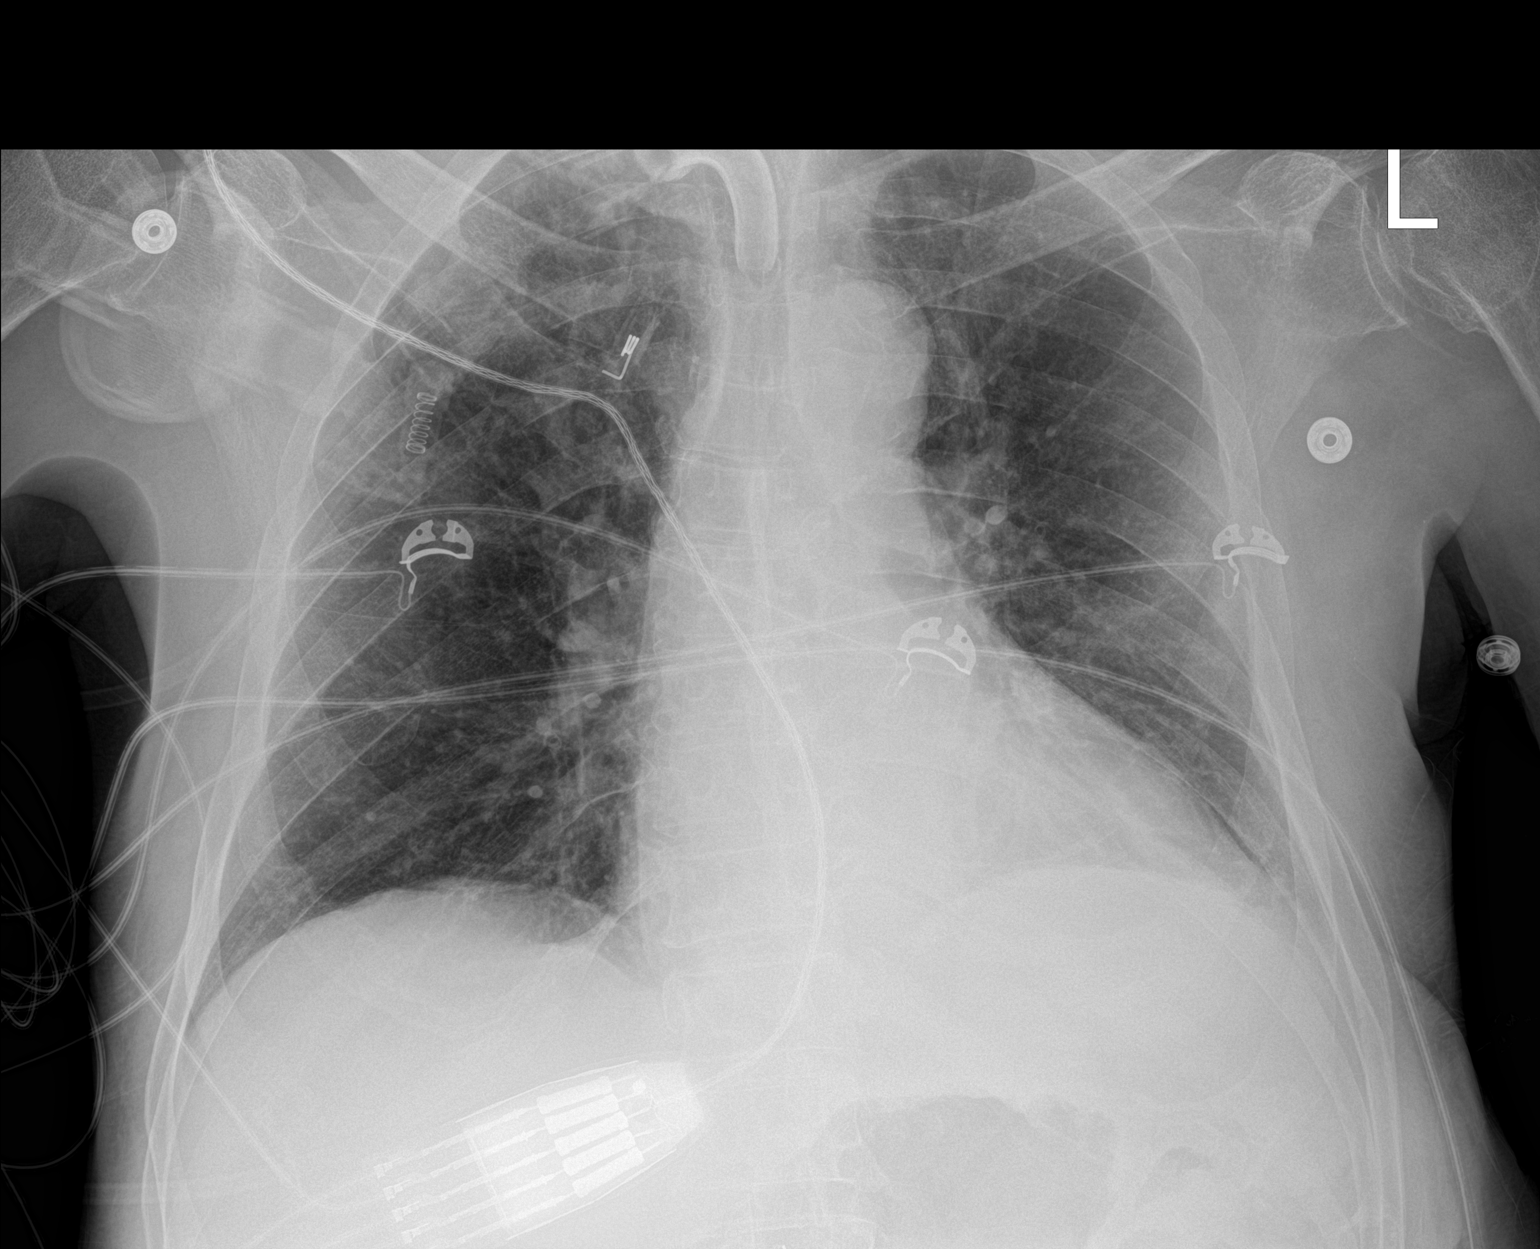

[1 of 1 positions shown; findings below may reference images not displayed]

FINDINGS: Tracheostomy tube at the thoracic inlet. Normal heart size with
unchanged mediastinal contours. No pulmonary edema, pleural effusion
or pneumothorax. Mild left lung base atelectasis is increased from
prior. Multiple overlying monitoring devices. No acute osseous
abnormalities are seen.
IMPRESSION: 1. Increasing left lung base atelectasis.
2. Tracheostomy tube tip at the thoracic inlet.

## 2020-03-06 IMAGING — DX DG CHEST 1V PORT
2 series · 2 of 2 positions shown · non-contrast
Comparison: 07/10/2019

CLINICAL DATA: Tracheitis

EXAM:
PORTABLE CHEST 1 VIEW

[chest ap (1 of 2)]
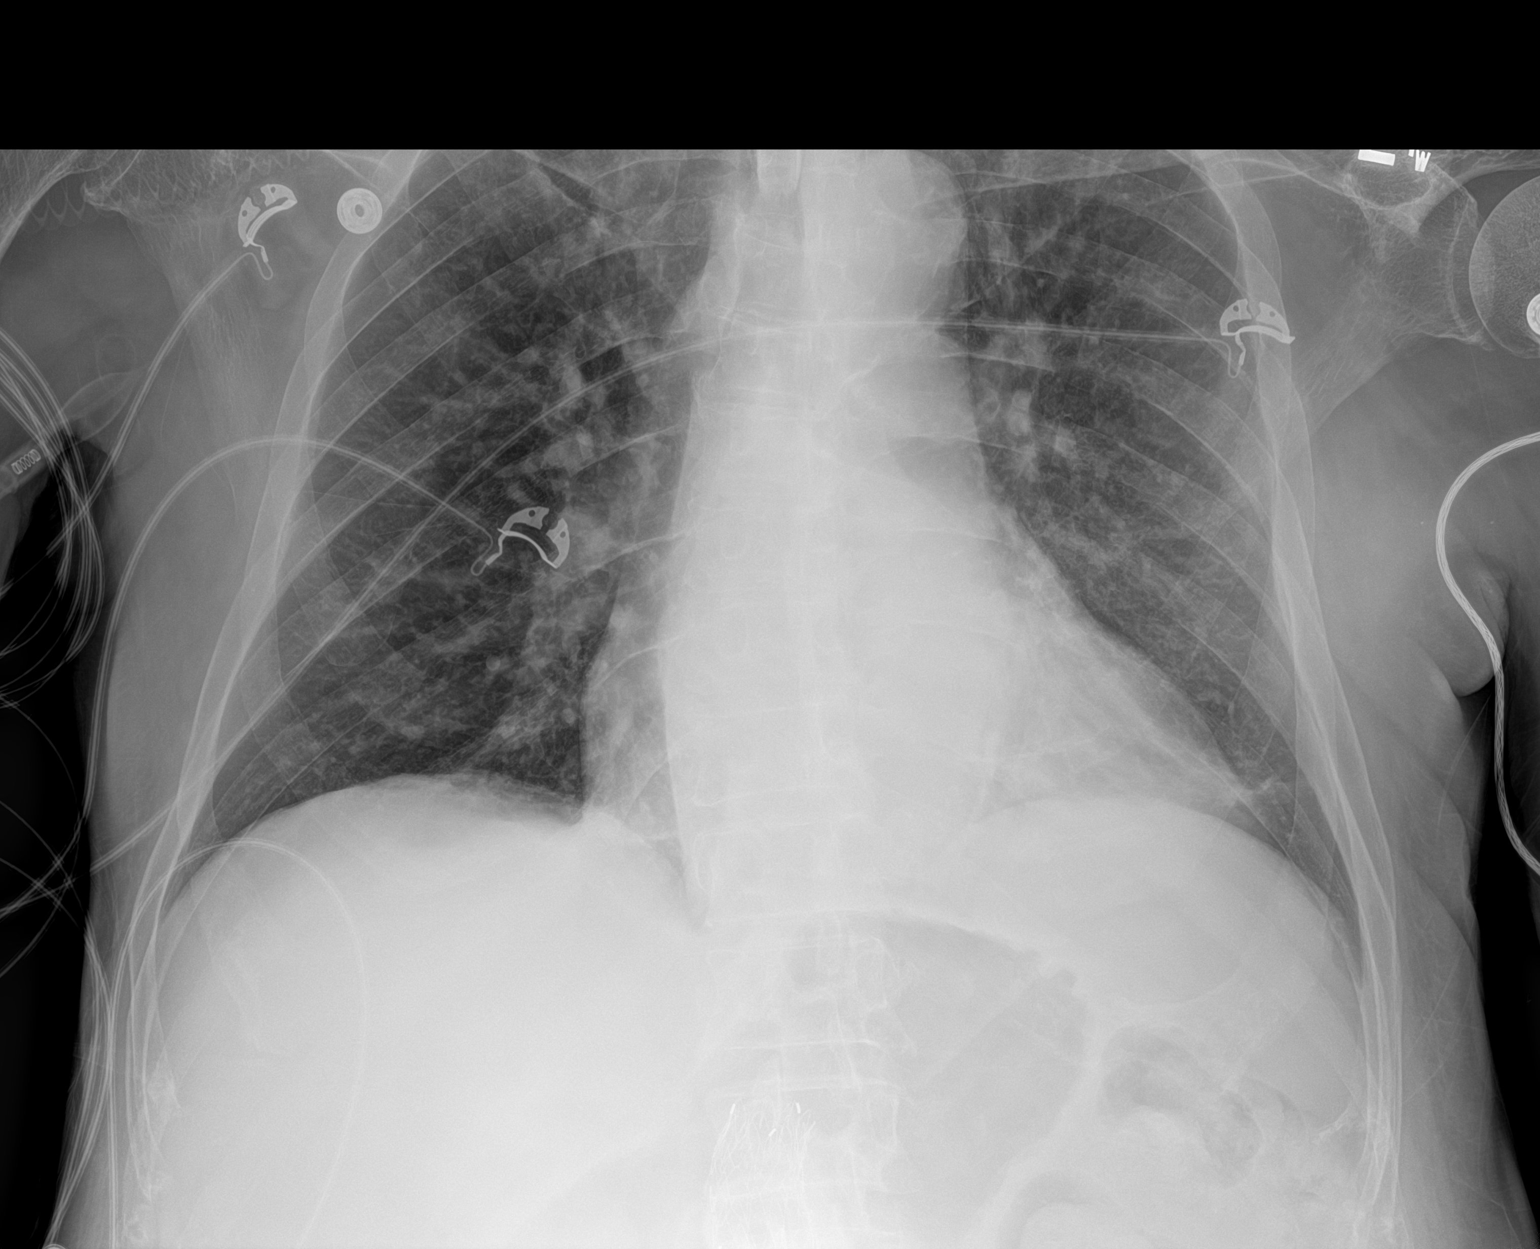

[chest ap (2 of 2)]
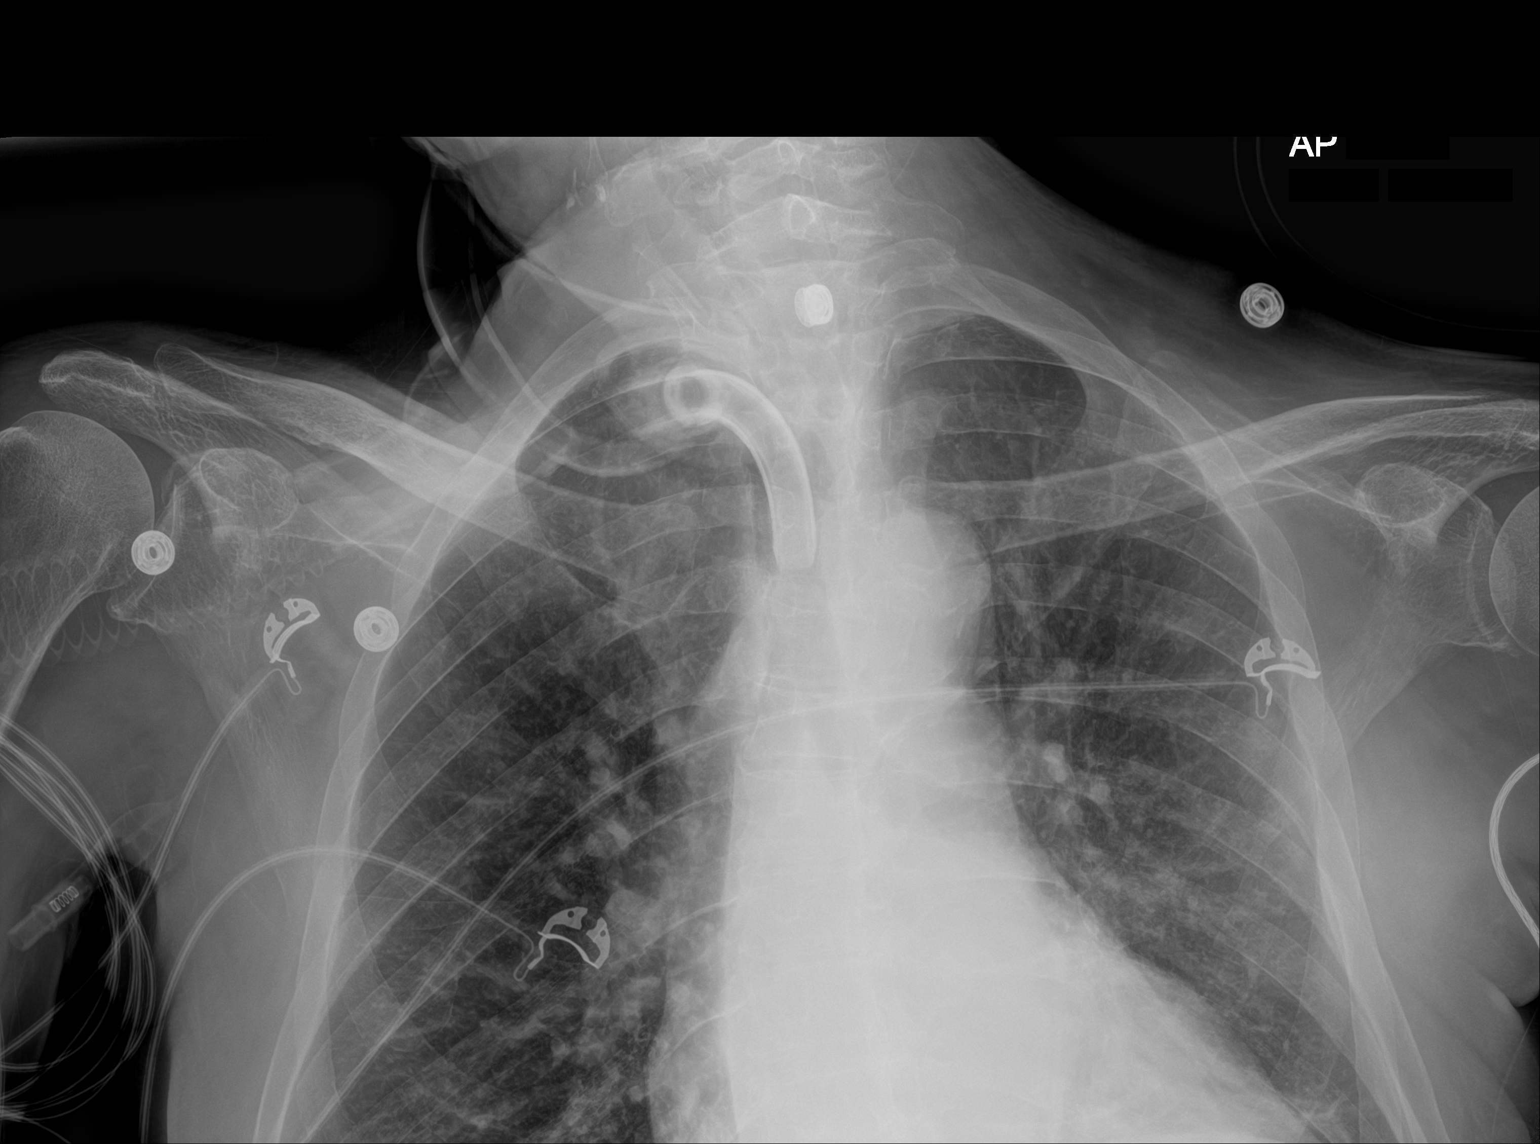

[2 of 2 positions shown; findings below may reference images not displayed]

FINDINGS: Cardiomegaly. Tracheostomy. Both lungs are clear. The visualized
skeletal structures are unremarkable.
IMPRESSION: 1.  Tracheostomy without acute abnormality of the lungs.

2.  Cardiomegaly.
